# Patient Record
Sex: Male | Born: 1960 | Race: Black or African American | Hispanic: No | Marital: Single | Smoking: Former smoker
Health system: Southern US, Community
[De-identification: ages and names within clinical notes are randomized; demographics above are authoritative.]

## PROBLEM LIST (undated history)

## (undated) DIAGNOSIS — Z9289 Personal history of other medical treatment: Secondary | ICD-10-CM

## (undated) DIAGNOSIS — E1351 Other specified diabetes mellitus with diabetic peripheral angiopathy without gangrene: Secondary | ICD-10-CM

## (undated) DIAGNOSIS — I6529 Occlusion and stenosis of unspecified carotid artery: Secondary | ICD-10-CM

## (undated) DIAGNOSIS — C801 Malignant (primary) neoplasm, unspecified: Secondary | ICD-10-CM

## (undated) DIAGNOSIS — R531 Weakness: Secondary | ICD-10-CM

## (undated) DIAGNOSIS — E785 Hyperlipidemia, unspecified: Secondary | ICD-10-CM

## (undated) DIAGNOSIS — K315 Obstruction of duodenum: Secondary | ICD-10-CM

## (undated) DIAGNOSIS — I429 Cardiomyopathy, unspecified: Secondary | ICD-10-CM

## (undated) DIAGNOSIS — I2510 Atherosclerotic heart disease of native coronary artery without angina pectoris: Secondary | ICD-10-CM

## (undated) DIAGNOSIS — I5042 Chronic combined systolic (congestive) and diastolic (congestive) heart failure: Secondary | ICD-10-CM

## (undated) DIAGNOSIS — I10 Essential (primary) hypertension: Secondary | ICD-10-CM

## (undated) HISTORY — DX: Hyperlipidemia, unspecified: E78.5

## (undated) HISTORY — DX: Chronic combined systolic (congestive) and diastolic (congestive) heart failure: I50.42

## (undated) HISTORY — DX: Personal history of other medical treatment: Z92.89

## (undated) HISTORY — DX: Other specified diabetes mellitus with diabetic peripheral angiopathy without gangrene: E13.51

## (undated) HISTORY — DX: Cardiomyopathy, unspecified: I42.9

## (undated) HISTORY — PX: MULTIPLE TOOTH EXTRACTIONS: SHX2053

## (undated) HISTORY — DX: Occlusion and stenosis of unspecified carotid artery: I65.29

---

## 1997-08-20 ENCOUNTER — Other Ambulatory Visit: Admission: RE | Admit: 1997-08-20 | Discharge: 1997-08-20 | Payer: Self-pay | Admitting: Family Medicine

## 1997-08-23 ENCOUNTER — Emergency Department (HOSPITAL_COMMUNITY): Admission: EM | Admit: 1997-08-23 | Discharge: 1997-08-23 | Payer: Self-pay | Admitting: Emergency Medicine

## 2004-06-21 ENCOUNTER — Emergency Department (HOSPITAL_COMMUNITY): Admission: EM | Admit: 2004-06-21 | Discharge: 2004-06-21 | Payer: Self-pay | Admitting: Emergency Medicine

## 2012-01-16 ENCOUNTER — Encounter (HOSPITAL_COMMUNITY): Payer: Self-pay | Admitting: *Deleted

## 2012-01-16 ENCOUNTER — Emergency Department (HOSPITAL_COMMUNITY)
Admission: EM | Admit: 2012-01-16 | Discharge: 2012-01-16 | Disposition: A | Discharge status: Home / Self Care | Payer: BC Managed Care – PPO | Source: Home / Self Care

## 2012-01-16 DIAGNOSIS — R51 Headache: Secondary | ICD-10-CM

## 2012-01-16 LAB — POCT I-STAT, CHEM 8
HCT: 49 % (ref 39.0–52.0)
Hemoglobin: 16.7 g/dL (ref 13.0–17.0)
Sodium: 140 mEq/L (ref 135–145)
TCO2: 23 mmol/L (ref 0–100)

## 2012-01-16 NOTE — ED Notes (Signed)
Pt is  A  Diabetic  He  Reports  Symptoms  Of  Headache  As  Well  As   intermttiant  dizzyness     He  Reports  Symptoms  Off  And  On  For  sev  Months      He   Reports  He  Vomited 2  Days  Ago  He  denys  Any   Chest pain or  Any  Shortness  Of  Breath  He  Says  He  Took  His  meds  Today  But  Not  yesterday

## 2015-02-03 ENCOUNTER — Encounter (HOSPITAL_BASED_OUTPATIENT_CLINIC_OR_DEPARTMENT_OTHER): Payer: Self-pay | Attending: Internal Medicine

## 2015-05-10 ENCOUNTER — Encounter (HOSPITAL_COMMUNITY): Payer: Self-pay | Admitting: *Deleted

## 2015-05-10 ENCOUNTER — Emergency Department (HOSPITAL_COMMUNITY)
Admission: EM | Admit: 2015-05-10 | Discharge: 2015-05-10 | Disposition: A | Discharge status: Home / Self Care | Payer: BLUE CROSS/BLUE SHIELD | Source: Home / Self Care | Attending: Emergency Medicine | Admitting: Emergency Medicine

## 2015-05-10 DIAGNOSIS — L03115 Cellulitis of right lower limb: Secondary | ICD-10-CM | POA: Diagnosis not present

## 2015-05-10 MED ORDER — DOXYCYCLINE HYCLATE 100 MG PO CAPS: 100.0000 mg | ORAL_CAPSULE | Freq: Two times a day (BID) | ORAL | Status: DC

## 2015-05-10 MED ORDER — AMOXICILLIN 500 MG PO CAPS: 500.0000 mg | ORAL_CAPSULE | Freq: Three times a day (TID) | ORAL | Status: DC

## 2015-05-10 MED ORDER — TRAMADOL HCL 50 MG PO TABS: 50.0000 mg | ORAL_TABLET | Freq: Four times a day (QID) | ORAL | Status: DC | PRN

## 2015-05-10 NOTE — ED Notes (Signed)
Legs   Are  Swollen    Blisters      And  Weeping       History   Of     Peripheral      Neuropathy           Symptoms  Of  Swelling   Have  Been  Present  For  A  While  But the  Blisters    Have  Been there  For  About  3  Days        pt is  A  Diabetic

## 2015-05-10 NOTE — ED Provider Notes (Signed)
CSN: [id number]     Arrival date & time 05/10/15  1321 History   First MD Initiated Contact with Patient 05/10/15 1408     Chief Complaint  Patient presents with  . Leg Swelling   (Consider location/radiation/quality/duration/timing/severity/associated sxs/prior Treatment) HPI  He is a 55 year old man here for evaluation of right foot swelling. He states he has had some problems with swelling of his feet since starting Lyrica about 6 weeks ago. In the last 2 days he has developed increased pain and blistering to the right dorsal ankle and foot. He also has some linear scabbing on the foot, but states this has been present for about 6 weeks. No fevers or chills. He is taking Cipro for a chronic left calf wound, that he states is healing. He has been on the Cipro he says for at least 3 months.  Past Medical History  Diagnosis Date  . Diabetes mellitus    History reviewed. No pertinent past surgical history. History reviewed. No pertinent family history. Social History  Substance Use Topics  . Smoking status: Current Every Day Smoker  . Smokeless tobacco: None  . Alcohol Use: No    Review of Systems As in history of present illness Allergies  Review of patient's allergies indicates no known allergies.  Home Medications   Prior to Admission medications   Medication Sig Start Date End Date Taking? Authorizing Provider  ciprofloxacin (CIPRO) 500 MG tablet Take 500 mg by mouth 2 (two) times daily.   Yes Historical Provider, MD  furosemide (LASIX) 20 MG tablet Take 20 mg by mouth.   Yes Historical Provider, MD  glipiZIDE (GLUCOTROL) 5 MG tablet Take by mouth daily before breakfast.   Yes Historical Provider, MD  metFORMIN (GLUCOPHAGE) 1000 MG tablet Take 1,000 mg by mouth 2 (two) times daily with a meal.   Yes Historical Provider, MD  amoxicillin (AMOXIL) 500 MG capsule Take 1 capsule (500 mg total) by mouth 3 (three) times daily. 05/10/15   Melony Overly, MD  doxycycline (VIBRAMYCIN) 100  MG capsule Take 1 capsule (100 mg total) by mouth 2 (two) times daily. 05/10/15   Melony Overly, MD  traMADol (ULTRAM) 50 MG tablet Take 1 tablet (50 mg total) by mouth every 6 (six) hours as needed. 05/10/15   Melony Overly, MD   Meds Ordered and Administered this Visit  Medications - No data to display  BP 147/79 mmHg  Pulse 94  Temp(Src) 97.4 F (36.3 C) (Oral)  Resp 18  SpO2 98% No data found.   Physical Exam  Constitutional: He is oriented to person, place, and time. He appears well-developed and well-nourished. No distress.  Cardiovascular: Normal rate.   Pulmonary/Chest: Effort normal.  Musculoskeletal: He exhibits edema (in bilateral feet, worse on the right.).  Neurological: He is alert and oriented to person, place, and time.  Skin:  He has a 4 x 4 centimeter patch of blistering and weeping on the anterior ankle and dorsal foot. He does have linear scabbing over the dorsal foot, that appear to be stretch marks. There is erythema and warmth that extends to mid shin. No significant swelling in the leg. I am unable to palpate pulses due to the swelling, but he does have brisk cap refill.    ED Course  Procedures (including critical care time)  Labs Review Labs Reviewed - No data to display  Imaging Review No results found.   MDM   1. Cellulitis of right foot  This is consistent with cellulitis. Will have him continue the Cipro as I am unsure why he is almost medicine. It may be due to osteomyelitis in the left leg. Add amoxicillin and doxycycline for 1 week. Tramadol as needed for pain. If this is not improving in 2 days, he will return for follow-up.    Melony Overly, MD 05/10/15 [phone]

## 2015-05-10 NOTE — Discharge Instructions (Signed)
You have an infection of the skin of your right leg. Take amoxicillin 3 times a day for 1 week. Take Doxycycline twice a day for 1 week. Please continue the Cipro as prescribed. Use tramadol every 6-8 hours as needed for pain. If this is not improving by Thursday, please come back.

## 2015-05-26 ENCOUNTER — Emergency Department (INDEPENDENT_AMBULATORY_CARE_PROVIDER_SITE_OTHER)
Admission: EM | Admit: 2015-05-26 | Discharge: 2015-05-26 | Disposition: A | Discharge status: Home / Self Care | Payer: BLUE CROSS/BLUE SHIELD | Source: Home / Self Care | Attending: Family Medicine | Admitting: Family Medicine

## 2015-05-26 ENCOUNTER — Emergency Department (INDEPENDENT_AMBULATORY_CARE_PROVIDER_SITE_OTHER): Payer: BLUE CROSS/BLUE SHIELD

## 2015-05-26 ENCOUNTER — Encounter (HOSPITAL_COMMUNITY): Payer: Self-pay | Admitting: Emergency Medicine

## 2015-05-26 DIAGNOSIS — M79671 Pain in right foot: Secondary | ICD-10-CM

## 2015-05-26 LAB — GLUCOSE, CAPILLARY: GLUCOSE-CAPILLARY: 93 mg/dL (ref 65–99)

## 2015-05-26 MED ORDER — DOXYCYCLINE HYCLATE 100 MG PO CAPS: 100.0000 mg | ORAL_CAPSULE | Freq: Two times a day (BID) | ORAL | Status: DC

## 2015-05-26 MED ORDER — TRAMADOL HCL 50 MG PO TABS: 50.0000 mg | ORAL_TABLET | Freq: Four times a day (QID) | ORAL | Status: DC | PRN

## 2015-05-26 NOTE — ED Provider Notes (Signed)
CSN: [id number]     Arrival date & time 05/26/15  1637 History   First MD Initiated Contact with Patient 05/26/15 1855     Chief Complaint  Patient presents with  . Foot Pain   (Consider location/radiation/quality/duration/timing/severity/associated sxs/prior Treatment) Patient is a 55 y.o. male presenting with lower extremity pain. The history is provided by the patient. No language interpreter was used.  Foot Pain This is a new problem. The problem occurs constantly. The problem has been gradually worsening. Pertinent negatives include no shortness of breath. Nothing aggravates the symptoms. Nothing relieves the symptoms. The treatment provided no relief.    Past Medical History  Diagnosis Date  . Diabetes mellitus    History reviewed. No pertinent past surgical history. No family history on file. Social History  Substance Use Topics  . Smoking status: Current Every Day Smoker  . Smokeless tobacco: None  . Alcohol Use: No    Review of Systems  Respiratory: Negative for shortness of breath.   All other systems reviewed and are negative.   Allergies  Review of patient's allergies indicates no known allergies.  Home Medications   Prior to Admission medications   Medication Sig Start Date End Date Taking? Authorizing Provider  amoxicillin (AMOXIL) 500 MG capsule Take 1 capsule (500 mg total) by mouth 3 (three) times daily. 05/10/15   Melony Overly, MD  ciprofloxacin (CIPRO) 500 MG tablet Take 500 mg by mouth 2 (two) times daily.    Historical Provider, MD  doxycycline (VIBRAMYCIN) 100 MG capsule Take 1 capsule (100 mg total) by mouth 2 (two) times daily. 05/26/15   Fransico Meadow, PA-C  furosemide (LASIX) 20 MG tablet Take 20 mg by mouth.    Historical Provider, MD  glipiZIDE (GLUCOTROL) 5 MG tablet Take by mouth daily before breakfast.    Historical Provider, MD  metFORMIN (GLUCOPHAGE) 1000 MG tablet Take 1,000 mg by mouth 2 (two) times daily with a meal.    Historical Provider,  MD  traMADol (ULTRAM) 50 MG tablet Take 1 tablet (50 mg total) by mouth every 6 (six) hours as needed. 05/26/15   Fransico Meadow, PA-C   Meds Ordered and Administered this Visit  Medications - No data to display  BP 110/62 mmHg  Pulse 74  Temp(Src) 97.9 F (36.6 C) (Oral)  Resp 16  SpO2 100% No data found.   Physical Exam  Constitutional: He is oriented to person, place, and time. He appears well-developed and well-nourished.  HENT:  Head: Normocephalic.  Eyes: EOM are normal.  Neck: Normal range of motion.  Pulmonary/Chest: Effort normal.  Abdominal: He exhibits no distension.  Musculoskeletal: He exhibits tenderness.  Redness right dorsal  foot, healing wound No pulse with doppler,  (Dr. Bridgett Larsson documented lack of pulse last visit.  Positive cap refill    Neurological: He is alert and oriented to person, place, and time.  Psychiatric: He has a normal mood and affect.  Nursing note and vitals reviewed.   ED Course  Procedures (including critical care time)  Labs Review Labs Reviewed  GLUCOSE, CAPILLARY    Imaging Review Dg Foot Complete Right  05/26/2015  CLINICAL DATA:  Per pt: for a month there is a skin infection on the top and side and on the ankle of the right foot. Skin irritation on the right foot, superior aspect, lateral with irritation extending to the ankle. No injury. Patient is a diabetic EXAM: RIGHT FOOT COMPLETE - 3+ VIEW COMPARISON:  None. FINDINGS: No  fracture. There are no areas of bone resorption to suggest osteomyelitis. Joints are normally spaced and aligned. There is mild diffuse soft tissue edema most evident of the forefoot. No soft tissue air. Small dorsal plantar calcaneal spurs are noted. IMPRESSION: 1. No fracture. No significant joint abnormality. No evidence of osteomyelitis. Electronically Signed   By: Lajean Manes M.D.   On: 05/26/2015 20:00     Visual Acuity Review  Right Eye Distance:   Left Eye Distance:   Bilateral Distance:    Right  Eye Near:   Left Eye Near:    Bilateral Near:         MDM   1. Foot pain, right    Meds ordered this encounter  Medications  . doxycycline (VIBRAMYCIN) 100 MG capsule    Sig: Take 1 capsule (100 mg total) by mouth 2 (two) times daily.    Dispense:  14 capsule    Refill:  0    Order Specific Question:  Supervising Provider    Answer:  Billy Fischer [phone]  . traMADol (ULTRAM) 50 MG tablet    Sig: Take 1 tablet (50 mg total) by mouth every 6 (six) hours as needed.    Dispense:  15 tablet    Refill:  0    Order Specific Question:  Supervising Provider    Answer:  Billy Fischer [phone]   Schedule to see Dr. Jonelle Sidle for evaluation.   You need to have evaluation of your diabetes, your foot wound and the vascular status of your feet.  [place], PA-C 05/26/15 2117

## 2015-05-26 NOTE — Discharge Instructions (Signed)
Diabetes and Foot Care Diabetes may cause you to have problems because of poor blood supply (circulation) to your feet and legs. This may cause the skin on your feet to become thinner, break easier, and heal more slowly. Your skin may become dry, and the skin may peel and crack. You may also have nerve damage in your legs and feet causing decreased feeling in them. You may not notice minor injuries to your feet that could lead to infections or more serious problems. Taking care of your feet is one of the most important things you can do for yourself.  HOME CARE INSTRUCTIONS  Wear shoes at all times, even in the house. Do not go barefoot. Bare feet are easily injured.  Check your feet daily for blisters, cuts, and redness. If you cannot see the bottom of your feet, use a mirror or ask someone for help.  Wash your feet with warm water (do not use hot water) and mild soap. Then pat your feet and the areas between your toes until they are completely dry. Do not soak your feet as this can dry your skin.  Apply a moisturizing lotion or petroleum jelly (that does not contain alcohol and is unscented) to the skin on your feet and to dry, brittle toenails. Do not apply lotion between your toes.  Trim your toenails straight across. Do not dig under them or around the cuticle. File the edges of your nails with an emery board or nail file.  Do not cut corns or calluses or try to remove them with medicine.  Wear clean socks or stockings every day. Make sure they are not too tight. Do not wear knee-high stockings since they may decrease blood flow to your legs.  Wear shoes that fit properly and have enough cushioning. To break in new shoes, wear them for just a few hours a day. This prevents you from injuring your feet. Always look in your shoes before you put them on to be sure there are no objects inside.  Do not cross your legs. This may decrease the blood flow to your feet.  If you find a minor scrape,  cut, or break in the skin on your feet, keep it and the skin around it clean and dry. These areas may be cleansed with mild soap and water. Do not cleanse the area with peroxide, alcohol, or iodine.  When you remove an adhesive bandage, be sure not to damage the skin around it.  If you have a wound, look at it several times a day to make sure it is healing.  Do not use heating pads or hot water bottles. They may burn your skin. If you have lost feeling in your feet or legs, you may not know it is happening until it is too late.  Make sure your health care provider performs a complete foot exam at least annually or more often if you have foot problems. Report any cuts, sores, or bruises to your health care provider immediately. SEEK MEDICAL CARE IF:   You have an injury that is not healing.  You have cuts or breaks in the skin.  You have an ingrown nail.  You notice redness on your legs or feet.  You feel burning or tingling in your legs or feet.  You have pain or cramps in your legs and feet.  Your legs or feet are numb.  Your feet always feel cold. SEEK IMMEDIATE MEDICAL CARE IF:   There is increasing redness,   swelling, or pain in or around a wound.  There is a red line that goes up your leg.  Pus is coming from a wound.  You develop a fever or as directed by your health care provider.  You notice a bad smell coming from an ulcer or wound.   This information is not intended to replace advice given to you by your health care provider. Make sure you discuss any questions you have with your health care provider.   Document Released: 03/30/2000 Document Revised: 12/03/2012 Document Reviewed: 09/09/2012 Elsevier Interactive Patient Education 2016 [employer]  

## 2015-05-26 NOTE — ED Notes (Signed)
Complains of right foot pain.  Seen at [facility] 1/24 for a cellulitis.  Patient reports right foot wound is looking better per patient.  Patient also says swelling is better.  But patient reports pain is worsening.  Unable to palpate or doppler pulse in right foot.  Patient pulse in left foot is unable to doppler.  Toes numb in right foot, but not new per patient.  Both feet cool to touch.  Right foot is still slightly red and swollen.

## 2015-05-31 ENCOUNTER — Other Ambulatory Visit: Payer: Self-pay

## 2015-05-31 ENCOUNTER — Ambulatory Visit (INDEPENDENT_AMBULATORY_CARE_PROVIDER_SITE_OTHER): Payer: BLUE CROSS/BLUE SHIELD | Admitting: Medical

## 2015-05-31 ENCOUNTER — Encounter: Payer: Self-pay | Admitting: Vascular Surgery

## 2015-05-31 ENCOUNTER — Ambulatory Visit (INDEPENDENT_AMBULATORY_CARE_PROVIDER_SITE_OTHER)
Admission: RE | Admit: 2015-05-31 | Discharge: 2015-05-31 | Disposition: A | Discharge status: Home / Self Care | Payer: BLUE CROSS/BLUE SHIELD | Source: Ambulatory Visit | Attending: Vascular Surgery | Admitting: Vascular Surgery

## 2015-05-31 ENCOUNTER — Encounter: Payer: Self-pay | Admitting: Medical

## 2015-05-31 ENCOUNTER — Ambulatory Visit (INDEPENDENT_AMBULATORY_CARE_PROVIDER_SITE_OTHER): Payer: BLUE CROSS/BLUE SHIELD | Admitting: Vascular Surgery

## 2015-05-31 ENCOUNTER — Telehealth: Payer: Self-pay

## 2015-05-31 VITALS — BP 128/80 | HR 88 | Wt 178.0 lb

## 2015-05-31 VITALS — BP 123/66 | HR 76 | Temp 97.5°F | Resp 16 | Ht 71.0 in | Wt 175.0 lb

## 2015-05-31 DIAGNOSIS — I739 Peripheral vascular disease, unspecified: Secondary | ICD-10-CM | POA: Diagnosis not present

## 2015-05-31 DIAGNOSIS — E1140 Type 2 diabetes mellitus with diabetic neuropathy, unspecified: Secondary | ICD-10-CM | POA: Diagnosis not present

## 2015-05-31 DIAGNOSIS — L97929 Non-pressure chronic ulcer of unspecified part of left lower leg with unspecified severity: Secondary | ICD-10-CM | POA: Diagnosis not present

## 2015-05-31 DIAGNOSIS — R0989 Other specified symptoms and signs involving the circulatory and respiratory systems: Secondary | ICD-10-CM

## 2015-05-31 DIAGNOSIS — L97519 Non-pressure chronic ulcer of other part of right foot with unspecified severity: Secondary | ICD-10-CM | POA: Diagnosis not present

## 2015-05-31 DIAGNOSIS — Z720 Tobacco use: Secondary | ICD-10-CM | POA: Diagnosis not present

## 2015-05-31 DIAGNOSIS — E08621 Diabetes mellitus due to underlying condition with foot ulcer: Secondary | ICD-10-CM | POA: Diagnosis not present

## 2015-05-31 DIAGNOSIS — R231 Pallor: Secondary | ICD-10-CM

## 2015-05-31 DIAGNOSIS — E1152 Type 2 diabetes mellitus with diabetic peripheral angiopathy with gangrene: Secondary | ICD-10-CM | POA: Insufficient documentation

## 2015-05-31 DIAGNOSIS — E1151 Type 2 diabetes mellitus with diabetic peripheral angiopathy without gangrene: Secondary | ICD-10-CM

## 2015-05-31 DIAGNOSIS — R938 Abnormal findings on diagnostic imaging of other specified body structures: Secondary | ICD-10-CM | POA: Insufficient documentation

## 2015-05-31 DIAGNOSIS — E785 Hyperlipidemia, unspecified: Secondary | ICD-10-CM

## 2015-05-31 DIAGNOSIS — F17200 Nicotine dependence, unspecified, uncomplicated: Secondary | ICD-10-CM

## 2015-05-31 DIAGNOSIS — L909 Atrophic disorder of skin, unspecified: Secondary | ICD-10-CM

## 2015-05-31 DIAGNOSIS — I771 Stricture of artery: Secondary | ICD-10-CM

## 2015-05-31 DIAGNOSIS — R238 Other skin changes: Secondary | ICD-10-CM

## 2015-05-31 DIAGNOSIS — M79671 Pain in right foot: Secondary | ICD-10-CM

## 2015-05-31 DIAGNOSIS — Z794 Long term (current) use of insulin: Secondary | ICD-10-CM

## 2015-05-31 NOTE — Progress Notes (Addendum)
Subjective: Chief Complaint  Patient presents with  . Foot Pain    rt foot pain and skin disease. has poor circulation in his feet he was told.   . New Patient (Initial Visit)    has been going to urgent care.    Here as a new patient today.   Was seeing Dr. Dorita Sciara in the past.  has 2 month hx/o right foot circulation and skin problems.  Has been seeing urgent care.   Denies injury to foot.   Was put on Lyrica a month ago and since then he has been having swelling of right foot, skin changes, and pain.  No fever.   No recent drainage.     Dr. Dorita Sciara was seeing him for diabetes.   Last HgbA1C was 43mo ago, a little high.  Takes Metformin 1000mg  BID, Glipizide 5mg  BID.   Checks sugar in the past but not recently.    He is a smoker x 35 + years, typically 1 ppd.  Past Medical History  Diagnosis Date  . Diabetes mellitus   . Hyperlipidemia   . Femoral bruit   . Peripheral vascular disease due to secondary diabetes mellitus (HCC)    ROS as in subjective  Objective: BP 128/80 mmHg  Pulse 88  Wt 178 lb (80.74 kg)  Gen: wdwn, nad, pleasant AA male Skin: right leg cool in general compared to left, right foot with dorsal and lateral skin peeling, some brown dead superficial skin, pink and pale skin underneath area of skin flaking, approximately 10cm x 8cm area of concern, right foot is generally swollen, there is also pinkish/pale coloration of several toes.  Left posterolateral leg mid shaft with 14mm depth, 1.5 cm diameter ulcer, there are several brown round old scars throughout both legs suggestive of prior ulcerations. Heart RRR normal S1, S2, no murmurs lungs clear Bruits present mid abdominal and bilat femoral Absent pedal pulses See separate foot exam   Adult ECG Report  Indication: bruits, absent pedal pulses  Rate: 85 bpm  Rhythm: normal sinus rhythm  QRS Axis: 0 degrees  PR Interval: 148ms  QRS Duration: 68ms  QTc: 48ms  Conduction Disturbances: T wave inversions I, II,  V4-V6  Other Abnormalities: none  Patient's cardiac risk factors are: advanced age (older than 64 for men, 74 for women), diabetes mellitus, dyslipidemia, male gender and smoking/ tobacco exposure.  EKG comparison: none  Narrative Interpretation: abnormal EKG worrisome for ischemic changes     Diabetic Foot Exam - Simple   Simple Foot Form  Visual Inspection  See comments:  Yes  Sensation Testing  See comments:  Yes  Pulse Check  See comments:  Yes  Comments  Decreased monofilament testing throughout both feet, absent pedal pulses, skin breakdown of right dorsal and lateral foot, see skin exam above        Assessment: Encounter Diagnoses  Name Primary?  . DM (diabetes mellitus), type 2 with peripheral vascular complications (HCC) Yes  . Femoral bruit   . Smoker   . Leg ulcer, left, with unspecified severity ([place])   . Skin breakdown   . Right foot pain   . Type 2 diabetes mellitus with diabetic neuropathy, without long-term current use of insulin (HCC)     Plan: Discussed his exam findings, EKG findings, reviewed case with supervising physician Dr. Redmond School as well.  Major concern for atherosclerosis, T wave inversions and ischemic findings on EKG, absent pedal pulses, cool right leg with skin breakdown and prior scars from  ulcerations as well.        Will request recent labs from his prior PCP  I called and got him appt with vascular surgery today with imaging and then appt with Dr. Kellie Simmering.  Discussed the need to get vascularization to leg urgently and then we will see him back soon to work on diabetes and medication management.   Advised he needs to stop tobacco TODAY!

## 2015-05-31 NOTE — Progress Notes (Signed)
Subjective:     Patient ID: Donald Steele, male   DOB: 1961/04/04, 55 y.o.   MRN: [id number]  HPI this 55 year old male was referred for urgent evaluation today for ischemic ulcer and rest pain right lower extremity. He was referred by Chana Bode PA. Patient has history of bilateral calf claudication limiting him to less than 1 block. He developed an ulceration of his right foot a month or so ago and that has gradually worsened. He does have a long history of tobacco abuse greater than 1 pack per day for 40 years. He denies any history of coronary artery disease or cardiac symptoms. He does have type 2 diabetes mellitus. He has no history of left leg rest pain.   Past Medical History  Diagnosis Date  . Diabetes mellitus   . Hyperlipidemia   . Femoral bruit   . Peripheral vascular disease due to secondary diabetes mellitus [facility])     Social History  Substance Use Topics  . Smoking status: Current Every Day Smoker -- 1.00 packs/day for 35 years  . Smokeless tobacco: Not on file  . Alcohol Use: No    No family history on file.  No Known Allergies   Current outpatient prescriptions:  .  amoxicillin (AMOXIL) 500 MG capsule, Take 1 capsule (500 mg total) by mouth 3 (three) times daily., Disp: 21 capsule, Rfl: 0 .  doxycycline (VIBRAMYCIN) 100 MG capsule, Take 1 capsule (100 mg total) by mouth 2 (two) times daily., Disp: 14 capsule, Rfl: 0 .  furosemide (LASIX) 20 MG tablet, Take 20 mg by mouth., Disp: , Rfl:  .  glipiZIDE (GLUCOTROL) 5 MG tablet, Take by mouth daily before breakfast., Disp: , Rfl:  .  metFORMIN (GLUCOPHAGE) 1000 MG tablet, Take 1,000 mg by mouth 2 (two) times daily with a meal., Disp: , Rfl:  .  traMADol (ULTRAM) 50 MG tablet, Take 1 tablet (50 mg total) by mouth every 6 (six) hours as needed., Disp: 15 tablet, Rfl: 0  Filed Vitals:   05/31/15 1509  BP: 123/66  Pulse: 76  Temp: 97.5 F (36.4 C)  TempSrc: Oral  Resp: 16  Height: 5\' 11"  (1.803 m)  Weight: 175 lb  (79.379 kg)  SpO2: 100%    Body mass index is 24.42 kg/(m^2).           Review of Systems Denies chest pain, dyspnea on exertion, PND, orthopnea, hemoptysis, lateralizing weakness,   aphasia, amaurosis fugax, diplopia, blurred vision, syncope. All other systems negative and complete review of systems     Objective:   Physical Exam BP 123/66 mmHg  Pulse 76  Temp(Src) 97.5 F (36.4 C) (Oral)  Resp 16  Ht 5\' 11"  (1.803 m)  Wt 175 lb (79.379 kg)  BMI 24.42 kg/m2  SpO2 100%  Gen.-alert and oriented x3 in no apparent distress HEENT normal for age Lungs no rhonchi or wheezing Cardiovascular regular rhythm no murmurs carotid pulses 3+ palpable  -bilateral carotid bruits right louder than left Abdomen soft nontender no palpable masses Musculoskeletal free of  major deformities Skin clear -no rashes Neurologic normal Lower extremities 3+ femoral  Pulses palpable bilaterally. No popliteal or distal pulses palpable. Right foot has ulceration on dorsal aspect over extensor tendons measuring about 3 cm in diameter with no tendons exposed. Also eschar on the lateral anterior aspect of the right foot. Does have decreased sensation bilaterally in the feet right worse than left. No calf tenderness. Today I ordered ABIs and a duplex scan of the  right leg which I reviewed and interpreted. Patient has total occlusion of the right SFA with multiple areas of stenosis down to the tibioperoneal trunk with patent tibial vessels. ABIs reveal 0.38 on the right and 0.54 on the left with monophasic flow       Assessment:      #1 rest ischemia right leg with limb threatening ulceration right foot due to superficial femoral-popliteal and tibial occlusive disease  #2 long history of tobacco abuseongoing40+ pack years #3 diabetes mellitus type 2     Plan:      patient needs angiography as soon as possible with attempted revascularization if feasible 2 achieve limb salvage We'll plan angiography  tomorrow to see if he is candidate for bypass If so we'll proceed with femoral-popliteal or tibial bypass grafting either Thursday or Friday of this week  Also discussed with him the importance of complete tobacco cessation which she states he will do following the surgery

## 2015-05-31 NOTE — Telephone Encounter (Signed)
rec'd phone call from Dorothea Ogle, [place] with request to work pt. In to office today.  Reported a 1 mo. Hx of cool right leg, swelling, absence of pulse, and skin breakdown of right foot.  Also reported a bruit in femoral artery and abdominal aorta.  Stated pt. needs evaluation today.  Discussed pt's symptoms with Dr. Kellie Simmering.  Gave verb. Order for ABI's, and Right LE Arterial Duplex, and office appt. Today.  Notified S. Tysinger, PA,  of appt. today at 1:00 PM.  Agreed; will inform pt.

## 2015-06-01 ENCOUNTER — Inpatient Hospital Stay (HOSPITAL_COMMUNITY)
Admission: AD | Admit: 2015-06-01 | Discharge: 2015-06-07 | DRG: 253 | Disposition: A | Discharge status: Home Health | Payer: BLUE CROSS/BLUE SHIELD | Source: Ambulatory Visit | Attending: Vascular Surgery | Admitting: Vascular Surgery

## 2015-06-01 ENCOUNTER — Telehealth: Payer: Self-pay | Admitting: Medical

## 2015-06-01 ENCOUNTER — Encounter (HOSPITAL_COMMUNITY)
Admission: AD | Disposition: A | Discharge status: Home Health | Payer: Self-pay | Source: Ambulatory Visit | Attending: Vascular Surgery

## 2015-06-01 ENCOUNTER — Ambulatory Visit (HOSPITAL_COMMUNITY): Payer: BLUE CROSS/BLUE SHIELD

## 2015-06-01 ENCOUNTER — Ambulatory Visit (HOSPITAL_COMMUNITY)
Admission: RE | Admit: 2015-06-01 | Discharge: 2015-06-01 | Disposition: A | Discharge status: Home / Self Care | Payer: BLUE CROSS/BLUE SHIELD | Source: Ambulatory Visit | Attending: Vascular Surgery | Admitting: Vascular Surgery

## 2015-06-01 ENCOUNTER — Inpatient Hospital Stay (HOSPITAL_COMMUNITY): Payer: BLUE CROSS/BLUE SHIELD

## 2015-06-01 DIAGNOSIS — Z419 Encounter for procedure for purposes other than remedying health state, unspecified: Secondary | ICD-10-CM

## 2015-06-01 DIAGNOSIS — F17200 Nicotine dependence, unspecified, uncomplicated: Secondary | ICD-10-CM | POA: Diagnosis present

## 2015-06-01 DIAGNOSIS — E11621 Type 2 diabetes mellitus with foot ulcer: Secondary | ICD-10-CM | POA: Diagnosis present

## 2015-06-01 DIAGNOSIS — I70235 Atherosclerosis of native arteries of right leg with ulceration of other part of foot: Secondary | ICD-10-CM

## 2015-06-01 DIAGNOSIS — I420 Dilated cardiomyopathy: Secondary | ICD-10-CM | POA: Diagnosis present

## 2015-06-01 DIAGNOSIS — I10 Essential (primary) hypertension: Secondary | ICD-10-CM | POA: Diagnosis present

## 2015-06-01 DIAGNOSIS — L97409 Non-pressure chronic ulcer of unspecified heel and midfoot with unspecified severity: Secondary | ICD-10-CM | POA: Diagnosis present

## 2015-06-01 DIAGNOSIS — F17210 Nicotine dependence, cigarettes, uncomplicated: Secondary | ICD-10-CM | POA: Diagnosis present

## 2015-06-01 DIAGNOSIS — Z01810 Encounter for preprocedural cardiovascular examination: Secondary | ICD-10-CM

## 2015-06-01 DIAGNOSIS — Z794 Long term (current) use of insulin: Secondary | ICD-10-CM

## 2015-06-01 DIAGNOSIS — R9431 Abnormal electrocardiogram [ECG] [EKG]: Secondary | ICD-10-CM | POA: Diagnosis not present

## 2015-06-01 DIAGNOSIS — Z9889 Other specified postprocedural states: Secondary | ICD-10-CM

## 2015-06-01 DIAGNOSIS — I70211 Atherosclerosis of native arteries of extremities with intermittent claudication, right leg: Secondary | ICD-10-CM | POA: Diagnosis present

## 2015-06-01 DIAGNOSIS — R0989 Other specified symptoms and signs involving the circulatory and respiratory systems: Secondary | ICD-10-CM

## 2015-06-01 DIAGNOSIS — I5042 Chronic combined systolic (congestive) and diastolic (congestive) heart failure: Secondary | ICD-10-CM | POA: Insufficient documentation

## 2015-06-01 DIAGNOSIS — Z720 Tobacco use: Secondary | ICD-10-CM | POA: Diagnosis not present

## 2015-06-01 DIAGNOSIS — E785 Hyperlipidemia, unspecified: Secondary | ICD-10-CM | POA: Diagnosis present

## 2015-06-01 DIAGNOSIS — I7092 Chronic total occlusion of artery of the extremities: Secondary | ICD-10-CM | POA: Diagnosis present

## 2015-06-01 DIAGNOSIS — L97919 Non-pressure chronic ulcer of unspecified part of right lower leg with unspecified severity: Secondary | ICD-10-CM

## 2015-06-01 DIAGNOSIS — Z418 Encounter for other procedures for purposes other than remedying health state: Secondary | ICD-10-CM

## 2015-06-01 DIAGNOSIS — E1151 Type 2 diabetes mellitus with diabetic peripheral angiopathy without gangrene: Secondary | ICD-10-CM | POA: Diagnosis present

## 2015-06-01 DIAGNOSIS — I70201 Unspecified atherosclerosis of native arteries of extremities, right leg: Secondary | ICD-10-CM | POA: Diagnosis present

## 2015-06-01 DIAGNOSIS — L97519 Non-pressure chronic ulcer of other part of right foot with unspecified severity: Secondary | ICD-10-CM | POA: Diagnosis present

## 2015-06-01 DIAGNOSIS — I70229 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, unspecified extremity: Secondary | ICD-10-CM | POA: Diagnosis present

## 2015-06-01 DIAGNOSIS — E1152 Type 2 diabetes mellitus with diabetic peripheral angiopathy with gangrene: Secondary | ICD-10-CM | POA: Diagnosis present

## 2015-06-01 HISTORY — PX: PERIPHERAL VASCULAR CATHETERIZATION: SHX172C

## 2015-06-01 LAB — PROTIME-INR
INR: 1.02 (ref 0.00–1.49)
Prothrombin Time: 13.6 seconds (ref 11.6–15.2)

## 2015-06-01 LAB — COMPREHENSIVE METABOLIC PANEL
ALBUMIN: 3.9 g/dL (ref 3.5–5.0)
ALK PHOS: 72 U/L (ref 38–126)
ALT: 17 U/L (ref 17–63)
ANION GAP: 12 (ref 5–15)
AST: 18 U/L (ref 15–41)
BUN: 7 mg/dL (ref 6–20)
CALCIUM: 9.7 mg/dL (ref 8.9–10.3)
CHLORIDE: 106 mmol/L (ref 101–111)
CO2: 25 mmol/L (ref 22–32)
Creatinine, Ser: 0.82 mg/dL (ref 0.61–1.24)
GFR calc non Af Amer: >60 mL/min (ref >60)
GLUCOSE: 271 mg/dL — AB (ref 65–99)
POTASSIUM: 4.2 mmol/L (ref 3.5–5.1)
SODIUM: 143 mmol/L (ref 135–145)
Total Bilirubin: 0.6 mg/dL (ref 0.3–1.2)
Total Protein: 7.4 g/dL (ref 6.5–8.1)

## 2015-06-01 LAB — POCT I-STAT, CHEM 8
BUN: 9 mg/dL (ref 6–20)
CREATININE: 0.7 mg/dL (ref 0.61–1.24)
Calcium, Ion: 1.17 mmol/L (ref 1.12–1.23)
Chloride: 104 mmol/L (ref 101–111)
GLUCOSE: 176 mg/dL — AB (ref 65–99)
HEMATOCRIT: 41 % (ref 39.0–52.0)
HEMOGLOBIN: 13.9 g/dL (ref 13.0–17.0)
POTASSIUM: 3.8 mmol/L (ref 3.5–5.1)
Sodium: 142 mmol/L (ref 135–145)
TCO2: 25 mmol/L (ref 0–100)

## 2015-06-01 LAB — CBC
HCT: 41.9 % (ref 39.0–52.0)
HEMATOCRIT: 41.2 % (ref 39.0–52.0)
HEMOGLOBIN: 13.5 g/dL (ref 13.0–17.0)
HEMOGLOBIN: 14 g/dL (ref 13.0–17.0)
MCH: 28.7 pg (ref 26.0–34.0)
MCH: 29.4 pg (ref 26.0–34.0)
MCHC: 32.8 g/dL (ref 30.0–36.0)
MCHC: 33.4 g/dL (ref 30.0–36.0)
MCV: 87.7 fL (ref 78.0–100.0)
MCV: 88 fL (ref 78.0–100.0)
PLATELETS: 244 10*3/uL (ref 150–400)
PLATELETS: 232 10*3/uL (ref 150–400)
RBC: 4.7 MIL/uL (ref 4.22–5.81)
RBC: 4.76 MIL/uL (ref 4.22–5.81)
RDW: 12.3 % (ref 11.5–15.5)
RDW: 12.4 % (ref 11.5–15.5)
WBC: 8.2 10*3/uL (ref 4.0–10.5)
WBC: 7.7 10*3/uL (ref 4.0–10.5)

## 2015-06-01 LAB — URINE MICROSCOPIC-ADD ON
BACTERIA UA: NONE SEEN
RBC / HPF: NONE SEEN RBC/hpf (ref 0–5)

## 2015-06-01 LAB — URINALYSIS, ROUTINE W REFLEX MICROSCOPIC
BILIRUBIN URINE: NEGATIVE
HGB URINE DIPSTICK: NEGATIVE
Ketones, ur: NEGATIVE mg/dL
Leukocytes, UA: NEGATIVE
Nitrite: NEGATIVE
PROTEIN: NEGATIVE mg/dL
Specific Gravity, Urine: 1.026 (ref 1.005–1.030)
pH: 6 (ref 5.0–8.0)

## 2015-06-01 LAB — GLUCOSE, CAPILLARY
GLUCOSE-CAPILLARY: 282 mg/dL — AB (ref 65–99)
Glucose-Capillary: 184 mg/dL — ABNORMAL HIGH (ref 65–99)
Glucose-Capillary: 146 mg/dL — ABNORMAL HIGH (ref 65–99)

## 2015-06-01 LAB — APTT: APTT: 32 s (ref 24–37)

## 2015-06-01 LAB — CREATININE, SERUM
Creatinine, Ser: 0.76 mg/dL (ref 0.61–1.24)
GFR calc Af Amer: >60 mL/min (ref >60)
GFR calc non Af Amer: >60 mL/min (ref >60)

## 2015-06-01 LAB — ABO/RH: ABO/RH(D): O POS

## 2015-06-01 LAB — PREPARE RBC (CROSSMATCH)

## 2015-06-01 SURGERY — ABDOMINAL AORTOGRAM
Anesthesia: LOCAL

## 2015-06-01 MED ORDER — OXYCODONE HCL 5 MG PO TABS
5.0000 mg | ORAL_TABLET | ORAL | Status: DC | PRN
  Administered 2015-06-03 – 2015-06-07 (×11): 10 mg via ORAL
  Filled 2015-06-01 (×14): qty 2

## 2015-06-01 MED ORDER — SODIUM CHLORIDE 0.9 % IV SOLN: INTRAVENOUS | Status: DC

## 2015-06-01 MED ORDER — HYDRALAZINE HCL 20 MG/ML IJ SOLN
5.0000 mg | INTRAMUSCULAR | Status: DC | PRN
  Administered 2015-06-02: 5 mg via INTRAVENOUS
  Filled 2015-06-01: qty 1

## 2015-06-01 MED ORDER — MORPHINE SULFATE (PF) 2 MG/ML IV SOLN
1.0000 mg | INTRAVENOUS | Status: DC | PRN
  Administered 2015-06-02: 4 mg via INTRAVENOUS
  Administered 2015-06-02 (×3): 2 mg via INTRAVENOUS
  Administered 2015-06-03: 4 mg via INTRAVENOUS
  Administered 2015-06-03: 2 mg via INTRAVENOUS
  Filled 2015-06-01: qty 1
  Filled 2015-06-01: qty 2
  Filled 2015-06-01 (×3): qty 1
  Filled 2015-06-01: qty 2

## 2015-06-01 MED ORDER — DEXTROSE 5 % IV SOLN
1.5000 g | INTRAVENOUS | Status: DC
  Filled 2015-06-01: qty 1.5

## 2015-06-01 MED ORDER — SODIUM CHLORIDE 0.9 % IV SOLN: 1.0000 mL/kg/h | INTRAVENOUS | Status: DC

## 2015-06-01 MED ORDER — POTASSIUM CHLORIDE CRYS ER 20 MEQ PO TBCR
20.0000 meq | EXTENDED_RELEASE_TABLET | Freq: Once | ORAL | Status: AC
  Administered 2015-06-01: 20 meq via ORAL
  Filled 2015-06-01: qty 1

## 2015-06-01 MED ORDER — LIDOCAINE HCL (PF) 1 % IJ SOLN
INTRAMUSCULAR | Status: AC
  Filled 2015-06-01: qty 30

## 2015-06-01 MED ORDER — SODIUM CHLORIDE 0.9 % IV SOLN
INTRAVENOUS | Status: DC
  Administered 2015-06-01 – 2015-06-02 (×4): via INTRAVENOUS
  Administered 2015-06-03: 125 mL/h via INTRAVENOUS

## 2015-06-01 MED ORDER — HEPARIN (PORCINE) IN NACL 2-0.9 UNIT/ML-% IJ SOLN
INTRAMUSCULAR | Status: AC
  Filled 2015-06-01: qty 1000

## 2015-06-01 MED ORDER — MIDAZOLAM HCL 2 MG/2ML IJ SOLN
INTRAMUSCULAR | Status: AC
  Filled 2015-06-01: qty 2

## 2015-06-01 MED ORDER — CLOBETASOL PROPIONATE 0.05 % EX OINT
1.0000 "application " | TOPICAL_OINTMENT | Freq: Two times a day (BID) | CUTANEOUS | Status: DC
  Administered 2015-06-01 – 2015-06-03 (×2): 1 via TOPICAL
  Filled 2015-06-01 (×2): qty 15

## 2015-06-01 MED ORDER — GUAIFENESIN-DM 100-10 MG/5ML PO SYRP: 15.0000 mL | ORAL_SOLUTION | ORAL | Status: DC | PRN

## 2015-06-01 MED ORDER — ALUM & MAG HYDROXIDE-SIMETH 200-200-20 MG/5ML PO SUSP: 15.0000 mL | ORAL | Status: DC | PRN

## 2015-06-01 MED ORDER — CHLORHEXIDINE GLUCONATE 4 % EX LIQD: 60.0000 mL | Freq: Once | CUTANEOUS | Status: DC

## 2015-06-01 MED ORDER — PANTOPRAZOLE SODIUM 40 MG PO TBEC
40.0000 mg | DELAYED_RELEASE_TABLET | Freq: Every day | ORAL | Status: DC
Start: 2015-06-01 — End: 2015-06-07
  Administered 2015-06-01 – 2015-06-07 (×6): 40 mg via ORAL
  Filled 2015-06-01 (×6): qty 1

## 2015-06-01 MED ORDER — GLIPIZIDE 10 MG PO TABS
10.0000 mg | ORAL_TABLET | Freq: Two times a day (BID) | ORAL | Status: DC
  Administered 2015-06-03 – 2015-06-07 (×9): 10 mg via ORAL
  Filled 2015-06-01 (×5): qty 1
  Filled 2015-06-01: qty 2
  Filled 2015-06-01: qty 1
  Filled 2015-06-01: qty 2
  Filled 2015-06-01: qty 1
  Filled 2015-06-01: qty 2

## 2015-06-01 MED ORDER — PREGABALIN 100 MG PO CAPS
100.0000 mg | ORAL_CAPSULE | Freq: Two times a day (BID) | ORAL | Status: DC
  Administered 2015-06-01 – 2015-06-07 (×11): 100 mg via ORAL
  Filled 2015-06-01 (×10): qty 1

## 2015-06-01 MED ORDER — METOPROLOL TARTRATE 1 MG/ML IV SOLN: 2.0000 mg | INTRAVENOUS | Status: DC | PRN

## 2015-06-01 MED ORDER — LABETALOL HCL 5 MG/ML IV SOLN
10.0000 mg | INTRAVENOUS | Status: DC | PRN
  Administered 2015-06-02: 10 mg via INTRAVENOUS
  Filled 2015-06-01: qty 4

## 2015-06-01 MED ORDER — MORPHINE SULFATE (PF) 10 MG/ML IV SOLN: 1.0000 mg | INTRAVENOUS | Status: DC | PRN

## 2015-06-01 MED ORDER — PHENOL 1.4 % MT LIQD: 1.0000 | OROMUCOSAL | Status: DC | PRN

## 2015-06-01 MED ORDER — ACETAMINOPHEN 325 MG PO TABS
325.0000 mg | ORAL_TABLET | ORAL | Status: DC | PRN
  Administered 2015-06-01: 650 mg via ORAL
  Filled 2015-06-01: qty 2

## 2015-06-01 MED ORDER — LIDOCAINE HCL (PF) 1 % IJ SOLN
INTRAMUSCULAR | Status: DC | PRN
  Administered 2015-06-01: 15 mL

## 2015-06-01 MED ORDER — MORPHINE SULFATE (PF) 10 MG/ML IV SOLN: 2.0000 mg | INTRAVENOUS | Status: DC | PRN

## 2015-06-01 MED ORDER — HEPARIN (PORCINE) IN NACL 2-0.9 UNIT/ML-% IJ SOLN
INTRAMUSCULAR | Status: DC | PRN
  Administered 2015-06-01: 12:00:00

## 2015-06-01 MED ORDER — DEXTROSE 5 % IV SOLN: 1.5000 g | INTRAVENOUS | Status: DC

## 2015-06-01 MED ORDER — FENTANYL CITRATE (PF) 100 MCG/2ML IJ SOLN
INTRAMUSCULAR | Status: AC
  Filled 2015-06-01: qty 2

## 2015-06-01 MED ORDER — FENTANYL CITRATE (PF) 100 MCG/2ML IJ SOLN
INTRAMUSCULAR | Status: DC | PRN
  Administered 2015-06-01: 25 ug via INTRAVENOUS

## 2015-06-01 MED ORDER — DEXTROSE 5 % IV SOLN
1.5000 g | INTRAVENOUS | Status: AC
  Administered 2015-06-02: 1.5 g via INTRAVENOUS

## 2015-06-01 MED ORDER — FUROSEMIDE 20 MG PO TABS
20.0000 mg | ORAL_TABLET | Freq: Every day | ORAL | Status: DC
  Administered 2015-06-01 – 2015-06-07 (×6): 20 mg via ORAL
  Filled 2015-06-01 (×6): qty 1

## 2015-06-01 MED ORDER — MIDAZOLAM HCL 2 MG/2ML IJ SOLN
INTRAMUSCULAR | Status: DC | PRN
  Administered 2015-06-01: 1 mg via INTRAVENOUS

## 2015-06-01 MED ORDER — IODIXANOL 320 MG/ML IV SOLN
INTRAVENOUS | Status: DC | PRN
  Administered 2015-06-01: 155 mL via INTRA_ARTERIAL

## 2015-06-01 MED ORDER — LOSARTAN POTASSIUM 50 MG PO TABS
50.0000 mg | ORAL_TABLET | Freq: Every day | ORAL | Status: DC
  Administered 2015-06-01 – 2015-06-07 (×6): 50 mg via ORAL
  Filled 2015-06-01 (×6): qty 1

## 2015-06-01 MED ORDER — ENOXAPARIN SODIUM 40 MG/0.4ML ~~LOC~~ SOLN
40.0000 mg | SUBCUTANEOUS | Status: DC
  Administered 2015-06-02 – 2015-06-06 (×5): 40 mg via SUBCUTANEOUS
  Filled 2015-06-01 (×5): qty 0.4

## 2015-06-01 MED ORDER — ONDANSETRON HCL 4 MG/2ML IJ SOLN
4.0000 mg | Freq: Four times a day (QID) | INTRAMUSCULAR | Status: DC | PRN
  Administered 2015-06-02: 4 mg via INTRAVENOUS
  Filled 2015-06-01: qty 2

## 2015-06-01 MED ORDER — INSULIN ASPART 100 UNIT/ML ~~LOC~~ SOLN
0.0000 [IU] | Freq: Three times a day (TID) | SUBCUTANEOUS | Status: DC
  Administered 2015-06-02 (×2): 2 [IU] via SUBCUTANEOUS
  Administered 2015-06-03: 3 [IU] via SUBCUTANEOUS
  Administered 2015-06-03: 2 [IU] via SUBCUTANEOUS
  Administered 2015-06-04: 3 [IU] via SUBCUTANEOUS
  Administered 2015-06-04 (×2): 2 [IU] via SUBCUTANEOUS
  Administered 2015-06-05 – 2015-06-06 (×3): 3 [IU] via SUBCUTANEOUS
  Administered 2015-06-06 – 2015-06-07 (×2): 5 [IU] via SUBCUTANEOUS

## 2015-06-01 MED ORDER — ACETAMINOPHEN 325 MG RE SUPP
325.0000 mg | RECTAL | Status: DC | PRN
  Filled 2015-06-01: qty 2

## 2015-06-01 MED ORDER — DOCUSATE SODIUM 100 MG PO CAPS
100.0000 mg | ORAL_CAPSULE | Freq: Every day | ORAL | Status: DC
  Administered 2015-06-03 – 2015-06-07 (×5): 100 mg via ORAL
  Filled 2015-06-01 (×5): qty 1

## 2015-06-01 SURGICAL SUPPLY — 10 items
CATH OMNI FLUSH 5F 65CM (CATHETERS) ×2 IMPLANT
COVER PRB 48X5XTLSCP FOLD TPE (BAG) ×1 IMPLANT
COVER PROBE 5X48 (BAG) ×1
KIT MICROINTRODUCER STIFF 5F (SHEATH) ×2 IMPLANT
KIT PV (KITS) ×2 IMPLANT
SHEATH PINNACLE 5F 10CM (SHEATH) ×2 IMPLANT
SYR MEDRAD MARK V 150ML (SYRINGE) ×2 IMPLANT
TRANSDUCER W/STOPCOCK (MISCELLANEOUS) ×2 IMPLANT
TRAY PV CATH (CUSTOM PROCEDURE TRAY) ×2 IMPLANT
WIRE BENTSON .035X145CM (WIRE) ×2 IMPLANT

## 2015-06-01 NOTE — Progress Notes (Signed)
Anesthesia Chart Review: Patient is a 55 year old male scheduled for right FPBG tomorrow (first case) by Dr. Kellie Simmering. 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   Patient first established with PCP Chana Bode, PA-C on 05/31/15. History includes DM2, HLD, PVD, smoking. He was urgently referred to vascular surgeon Dr. Kellie Simmering for ischemic right foot ulcer and rest pain. Ulceration was felt limb threatening, so angiography arranged for today with attempted revascularization later this week.   05/31/15 EKG: NSR, inferior infarct (age undetermined), ST/T wave abnormality, consider lateral ischemia. Currently no comparison EKGs available. Per PAT RN, patient denied prior cardiac testing. No CV symptoms reported.   06/01/15 1117 labs noted. Other labs are still pending.   Reviewed EKG and history with anesthesiologists Dr. Delma Post and Dr. Ermalene Postin. Recommend preoperative cardiology evaluation. Dr. Kellie Simmering had already planned to admit patient overnight. Cardiology will be consulted to see patient today.   UPDATE: Patient seen by Dr. Johnsie Cancel. His note states: Preop: Smoker with diabetes, HTN and PVD. Needs surgery sooner rather than latter for limb salvage. No history of CAD, chest pain. Clear to have surgery from cardiac standpoint Surgery will not enter abdomen being fem-tib bypass lower risk See below  Carotid Bruit: Discussed with Dr Kellie Simmering and vascular tech need to make sure he does not have high grade carotid disease before surgery Duplex ordered and will be done in next hour or so.  Anesthesiologist can review results as available prior to surgery tomorrow.  George Hugh [facility] Short Stay Center/Anesthesiology Phone [phone] 06/01/2015 4:44 PM

## 2015-06-01 NOTE — Telephone Encounter (Signed)
LMTCB

## 2015-06-01 NOTE — H&P (View-Only) (Signed)
Subjective:     Patient ID: Donald Steele, male   DOB: [birth year], 55 y.o.   MRN: [id number]  HPI this 55 year old male was referred for urgent evaluation today for ischemic ulcer and rest pain right lower extremity. He was referred by Chana Bode PA. Patient has history of bilateral calf claudication limiting him to less than 1 block. He developed an ulceration of his right foot a month or so ago and that has gradually worsened. He does have a long history of tobacco abuse greater than 1 pack per day for 40 years. He denies any history of coronary artery disease or cardiac symptoms. He does have type 2 diabetes mellitus. He has no history of left leg rest pain.   Past Medical History  Diagnosis Date  . Diabetes mellitus   . Hyperlipidemia   . Femoral bruit   . Peripheral vascular disease due to secondary diabetes mellitus [facility])     Social History  Substance Use Topics  . Smoking status: Current Every Day Smoker -- 1.00 packs/day for 35 years  . Smokeless tobacco: Not on file  . Alcohol Use: No    No family history on file.  No Known Allergies   Current outpatient prescriptions:  .  amoxicillin (AMOXIL) 500 MG capsule, Take 1 capsule (500 mg total) by mouth 3 (three) times daily., Disp: 21 capsule, Rfl: 0 .  doxycycline (VIBRAMYCIN) 100 MG capsule, Take 1 capsule (100 mg total) by mouth 2 (two) times daily., Disp: 14 capsule, Rfl: 0 .  furosemide (LASIX) 20 MG tablet, Take 20 mg by mouth., Disp: , Rfl:  .  glipiZIDE (GLUCOTROL) 5 MG tablet, Take by mouth daily before breakfast., Disp: , Rfl:  .  metFORMIN (GLUCOPHAGE) 1000 MG tablet, Take 1,000 mg by mouth 2 (two) times daily with a meal., Disp: , Rfl:  .  traMADol (ULTRAM) 50 MG tablet, Take 1 tablet (50 mg total) by mouth every 6 (six) hours as needed., Disp: 15 tablet, Rfl: 0  Filed Vitals:   05/31/15 1509  BP: 123/66  Pulse: 76  Temp: 97.5 F (36.4 C)  TempSrc: Oral  Resp: 16  Height: 5\' 11"  (1.803 m)  Weight: 175 lb  (79.379 kg)  SpO2: 100%    Body mass index is 24.42 kg/(m^2).           Review of Systems Denies chest pain, dyspnea on exertion, PND, orthopnea, hemoptysis, lateralizing weakness,   aphasia, amaurosis fugax, diplopia, blurred vision, syncope. All other systems negative and complete review of systems     Objective:   Physical Exam BP 123/66 mmHg  Pulse 76  Temp(Src) 97.5 F (36.4 C) (Oral)  Resp 16  Ht 5\' 11"  (1.803 m)  Wt 175 lb (79.379 kg)  BMI 24.42 kg/m2  SpO2 100%  Gen.-alert and oriented x3 in no apparent distress HEENT normal for age Lungs no rhonchi or wheezing Cardiovascular regular rhythm no murmurs carotid pulses 3+ palpable  -bilateral carotid bruits right louder than left Abdomen soft nontender no palpable masses Musculoskeletal free of  major deformities Skin clear -no rashes Neurologic normal Lower extremities 3+ femoral  Pulses palpable bilaterally. No popliteal or distal pulses palpable. Right foot has ulceration on dorsal aspect over extensor tendons measuring about 3 cm in diameter with no tendons exposed. Also eschar on the lateral anterior aspect of the right foot. Does have decreased sensation bilaterally in the feet right worse than left. No calf tenderness. Today I ordered ABIs and a duplex scan of the  right leg which I reviewed and interpreted. Patient has total occlusion of the right SFA with multiple areas of stenosis down to the tibioperoneal trunk with patent tibial vessels. ABIs reveal 0.38 on the right and 0.54 on the left with monophasic flow       Assessment:      #1 rest ischemia right leg with limb threatening ulceration right foot due to superficial femoral-popliteal and tibial occlusive disease  #2 long history of tobacco abuseongoing40+ pack years #3 diabetes mellitus type 2     Plan:      patient needs angiography as soon as possible with attempted revascularization if feasible 2 achieve limb salvage We'll plan angiography  tomorrow to see if he is candidate for bypass If so we'll proceed with femoral-popliteal or tibial bypass grafting either Thursday or Friday of this week  Also discussed with him the importance of complete tobacco cessation which she states he will do following the surgery

## 2015-06-01 NOTE — Progress Notes (Signed)
Vascular Surgery Progress Note  Subjective: Patient with severe ischemic ulcerations right foot. Angiogram today revealed total occlusion right SFA with severe stenosis right profunda femoris artery. Below-knee popliteal artery and tibial vessels all diffusely diseased but posterior tibial artery is open to the ankle level. Only chance for limb salvage will be femoral to posterior tibial bypass with endarterectomy of profunda.  Objective:  Filed Vitals:   06/01/15 1315 06/01/15 1320  BP: 151/70   Pulse: 86 84  Temp:    Resp:      Alert and oriented 3 3+ right femoral pulse palpable Ulcerations dorsum right foot and lateral dorsum of foot stable with dr   Labs:  Recent Labs Lab 06/01/15 1117  CREATININE 0.70    Recent Labs Lab 06/01/15 1117  NA 142  K 3.8  CL 104  BUN 9  CREATININE 0.70  GLUCOSE 176*    Recent Labs Lab 06/01/15 1117  HGB 13.9  HCT 41.0   No results for input(s): INR in the last 168 hours.     Imaging: No results found.   s/p Procedure(s):   Plan admit patient for IV hydration and antibiotics We'll attempt right femoral to posterior tibial bypass with saphenous vein which is marginal in size and also into diseased outflow tract excellent patient understands that this is his only chance for limb salvage but would like to proceed   [name], MD 06/01/2015 1:57 PM

## 2015-06-01 NOTE — Progress Notes (Signed)
error 

## 2015-06-01 NOTE — Interval H&P Note (Signed)
History and Physical Interval Note:  06/01/2015 11:25 AM  Donald Steele  has presented today for surgery, with the diagnosis of pvd  The various methods of treatment have been discussed with the patient and family. After consideration of risks, benefits and other options for treatment, the patient has consented to  Procedure(s): Abdominal Aortogram (N/A) as a surgical intervention .  The patient's history has been reviewed, patient examined, no change in status, stable for surgery.  I have reviewed the patient's chart and labs.  Questions were answered to the patient's satisfaction.     Donald Steele  Discussed details of procedure with patient Right foot ulcer CV:RRR PULM:CTA  Plan angio   WB

## 2015-06-01 NOTE — Telephone Encounter (Signed)
Let him know that I reviewed the vascular surgeon's note and glad we got him in so quickly as he has severe disease in the leg.     I understand they will be doing a procedure today and possibly bypass graft on the leg later this week.   I will see him back soon to discuss diabetes and other once he has the procedure and I get records from his prior doctor.  Did Dr. Kellie Simmering say anything about his heart, cardiology consult or other?   We will need to address this as well but the leg is the current focus.    Tell him thank you for coming in when he did, and we are glad we could help him along the right path.

## 2015-06-01 NOTE — Discharge Instructions (Signed)
Angiogram, Care After °Refer to this sheet in the next few weeks. These instructions provide you with information about caring for yourself after your procedure. Your health care provider may also give you more specific instructions. Your treatment has been planned according to current medical practices, but problems sometimes occur. Call your health care provider if you have any problems or questions after your procedure. °WHAT TO EXPECT AFTER THE PROCEDURE °After your procedure, it is typical to have the following: °· Bruising at the catheter insertion site that usually fades within 1-2 weeks. °· Blood collecting in the tissue (hematoma) that may be painful to the touch. It should usually decrease in size and tenderness within 1-2 weeks. °HOME CARE INSTRUCTIONS °· Take medicines only as directed by your health care provider. °· You may shower 24-48 hours after the procedure or as directed by your health care provider. Remove the bandage (dressing) and gently wash the site with plain soap and water. Pat the area dry with a clean towel. Do not rub the site, because this may cause bleeding. °· Do not take baths, swim, or use a hot tub until your health care provider approves. °· Check your insertion site every day for redness, swelling, or drainage. °· Do not apply powder or lotion to the site. °· Do not lift over 10 lb (4.5 kg) for 5 days after your procedure or as directed by your health care provider. °· Ask your health care provider when it is okay to: °¨ Return to work or school. °¨ Resume usual physical activities or sports. °¨ Resume sexual activity. °· Do not drive home if you are discharged the same day as the procedure. Have someone else drive you. °· You may drive 24 hours after the procedure unless otherwise instructed by your health care provider. °· Do not operate machinery or power tools for 24 hours after the procedure or as directed by your health care provider. °· If your procedure was done as an  outpatient procedure, which means that you went home the same day as your procedure, a responsible adult should be with you for the first 24 hours after you arrive home. °· Keep all follow-up visits as directed by your health care provider. This is important. °SEEK MEDICAL CARE IF: °· You have a fever. °· You have chills. °· You have increased bleeding from the catheter insertion site. Hold pressure on the site. °SEEK IMMEDIATE MEDICAL CARE IF: °· You have unusual pain at the catheter insertion site. °· You have redness, warmth, or swelling at the catheter insertion site. °· You have drainage (other than a small amount of blood on the dressing) from the catheter insertion site. °· The catheter insertion site is bleeding, and the bleeding does not stop after 30 minutes of holding steady pressure on the site. °· The area near or just beyond the catheter insertion site becomes pale, cool, tingly, or numb. °  °This information is not intended to replace advice given to you by your health care provider. Make sure you discuss any questions you have with your health care provider. °  °Document Released: 10/19/2004 Document Revised: 04/23/2014 Document Reviewed: 09/03/2012 °Elsevier Interactive Patient Education ©2016 [employer] ° °

## 2015-06-01 NOTE — Consult Note (Signed)
CARDIOLOGY CONSULT NOTE       Patient ID: Donald Steele MRN: [id number] DOB/AGE: 06-12-1960 55 y.o.  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 smoker with PVD.  History of poorly controlled DM.  No history of CAD. No chest pain.  Has had bilateral foot pain for over 2 months right greater than left has ulcer on heel  Angio done today by Dr Trula Slade shows right SFA occlusion. Plan for fem tibial bypass with Dr Kellie Simmering in am.  His walking has recently been limited by foot pain but no dyspnea, palpitations or chest pain. Has no had anesthesia or surgery before.  Not totally compliant with meds for BP/ DM.  Has over 35 pack year history of smoking No bleeding issues.  Denies excess ETOH     ROS All other systems reviewed and negative except as noted above  Past Medical History  Diagnosis Date  . Diabetes mellitus   . Hyperlipidemia   . Femoral bruit   . Peripheral vascular disease due to secondary diabetes mellitus ([place])     No family history on file.  Social History   Social History  . Marital Status: Single    Spouse Name: N/A  . Number of Children: N/A  . Years of Education: N/A   Occupational History  . Not on file.   Social History Main Topics  . Smoking status: Current Every Day Smoker -- 1.00 packs/day for 35 years  . Smokeless tobacco: Not on file  . Alcohol Use: No  . Drug Use: No  . Sexual Activity: Not on file   Other Topics Concern  . Not on file   Social History Narrative    Past Surgical History  Procedure Laterality Date  . No history of surgery        . [START ON 06/02/2015] docusate sodium  100 mg Oral Daily  . [START ON 06/02/2015] enoxaparin (LOVENOX) injection  40 mg Subcutaneous Q24H  .  pantoprazole  40 mg Oral Daily  . potassium chloride  20-40 mEq Oral Once   . sodium chloride      Physical Exam: Blood pressure 172/82, pulse 92, temperature 97.4 F (36.3 C), temperature source Oral, resp. rate 16, height 5\' 11"  (1.803 m), weight 81.647 kg (180 lb), SpO2 98 %.   Affect appropriate Middle aged black male HEENT: normal Neck supple with no adenopathy JVP normal bilaeral bruits no thyromegaly Lungs clear with no wheezing and good diaphragmatic motion Heart:  S1/S2 no murmur, no rub, gallop or click PMI normal Abdomen: benighn, BS positve, no tenderness, no AAA no bruit.  No HSM or HJR Decreased peripheral puses.  Bilateral femoral bruis post angio from left femoral artery no hematoma Ischemic changes both feet  No edema Neuro non-focal Skin warm and dry No muscular weakness   Labs:   Lab Results  Component Value Date   HGB 13.9 06/01/2015   HCT 41.0 06/01/2015    Recent Labs Lab 06/01/15 1117  NA 142  K 3.8  CL 104  BUN 9  CREATININE 0.70  GLUCOSE 176*      Radiology: Dg Foot Complete Right  05/26/2015  CLINICAL DATA:  Per pt: for a month there is a skin infection on the top and side and on the  ankle of the right foot. Skin irritation on the right foot, superior aspect, lateral with irritation extending to the ankle. No injury. Patient is a diabetic EXAM: RIGHT FOOT COMPLETE - 3+ VIEW COMPARISON:  None. FINDINGS: No fracture. There are no areas of bone resorption to suggest osteomyelitis. Joints are normally spaced and aligned. There is mild diffuse soft tissue edema most evident of the forefoot. No soft tissue air. Small dorsal plantar calcaneal spurs are noted. IMPRESSION: 1. No fracture. No significant joint abnormality. No evidence of osteomyelitis. Electronically Signed   By: Lajean Manes M.D.   On: 05/26/2015 20:00    EKG:  SR LVH with strain    ASSESSMENT AND PLAN:   Preop:   Smoker with diabetes, HTN and PVD.  Needs surgery sooner rather  than latter for limb salvage. No history of CAD, chest pain.  Clear to have surgery from cardiac standpoint  Surgery will not enter abdomen being fem-tib bypass lower risk See below  Carotid Bruit:  Discussed with Dr Kellie Simmering and vascular tech need to make sure he does not have high grade carotid disease before surgery Duplex ordered and will be done in next hour or so  HTN:   With DM should be on ACE/ARB will write for first dose tonight  DM:  Will need to be covered with SS insulin while in hospital consider having IM follow  Smoking:  Discussed need to stop to prevent graft occlusion and future vascular events.    SignedJenkins Rouge 06/01/2015, 3:41 PM

## 2015-06-01 NOTE — Progress Notes (Signed)
VASCULAR LAB PRELIMINARY  PRELIMINARY  PRELIMINARY  PRELIMINARY  Carotid duplex  completed.    Preliminary report:  Bilateral:  1-39% ICA stenosis.  ECA stenosis, left much greater than right.  Vertebral artery flow is antegrade.  Left:  Vertebral artery waveform is diminished.    Lovelle Deitrick, RVT 06/01/2015, 6:22 PM

## 2015-06-01 NOTE — Progress Notes (Signed)
Right Lower Extremity Vein Map Preliminary    Right Great Saphenous Vein   Segment Diameter Comment  1. Origin 4.86mm   2. High Thigh 4.88mm   3. Mid Thigh 4.22mm branch  4. Low Thigh 3.28mm branch  5. At Knee 2.42mm branch  6. High Calf 2.13mm Multiple branches  7. Low Calf 1.10mm   8. Ankle 1.59mm    mm    mm    mm     Landry Mellow, RDMS, RVT 06/01/2015

## 2015-06-01 NOTE — Progress Notes (Signed)
OOB/ ambulating without complain. Groin site level 0.

## 2015-06-01 NOTE — Op Note (Signed)
    Patient name: Donald Steele MRN: [id number] DOB: Jun 27, 1960 Sex: male  06/01/2015 Pre-operative Diagnosis: right foot ulcer Post-operative diagnosis:  Same Surgeon:  [name] Procedure Performed:  1.   Ultrasound-guided access, left femoral artery  2.   Abdominal aortogram  3.   Right lower extremity runoff  4.   Second order catheterization     Indications:   The patient presented with a right heel ulcer and ultrasound that shows multiparity level disease.  He is here for further evaluation.  Procedure:  The patient was identified in the holding area and taken to room 8.  The patient was then placed supine on the table and prepped and draped in the usual sterile fashion.  A time out was called.  Ultrasound was used to evaluate the left common femoral artery.  It was patent .  A digital ultrasound image was acquired.  A micropuncture needle was used to access the left common femoral artery under ultrasound guidance.  An 018 wire was advanced without resistance and a micropuncture sheath was placed.  The 018 wire was removed and a benson wire was placed.  The micropuncture sheath was exchanged for a 5 french sheath.  An omniflush catheter was advanced over the wire to the level of L-1.  An abdominal angiogram was obtained.  Next, using the omniflush catheter and a benson wire, the aortic bifurcation was crossed and the catheter was placed into theright external iliac artery and right runoff was obtained.    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  The infrarenal abdominal aorta is patent throughout it's course.  Bilateral common and external iliac arteries are widely patent.  The right hypogastric artery is occluded.  Right Lower Extremity:   The right common femoral artery is patent.  There is a high-grade stenosis at the origin of the profundofemoral artery for approximately 2 cm.  The superficial femoral artery is occluded.  There is reconstitution  of a diseased below knee popliteal artery.  There is severe tibial disease with a dominant vessel being the posterior tibial.  Left Lower Extremity:   Not evaluated  Intervention:   none  Impression:  #1   High-grade right profunda femoral stenosis.  #2   Occluded right superficial femoral artery with reconstitution of the below-knee popliteal artery which is diseased  #3   Severe tibial disease  Proximally, with the dominant vessel being the posterior tibial     V. [name], M.D. [facility] Office: [phone] Pager:  [phone]

## 2015-06-01 NOTE — Pre-Procedure Instructions (Addendum)
Andreaus Tezak  06/01/2015      [id number] [address], [place] [address] Phone: [phone] Fax: [phone]    Your procedure is scheduled on Thursday, June 02, 2015  Report to [facility] Admitting at 5:30 A.M.  Call this number if you have problems the morning of surgery:  [phone]   Remember:  Do not eat food or drink liquids after midnight.  Take these medicines the morning of surgery with A SIP OF WATER : LYRICA,  if needed: traMADol (ULTRAM)   Do not take glipiZIDE (GLUCOTROL) after lunch today; Do not take  oral diabetes medicines the morning of surgery such as metFORMIN (GLUCOPHAGE) and glipiZIDE (GLUCOTROL).  Stop taking  Vitamins, fish oil and herbal medications. Do not take any NSAIDs ie: Ibuprofen, Advil, Naproxen, BC and Goody powder; stop now.  How to Manage Your Diabetes Before Surgery Why is it important to control my blood sugar before and after surgery?   Improving blood sugar levels before and after surgery helps healing and can limit problems.  A way of improving blood sugar control is eating a healthy diet by:  - Eating less sugar and carbohydrates  - Increasing activity/exercise  - Talk with your doctor about reaching your blood sugar goals  High blood sugars (greater than 180 mg/dL) can raise your risk of infections and slow down your recovery so you will need to focus on controlling your diabetes during the weeks before surgery.  Make sure that the doctor who takes care of your diabetes knows about your planned surgery including the date and location.  How do I manage my blood sugars before surgery?   Check your blood sugar at least 4 times a day, 2 days before surgery to make sure that they are not too high or low.   Check your blood sugar the morning of your surgery when you wake up and every 2 hours until you get to the Short-Stay unit.  If your blood sugar  is less than 70 mg/dL, you will need to treat for low blood sugar by:  Treat a low blood sugar (less than 70 mg/dL) with 1/2 cup of clear juice (cranberry or apple), 4 glucose tablets, OR glucose gel.  Recheck blood sugar in 15 minutes after treatment (to make sure it is greater than 70 mg/dL).  If blood sugar is not greater than 70 mg/dL on re-check, call [phone] for further instructions.   Report your blood sugar to the Short-Stay nurse when you get to Short-Stay.  References:  [facility], 2007 "How to Manage your Diabetes Before and After Surgery".  Do not wear jewelry, make-up or nail polish.  Do not wear lotions, powders, or perfumes.  You may not wear deodorant.  Do not shave 48 hours prior to surgery.  Men may shave face and neck.  Do not bring valuables to the hospital.  [facility] is not responsible for any belongings or valuables.  Contacts, dentures or bridgework may not be worn into surgery.  Leave your suitcase in the car.  After surgery it may be brought to your room.  For patients admitted to the hospital, discharge time will be determined by your treatment team.  Patients discharged the day of surgery will not be allowed to drive home.   Name and phone number of your driver:   Special instructions:  [place] - Preparing for Surgery  Before  surgery, you can play an important role.  Because skin is not sterile, your skin needs to be as free of germs as possible.  You can reduce the number of germs on you skin by washing with CHG (chlorahexidine gluconate) soap before surgery.  CHG is an antiseptic cleaner which kills germs and bonds with the skin to continue killing germs even after washing.  Please DO NOT use if you have an allergy to CHG or antibacterial soaps.  If your skin becomes reddened/irritated stop using the CHG and inform your nurse when you arrive at Short Stay.  Do not shave (including legs and underarms) for at least 48  hours prior to the first CHG shower.  You may shave your face.  Please follow these instructions carefully:   1.  Shower with CHG Soap the night before surgery and the morning of Surgery.  2.  If you choose to wash your hair, wash your hair first as usual with your normal shampoo.  3.  After you shampoo, rinse your hair and body thoroughly to remove the Shampoo.  4.  Use CHG as you would any other liquid soap.  You can apply chg directly  to the skin and wash gently with scrungie or a clean washcloth.  5.  Apply the CHG Soap to your body ONLY FROM THE NECK DOWN.  Do not use on open wounds or open sores.  Avoid contact with your eyes, ears, mouth and genitals (private parts).  Wash genitals (private parts) with your normal soap.  6.  Wash thoroughly, paying special attention to the area where your surgery will be performed.  7.  Thoroughly rinse your body with warm water from the neck down.  8.  DO NOT shower/wash with your normal soap after using and rinsing off the CHG Soap.  9.  Pat yourself dry with a clean towel.            10.  Wear clean pajamas.            11.  Place clean sheets on your bed the night of your first shower and do not sleep with pets.  Day of Surgery  Do not apply any lotions/deodorants the morning of surgery.  Please wear clean clothes to the hospital/surgery center.  Please read over the following fact sheets that you were given. Pain Booklet, Coughing and Deep Breathing, Blood Transfusion Information, MRSA Information and Surgical Site Infection Prevention

## 2015-06-01 NOTE — Progress Notes (Signed)
Anesthesia has requested cardiac clearance for this patient Patient does have severely ischemic apparent full-thickness ulceration right foot due to severe lower extremity occlusive disease. He has no symptoms of coronary artery disease but does have an abnormal EKG with previous inferior myocardial infarction Patient has heavy tobacco abuse history and diffuse lower extremity occlusive disease so certainly is likely to have coronary artery disease as well  Revascularization surgery for right leg is quite urgent if we name to achieve limb salvage Have asked cardiology to see patient and give [place] opinion regarding cardiac clearance for this urgent procedure Even with successful bypass surgery patient may not achieve limb salvage because of the severe chronic nature of his lower extremity occlusive disease  Plan femoral tibial bypass grafting in the a.m. if clearance is achieved

## 2015-06-02 ENCOUNTER — Encounter (HOSPITAL_COMMUNITY)
Admission: AD | Disposition: A | Discharge status: Home Health | Payer: Self-pay | Source: Ambulatory Visit | Attending: Vascular Surgery

## 2015-06-02 ENCOUNTER — Inpatient Hospital Stay (HOSPITAL_COMMUNITY): Payer: BLUE CROSS/BLUE SHIELD | Admitting: Vascular Surgery

## 2015-06-02 ENCOUNTER — Inpatient Hospital Stay (HOSPITAL_COMMUNITY)
Admission: RE | Admit: 2015-06-02 | Payer: BLUE CROSS/BLUE SHIELD | Source: Ambulatory Visit | Admitting: Vascular Surgery

## 2015-06-02 ENCOUNTER — Inpatient Hospital Stay (HOSPITAL_COMMUNITY): Payer: BLUE CROSS/BLUE SHIELD

## 2015-06-02 ENCOUNTER — Encounter (HOSPITAL_COMMUNITY): Payer: Self-pay | Admitting: Surgery

## 2015-06-02 ENCOUNTER — Other Ambulatory Visit: Payer: Self-pay

## 2015-06-02 HISTORY — PX: ENDARTERECTOMY FEMORAL: SHX5804

## 2015-06-02 HISTORY — PX: INTRAOPERATIVE ARTERIOGRAM: SHX5157

## 2015-06-02 HISTORY — PX: FEMORAL-TIBIAL BYPASS GRAFT: SHX938

## 2015-06-02 HISTORY — PX: VEIN HARVEST: SHX6363

## 2015-06-02 LAB — GLUCOSE, CAPILLARY
GLUCOSE-CAPILLARY: 125 mg/dL — AB (ref 65–99)
GLUCOSE-CAPILLARY: 145 mg/dL — AB (ref 65–99)
Glucose-Capillary: 123 mg/dL — ABNORMAL HIGH (ref 65–99)
Glucose-Capillary: 171 mg/dL — ABNORMAL HIGH (ref 65–99)

## 2015-06-02 LAB — CBC
HEMATOCRIT: 35.8 % — AB (ref 39.0–52.0)
Hemoglobin: 12 g/dL — ABNORMAL LOW (ref 13.0–17.0)
MCH: 29.1 pg (ref 26.0–34.0)
MCHC: 33.5 g/dL (ref 30.0–36.0)
MCV: 86.9 fL (ref 78.0–100.0)
Platelets: 218 10*3/uL (ref 150–400)
RBC: 4.12 MIL/uL — ABNORMAL LOW (ref 4.22–5.81)
RDW: 12.3 % (ref 11.5–15.5)
WBC: 7.5 10*3/uL (ref 4.0–10.5)

## 2015-06-02 LAB — BASIC METABOLIC PANEL
ANION GAP: 8 (ref 5–15)
BUN: 6 mg/dL (ref 6–20)
CALCIUM: 8.9 mg/dL (ref 8.9–10.3)
CO2: 23 mmol/L (ref 22–32)
Chloride: 110 mmol/L (ref 101–111)
Creatinine, Ser: 0.73 mg/dL (ref 0.61–1.24)
Glucose, Bld: 174 mg/dL — ABNORMAL HIGH (ref 65–99)
Potassium: 4 mmol/L (ref 3.5–5.1)
SODIUM: 141 mmol/L (ref 135–145)

## 2015-06-02 LAB — PROTIME-INR
INR: 1.11 (ref 0.00–1.49)
PROTHROMBIN TIME: 14.5 s (ref 11.6–15.2)

## 2015-06-02 LAB — MRSA PCR SCREENING: MRSA by PCR: NEGATIVE

## 2015-06-02 SURGERY — CREATION, BYPASS, ARTERIAL, FEMORAL TO TIBIAL, USING GRAFT
Anesthesia: General | Site: Leg Lower | Laterality: Right

## 2015-06-02 MED ORDER — ONDANSETRON HCL 4 MG/2ML IJ SOLN
INTRAMUSCULAR | Status: DC | PRN
  Administered 2015-06-02: 4 mg via INTRAVENOUS

## 2015-06-02 MED ORDER — NEOSTIGMINE METHYLSULFATE 10 MG/10ML IV SOLN
INTRAVENOUS | Status: DC | PRN
  Administered 2015-06-02: 4 mg via INTRAVENOUS

## 2015-06-02 MED ORDER — BISACODYL 10 MG RE SUPP: 10.0000 mg | Freq: Every day | RECTAL | Status: DC | PRN

## 2015-06-02 MED ORDER — PROPOFOL 10 MG/ML IV BOLUS
INTRAVENOUS | Status: DC | PRN
  Administered 2015-06-02: 150 mg via INTRAVENOUS

## 2015-06-02 MED ORDER — IOHEXOL 300 MG/ML  SOLN
INTRAMUSCULAR | Status: DC | PRN
  Administered 2015-06-02: 50 mL via INTRAVENOUS

## 2015-06-02 MED ORDER — GLYCOPYRROLATE 0.2 MG/ML IJ SOLN
INTRAMUSCULAR | Status: DC | PRN
  Administered 2015-06-02: 0.6 mg via INTRAVENOUS

## 2015-06-02 MED ORDER — 0.9 % SODIUM CHLORIDE (POUR BTL) OPTIME
TOPICAL | Status: DC | PRN
  Administered 2015-06-02: 2000 mL

## 2015-06-02 MED ORDER — HYDROMORPHONE HCL 1 MG/ML IJ SOLN
0.2500 mg | INTRAMUSCULAR | Status: DC | PRN
  Administered 2015-06-02 (×2): 0.5 mg via INTRAVENOUS

## 2015-06-02 MED ORDER — PROTAMINE SULFATE 10 MG/ML IV SOLN
INTRAVENOUS | Status: DC | PRN
  Administered 2015-06-02: 20 mg via INTRAVENOUS
  Administered 2015-06-02: 10 mg via INTRAVENOUS
  Administered 2015-06-02: 20 mg via INTRAVENOUS

## 2015-06-02 MED ORDER — CEFUROXIME SODIUM 1.5 G IJ SOLR
1.5000 g | Freq: Two times a day (BID) | INTRAMUSCULAR | Status: AC
  Administered 2015-06-02 – 2015-06-03 (×2): 1.5 g via INTRAVENOUS
  Filled 2015-06-02 (×2): qty 1.5

## 2015-06-02 MED ORDER — PROMETHAZINE HCL 25 MG/ML IJ SOLN: 6.2500 mg | INTRAMUSCULAR | Status: DC | PRN

## 2015-06-02 MED ORDER — MIDAZOLAM HCL 2 MG/2ML IJ SOLN: 0.5000 mg | Freq: Once | INTRAMUSCULAR | Status: DC | PRN

## 2015-06-02 MED ORDER — LIDOCAINE HCL (CARDIAC) 20 MG/ML IV SOLN
INTRAVENOUS | Status: DC | PRN
  Administered 2015-06-02: 50 mg via INTRAVENOUS

## 2015-06-02 MED ORDER — MAGNESIUM SULFATE 2 GM/50ML IV SOLN
2.0000 g | Freq: Every day | INTRAVENOUS | Status: DC | PRN
  Filled 2015-06-02: qty 50

## 2015-06-02 MED ORDER — ROCURONIUM BROMIDE 100 MG/10ML IV SOLN
INTRAVENOUS | Status: DC | PRN
  Administered 2015-06-02: 50 mg via INTRAVENOUS
  Administered 2015-06-02: 20 mg via INTRAVENOUS
  Administered 2015-06-02 (×2): 10 mg via INTRAVENOUS

## 2015-06-02 MED ORDER — HEPARIN SODIUM (PORCINE) 1000 UNIT/ML IJ SOLN
INTRAMUSCULAR | Status: DC | PRN
  Administered 2015-06-02: 6000 [IU] via INTRAVENOUS

## 2015-06-02 MED ORDER — LACTATED RINGERS IV SOLN
INTRAVENOUS | Status: DC | PRN
  Administered 2015-06-02: 08:00:00 via INTRAVENOUS

## 2015-06-02 MED ORDER — SODIUM CHLORIDE 0.9 % IV SOLN: 500.0000 mL | Freq: Once | INTRAVENOUS | Status: DC | PRN

## 2015-06-02 MED ORDER — SODIUM CHLORIDE 0.9 % IV SOLN
INTRAVENOUS | Status: DC | PRN
  Administered 2015-06-02: 07:00:00

## 2015-06-02 MED ORDER — MEPERIDINE HCL 25 MG/ML IJ SOLN: 6.2500 mg | INTRAMUSCULAR | Status: DC | PRN

## 2015-06-02 MED ORDER — PHENYLEPHRINE HCL 10 MG/ML IJ SOLN
INTRAMUSCULAR | Status: DC | PRN
  Administered 2015-06-02: 120 ug via INTRAVENOUS
  Administered 2015-06-02: 80 ug via INTRAVENOUS

## 2015-06-02 MED ORDER — FENTANYL CITRATE (PF) 100 MCG/2ML IJ SOLN
INTRAMUSCULAR | Status: DC | PRN
  Administered 2015-06-02: 250 ug via INTRAVENOUS

## 2015-06-02 MED ORDER — POTASSIUM CHLORIDE CRYS ER 20 MEQ PO TBCR: 20.0000 meq | EXTENDED_RELEASE_TABLET | Freq: Every day | ORAL | Status: DC | PRN

## 2015-06-02 MED ORDER — PHENYLEPHRINE HCL 10 MG/ML IJ SOLN
10.0000 mg | INTRAVENOUS | Status: DC | PRN
  Administered 2015-06-02: 25 ug/min via INTRAVENOUS

## 2015-06-02 MED ORDER — MIDAZOLAM HCL 5 MG/5ML IJ SOLN
INTRAMUSCULAR | Status: DC | PRN
  Administered 2015-06-02: 2 mg via INTRAVENOUS

## 2015-06-02 SURGICAL SUPPLY — 48 items
CANISTER SUCTION 2500CC (MISCELLANEOUS) ×4 IMPLANT
CLIP TI MEDIUM 24 (CLIP) ×4 IMPLANT
CLIP TI WIDE RED SMALL 24 (CLIP) ×8 IMPLANT
DRAPE X-RAY CASS 24X20 (DRAPES) ×4 IMPLANT
ELECT REM PT RETURN 9FT ADLT (ELECTROSURGICAL) ×4
ELECTRODE REM PT RTRN 9FT ADLT (ELECTROSURGICAL) ×2 IMPLANT
GLOVE BIO SURGEON STRL SZ 6 (GLOVE) ×4 IMPLANT
GLOVE BIO SURGEON STRL SZ 6.5 (GLOVE) ×6 IMPLANT
GLOVE BIO SURGEONS STRL SZ 6.5 (GLOVE) ×2
GLOVE BIOGEL M 6.5 STRL (GLOVE) ×4 IMPLANT
GLOVE BIOGEL PI IND STRL 6 (GLOVE) ×2 IMPLANT
GLOVE BIOGEL PI IND STRL 6.5 (GLOVE) ×6 IMPLANT
GLOVE BIOGEL PI IND STRL 7.0 (GLOVE) ×10 IMPLANT
GLOVE BIOGEL PI INDICATOR 6 (GLOVE) ×2
GLOVE BIOGEL PI INDICATOR 6.5 (GLOVE) ×6
GLOVE BIOGEL PI INDICATOR 7.0 (GLOVE) ×10
GLOVE SS BIOGEL STRL SZ 7 (GLOVE) ×2 IMPLANT
GLOVE SUPERSENSE BIOGEL SZ 7 (GLOVE) ×2
GLOVE SURG SS PI 6.5 STRL IVOR (GLOVE) ×12 IMPLANT
GOWN STRL REUS W/ TWL LRG LVL3 (GOWN DISPOSABLE) ×12 IMPLANT
GOWN STRL REUS W/TWL LRG LVL3 (GOWN DISPOSABLE) ×12
INSERT FOGARTY SM (MISCELLANEOUS) ×4 IMPLANT
KIT BASIN OR (CUSTOM PROCEDURE TRAY) ×4 IMPLANT
KIT ROOM TURNOVER OR (KITS) ×4 IMPLANT
LIQUID BAND (GAUZE/BANDAGES/DRESSINGS) ×12 IMPLANT
NS IRRIG 1000ML POUR BTL (IV SOLUTION) ×8 IMPLANT
PACK PERIPHERAL VASCULAR (CUSTOM PROCEDURE TRAY) ×4 IMPLANT
PAD ARMBOARD 7.5X6 YLW CONV (MISCELLANEOUS) ×8 IMPLANT
SET COLLECT BLD 21X3/4 12 (NEEDLE) ×4 IMPLANT
STOPCOCK 4 WAY LG BORE MALE ST (IV SETS) ×4 IMPLANT
SUT PROLENE 5 0 C 1 24 (SUTURE) ×4 IMPLANT
SUT PROLENE 6 0 BV (SUTURE) ×8 IMPLANT
SUT PROLENE 6 0 C 1 30 (SUTURE) ×4 IMPLANT
SUT PROLENE 6 0 CC (SUTURE) ×4 IMPLANT
SUT PROLENE 7 0 BV 1 (SUTURE) ×4 IMPLANT
SUT SILK 2 0 (SUTURE) ×2
SUT SILK 2 0 SH (SUTURE) ×4 IMPLANT
SUT SILK 2-0 18XBRD TIE 12 (SUTURE) ×2 IMPLANT
SUT SILK 4 0 (SUTURE) ×6
SUT SILK 4-0 18XBRD TIE 12 (SUTURE) ×6 IMPLANT
SUT VIC AB 2-0 CTX 36 (SUTURE) ×8 IMPLANT
SUT VIC AB 3-0 SH 27 (SUTURE) ×12
SUT VIC AB 3-0 SH 27X BRD (SUTURE) ×12 IMPLANT
SUT VICRYL 4-0 PS2 18IN ABS (SUTURE) ×4 IMPLANT
TRAY FOLEY W/METER SILVER 16FR (SET/KITS/TRAYS/PACK) ×4 IMPLANT
TUBING EXTENTION W/L.L. (IV SETS) ×4 IMPLANT
UNDERPAD 30X30 INCONTINENT (UNDERPADS AND DIAPERS) ×4 IMPLANT
WATER STERILE IRR 1000ML POUR (IV SOLUTION) ×4 IMPLANT

## 2015-06-02 NOTE — Progress Notes (Signed)
Patient ID: Donald Steele, male   DOB: [birth year], 55 y.o.   MRN: [id number]    Subjective:  Denies SSCP, palpitations or Dyspnea For surgery this am   Objective:  Filed Vitals:   06/01/15 1545 06/01/15 1733 06/01/15 2010 06/02/15 0411  BP: 159/74 148/70 179/81 122/62  Pulse: 48 81 79 74  Temp:  98.4 F (36.9 C) 97.7 F (36.5 C) 98.3 F (36.8 C)  TempSrc:  Oral Oral Oral  Resp:  18 18 18   Height:  5\' 11"  (1.803 m)    Weight:  80.7 kg (177 lb 14.6 oz)  80.7 kg (177 lb 14.6 oz)  SpO2: 91% 98% 96% 100%    Intake/Output from previous day:  Intake/Output Summary (Last 24 hours) at 06/02/15 [id number] Last data filed at 06/01/15 1630  Gross per 24 hour  Intake    360 ml  Output      0 ml  Net    360 ml    Physical Exam: Affect appropriate Middle aged black male  HEENT: normal Neck supple with no adenopathy JVP normal bilateral  bruits no thyromegaly Lungs clear with no wheezing and good diaphragmatic motion Heart:  S1/S2 no murmur, no rub, gallop or click PMI normal Abdomen: benighn, BS positve, no tenderness, no AAA LFA angio sight  no bruit.  No HSM or HJR Decreased peripheral pulses with ischemic changes both feet and bilateral femoral bruits  No edema Neuro non-focal Skin warm and dry No muscular weakness   Lab Results: Basic Metabolic Panel:  Recent Labs  06/01/15 2015 06/02/15 0300  NA 143 141  K 4.2 4.0  CL 106 110  CO2 25 23  GLUCOSE 271* 174*  BUN 7 6  CREATININE 0.82 0.73  CALCIUM 9.7 8.9   Liver Function Tests:  Recent Labs  06/01/15 2015  AST 18  ALT 17  ALKPHOS 72  BILITOT 0.6  PROT 7.4  ALBUMIN 3.9   No results for input(s): LIPASE, AMYLASE in the last 72 hours. CBC:  Recent Labs  06/01/15 2015 06/02/15 0300  WBC 7.7 7.5  HGB 14.0 12.0*  HCT 41.9 35.8*  MCV 88.0 86.9  PLT 232 218    Imaging: No results found.  Cardiac Studies:  ECG:   NSR LVH with strain    Telemetry:  NSR  06/02/2015   Echo:   Medications:   .  [MAR Hold] clobetasol ointment  1 application Topical BID  . [MAR Hold] docusate sodium  100 mg Oral Daily  . [MAR Hold] enoxaparin (LOVENOX) injection  40 mg Subcutaneous Q24H  . [MAR Hold] furosemide  20 mg Oral Daily  . [MAR Hold] glipiZIDE  10 mg Oral BID AC  . [MAR Hold] insulin aspart  0-15 Units Subcutaneous TID WC  . [MAR Hold] losartan  50 mg Oral Daily  . [MAR Hold] pantoprazole  40 mg Oral Daily  . [MAR Hold] pregabalin  100 mg Oral BID     . sodium chloride 125 mL/hr at 06/02/15 0414    Assessment/Plan:  Preop:  Clear to have vascular surgery fem-tib for limb salvage HTN:  Improved with ARB DM:  SS insulin  Smoking consider nicotine patch post op Carotid:  Reviewed [place] images no report No high grade stenosis   Jenkins Rouge 06/02/2015, 8:07 AM

## 2015-06-02 NOTE — Anesthesia Procedure Notes (Signed)
Procedure Name: Intubation Date/Time: 06/02/2015 7:42 AM Performed by: Mariea Clonts Pre-anesthesia Checklist: Patient identified, Timeout performed, Emergency Drugs available, Suction available and Patient being monitored Patient Re-evaluated:Patient Re-evaluated prior to inductionOxygen Delivery Method: Circle system utilized Preoxygenation: Pre-oxygenation with 100% oxygen Intubation Type: IV induction Ventilation: Mask ventilation without difficulty Laryngoscope Size: Miller and 3 Grade View: Grade I Tube type: Oral Tube size: 7.5 mm Placement Confirmation: ETT inserted through vocal cords under direct vision,  breath sounds checked- equal and bilateral and positive ETCO2 Tube secured with: Tape Dental Injury: Teeth and Oropharynx as per pre-operative assessment

## 2015-06-02 NOTE — Progress Notes (Signed)
      Patient is alert and comfortable Right PT doppler Incisions with out hematoma  S/P  RIGHT PROFUNDA FEMORIS TO POSTERIOR TIBIAL ARTERY BYPASS USING RIGHT NON-REVERSED GREATER SAPHENOUS VEIN, SUBCUTANEOUS TUNNEL VEIN HARVEST RIGHT GREATER SAPHENOUS VEIN ENDARTERECTOMY RIGHT PROFUNDA FEMORIS ARTERY INTRA OPERATIVE ARTERIOGRAM RIGHT LOWER LEG  Pending 3S room   Linley Moskal MAUREEN PA-C

## 2015-06-02 NOTE — H&P (View-Only) (Signed)
Anesthesia has requested cardiac clearance for this patient Patient does have severely ischemic apparent full-thickness ulceration right foot due to severe lower extremity occlusive disease. He has no symptoms of coronary artery disease but does have an abnormal EKG with previous inferior myocardial infarction Patient has heavy tobacco abuse history and diffuse lower extremity occlusive disease so certainly is likely to have coronary artery disease as well  Revascularization surgery for right leg is quite urgent if we name to achieve limb salvage Have asked cardiology to see patient and give [place] opinion regarding cardiac clearance for this urgent procedure Even with successful bypass surgery patient may not achieve limb salvage because of the severe chronic nature of his lower extremity occlusive disease  Plan femoral tibial bypass grafting in the a.m. if clearance is achieved

## 2015-06-02 NOTE — Op Note (Signed)
OPERATIVE REPORT  Date of Surgery: 06/01/2015 - 06/02/2015  Surgeon: [name], MD  Assistant: Dr. Gae Gallop, Lenord Carbo Checkovich  PA student  Pre-op Diagnosis: ISCHEMIC ULCER RIGHT FOOT SECONDARY TO SEVERE SUPERFICIAL FEMORAL/POPLITEAL/TIBIAL OCCLUSIVE DISEASE  Post-op Diagnosis: ISCHEMIC ULCER RIGHT FOOT SECONDARY TO SEVERE SUPERFICIAL FEMORAL/POPLITEAL/TIBIAL OCCLUSIVE DISEASE  Procedure: Procedure(s): RIGHT PROFUNDA FEMORIS TO POSTERIOR TIBIAL ARTERY BYPASS USING RIGHT NON-REVERSED GREATER SAPHENOUS VEIN,  SUBCUTANEOUS TUNNEL VEIN HARVEST RIGHT GREATER SAPHENOUS VEIN ENDARTERECTOMY RIGHT PROFUNDA FEMORIS ARTERY INTRA OPERATIVE ARTERIOGRAM RIGHT LOWER LEG  Anesthesia: General  EBL-200 cc   The patient was taken to the operating room placed in supine position at which time satisfactory general endotracheal anesthesia was administered. The right leg was prepped with Betadine scrub and solution draped in routine sterile manner. Saphenous vein was then exposed at saphenofemoral junction dissected free down to the distal calf through multiple incisions on the medial aspect of the right leg. Branches were ligated with 3 and 4-0 silk ties and divided it was removed gently dilated with heparinized saline and marked for orientation purposes. It was an adequate veinbeing at least 2 mm in size  In the distal aspect and about 3 mm in size proximally. The common superficial and profundus femoris arteries were dissected free and the inguinal wound. The common femoral had an excellent pulse profunda was dissected free for about 7 or 8 cm there was a severely stenotic lesion about 4-5 cm distal to the origin and we exposed distal to this point. Pulse proximal to this point. Posterior tibial artery was then exposed through one of the vein harvesting incisions in the  Proximal to mid calf soleus muscle was taken down from the tibia with the cautery and the posterior tibial neurovascular  bundle identified. Posterior tibial artery was a 2-1/2-3 mm vessel which had no pulse but was relatively normal in earance. Patient's and heparinized. Femoral vessels occluded with vascular clamps longitudinal opening made in the profunda femoris artery just proximal to the area of severe stenosis arthrotomy was extended through the area of severe stenosis which was a calcified concentric plaque. Plaque was very focal and terminated distally with excellent active bleeding. Localized endarterectomy was performed without difficulty using the elevator and the Potts scissors.  Following this the proximal end of the saphenous vein was spatulated and anastomosed end to side to the profunda femoris with 6-0 Prolene. Clamps released there was good pulse down to the first set of competent valves. Using the retrograde valvulotome the valves were rendered incompetent with resultant excellent flow out the distal and the vein graft. The vein was then positioned in the subcutaneous tunnel and delivered into the distal wound where the posterior tibial artery is then exposed. Posterior tibial artery was occluded proximally and distally with vessel loops of the 15 blade extended with Potts scissors. It was accepted 2-1/2 mm dilator proximally and distally and as noted was a relatively normal vessel at this point. Vein was carefully measured spatulated anastomosis and to side with 6-0 Prolene. Vessel loops were then released and there was an excellent pulse and good Doppler flow in the operative site with the graft open which disappeared with occlusion of the graft.  Intraoperative arteriogram was then performed which revealed widely patent vein graft anastomosed to the posterior tibial artery runoff to the ankle. It retrograde filled up to the popliteal artery which was severely diseased. Protamine was then given to reverse the heparin following adequate hemostasis wounds closed in layers with Vicryl Subcuticular fashion with  Dermabond patient taken recovery room in satisfactory condition  Complications: None  Procedure Details:   [name], MD 06/02/2015 11:05 AM

## 2015-06-02 NOTE — Anesthesia Postprocedure Evaluation (Signed)
Anesthesia Post Note  Patient: Donald Steele  Procedure(s) Performed: Procedure(s) (LRB): RIGHT PROFUNDA FEMORIS TO POSTERIOR TIBIAL ARTERY BYPASS USING RIGHT NON-REVERSED GREATER SAPHENOUS VEIN,  SUBCUTANEOUS TUNNEL (Right) VEIN HARVEST RIGHT GREATER SAPHENOUS VEIN (Right) ENDARTERECTOMY RIGHT PROFUNDA FEMORIS ARTERY (Right) INTRA OPERATIVE ARTERIOGRAM RIGHT LOWER LEG (Right)  Patient location during evaluation: PACU Anesthesia Type: General Level of consciousness: awake and alert, oriented and patient cooperative Pain management: pain level controlled Vital Signs Assessment: post-procedure vital signs reviewed and stable Respiratory status: spontaneous breathing, nonlabored ventilation, respiratory function stable and patient connected to nasal cannula oxygen Cardiovascular status: blood pressure returned to baseline and stable Postop Assessment: no signs of nausea or vomiting Anesthetic complications: no    Last Vitals:  Filed Vitals:   06/02/15 1435 06/02/15 1445  BP: 131/84   Pulse:  87  Temp:  36.6 C  Resp:  16    Last Pain:  Filed Vitals:   06/02/15 1500  PainSc: 2                  Vishaal Strollo,E. Sabir Charters

## 2015-06-02 NOTE — Transfer of Care (Signed)
Immediate Anesthesia Transfer of Care Note  Patient: Donald Steele  Procedure(s) Performed: Procedure(s): RIGHT PROFUNDA FEMORIS TO POSTERIOR TIBIAL ARTERY BYPASS USING RIGHT NON-REVERSED GREATER SAPHENOUS VEIN,  SUBCUTANEOUS TUNNEL (Right) VEIN HARVEST RIGHT GREATER SAPHENOUS VEIN (Right) ENDARTERECTOMY RIGHT PROFUNDA FEMORIS ARTERY (Right) INTRA OPERATIVE ARTERIOGRAM RIGHT LOWER LEG (Right)  Patient Location: PACU  Anesthesia Type:General  Level of Consciousness: awake, alert  and oriented  Airway & Oxygen Therapy: Patient Spontanous Breathing and Patient connected to nasal cannula oxygen  Post-op Assessment: Report given to RN and Post -op Vital signs reviewed and stable  Post vital signs: Reviewed and stable  Last Vitals:  Filed Vitals:   06/01/15 2010 06/02/15 0411  BP: 179/81 122/62  Pulse: 79 74  Temp: 36.5 C 36.8 C  Resp: 18 18    Complications: No apparent anesthesia complications

## 2015-06-02 NOTE — Telephone Encounter (Signed)
LMTCB

## 2015-06-02 NOTE — Anesthesia Preprocedure Evaluation (Addendum)
Anesthesia Evaluation  Patient identified by MRN, date of birth, ID band Patient awake    Reviewed: Allergy & Precautions, NPO status , Patient's Chart, lab work & pertinent test results  History of Anesthesia Complications Negative for: history of anesthetic complications  Airway Mallampati: II  TM Distance: >3 FB Neck ROM: Full    Dental  (+) Missing, Dental Advisory Given   Pulmonary Current Smoker,    breath sounds clear to auscultation       Cardiovascular (-) angina+ Peripheral Vascular Disease   Rhythm:Regular Rate:Normal     Neuro/Psych negative neurological ROS     GI/Hepatic Neg liver ROS, GERD  Poorly Controlled,  Endo/Other  diabetes (glu 125), Oral Hypoglycemic Agents  Renal/GU negative Renal ROS     Musculoskeletal   Abdominal   Peds  Hematology   Anesthesia Other Findings   Reproductive/Obstetrics                           Anesthesia Physical Anesthesia Plan  ASA: III  Anesthesia Plan: General   Post-op Pain Management:    Induction: Intravenous  Airway Management Planned: Oral ETT  Additional Equipment:   Intra-op Plan:   Post-operative Plan: Extubation in OR  Informed Consent: I have reviewed the patients History and Physical, chart, labs and discussed the procedure including the risks, benefits and alternatives for the proposed anesthesia with the patient or authorized representative who has indicated his/her understanding and acceptance.   Dental advisory given  Plan Discussed with: CRNA and Surgeon  Anesthesia Plan Comments: (Plan routine monitors, GETA)        Anesthesia Quick Evaluation

## 2015-06-02 NOTE — Progress Notes (Signed)
Pt admitted to [facility] from PACU. Oriented to unit and room. Safety discussed and call bell with in reach. VSS. Pulses dopplerable. will continue to monitor.  Erick Blinks, RN

## 2015-06-02 NOTE — Progress Notes (Signed)
Pt has pre existing, what appears to be healing wound on dorsum of right foot. Dr Kellie Simmering at bedside & wanted wound dressed with dry guaze & Kerlix, and this was done.

## 2015-06-02 NOTE — Interval H&P Note (Signed)
History and Physical Interval Note:  06/02/2015 7:31 AM  Donald Steele  has presented today for surgery, with the diagnosis of Peripheral Vascular Disease with Right Lower Extremity Ulcer  I70.239  The various methods of treatment have been discussed with the patient and family. After consideration of risks, benefits and other options for treatment, the patient has consented to  Procedure(s): BYPASS GRAFT FEMORAL-TIBIAL ARTERY (Right) as a surgical intervention .  The patient's history has been reviewed, patient examined, no change in status, stable for surgery.  I have reviewed the patient's chart and labs.  Questions were answered to the patient's satisfaction.     [name]

## 2015-06-03 ENCOUNTER — Inpatient Hospital Stay (HOSPITAL_COMMUNITY): Payer: BLUE CROSS/BLUE SHIELD

## 2015-06-03 ENCOUNTER — Encounter (HOSPITAL_COMMUNITY): Payer: Self-pay

## 2015-06-03 DIAGNOSIS — R9431 Abnormal electrocardiogram [ECG] [EKG]: Secondary | ICD-10-CM

## 2015-06-03 DIAGNOSIS — Z9889 Other specified postprocedural states: Secondary | ICD-10-CM

## 2015-06-03 LAB — BASIC METABOLIC PANEL
Anion gap: 10 (ref 5–15)
CHLORIDE: 107 mmol/L (ref 101–111)
CO2: 25 mmol/L (ref 22–32)
CREATININE: 0.74 mg/dL (ref 0.61–1.24)
Calcium: 8.8 mg/dL — ABNORMAL LOW (ref 8.9–10.3)
GFR calc Af Amer: >60 mL/min (ref >60)
GFR calc non Af Amer: >60 mL/min (ref >60)
Glucose, Bld: 140 mg/dL — ABNORMAL HIGH (ref 65–99)
POTASSIUM: 4.1 mmol/L (ref 3.5–5.1)
Sodium: 142 mmol/L (ref 135–145)

## 2015-06-03 LAB — CBC
HEMATOCRIT: 35 % — AB (ref 39.0–52.0)
HEMOGLOBIN: 11.8 g/dL — AB (ref 13.0–17.0)
MCH: 29.6 pg (ref 26.0–34.0)
MCHC: 33.7 g/dL (ref 30.0–36.0)
MCV: 87.7 fL (ref 78.0–100.0)
Platelets: 198 10*3/uL (ref 150–400)
RBC: 3.99 MIL/uL — ABNORMAL LOW (ref 4.22–5.81)
RDW: 12.4 % (ref 11.5–15.5)
WBC: 10 10*3/uL (ref 4.0–10.5)

## 2015-06-03 LAB — GLUCOSE, CAPILLARY
GLUCOSE-CAPILLARY: 161 mg/dL — AB (ref 65–99)
GLUCOSE-CAPILLARY: 86 mg/dL (ref 65–99)
Glucose-Capillary: 138 mg/dL — ABNORMAL HIGH (ref 65–99)
Glucose-Capillary: 103 mg/dL — ABNORMAL HIGH (ref 65–99)

## 2015-06-03 MED ORDER — COLLAGENASE 250 UNIT/GM EX OINT
TOPICAL_OINTMENT | Freq: Every day | CUTANEOUS | Status: DC
  Administered 2015-06-03 – 2015-06-07 (×5): via TOPICAL
  Filled 2015-06-03 (×2): qty 30

## 2015-06-03 NOTE — Consult Note (Addendum)
WOC wound consult note Reason for Consult: Consult requested for right chronic foot wound. Pt is followed by [facility] team and had revascularization procedure performed yesterday; requested to recommend topical treatment for wound. Wound type: Full thickness  Measurement: 3X2cm Wound bed: 100% tightly adhered eschar, no odor or drainage. Drainage (amount, consistency, odor) No odor or drainage Periwound: Patchy area of loose dry scabbed skin surrounding wound approx 3X3cm, most of it lifts off easily revealing intact skin underneath. Dressing procedure/placement/frequency: Santyl for enzymatic debridement of nonviable tissue.  Discussed plan of care with patient and he verbalized understanding. Please re-consult if further assistance is needed.  Thank-you,  Julien Girt MSN, [place], [place], [place], [place]

## 2015-06-03 NOTE — Evaluation (Signed)
Physical Therapy Evaluation Patient Details Name: Donald Steele MRN: [id number] DOB: 1960/12/06 Today's Date: 06/03/2015   History of Present Illness  Pt is a 55 y.o. male with PMH consisting of DM and PVD. He was admitted 06-01-15 with R ischemic foot ulcer and underwent R fib-tib bypass graft 06-02-15.  Clinical Impression  Pt admitted with above diagnosis. Pt currently with functional limitations due to the deficits listed below (see PT Problem List). On eval, mobility limited by pain. He required min assist for bed mobility, min assist transfers, and min guard assist ambulation 15 feet with RW. Pt needed encouragement to increase gait distance. Pt will benefit from skilled PT to increase their independence and safety with mobility to allow discharge to the venue listed below.  With pain management, pt should progress quickly with mobility allowing for d/c home.     Follow Up Recommendations Home health PT;Supervision for mobility/OOB    Equipment Recommendations  Rolling walker with 5" wheels (Pt may have RW at home. He will check.)    Recommendations for Other Services       Precautions / Restrictions Precautions Precautions: Fall      Mobility  Bed Mobility Overal bed mobility: Needs Assistance Bed Mobility: Supine to Sit;Sit to Supine     Supine to sit: Min assist;HOB elevated Sit to supine: HOB elevated;Min guard   General bed mobility comments: assist with RLE during supine to sit  Transfers Overall transfer level: Needs assistance Equipment used: Rolling walker (2 wheeled) Transfers: Sit to/from Stand Sit to Stand: Min assist         General transfer comment: verbal cues for hand placement and sequencing. Assist to power up and stabilize initial stance.  Ambulation/Gait Ambulation/Gait assistance: Min guard Ambulation Distance (Feet): 15 Feet Assistive device: Rolling walker (2 wheeled) Gait Pattern/deviations: Step-to pattern;Antalgic;Decreased stride  length;Decreased weight shift to right Gait velocity: decreased Gait velocity interpretation: Below normal speed for age/gender General Gait Details: RLE externally rotated during gait with toes pointing laterally. Verbal cues to stay inside RW.  Stairs            Wheelchair Mobility    Modified Rankin (Stroke Patients Only)       Balance                                             Pertinent Vitals/Pain Pain Assessment: 0-10 Pain Score: 7  Pain Location: R foot Pain Descriptors / Indicators: Operative site guarding;Tender Pain Intervention(s): Limited activity within patient's tolerance;Monitored during session;Premedicated before session    Home Living Family/patient expects to be discharged to:: Private residence Living Arrangements: Parent;Other relatives Available Help at Discharge: Family;Available 24 hours/day Type of Home: House Home Access: Stairs to enter Entrance Stairs-Rails: None Entrance Stairs-Number of Steps: 5 Home Layout: Two level;Bed/bath upstairs Home Equipment: Cane - single point Additional Comments: Pt was able to ambulate household distances.    Prior Function Level of Independence: Independent               Hand Dominance        Extremity/Trunk Assessment                         Communication   Communication: No difficulties  Cognition Arousal/Alertness: Awake/alert Behavior During Therapy: WFL for tasks assessed/performed Overall Cognitive Status: Within Functional Limits for tasks assessed  General Comments      Exercises        Assessment/Plan    PT Assessment Patient needs continued PT services  PT Diagnosis Difficulty walking;Acute pain   PT Problem List Decreased activity tolerance;Decreased balance;Decreased mobility;Pain;Decreased knowledge of use of DME;Decreased skin integrity;Impaired sensation  PT Treatment Interventions DME instruction;Gait  training;Stair training;Functional mobility training;Therapeutic activities;Therapeutic exercise;Patient/family education;Balance training   PT Goals (Current goals can be found in the Care Plan section) Acute Rehab PT Goals Patient Stated Goal: home PT Goal Formulation: With patient Time For Goal Achievement: 06/03/15 Potential to Achieve Goals: Good    Frequency Min 3X/week   Barriers to discharge        Co-evaluation               End of Session Equipment Utilized During Treatment: Gait belt Activity Tolerance: Patient limited by pain Patient left: in bed;with call bell/phone within reach;with bed alarm set Nurse Communication: Mobility status         Time: [id number] PT Time Calculation (min) (ACUTE ONLY): 22 min   Charges:   PT Evaluation $PT Eval Moderate Complexity: 1 Procedure     PT G Codes:        Lorriane Shire 06/03/2015, 11:01 AM

## 2015-06-03 NOTE — Progress Notes (Signed)
Occupational Therapy Evaluation Patient Details Name: Donald Steele MRN: [id number] DOB: 11/22/1960 Today's Date: 06/03/2015    History of Present Illness Pt is a 55 y.o. male with PMH consisting of DM and PVD. He was admitted 06-01-15 with R ischemic foot ulcer and underwent R fib-tib bypass graft 06-02-15.   Clinical Impression   PTA, pt independent with ADL and mobility and worked paving roads driving a "roller". Pt prestns with below deficits and will benefit from OT to facilitate safe D/C home with intermittent S. Pt ambulated @ 150 ft with min guard A @ RW level. Pt reports feeling better this pm. Encouraged pt to ambulate this pm and over the weekend with nsg.     Follow Up Recommendations  No OT follow up;Supervision - Intermittent    Equipment Recommendations  3 in 1 bedside comode;Other (comment) (RW)    Recommendations for Other Services       Precautions / Restrictions Precautions Precautions: Fall      Mobility Bed Mobility Overal bed mobility: Modified Independent                Transfers Overall transfer level: Needs assistance Equipment used: Rolling walker (2 wheeled) Transfers: Sit to/from Stand Sit to Stand: Min guard         General transfer comment: vc for hand placement    Balance Overall balance assessment: Needs assistance   Sitting balance-Leahy Scale: Good       Standing balance-Leahy Scale: Fair                              ADL Overall ADL's : Needs assistance/impaired     Grooming: Set up   Upper Body Bathing: Set up;Sitting   Lower Body Bathing: Minimal assistance;Sit to/from stand   Upper Body Dressing : Set up   Lower Body Dressing: Minimal assistance;Sit to/from stand   Toilet Transfer: Min guard;RW;Ambulation   Toileting- Water quality scientist and Hygiene: Modified independent       Functional mobility during ADLs: Minimal assistance;Rolling walker General ADL Comments: Limited by pain.       Vision     Perception     Praxis      Pertinent Vitals/Pain Pain Assessment: 0-10 Pain Score: 7  Pain Location: RLE Pain Descriptors / Indicators: Burning;Discomfort;Tightness Pain Intervention(s): Limited activity within patient's tolerance     Hand Dominance     Extremity/Trunk Assessment Upper Extremity Assessment Upper Extremity Assessment: Overall WFL for tasks assessed   Lower Extremity Assessment Lower Extremity Assessment: Defer to PT evaluation   Cervical / Trunk Assessment Cervical / Trunk Assessment: Normal   Communication Communication Communication: No difficulties   Cognition Arousal/Alertness: Awake/alert Behavior During Therapy: WFL for tasks assessed/performed Overall Cognitive Status: Within Functional Limits for tasks assessed                     General Comments       Exercises Exercises: Other exercises Other Exercises Other Exercises: demonstrate how to use sheet to stretch calf due to c/o tighness. Encouraged pt to actively move RLE in bed to reduce stiffness when ambulating   Shoulder Instructions      Home Living Family/patient expects to be discharged to:: Private residence Living Arrangements: Parent;Other relatives Available Help at Discharge: Family;Available 24 hours/day Type of Home: House Home Access: Stairs to enter [employer] of Steps: 5 Entrance Stairs-Rails: None Home Layout: Two level;Bed/bath upstairs Alternate Level Stairs-Number of  Steps: 1 flight   Bathroom Shower/Tub: Tub/shower unit Shower/tub characteristics: Architectural technologist: Standard Bathroom Accessibility: Yes How Accessible: Accessible via walker Home Equipment: Arnold - single point   Additional Comments: Pt was able to ambulate household distances. 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      Prior Functioning/Environment Level of Independence: Independent             OT Diagnosis: Generalized weakness;Acute  pain   OT Problem List: Decreased strength;Decreased range of motion;Decreased activity tolerance;Impaired balance (sitting and/or standing);Decreased knowledge of use of DME or AE;Pain   OT Treatment/Interventions: Self-care/ADL training;DME and/or AE instruction;Therapeutic activities;Patient/family education;Therapeutic exercise    OT Goals(Current goals can be found in the care plan section) Acute Rehab OT Goals Patient Stated Goal: home OT Goal Formulation: With patient ADL Goals Pt Will Perform Lower Body Bathing: with caregiver independent in assisting;with supervision;sit to/from stand Pt Will Perform Lower Body Dressing: with supervision;with caregiver independent in assisting;sit to/from stand Pt Will Transfer to Toilet: with modified independence;bedside commode;ambulating Pt Will Perform Tub/Shower Transfer: Tub transfer;3 in 1;ambulating;rolling walker;with supervision;with caregiver independent in assisting  OT Frequency: Min 2X/week   Barriers to D/C:            Co-evaluation              End of Session Equipment Utilized During Treatment: Gait belt;Rolling walker Nurse Communication: Mobility status  Activity Tolerance: Patient tolerated treatment well Patient left: in bed;with call bell/phone within reach;with family/visitor present   Time: 1730-1758 OT Time Calculation (min): 28 min Charges:  OT General Charges $OT Visit: 1 Procedure OT Evaluation $OT Eval Moderate Complexity: 1 Procedure OT Treatments $Self Care/Home Management : 8-22 mins G-Codes:    Mariesha Venturella,HILLARY 07/02/2015, 6:10 PM   [facility], OTR/L  [phone] 07-02-2015

## 2015-06-03 NOTE — Progress Notes (Addendum)
VASCULAR LAB PRELIMINARY  ARTERIAL  ABI completed:    RIGHT    LEFT    PRESSURE WAVEFORM  PRESSURE WAVEFORM  BRACHIAL 133 Triphasic BRACHIAL 61 Dampened monophasic  DP 67 Severely dampened monophasic DP 58 Severely dampened monophasic  AT   AT    PT 98 Monophasic PT 65 Severely dampened monophasic  PER   PER    GREAT TOE  NA GREAT TOE  NA    RIGHT LEFT  ABI 0.74 0.49   The right ABi is suggestive of moderate arterial insufficiency at rest, the left ABI is suggestive of severe arterial insufficiency at rest.  There is a significant difference between brachial artery pressures, suggestive of a possible proximal left obstruction.  06/03/2015 2:57 PM Maudry Mayhew, RVT, RDCS, RDMS

## 2015-06-03 NOTE — Progress Notes (Signed)
  Echocardiogram 2D Echocardiogram has been performed.  Donald Steele 06/03/2015, 5:14 PM

## 2015-06-03 NOTE — Progress Notes (Signed)
Patient ID: Donald Steele, male   DOB: 1961/03/16, 55 y.o.   MRN: [id number]    Subjective:  Denies SSCP, palpitations or Dyspnea POD #1  Fem-tib bypass LLE  Objective:  Filed Vitals:   06/03/15 0031 06/03/15 0400 06/03/15 0800 06/03/15 0806  BP: 124/67 136/85 146/96   Pulse: 93 90 87   Temp: 98.2 F (36.8 C) 98.2 F (36.8 C)  98.3 F (36.8 C)  TempSrc: Axillary Oral  Oral  Resp: 20 14 15    Height:      Weight:      SpO2: 97% 100% 100%     Intake/Output from previous day:  Intake/Output Summary (Last 24 hours) at 06/03/15 1122 Last data filed at 06/03/15 0900  Gross per 24 hour  Intake 1882.08 ml  Output   2401 ml  Net -518.92 ml    Physical Exam: Affect appropriate Middle aged black male  HEENT: normal Neck supple with no adenopathy JVP normal bilateral bruits no thyromegaly Lungs clear with no wheezing and good diaphragmatic motion Heart:  S1/S2 no murmur, no rub, gallop or click PMI normal Abdomen: benighn, BS positve, no tenderness, no AAA no bruit.  No HSM or HJR Femoral bruits incisions from left fem-tib new Ulcer right heel No edema Neuro non-focal Skin warm and dry No muscular weakness   Lab Results: Basic Metabolic Panel:  Recent Labs  06/02/15 0300 06/03/15 0430  NA 141 142  K 4.0 4.1  CL 110 107  CO2 23 25  GLUCOSE 174* 140*  BUN 6 <5*  CREATININE 0.73 0.74  CALCIUM 8.9 8.8*   Liver Function Tests:  Recent Labs  06/01/15 2015  AST 18  ALT 17  ALKPHOS 72  BILITOT 0.6  PROT 7.4  ALBUMIN 3.9   No results for input(s): LIPASE, AMYLASE in the last 72 hours. CBC:  Recent Labs  06/02/15 0300 06/03/15 0430  WBC 7.5 10.0  HGB 12.0* 11.8*  HCT 35.8* 35.0*  MCV 86.9 87.7  PLT 218 198    Imaging: Dg Ang/ext/uni/or Right  06/02/2015  CLINICAL DATA:  Right foot ischemic ulcer. EXAM: RIGHT ANG/EXT/UNI/ OR COMPARISON:  None. FINDINGS: Single angiographic image of the right leg was obtained. There is a bypass graft along the  medial aspect of the upper calf. Bypass graft ties into the posterior tibial artery. Posterior tibial artery is patent to the ankle but is focal narrowing in the proximal posterior tibial artery. There is minor filling of the tibioperoneal trunk and the peroneal artery. IMPRESSION: Bypass graft and posterior tibial artery are patent. Focal narrowing in the posterior tibial artery. Electronically Signed   By: Markus Daft M.D.   On: 06/02/2015 18:11    Cardiac Studies:  ECG:  SR LVH with strain    Telemetry:  NSR no arrhtymia   Echo:   Medications:   . clobetasol ointment  1 application Topical BID  . collagenase   Topical Daily  . docusate sodium  100 mg Oral Daily  . enoxaparin (LOVENOX) injection  40 mg Subcutaneous Q24H  . furosemide  20 mg Oral Daily  . glipiZIDE  10 mg Oral BID AC  . insulin aspart  0-15 Units Subcutaneous TID WC  . losartan  50 mg Oral Daily  . pantoprazole  40 mg Oral Daily  . pregabalin  100 mg Oral BID     . sodium chloride 125 mL/hr (06/03/15 1029)    Assessment/Plan:  Vasc:  No cardiac complications post op Bruit: duplex yesterday with  plaque no stenosis HTN:  Started on losartan preop can increase dose to 100 mg if needed in 3 days DM:  Discussed low carb diet.  Target hemoglobin A1c is 6.5 or less.  Continue current medications. Abnormal ECG:  From LVH/strain will order echo to assess LVH and EF   Jenkins Rouge 06/03/2015, 11:22 AM

## 2015-06-03 NOTE — Progress Notes (Signed)
[facility] [place]  Subjective  - He is doing well over all, pain well controlled with medication, PO intake without N/V.     Objective 141/59 82 98.2 F (36.8 C) (Oral) 13 100%  Intake/Output Summary (Last 24 hours) at 06/03/15 1610 Last data filed at 06/03/15 1503  Gross per 24 hour  Intake 1782.08 ml  Output   1950 ml  Net -167.92 ml   Doppler right PT/DP Incisions right LE soft with out hematoma. Foot wound without change wound care nurse recommendations in note. Heart RRR Lungs non labored breathing   Assessment/Planning: POD # 1  RIGHT PROFUNDA FEMORIS TO POSTERIOR TIBIAL ARTERY BYPASS USING RIGHT NON-REVERSED GREATER SAPHENOUS VEIN, SUBCUTANEOUS TUNNEL   Disposition stable Will transfer to 2W Saline lock fluids taking PO's well Encourage mobiltiy, he did walk today VEIN HARVEST RIGHT GREATER SAPHENOUS VEIN ENDARTERECTOMY RIGHT PROFUNDA FEMORIS ARTERY INTRA OPERATIVE ARTERIOGRAM RIGHT LOWER LEG   Theda Sers, Zeno Hickel [place] 06/03/2015 4:10 PM --  Laboratory Lab Results:  Recent Labs  06/02/15 0300 06/03/15 0430  WBC 7.5 10.0  HGB 12.0* 11.8*  HCT 35.8* 35.0*  PLT 218 198   BMET  Recent Labs  06/02/15 0300 06/03/15 0430  NA 141 142  K 4.0 4.1  CL 110 107  CO2 23 25  GLUCOSE 174* 140*  BUN 6 <5*  CREATININE 0.73 0.74  CALCIUM 8.9 8.8*    COAG Lab Results  Component Value Date   INR 1.11 06/02/2015   INR 1.02 06/01/2015   No results found for: PTT

## 2015-06-03 NOTE — Care Management Note (Addendum)
Case Management Note  Patient Details  Name: Donald Steele MRN: [id number] Date of Birth: May 16, 1960  Subjective/Objective:    Date:06/03/15 Spoke with patient at the bedside.  Introduced self as Tourist information centre manager and explained role in discharge planning and how to be reached.  Verified patient lives in town, with spouse . Expressed potential need for rolling walker.  Verified patient anticipates to go home with family,  at time of discharge and will have full-time supervision by family at this time to best of their knowledge. Patient denied needing help with their medication.  Patient  is driven by family to MD appointments.  Verified patient has PCP Dr. Glade Lloyd. Patient chose [facility] for HHPT and rolling walker, referral given to [facility] with [facility] and [name].  Soc will begin 24-48 hrs post dc. Patient wants NCM to find out what the co pay is for HHPT and rolling walker, benefit check sent, awaiting results.   Co pay will be $22.00 per visit and also around $20 for the rolling walker, patient states this is good price for him.    Plan: CM will continue to follow for discharge planning and [facility] resources.                 Action/Plan:   Expected Discharge Date:                  Expected Discharge Plan:  [place]  In-House Referral:     Discharge planning Services  CM Consult  Post Acute Care Choice:  Home Health Choice offered to:  Patient  DME Arranged:  Walker rolling DME Agency:  Painter:  PT [facility] Agency:  [place]  Status of Service:  In process, will continue to follow  Medicare Important Message Given:    Date Medicare IM Given:    Medicare IM give by:    Date Additional Medicare IM Given:    Additional Medicare Important Message give by:     If discussed at [place] of Stay Meetings, dates discussed:    Additional Comments:  Zenon Mayo, RN 06/03/2015, 4:21 PM

## 2015-06-04 DIAGNOSIS — I420 Dilated cardiomyopathy: Secondary | ICD-10-CM

## 2015-06-04 DIAGNOSIS — I5042 Chronic combined systolic (congestive) and diastolic (congestive) heart failure: Secondary | ICD-10-CM | POA: Insufficient documentation

## 2015-06-04 LAB — GLUCOSE, CAPILLARY
GLUCOSE-CAPILLARY: 137 mg/dL — AB (ref 65–99)
GLUCOSE-CAPILLARY: 180 mg/dL — AB (ref 65–99)
GLUCOSE-CAPILLARY: 150 mg/dL — AB (ref 65–99)
GLUCOSE-CAPILLARY: 146 mg/dL — AB (ref 65–99)

## 2015-06-04 MED ORDER — CARVEDILOL 3.125 MG PO TABS
3.1250 mg | ORAL_TABLET | Freq: Two times a day (BID) | ORAL | Status: DC
  Administered 2015-06-04 – 2015-06-07 (×6): 3.125 mg via ORAL
  Filled 2015-06-04 (×6): qty 1

## 2015-06-04 NOTE — Progress Notes (Signed)
    SUBJECTIVE:  No SOB.  No chest pain   PHYSICAL EXAM Filed Vitals:   06/03/15 1954 06/03/15 2031 06/04/15 0500 06/04/15 0939  BP: 143/70 151/63 139/69 124/37  Pulse: 93 90 92 85  Temp: 98.2 F (36.8 C) 98.2 F (36.8 C) 99.1 F (37.3 C)   TempSrc: Oral Oral Oral   Resp: 25 20 18    Height:      Weight:      SpO2: 100% 99% 100%    General:  No distress Lungs:  Clear Heart:  RRR Abdomen:  Positive bowel sounds, no rebound no guarding Extremities:  Moderate right leg edema  LABS: No results found for: TROPONINI Results for orders placed or performed during the hospital encounter of 06/01/15 (from the past 24 hour(s))  Glucose, capillary     Status: Abnormal   Collection Time: 06/03/15 12:06 PM  Result Value Ref Range   Glucose-Capillary 161 (H) 65 - 99 mg/dL   Comment 1 Document in Chart   Glucose, capillary     Status: Abnormal   Collection Time: 06/03/15  4:29 PM  Result Value Ref Range   Glucose-Capillary 103 (H) 65 - 99 mg/dL  Glucose, capillary     Status: None   Collection Time: 06/03/15  9:27 PM  Result Value Ref Range   Glucose-Capillary 86 65 - 99 mg/dL  Glucose, capillary     Status: Abnormal   Collection Time: 06/04/15  6:18 AM  Result Value Ref Range   Glucose-Capillary 137 (H) 65 - 99 mg/dL    Intake/Output Summary (Last 24 hours) at 06/04/15 0959 Last data filed at 06/04/15 0900  Gross per 24 hour  Intake    480 ml  Output   2125 ml  Net  -1645 ml    ECHO:  - Left ventricle: The cavity size was normal. Systolic function was moderately reduced. The estimated ejection fraction was in the range of 35% to 40%. Moderate diffuse hypokinesis. There is akinesis of the basalinferoseptal myocardium. There is akinesis of the basal-mid myocardium. There is akinesis of the entireinferolateral myocardium. There was an increased relative contribution of atrial contraction to ventricular filling. Doppler parameters are consistent with abnormal  left ventricular relaxation (grade 1 diastolic dysfunction). - Aortic valve: Trileaflet; mildly thickened, mildly calcified leaflets. - Mitral valve: There was mild regurgitation. - Tricuspid valve: There was trivial regurgitation.  ASSESSMENT AND PLAN:  CARDIOMYOPATHY:  This is a new finding.  He has no current ischemia symptoms and no evidence of volume overload.  He will need an ischemia work up but this can happen as an out patient.   Now on Cozaar.   I will add a low dose of beta blocker.    We will follow as an outpatient with probable Lexiscan Myoview.  HTN:  This is being managed in the context of treating his CHF   Minus Breeding 06/04/2015 9:59 AM

## 2015-06-04 NOTE — Progress Notes (Addendum)
Progress Note    06/04/2015 9:36 AM 2 Days Post-Op  Subjective:  Foot is feeling better  Tm 99.1 HR 80's-90's NSR 99991111 systolic [id number] RA  Filed Vitals:   06/03/15 2031 06/04/15 0500  BP: 151/63 139/69  Pulse: 90 92  Temp: 98.2 F (36.8 C) 99.1 F (37.3 C)  Resp: 20 18    Physical Exam: Cardiac:  regular Lungs:  Non labored Incisions:  Right leg incisions are c/d/i Extremities:  Left foot with brisk DP/PT doppler signals;  Abdomen:  Right foot wound stable  CBC    Component Value Date/Time   WBC 10.0 06/03/2015 0430   RBC 3.99* 06/03/2015 0430   HGB 11.8* 06/03/2015 0430   HCT 35.0* 06/03/2015 0430   PLT 198 06/03/2015 0430   MCV 87.7 06/03/2015 0430   MCH 29.6 06/03/2015 0430   MCHC 33.7 06/03/2015 0430   RDW 12.4 06/03/2015 0430    BMET    Component Value Date/Time   NA 142 06/03/2015 0430   K 4.1 06/03/2015 0430   CL 107 06/03/2015 0430   CO2 25 06/03/2015 0430   GLUCOSE 140* 06/03/2015 0430   BUN <5* 06/03/2015 0430   CREATININE 0.74 06/03/2015 0430   CALCIUM 8.8* 06/03/2015 0430   GFRNONAA >60 06/03/2015 0430   GFRAA >60 06/03/2015 0430    INR    Component Value Date/Time   INR 1.11 06/02/2015 0300     Intake/Output Summary (Last 24 hours) at 06/04/15 0936 Last data filed at 06/04/15 [id number]  Gross per 24 hour  Intake    480 ml  Output   1775 ml  Net  -1295 ml   ABI's 06/03/15:  RIGHT    LEFT    PRESSURE WAVEFORM  PRESSURE WAVEFORM  BRACHIAL 133 Triphasic BRACHIAL 61 Dampened monophasic  DP 67 Severely dampened monophasic DP 58 Severely dampened monophasic  AT   AT    PT 98 Monophasic PT 65 Severely dampened monophasic  PER   PER    GREAT TOE  NA GREAT TOE  NA    RIGHT LEFT  ABI 0.74 0.49       TTE findings 06/03/15: Study Conclusions  - Left ventricle: The cavity size was normal. Systolic function was moderately reduced. The estimated ejection fraction was in the  range of 35% to 40%. Moderate diffuse hypokinesis. There is akinesis of the basalinferoseptal myocardium. There is akinesis of the basal-mid myocardium. There is akinesis of the entireinferolateral myocardium. There was an increased relative contribution of atrial contraction to ventricular filling. Doppler parameters are consistent with abnormal left ventricular relaxation (grade 1 diastolic dysfunction). - Aortic valve: Trileaflet; mildly thickened, mildly calcified leaflets. - Mitral valve: There was mild regurgitation. - Tricuspid valve: There was trivial regurgitation.  Assessment:  55 y.o. male is s/p:  Right profunda femoris to PT artery bypass using right non-reversed saphenous vein with subcutaneous tunnel  2 Days Post-Op  Plan: -pt with patent right leg bypass with brisk doppler flow right DP/PT -right ABI is improved from 0.38 to 0.74 -continue Santyl to right foot wound -needs to mobilize more.  Did get up and move a little yesterday. -DVT prophylaxis:  Lovenox -TTE yesterday - EF 35-40%.  Cardiology following   Leontine Locket, PA-C [facility] [phone] 06/04/2015 9:36 AM  Addendum  I have independently interviewed and examined the patient, and I agree with the physician assistant's findings.  Strong biphasic signal.  Pt still not moving much.  Suspect likely D/C home likely  early part of next week.  Adele Barthel, MD [facility] [place] Office: [phone] Pager: [phone]  06/04/2015, 10:23 AM

## 2015-06-04 NOTE — Progress Notes (Signed)
Physical Therapy Treatment Patient Details Name: Donald Steele MRN: [id number] DOB: 1960-05-09 Today's Date: June 09, 2015    History of Present Illness Pt is a 55 y.o. male with PMH consisting of DM and PVD. He was admitted 06-01-15 with R ischemic foot ulcer and underwent R fib-tib bypass graft 06-02-15.    PT Comments    Patient making improvements on mobility and gait.  Pain slightly improved today, impacting gait tolerance.  Follow Up Recommendations  Home health PT;Supervision for mobility/OOB     Equipment Recommendations  Rolling walker with 5" wheels    Recommendations for Other Services       Precautions / Restrictions Precautions Precautions: Fall Restrictions Weight Bearing Restrictions: No    Mobility  Bed Mobility Overal bed mobility: Modified Independent                Transfers Overall transfer level: Needs assistance Equipment used: Rolling walker (2 wheeled) Transfers: Sit to/from Stand Sit to Stand: Supervision         General transfer comment: Verbal cues for hand placement and safety.  Ambulation/Gait Ambulation/Gait assistance: Min guard Ambulation Distance (Feet): 62 Feet Assistive device: Rolling walker (2 wheeled) Gait Pattern/deviations: Step-to pattern;Decreased stance time - right;Decreased step length - left;Decreased step length - right;Decreased stride length;Decreased weight shift to right;Antalgic;Trunk flexed Gait velocity: decreased Gait velocity interpretation: Below normal speed for age/gender General Gait Details: Verbal cues for safe use of RW, especially when returning to sitting.  Cues to look forward during gait, and stay close to RW.   Stairs            Wheelchair Mobility    Modified Rankin (Stroke Patients Only)       Balance                                    Cognition Arousal/Alertness: Awake/alert Behavior During Therapy: WFL for tasks assessed/performed Overall Cognitive Status:  Within Functional Limits for tasks assessed (Decreased safety awareness - ? pta)                      Exercises      General Comments        Pertinent Vitals/Pain Pain Assessment: 0-10 Pain Score: 5  Pain Location: RLE at incision site Pain Descriptors / Indicators: Sore;Sharp Pain Intervention(s): Monitored during session;Repositioned    Home Living                      Prior Function            PT Goals (current goals can now be found in the care plan section) Acute Rehab PT Goals Patient Stated Goal: home Progress towards PT goals: Progressing toward goals    Frequency  Min 3X/week    PT Plan Current plan remains appropriate    Co-evaluation             End of Session Equipment Utilized During Treatment: Gait belt Activity Tolerance: Patient tolerated treatment well;Patient limited by pain Patient left: in bed;with call bell/phone within reach;with family/visitor present (Sitting EOB)     Time: [id number] PT Time Calculation (min) (ACUTE ONLY): 11 min  Charges:  $Gait Training: 8-22 mins                    G Codes:      Despina Pole 06/09/2015, 6:09 PM Carita Pian.  Sanjuana Kava, [place] Pager [phone]

## 2015-06-05 LAB — TYPE AND SCREEN
ABO/RH(D): O POS
Antibody Screen: NEGATIVE
UNIT DIVISION: 0
UNIT DIVISION: 0

## 2015-06-05 LAB — GLUCOSE, CAPILLARY
GLUCOSE-CAPILLARY: 109 mg/dL — AB (ref 65–99)
GLUCOSE-CAPILLARY: 227 mg/dL — AB (ref 65–99)
Glucose-Capillary: 200 mg/dL — ABNORMAL HIGH (ref 65–99)
Glucose-Capillary: 185 mg/dL — ABNORMAL HIGH (ref 65–99)

## 2015-06-05 NOTE — Progress Notes (Signed)
    SUBJECTIVE:  No SOB.  No chest pain.  Ambulated slightly today and legs as still sore.     PHYSICAL EXAM Filed Vitals:   06/04/15 1648 06/04/15 2022 06/05/15 0524 06/05/15 0934  BP:  156/69 139/58 107/51  Pulse: 90 97 83 73  Temp:  98.4 F (36.9 C) 98.2 F (36.8 C)   TempSrc:  Oral Oral   Resp:  20 20   Height:      Weight:      SpO2:  100% 100%    General:  No distress Lungs:  Clear Heart:  RRR Abdomen:  Positive bowel sounds, no rebound no guarding Extremities:  Moderate right leg edema improved  LABS: No results found for: TROPONINI Results for orders placed or performed during the hospital encounter of 06/01/15 (from the past 24 hour(s))  Glucose, capillary     Status: Abnormal   Collection Time: 06/04/15 11:31 AM  Result Value Ref Range   Glucose-Capillary 180 (H) 65 - 99 mg/dL  Glucose, capillary     Status: Abnormal   Collection Time: 06/04/15  4:42 PM  Result Value Ref Range   Glucose-Capillary 150 (H) 65 - 99 mg/dL  Glucose, capillary     Status: Abnormal   Collection Time: 06/04/15 10:03 PM  Result Value Ref Range   Glucose-Capillary 146 (H) 65 - 99 mg/dL  Glucose, capillary     Status: Abnormal   Collection Time: 06/05/15  6:24 AM  Result Value Ref Range   Glucose-Capillary 200 (H) 65 - 99 mg/dL    Intake/Output Summary (Last 24 hours) at 06/05/15 1014 Last data filed at 06/05/15 0800  Gross per 24 hour  Intake    480 ml  Output   1450 ml  Net   -970 ml    ASSESSMENT AND PLAN:  CARDIOMYOPATHY:  This is a new finding.  He has no current ischemia symptoms and no evidence of volume overload.  He will need an ischemia work up but this can happen as an out patient.   Now on Cozaar.   Low does of Coreg added yesterday.    We will follow as an outpatient with probable Lexiscan Myoview.  No change in meds today but possible titration again before discharge.  BP is somewhat labile.   HTN:  This is being managed in the context of treating his  CHF   Minus Breeding 06/05/2015 10:14 AM

## 2015-06-05 NOTE — Progress Notes (Signed)
Patient up to chair this AM. Will continue to monitor patient. Swannie Milius, Bettina Gavia RN

## 2015-06-05 NOTE — Progress Notes (Signed)
Patient ambulated to door and back to bed in room with walker. Declined to ambulate in hallway at this time, will monitor patient. Stephene Alegria, Bettina Gavia RN

## 2015-06-05 NOTE — Progress Notes (Addendum)
  Progress Note    06/05/2015 7:35 AM 3 Days Post-Op  Subjective:  Feeling better today-did not walk in the halls yesterday, but walked in the room  Afebrile HR 80's-90's NSR [id number] systolic [id number] RA  Filed Vitals:   06/04/15 2022 06/05/15 0524  BP: 156/69 139/58  Pulse: 97 83  Temp: 98.4 F (36.9 C) 98.2 F (36.8 C)  Resp: 20 20    Physical Exam: Cardiac:  regular Lungs:  Non labored Incisions:  Right leg incisions are healing nicely Extremities:  Brisk right PT doppler signal.  Bandage in place right foot that was not removed as foot was just dressed this morning.   CBC    Component Value Date/Time   WBC 10.0 06/03/2015 0430   RBC 3.99* 06/03/2015 0430   HGB 11.8* 06/03/2015 0430   HCT 35.0* 06/03/2015 0430   PLT 198 06/03/2015 0430   MCV 87.7 06/03/2015 0430   MCH 29.6 06/03/2015 0430   MCHC 33.7 06/03/2015 0430   RDW 12.4 06/03/2015 0430    BMET    Component Value Date/Time   NA 142 06/03/2015 0430   K 4.1 06/03/2015 0430   CL 107 06/03/2015 0430   CO2 25 06/03/2015 0430   GLUCOSE 140* 06/03/2015 0430   BUN <5* 06/03/2015 0430   CREATININE 0.74 06/03/2015 0430   CALCIUM 8.8* 06/03/2015 0430   GFRNONAA >60 06/03/2015 0430   GFRAA >60 06/03/2015 0430    INR    Component Value Date/Time   INR 1.11 06/02/2015 0300     Intake/Output Summary (Last 24 hours) at 06/05/15 0735 Last data filed at 06/05/15 0525  Gross per 24 hour  Intake    720 ml  Output   1600 ml  Net   -880 ml     Assessment:  55 y.o. male is s/p:  Right profunda femoris to PT artery bypass using right non-reversed saphenous vein with subcutaneous tunnel   3 Days Post-Op  Plan: -pt doing well this morning with patent bypass with brisk right PT doppler signal. -ambulate more today and plan discharge home in the morning -per cardiology-new cardiomyopathy without current ischemia sx & no evidence of volume overload.  He will need ischemia work up but this can be as an  outpatient.  He is on Cozaar and beta blocker (Coreg) was added to his medication regimen.  Cards will follow with probable Lexiscan Myoview. -DVT prophylaxis:  Lovenox   Leontine Locket, PA-C [facility] [phone] 06/05/2015 7:35 AM    Addendum  I have independently interviewed and examined the patient, and I agree with the physician assistant's findings.  Awaiting ABI.  Home once ambulating more, hopefully in the next two days.  Adele Barthel, MD [facility] [place] Office: [phone] Pager: [phone]  06/05/2015, 10:03 AM

## 2015-06-06 ENCOUNTER — Other Ambulatory Visit: Payer: Self-pay | Admitting: Physician Assistant

## 2015-06-06 ENCOUNTER — Encounter (HOSPITAL_COMMUNITY): Payer: Self-pay | Admitting: Vascular Surgery

## 2015-06-06 DIAGNOSIS — I422 Other hypertrophic cardiomyopathy: Secondary | ICD-10-CM

## 2015-06-06 LAB — GLUCOSE, CAPILLARY
GLUCOSE-CAPILLARY: 171 mg/dL — AB (ref 65–99)
GLUCOSE-CAPILLARY: 219 mg/dL — AB (ref 65–99)
GLUCOSE-CAPILLARY: 112 mg/dL — AB (ref 65–99)
GLUCOSE-CAPILLARY: 216 mg/dL — AB (ref 65–99)

## 2015-06-06 NOTE — Telephone Encounter (Signed)
Pt states he is doing ok, still in the hospital and is supposed to get released tomorrow. Pt states he mentioned nothing about cardiology

## 2015-06-06 NOTE — Progress Notes (Signed)
Occupational Therapy Treatment Patient Details Name: Ustin Tennenbaum MRN: [id number] DOB: 1960-05-05 Today's Date: 06/06/2015    History of present illness Pt is a 55 y.o. male with PMH consisting of DM and PVD. He was admitted 06-01-15 with R ischemic foot ulcer and underwent R fib-tib bypass graft 06-02-15.   OT comments  Pt. Making gains with acute OT but requires max encouragement for use of RLE during functional mobility.  Girlfriend present and will be assisting at d/c.  Difficulty providing education to both as they were often distracted with personal disagreements throughout the entire session. Note d/c likely tomorrow.  Will follow acutely.    Follow Up Recommendations  No OT follow up;Supervision - Intermittent    Equipment Recommendations  3 in 1 bedside comode;Other (comment)    Recommendations for Other Services      Precautions / Restrictions Precautions Precautions: Fall Restrictions Weight Bearing Restrictions: No       Mobility Bed Mobility               General bed mobility comments: seated eob upon arrival into room  Transfers Overall transfer level: Needs assistance Equipment used: Rolling walker (2 wheeled) Transfers: Sit to/from [facility] Sit to Stand: Min guard Stand pivot transfers: Min guard       General transfer comment: cues for utilizing RLE, keeping head up and looking ahead during ambulation.  cues for hand placement on arm rests and backing up to chair before sitting down.      Balance                                   ADL Overall ADL's : Needs assistance/impaired     Grooming: Min guard;Standing Grooming Details (indicate cue type and reason): simulated while amb. in room       Lower Body Bathing Details (indicate cue type and reason): pts. girlfriend present and states she is available to assist as needed        Lower Body Dressing Details (indicate cue type and reason): pts. girlfriend  present and states she is available to assist as needed  Toilet Transfer: Min guard;RW;Ambulation   Toileting- Clothing Manipulation and Hygiene: Modified independent       Functional mobility during ADLs: Min guard;Rolling walker General ADL Comments: required encouragement for participation.  remains guarded with use of RLE.  reviewed WBAT and reviewed need and benefits of placing weight through LE.  girlfriend present and educated on safety with functional mobility.  they both remained distracted with continuous disagreements with eachother.        Vision                     Perception     Praxis      Cognition     Overall Cognitive Status: Within Functional Limits for tasks assessed                       Extremity/Trunk Assessment               Exercises     Shoulder Instructions       General Comments      Pertinent Vitals/ Pain       Pain Assessment:  (did not rate but states he is in pain and requested meds) Pain Location: RLE Pain Descriptors / Indicators: Aching Pain Intervention(s): Monitored during session;Patient requesting pain  meds-RN notified;Repositioned  Home Living                                          Prior Functioning/Environment              Frequency Min 2X/week     Progress Toward Goals  OT Goals(current goals can now be found in the care plan section)  Progress towards OT goals: Progressing toward goals     Plan Discharge plan remains appropriate    Co-evaluation                 End of Session Equipment Utilized During Treatment: Gait belt;Rolling walker   Activity Tolerance Patient tolerated treatment well   Patient Left in chair;with call bell/phone within reach;with family/visitor present   Nurse Communication Other (comment) (reviewed need for meds, and pts. amb. status in room)        Time: [id number] OT Time Calculation (min): 19 min  Charges: OT General  Charges $OT Visit: 1 Procedure OT Treatments $Self Care/Home Management : 8-22 mins  Janice Coffin, COTA/L 06/06/2015, 11:10 AM

## 2015-06-06 NOTE — Progress Notes (Signed)
Patient in chair for lunch, stand and pivot x1 assist. Will monitor patient. Kyan Giannone, Bettina Gavia RN

## 2015-06-06 NOTE — Progress Notes (Addendum)
  [facility] Progress Note  Subjective  - POD #4  Some soreness with incisions. Denies chest pain or shortness of breath.   Objective Filed Vitals:   06/05/15 1920 06/06/15 0438  BP: 119/68 126/57  Pulse: 80 84  Temp: 97.8 F (36.6 C) 98.3 F (36.8 C)  Resp: 20 20    Intake/Output Summary (Last 24 hours) at 06/06/15 0723 Last data filed at 06/06/15 0608  Gross per 24 hour  Intake    700 ml  Output    350 ml  Net    350 ml   Right groin and leg incisions clean and intact. Right foot dressing clean. Right foot is warm with brisk right PT doppler flow.   Assessment/Planning: 55 y.o. male is s/p: right profunda femoris to PT artery bypass using right non-reversed saphenous vein with subcutaneous tunnel. 4 Days Post-Op   Patent bypass. Has only ambulated in room. Encourage ambulation in halls today.  New cardiomyopathy: asymptomatic this am. Appreciate cardiology following. Plan for outpatient ischemia workup.  Dispo: Plan for d/c in next 1-2 days once ambulation has improved.   Alvia Grove 06/06/2015 7:23 AM --  Laboratory CBC    Component Value Date/Time   WBC 10.0 06/03/2015 0430   HGB 11.8* 06/03/2015 0430   HCT 35.0* 06/03/2015 0430   PLT 198 06/03/2015 0430    BMET    Component Value Date/Time   NA 142 06/03/2015 0430   K 4.1 06/03/2015 0430   CL 107 06/03/2015 0430   CO2 25 06/03/2015 0430   GLUCOSE 140* 06/03/2015 0430   BUN <5* 06/03/2015 0430   CREATININE 0.74 06/03/2015 0430   CALCIUM 8.8* 06/03/2015 0430   GFRNONAA >60 06/03/2015 0430   GFRAA >60 06/03/2015 0430    COAG Lab Results  Component Value Date   INR 1.11 06/02/2015   INR 1.02 06/01/2015   No results found for: PTT  Antibiotics Anti-infectives    Start     Dose/Rate Route Frequency Ordered Stop   06/02/15 1930  cefUROXime (ZINACEF) 1.5 g in dextrose 5 % 50 mL IVPB     1.5 g 100 mL/hr over 30 Minutes Intravenous Every 12 hours 06/02/15 1541 06/03/15  0709   06/02/15 0600  cefUROXime (ZINACEF) 1.5 g in dextrose 5 % 50 mL IVPB  Status:  Discontinued     1.5 g 100 mL/hr over 30 Minutes Intravenous On call to O.R. 06/01/15 1740 06/01/15 1758   06/02/15 0600  cefUROXime (ZINACEF) 1.5 g in dextrose 5 % 50 mL IVPB     1.5 g 100 mL/hr over 30 Minutes Intravenous To Short Stay 06/01/15 1758 06/02/15 [place], PA-C [facility] Office: [phone] Pager: [phone] 06/06/2015 7:23 AM  Agree with above assessment Bypasses functioning nicely with 3+ pulse in graft Incisions healing well Ulcerations right foot are stable Needs to ambulate more today  Hopefully can increase ambulation today and DC home on Tuesday Appreciate cardiology's help and they will follow up in office for ischemia workup I will see in office in 2 weeks

## 2015-06-06 NOTE — Progress Notes (Signed)
Physical Therapy Treatment Patient Details Name: Tymar Pulgarin MRN: [id number] DOB: June 01, 1960 Today's Date: 06/06/2015    History of Present Illness Pt is a 55 y.o. male with PMH consisting of DM and PVD. He was admitted 06-01-15 with R ischemic foot ulcer and underwent R fib-tib bypass graft 06-02-15.    PT Comments    Pt performed 120 ft RW with min to min guard assist with emphasis on weight bearing to RLE.  Pt performed transfers with poor safety of hand placement requiring cues to correct.  Pt educated on stair training, however declined stair training today reports he will perform tomorrow.    Follow Up Recommendations  Home health PT;Supervision for mobility/OOB     Equipment Recommendations  Rolling walker with 5" wheels    Recommendations for Other Services       Precautions / Restrictions Precautions Precautions: Fall Restrictions Weight Bearing Restrictions: No    Mobility  Bed Mobility Overal bed mobility: Modified Independent Bed Mobility: Sit to Supine       Sit to supine: Modified independent (Device/Increase time)   General bed mobility comments: seated eob upon arrival into room  Transfers Overall transfer level: Needs assistance Equipment used: Rolling walker (2 wheeled) Transfers: Sit to/from Stand Sit to Stand: Supervision Stand pivot transfers: Min guard       General transfer comment: Pt required cues to push from recliner vs. pulling on RW to improve safety and prevent LOB.  Pt required re-education on hand placement.    Ambulation/Gait Ambulation/Gait assistance: Min guard;Min assist Ambulation Distance (Feet): 124 Feet Assistive device: Rolling walker (2 wheeled) Gait Pattern/deviations: Step-to pattern;Decreased step length - left;Decreased stance time - right;Decreased stride length;Antalgic;Narrow base of support;Trunk flexed Gait velocity: decreased Gait velocity interpretation: Below normal speed for age/gender General Gait  Details: Pt required VCs for head control and forward gaze.  Cues for gt symmetry with step to pattern, cues to shift weight to RLE in stance phase.     Stairs Stairs:  (Educated pt on correct technique pt declined to participate in stair training today reports he will participate tomorrow.  )          Wheelchair Mobility    Modified Rankin (Stroke Patients Only)       Balance Overall balance assessment: Needs assistance   Sitting balance-Leahy Scale: Good       Standing balance-Leahy Scale: Fair                      Cognition Arousal/Alertness: Awake/alert Behavior During Therapy: WFL for tasks assessed/performed Overall Cognitive Status: Within Functional Limits for tasks assessed                      Exercises      General Comments        Pertinent Vitals/Pain Pain Assessment: Faces Faces Pain Scale: Hurts little more Pain Location: RLE Pain Descriptors / Indicators: Guarding;Grimacing Pain Intervention(s): Monitored during session;Repositioned    Home Living                      Prior Function            PT Goals (current goals can now be found in the care plan section) Acute Rehab PT Goals Patient Stated Goal: home Potential to Achieve Goals: Good Progress towards PT goals: Progressing toward goals    Frequency  Min 3X/week    PT Plan Current plan remains appropriate  Co-evaluation             End of Session Equipment Utilized During Treatment: Gait belt   Patient left: in bed;with call bell/phone within reach;with family/visitor present     Time: [id number] PT Time Calculation (min) (ACUTE ONLY): 15 min  Charges:  $Gait Training: 8-22 mins                    G Codes:      Cristela Blue 06-18-2015, 1:42 PM Governor Rooks, PTA pager [phone]

## 2015-06-06 NOTE — Progress Notes (Signed)
Patient Name: Donald Steele Date of Encounter: 06/06/2015   SUBJECTIVE  Feeling well. No chest pain, sob or palpitations.   CURRENT MEDS . carvedilol  3.125 mg Oral BID WC  . clobetasol ointment  1 application Topical BID  . collagenase   Topical Daily  . docusate sodium  100 mg Oral Daily  . enoxaparin (LOVENOX) injection  40 mg Subcutaneous Q24H  . furosemide  20 mg Oral Daily  . glipiZIDE  10 mg Oral BID AC  . insulin aspart  0-15 Units Subcutaneous TID WC  . losartan  50 mg Oral Daily  . pantoprazole  40 mg Oral Daily  . pregabalin  100 mg Oral BID    OBJECTIVE  Filed Vitals:   06/05/15 1702 06/05/15 1920 06/06/15 0438 06/06/15 1000  BP: 120/57 119/68 126/57 114/48  Pulse: 74 80 84   Temp:  97.8 F (36.6 C) 98.3 F (36.8 C)   TempSrc:  Oral Oral   Resp:  20 20   Height:      Weight:      SpO2: 100% 100% 95%     Intake/Output Summary (Last 24 hours) at 06/06/15 1049 Last data filed at 06/06/15 0800  Gross per 24 hour  Intake    480 ml  Output    400 ml  Net     80 ml   Filed Weights   06/01/15 1733 06/02/15 0411 06/02/15 1535  Weight: 177 lb 14.6 oz (80.7 kg) 177 lb 14.6 oz (80.7 kg) 177 lb 14.6 oz (80.7 kg)    PHYSICAL EXAM  General: Pleasant, NAD. Neuro: Alert and oriented X 3. Moves all extremities spontaneously. Psych: Normal affect. HEENT:  Normal  Neck: Supple without JVD. Bilateral Bruits.  Lungs:  Resp regular and unlabored, CTA. Heart: RRR no s3, s4, or murmurs. Abdomen: Soft, non-tender, non-distended, BS + x 4.  Extremities: No clubbing, cyanosis or edema. DP + and equal bilaterally. R ankle wrapped.   Accessory Clinical Findings  TELE  Sinus rhythm with PVCs  Radiology/Studies  Dg Ang/ext/uni/or Right  06/02/2015  CLINICAL DATA:  Right foot ischemic ulcer. EXAM: RIGHT ANG/EXT/UNI/ OR COMPARISON:  None. FINDINGS: Single angiographic image of the right leg was obtained. There is a bypass graft along the medial aspect of the upper  calf. Bypass graft ties into the posterior tibial artery. Posterior tibial artery is patent to the ankle but is focal narrowing in the proximal posterior tibial artery. There is minor filling of the tibioperoneal trunk and the peroneal artery. IMPRESSION: Bypass graft and posterior tibial artery are patent. Focal narrowing in the posterior tibial artery. Electronically Signed   By: Markus Daft M.D.   On: 06/02/2015 18:11   Dg Foot Complete Right  05/26/2015  CLINICAL DATA:  Per pt: for a month there is a skin infection on the top and side and on the ankle of the right foot. Skin irritation on the right foot, superior aspect, lateral with irritation extending to the ankle. No injury. Patient is a diabetic EXAM: RIGHT FOOT COMPLETE - 3+ VIEW COMPARISON:  None. FINDINGS: No fracture. There are no areas of bone resorption to suggest osteomyelitis. Joints are normally spaced and aligned. There is mild diffuse soft tissue edema most evident of the forefoot. No soft tissue air. Small dorsal plantar calcaneal spurs are noted. IMPRESSION: 1. No fracture. No significant joint abnormality. No evidence of osteomyelitis. Electronically Signed   By: Lajean Manes M.D.   On: 05/26/2015 20:00  ASSESSMENT AND PLAN  1.  Congestive dilated cardiomyopathy - New diagnosis. No anginal symptoms or volume overload. Ef of 35-40%, grade 1 DD, mild MR.  - Continue coreg and losartan at current dose, unable to titrate today due to soft low BP. Plan for outpatient stress test.   2. HTN - relatively stable  3. Vascular: s/p right profunda femoris to PT artery bypass using right non-reversed saphenous vein with subcutaneous tunnel.   Jarrett Soho PA-C Pager [phone]

## 2015-06-07 LAB — GLUCOSE, CAPILLARY: Glucose-Capillary: 220 mg/dL — ABNORMAL HIGH (ref 65–99)

## 2015-06-07 MED ORDER — COLLAGENASE 250 UNIT/GM EX OINT: TOPICAL_OINTMENT | Freq: Every day | CUTANEOUS | Status: DC

## 2015-06-07 MED ORDER — OXYCODONE HCL 5 MG PO TABS: 5.0000 mg | ORAL_TABLET | ORAL | Status: DC | PRN

## 2015-06-07 MED ORDER — ASPIRIN EC 81 MG PO TBEC: 81.0000 mg | DELAYED_RELEASE_TABLET | Freq: Every day | ORAL | Status: DC

## 2015-06-07 MED ORDER — ATORVASTATIN CALCIUM 10 MG PO TABS
10.0000 mg | ORAL_TABLET | Freq: Every day | ORAL | Status: DC
Start: 2015-06-07 — End: 2015-07-14

## 2015-06-07 NOTE — Care Management Note (Signed)
Case Management Note Previous CM note initiated by Tomi Bamberger RN, CM  Patient Details  Name: Donald Steele MRN: [id number] Date of Birth: Aug 21, 1960  Subjective/Objective:    Date:06/03/15 Spoke with patient at the bedside.  Introduced self as Tourist information centre manager and explained role in discharge planning and how to be reached.  Verified patient lives in town, with spouse . Expressed potential need for rolling walker.  Verified patient anticipates to go home with family,  at time of discharge and will have full-time supervision by family at this time to best of their knowledge. Patient denied needing help with their medication.  Patient  is driven by family to MD appointments.  Verified patient has PCP Dr. Glade Lloyd. Patient chose [facility] for HHPT and rolling walker, referral given to [facility] with [facility] and [name].  Soc will begin 24-48 hrs post dc. Patient wants NCM to find out what the co pay is for HHPT and rolling walker, benefit check sent, awaiting results.   Co pay will be $22.00 per visit and also around $20 for the rolling walker, patient states this is good price for him.    Plan: CM will continue to follow for discharge planning and [facility] resources.                 Action/Plan: s/p: right profunda femoris to PT artery bypass plan to return home with [facility] services  Expected Discharge Date:     06/07/15             Expected Discharge Plan:  [place]  In-House Referral:     Discharge planning Services  CM Consult  Post Acute Care Choice:  Home Health Choice offered to:  Patient  DME Arranged:  Walker rolling DME Agency:  [place]:  PT [facility] Agency:  [place]  Status of Service:  In process, will continue to follow  Medicare Important Message Given:    Date Medicare IM Given:    Medicare IM give by:    Date Additional Medicare IM Given:    Additional Medicare Important Message give by:     If discussed at [place] of  Stay Meetings, dates discussed:    Discharge Disposition: Home/home health   Additional Comments:  06/07/15- 1030- [name] RN, BSN - pt for d/c home today- spoke with pt at bedside- per conversation pt remains agreeable to HHPT states he is ok with own drsg changes and does not need a home RN- spoke with Lelan Pons with [facility] regarding [place] needs and d/c for today- pt also needs RW for home- spoke with [name] with [facility] - who will deliver RW to room prior to discharge.   [name], RN 06/07/2015, 10:37 AM

## 2015-06-07 NOTE — Progress Notes (Signed)
  [facility] Progress Note  Subjective  - POD #5  Doing well. Ready to go home.   Objective Filed Vitals:   06/06/15 2038 06/07/15 0430  BP: 135/58 117/58  Pulse: 76 80  Temp: 98.3 F (36.8 C) 98.3 F (36.8 C)  Resp: 20 18    Intake/Output Summary (Last 24 hours) at 06/07/15 [id number] Last data filed at 06/06/15 1732  Gross per 24 hour  Intake    720 ml  Output    250 ml  Net    470 ml   Right groin and lower leg incisions clean and intact. Small hematoma right groin incision.  Brisk right PT and DP doppler flow.  Right foot wound clean and dry.   Assessment/Planning: 55 y.o. male is s/p: right profunda femoris to PT artery bypass using right non-reversed saphenous vein with subcutaneous tunnel 5 Days Post-Op   Bypass is patent.  Ambulated in halls yesterday.  New cardiomyopathy to be followed as outpatient by cardiology. Counseled on smoking cessation.  Continue santyl to right foot wound daily.  Will discharge home on ASA and statin.  Discharge home today.   Alvia Grove 06/07/2015 7:28 AM --  Laboratory CBC    Component Value Date/Time   WBC 10.0 06/03/2015 0430   HGB 11.8* 06/03/2015 0430   HCT 35.0* 06/03/2015 0430   PLT 198 06/03/2015 0430    BMET    Component Value Date/Time   NA 142 06/03/2015 0430   K 4.1 06/03/2015 0430   CL 107 06/03/2015 0430   CO2 25 06/03/2015 0430   GLUCOSE 140* 06/03/2015 0430   BUN <5* 06/03/2015 0430   CREATININE 0.74 06/03/2015 0430   CALCIUM 8.8* 06/03/2015 0430   GFRNONAA >60 06/03/2015 0430   GFRAA >60 06/03/2015 0430    COAG Lab Results  Component Value Date   INR 1.11 06/02/2015   INR 1.02 06/01/2015   No results found for: PTT  Antibiotics Anti-infectives    Start     Dose/Rate Route Frequency Ordered Stop   06/02/15 1930  cefUROXime (ZINACEF) 1.5 g in dextrose 5 % 50 mL IVPB     1.5 g 100 mL/hr over 30 Minutes Intravenous Every 12 hours 06/02/15 1541 06/03/15 0709   06/02/15  0600  cefUROXime (ZINACEF) 1.5 g in dextrose 5 % 50 mL IVPB  Status:  Discontinued     1.5 g 100 mL/hr over 30 Minutes Intravenous On call to O.R. 06/01/15 1740 06/01/15 1758   06/02/15 0600  cefUROXime (ZINACEF) 1.5 g in dextrose 5 % 50 mL IVPB     1.5 g 100 mL/hr over 30 Minutes Intravenous To Short Stay 06/01/15 1758 06/02/15 [place], PA-C [facility] Office: [phone] Pager: [phone] 06/07/2015 7:28 AM

## 2015-06-07 NOTE — Progress Notes (Signed)
Inpatient Diabetes Program Recommendations  AACE/ADA: New Consensus Statement on Inpatient Glycemic Control (2015)  Target Ranges:  Prepandial:   less than 140 mg/dL      Peak postprandial:   less than 180 mg/dL (1-2 hours)      Critically ill patients:  140 - 180 mg/dL   Results for TOVI, WAGAR (MRN [id number]) as of 06/07/2015 08:20  Ref. Range 06/06/2015 06:02 06/06/2015 11:44 06/06/2015 16:40 06/06/2015 21:10 06/07/2015 06:10  Glucose-Capillary Latest Ref Range: 65-99 mg/dL 171 (H) 219 (H) 112 (H) 216 (H) 220 (H)   Review of Glycemic Control  Diabetes history: DM 2 Outpatient Diabetes medications: Glipizide 10 mg BID, Metformin 1,000 mg BID Current orders for Inpatient glycemic control: Glipizide 10 mg BID, Novolog Moderate TID  Inpatient Diabetes Program Recommendations:  Correction (SSI): Glucose increasing into the 200's during the day. Glucose over 200 at bedtime, please consider adding HS scale.  Thanks,  [name] RN, MSN, [facility] Inpatient Diabetes Coordinator Team Pager [phone] (8a-5p)

## 2015-06-07 NOTE — Progress Notes (Signed)
Physical Therapy Treatment Patient Details Name: Donald Steele MRN: [id number] DOB: 05/30/1960 Today's Date: 06/07/2015    History of Present Illness Pt is a 55 y.o. male with PMH consisting of DM and PVD. He was admitted 06-01-15 with R ischemic foot ulcer and underwent R fib-tib bypass graft 06-02-15.    PT Comments    Pt performed gait training 218 ft with RW and min guard assist secondary to safety issues with RW.  Pt performed x 10 stairs with min guard, B rails and 3 stairs with RW and no rail in which pt required min assist.  Pt able to follow commands for sequencing during stair negotiation and demonstrated improved carryover with hand placement during transfer technique.  Pt set for d/c home today.      Home health PT;Supervision for mobility/OOB     Equipment Recommendations  Rolling walker with 5" wheels    Recommendations for Other Services       Precautions / Restrictions Precautions Precautions: Fall Restrictions Weight Bearing Restrictions: No    Mobility  Bed Mobility Overal bed mobility: Modified Independent Bed Mobility: Supine to Sit;Sit to Supine     Supine to sit: Modified independent (Device/Increase time) Sit to supine: Modified independent (Device/Increase time)   General bed mobility comments: Demonstrates correct technqiue with good safety and use of rails.  No cueing needed.    Transfers Overall transfer level: Needs assistance Equipment used: Rolling walker (2 wheeled) Transfers: Sit to/from Stand Sit to Stand: Supervision Stand pivot transfers: Supervision       General transfer comment: Pt remains to required VCs for hand placement to avoid pulling.  pt demonstrated improve carryover to follow commands to push from seated surface to improve safety.    Ambulation/Gait Ambulation/Gait assistance: Min guard Ambulation Distance (Feet): 218 Feet Assistive device: Rolling walker (2 wheeled) Gait Pattern/deviations: Step-to  pattern;Antalgic;Decreased stride length;Wide base of support;Decreased stance time - right Gait velocity: decreased   General Gait Details: Pt demonstrates poor safety intermittently with RW picking RW up to advance forward and setting back to legs of walker down first and stepping before front wheels are grounded.  Pt required cues to push RW and maintain close proximity to improve safety and maintain balance.  pt intermittently removing LUE report pain in left tricep which causes weaving with RW.  Pt remains slow to initiate weight bearing to RLE.     Stairs Stairs: Yes Stairs assistance: Min assist Stair Management: Two rails;No rails Number of Stairs: 13 (Pt performed 10 stairs with min guard and B rails.  Pt performed 3 stairs with RW for support and no rails requiring min assist for support.  ) General stair comments: Pt performed 10 stairs with B rails, min guard assist, required cues for hand placement, sequencing, and safety with foot placement.  Pt performed x 3 stairs with RW backwards to ascend and forwards to descend.  Pt required min assist for maintaining placement of RW for support during negotiation.    Wheelchair Mobility    Modified Rankin (Stroke Patients Only)       Balance Overall balance assessment: Needs assistance   Sitting balance-Leahy Scale: Good       Standing balance-Leahy Scale: Fair                      Cognition Arousal/Alertness: Awake/alert Behavior During Therapy: WFL for tasks assessed/performed Overall Cognitive Status: Within Functional Limits for tasks assessed  Exercises      General Comments        Pertinent Vitals/Pain Pain Score: 5  Pain Location: LUE ( tricep) denies pain in RLE.      Home Living                      Prior Function            PT Goals (current goals can now be found in the care plan section) Acute Rehab PT Goals Patient Stated Goal: home Potential to  Achieve Goals: Good Progress towards PT goals: Progressing toward goals    Frequency  Min 3X/week    PT Plan Current plan remains appropriate    Co-evaluation             End of Session Equipment Utilized During Treatment: Gait belt Activity Tolerance: Patient tolerated treatment well;Patient limited by pain Patient left: in bed;with call bell/phone within reach;with family/visitor present     Time: [id number] PT Time Calculation (min) (ACUTE ONLY): 19 min  Charges:  $Gait Training: 8-22 mins                    G Codes:      Cristela Blue Jun 29, 2015, 8:46 AM  Governor Rooks, PTA pager [phone]

## 2015-06-07 NOTE — Progress Notes (Signed)
06/07/2015 11:22 AM Discharge AVS meds taken today and those due this evening reviewed.  Follow-up appointments and when to call md reviewed.  D/C IV and TELE.  Questions and concerns addressed.   D/C home per orders. [place]

## 2015-06-08 ENCOUNTER — Encounter: Payer: Self-pay | Admitting: Medical

## 2015-06-08 ENCOUNTER — Telehealth: Payer: Self-pay | Admitting: Medical

## 2015-06-08 ENCOUNTER — Ambulatory Visit (INDEPENDENT_AMBULATORY_CARE_PROVIDER_SITE_OTHER): Payer: BLUE CROSS/BLUE SHIELD | Admitting: Medical

## 2015-06-08 ENCOUNTER — Telehealth: Payer: Self-pay | Admitting: Vascular Surgery

## 2015-06-08 VITALS — BP 120/70 | HR 83 | Wt 182.0 lb

## 2015-06-08 DIAGNOSIS — R0989 Other specified symptoms and signs involving the circulatory and respiratory systems: Secondary | ICD-10-CM | POA: Diagnosis not present

## 2015-06-08 DIAGNOSIS — R238 Other skin changes: Secondary | ICD-10-CM

## 2015-06-08 DIAGNOSIS — L909 Atrophic disorder of skin, unspecified: Secondary | ICD-10-CM | POA: Diagnosis not present

## 2015-06-08 DIAGNOSIS — T148 Other injury of unspecified body region: Secondary | ICD-10-CM

## 2015-06-08 DIAGNOSIS — M79671 Pain in right foot: Secondary | ICD-10-CM | POA: Diagnosis not present

## 2015-06-08 DIAGNOSIS — I70209 Unspecified atherosclerosis of native arteries of extremities, unspecified extremity: Secondary | ICD-10-CM | POA: Diagnosis not present

## 2015-06-08 DIAGNOSIS — T148XXA Other injury of unspecified body region, initial encounter: Secondary | ICD-10-CM

## 2015-06-08 DIAGNOSIS — E1151 Type 2 diabetes mellitus with diabetic peripheral angiopathy without gangrene: Secondary | ICD-10-CM | POA: Diagnosis not present

## 2015-06-08 DIAGNOSIS — I429 Cardiomyopathy, unspecified: Secondary | ICD-10-CM | POA: Diagnosis not present

## 2015-06-08 DIAGNOSIS — Z720 Tobacco use: Secondary | ICD-10-CM | POA: Diagnosis not present

## 2015-06-08 DIAGNOSIS — I420 Dilated cardiomyopathy: Secondary | ICD-10-CM | POA: Diagnosis not present

## 2015-06-08 DIAGNOSIS — F17200 Nicotine dependence, unspecified, uncomplicated: Secondary | ICD-10-CM

## 2015-06-08 MED ORDER — CARVEDILOL 3.125 MG PO TABS: 3.1250 mg | ORAL_TABLET | Freq: Two times a day (BID) | ORAL | Status: DC

## 2015-06-08 MED ORDER — LOSARTAN POTASSIUM 50 MG PO TABS: 50.0000 mg | ORAL_TABLET | Freq: Every day | ORAL | Status: DC

## 2015-06-08 NOTE — Telephone Encounter (Signed)
Spoke with pts wife to schedule, dpm °

## 2015-06-08 NOTE — Telephone Encounter (Signed)
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 Kellie Simmering in 2 weeks  Thanks Maudie Mercury

## 2015-06-08 NOTE — Patient Instructions (Signed)
Continue your routine medications which include:  Glipizide 10mg  twice daily for diabetes (increased at hospital visit)  Metformin 1000mg  twice daily (increased at hospital visit)  Atorvastatin 10mg  daily at bedtime for cholesterol  Aspirin 81mg  daily at bedtime for heart health  Lasix 20mg  daily for heart and swelling, taken in the morning  As needed medications:  Ultram for mild to moderate pain as labeled  Percocet for moderate to sever pain as labeled  don't take percocet and ultram simultaneously  Wound care:  continue to use the Santyl cream on the right foot wounds  Use mild soap and water for cleaning  For now, don't use the following: cipro antibiotic Doxycycline antibiotic Clobetasol strong steroid cream Mupirocin antibiotic ointment  Hematoma: Use ice with cloth in between skin and ice 20 minutes three times daily Use compression such as bath cloth folded inside boxer briefs over the hematoma  Other recommendations: Follow up with cardiology for stress test soon  Followup up with Dr. Kellie Simmering, vascular surgery in 2 weeks as scheduled  Check your sugars daily in the morning and write these down Continue your current medications Lets see you back in 1 month regarding diabetes/sugar Lets see you back tomorrow regarding the hematoma of the right groin    Hypoglycemia Hypoglycemia occurs when the glucose in your blood is too low. Glucose is a type of sugar that is your body's main energy source. Hormones, such as insulin and glucagon, control the level of glucose in the blood. Insulin lowers blood glucose and glucagon increases blood glucose. Having too much insulin in your blood stream, or not eating enough food containing sugar, can result in hypoglycemia. Hypoglycemia can happen to people with or without diabetes. It can develop quickly and can be a medical emergency.  CAUSES   Missing or delaying meals.  Not eating enough carbohydrates at meals.  Taking  too much diabetes medicine.  Not timing your oral diabetes medicine or insulin doses with meals, snacks, and exercise.  Nausea and vomiting.  Certain medicines.  Severe illnesses, such as hepatitis, kidney disorders, and certain eating disorders.  Increased activity or exercise without eating something extra or adjusting medicines.  Drinking too much alcohol.  A nerve disorder that affects body functions like your heart rate, blood pressure, and digestion (autonomic neuropathy).  A condition where the stomach muscles do not function properly (gastroparesis). Therefore, medicines and food may not absorb properly.  Rarely, a tumor of the pancreas can produce too much insulin. SYMPTOMS   Hunger.  Sweating (diaphoresis).  Change in body temperature.  Shakiness.  Headache.  Anxiety.  Lightheadedness.  Irritability.  Difficulty concentrating.  Dry mouth.  Tingling or numbness in the hands or feet.  Restless sleep or sleep disturbances.  Altered speech and coordination.  Change in mental status.  Seizures or prolonged convulsions.  Combativeness.  Drowsiness (lethargic).  Weakness.  Increased heart rate or palpitations.  Confusion.  Pale, gray skin color.  Blurred or double vision.  Fainting. DIAGNOSIS  A physical exam and medical history will be performed. Your caregiver may make a diagnosis based on your symptoms. Blood tests and other lab tests may be performed to confirm a diagnosis. Once the diagnosis is made, your caregiver will see if your signs and symptoms go away once your blood glucose is raised.  TREATMENT  Usually, you can easily treat your hypoglycemia when you notice symptoms.  Check your blood glucose. If it is less than 70 mg/dl, take one of the following:  3-4 glucose tablets.    cup juice.    cup regular soda.   1 cup skim milk.   -1 tube of glucose gel.   5-6 hard candies.   Avoid high-fat drinks or food that  may delay a rise in blood glucose levels.  Do not take more than the recommended amount of sugary foods, drinks, gel, or tablets. Doing so will cause your blood glucose to go too high.   Wait 10-15 minutes and recheck your blood glucose. If it is still less than 70 mg/dl or below your target range, repeat treatment.   Eat a snack if it is more than 1 hour until your next meal.  There may be a time when your blood glucose may go so low that you are unable to treat yourself at home when you start to notice symptoms. You may need someone to help you. You may even faint or be unable to swallow. If you cannot treat yourself, someone will need to bring you to the hospital.  [place]  If you have diabetes, follow your diabetes management plan by:  Taking your medicines as directed.  Following your exercise plan.  Following your meal plan. Do not skip meals. Eat on time.  Testing your blood glucose regularly. Check your blood glucose before and after exercise. If you exercise longer or different than usual, be sure to check blood glucose more frequently.  Wearing your medical alert jewelry that says you have diabetes.  Identify the cause of your hypoglycemia. Then, develop ways to prevent the recurrence of hypoglycemia.  Do not take a hot bath or shower right after an insulin shot.  Always carry treatment with you. Glucose tablets are the easiest to carry.  If you are going to drink alcohol, drink it only with meals.  Tell friends or family members ways to keep you safe during a seizure. This may include removing hard or sharp objects from the area or turning you on your side.  Maintain a healthy weight. SEEK MEDICAL CARE IF:   You are having problems keeping your blood glucose in your target range.  You are having frequent episodes of hypoglycemia.  You feel you might be having side effects from your medicines.  You are not sure why your blood glucose is dropping so  low.  You notice a change in vision or a new problem with your vision. SEEK IMMEDIATE MEDICAL CARE IF:   Confusion develops.  A change in mental status occurs.  The inability to swallow develops.  Fainting occurs.   This information is not intended to replace advice given to you by your health care provider. Make sure you discuss any questions you have with your health care provider.   Document Released: 04/02/2005 Document Revised: 04/07/2013 Document Reviewed: 12/07/2014 Elsevier Interactive Patient Education 2016 [employer]    Peripheral Vascular Disease Peripheral vascular disease (PVD) is a disease of the blood vessels that are not part of your heart and brain. A simple term for PVD is poor circulation. In most cases, PVD narrows the blood vessels that carry blood from your heart to the rest of your body. This can result in a decreased supply of blood to your arms, legs, and internal organs, like your stomach or kidneys. However, it most often affects a person's lower legs and feet. There are two types of PVD.  Organic PVD. This is the more common type. It is caused by damage to the structure of blood vessels.  Functional PVD. This is caused by conditions that make blood vessels contract and tighten (spasm). Without treatment, PVD tends to get worse over time. PVD can also lead to acute ischemic limb. This is when an arm or limb suddenly has trouble getting enough blood. This is a medical emergency. CAUSES Each type of PVD has many different causes. The most common cause of PVD is buildup of a fatty material (plaque) inside of your arteries (atherosclerosis). Small amounts of plaque can break off from the walls of the blood vessels and become lodged in a smaller artery. This blocks blood flow and can cause acute ischemic limb. Other common causes of PVD include:  Blood clots that form inside of blood vessels.  Injuries to blood vessels.  Diseases that cause inflammation  of blood vessels or cause blood vessel spasms.  Health behaviors and health history that increase your risk of developing PVD. RISK FACTORS  You may have a greater risk of PVD if you:  Have a family history of PVD.  Have certain medical conditions, including:  High cholesterol.  Diabetes.  High blood pressure (hypertension).  Coronary heart disease.  Past problems with blood clots.  Past injury, such as burns or a broken bone. These may have damaged blood vessels in your limbs.  Buerger disease. This is caused by inflamed blood vessels in your hands and feet.  Some forms of arthritis.  Rare birth defects that affect the arteries in your legs.  Use tobacco.  Do not get enough exercise.  Are obese.  Are age 12 or older. SIGNS AND SYMPTOMS  PVD may cause many different symptoms. Your symptoms depend on what part of your body is not getting enough blood. Some common signs and symptoms include:  Cramps in your lower legs. This may be a symptom of poor leg circulation (claudication).  Pain and weakness in your legs while you are physically active that goes away when you rest (intermittent claudication).  Leg pain when at rest.  Leg numbness, tingling, or weakness.  Coldness in a leg or foot, especially when compared with the other leg.  Skin or hair changes. These can include:  Hair loss.  Shiny skin.  Pale or bluish skin.  Thick toenails.  Inability to get or maintain an erection (erectile dysfunction). People with PVD are more prone to developing ulcers and sores on their toes, feet, or legs. These may take longer than normal to heal. DIAGNOSIS Your health care provider may diagnose PVD from your signs and symptoms. The health care provider will also do a physical exam. You may have tests to find out what is causing your PVD and determine its severity. Tests may include:  Blood pressure recordings from your arms and legs and measurements of the strength of  your pulses (pulse volume recordings).  Imaging studies using sound waves to take pictures of the blood flow through your blood vessels (Doppler ultrasound).  Injecting a dye into your blood vessels before having imaging studies using:  X-rays (angiogram or arteriogram).  Computer-generated X-rays (CT angiogram).  A powerful electromagnetic field and a computer (magnetic resonance angiogram or MRA). TREATMENT Treatment for PVD depends on the cause of your condition and the severity of your symptoms. It also depends on your age. Underlying causes need to be treated and controlled. These include long-lasting (chronic) conditions, such as diabetes, high cholesterol, and high blood pressure. You may need to first try making lifestyle changes and taking medicines. Surgery may be needed if these do  not work. Lifestyle changes may include:  Quitting smoking.  Exercising regularly.  Following a low-fat, low-cholesterol diet. Medicines may include:  Blood thinners to prevent blood clots.  Medicines to improve blood flow.  Medicines to improve your blood cholesterol levels. Surgical procedures may include:  A procedure that uses an inflated balloon to open a blocked artery and improve blood flow (angioplasty).  A procedure to put in a tube (stent) to keep a blocked artery open (stent implant).  Surgery to reroute blood flow around a blocked artery (peripheral bypass surgery).  Surgery to remove dead tissue from an infected wound on the affected limb.  Amputation. This is surgical removal of the affected limb. This may be necessary in cases of acute ischemic limb that are not improved through medical or surgical treatments. HOME CARE INSTRUCTIONS  Take medicines only as directed by your health care provider.  Do not use any tobacco products, including cigarettes, chewing tobacco, or electronic cigarettes. If you need help quitting, ask your health care provider.  Lose weight if you  are overweight, and maintain a healthy weight as directed by your health care provider.  Eat a diet that is low in fat and cholesterol. If you need help, ask your health care provider.  Exercise regularly. Ask your health care provider to suggest some good activities for you.  Use compression stockings or other mechanical devices as directed by your health care provider.  Take good care of your feet.  Wear comfortable shoes that fit well.  Check your feet often for any cuts or sores. SEEK MEDICAL CARE IF:  You have cramps in your legs while walking.  You have leg pain when you are at rest.  You have coldness in a leg or foot.  Your skin changes.  You have erectile dysfunction.  You have cuts or sores on your feet that are not healing. SEEK IMMEDIATE MEDICAL CARE IF:  Your arm or leg turns cold and blue.  Your arms or legs become red, warm, swollen, painful, or numb.  You have chest pain or trouble breathing.  You suddenly have weakness in your face, arm, or leg.  You become very confused or lose the ability to speak.  You suddenly have a very bad headache or lose your vision.   This information is not intended to replace advice given to you by your health care provider. Make sure you discuss any questions you have with your health care provider.   Document Released: 05/10/2004 Document Revised: 04/23/2014 Document Reviewed: 09/10/2013 Elsevier Interactive Patient Education [employer].

## 2015-06-08 NOTE — Discharge Summary (Signed)
[facility] Discharge Summary  Donald Steele 1960/10/13 55 y.o. 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 male who was referred for urgent evaluation today for ischemic ulcer and rest pain right lower extremity. He was referred by Chana Bode PA. Patient has history of bilateral calf claudication limiting him to less than 1 block. He developed an ulceration of his right foot a month or so ago and that has gradually worsened. He does have a long history of tobacco abuse greater than 1 pack per day for 40 years. He denies any history of coronary artery disease or cardiac symptoms. He does have type 2 diabetes mellitus. He has no history of left leg rest pain.    Hospital Course:  The patient was admitted to the hospital and taken to the [facility] lab on 06/01/2015 for an aortogram with right lower extremity runoff. This showed high-grade right profunda femoral stenosis, occluded right superficial femoral artery with reconstitution of the below-knee popliteal artery and severe tibial disease with the posterior tibial being the dominant runoff.     Anesthesia requested cardiac clearance prior to surgery. He was cleared for surgery by cardiology. He had a carotid bruit on physical exam and a carotid duplex revealed bilateral 1-39% ICA stenosis.   The patient was taken to the operating room on 06/02/2015 and underwent right profunda femoris to posterior tibial artery bypass using right non-reversed greater saphenous vein and endarterectomy of right profunda femoris artery. The patient tolerated the procedure well and was taken to the recovery room in stable condition.   POD 1: The patient was doing well. He had good doppler flow to the right PT and DP arteries. His right foot wound was stable. He was  transferred to the floor. He had an ECHO performed.   POD 2: His ECHO reviewed new cardiomyopathy. He had no current ischemia symptoms and no evidence of volume overload. He was on cozaar and started on a low dose beta blocker. Cardiology stated that they will follow-up with probable outpatient Lexiscan Myoview.   POD 3: The patient was doing well. He was encouraged to continue ambulation. His incisions were healing nickely and he had brisk right PT doppler flow.   POD 4: He remained stable and continued to increase his ambulation.  POD 5: The patient was doing well. He ambulated in the halls well. He had a small hematoma of his right groin incision. He was discharged home on POD 5 in good condition. He was set up for outpatient Lexiscan Myoview.    CBC    Component Value Date/Time   WBC 10.0 06/03/2015 0430   RBC 3.99* 06/03/2015 0430   HGB 11.8* 06/03/2015 0430   HCT 35.0* 06/03/2015 0430   PLT 198 06/03/2015 0430   MCV 87.7 06/03/2015 0430   MCH 29.6 06/03/2015 0430   MCHC 33.7 06/03/2015 0430   RDW 12.4 06/03/2015 0430    BMET    Component Value Date/Time   NA 142 06/03/2015 0430   K 4.1 06/03/2015 0430   CL 107 06/03/2015 0430   CO2 25 06/03/2015 0430   GLUCOSE 140* 06/03/2015 0430   BUN <5* 06/03/2015 0430   CREATININE 0.74 06/03/2015 0430   CALCIUM 8.8* 06/03/2015 0430   GFRNONAA >60 06/03/2015 0430   GFRAA >60 06/03/2015 0430  Discharge Instructions:   The patient is discharged to home with extensive instructions on wound care and progressive ambulation.  They are instructed not to drive or perform any heavy lifting until returning to see the physician in his office.  Discharge Instructions    Call MD for:  redness, tenderness, or signs of infection (pain, swelling, bleeding, redness, odor or green/yellow discharge around incision site)    Complete by:  As directed      Call MD for:  severe or increased pain, loss or decreased feeling  in affected limb(s)     Complete by:  As directed      Call MD for:  temperature >100.5    Complete by:  As directed      Discharge wound care:    Complete by:  As directed   Wash the groin wound with soap and water daily and pat dry. (No tub bath-only shower)  Then put a dry gauze or washcloth there to keep this area dry daily and as needed.  Do not use Vaseline or neosporin on your incisions.  Only use soap and water on your incisions and then protect and keep dry.  Wash remaining incisions daily with soap and water and pat dry.   You may soak your right foot in lukewarm water with dial soap daily. Apply Santyl ointment to your wound and place moist saline gauze on top and wrap foot daily.     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 Congestive dilated cardiomyopathy ([place])   . 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 Preop cardiovascular exam 06/01/2015  . Carotid bruit 06/01/2015  . Nonhealing ulcer of right lower extremity ([place])   . DM (diabetes mellitus), type 2 with peripheral vascular complications ([place]) [id number]  . Femoral bruit 05/31/2015  . Smoker 05/31/2015  . Skin breakdown 05/31/2015  . Leg ulcer, left ([place]) 05/31/2015  . Type 2 diabetes mellitus with diabetic neuropathy, without long-term current use of insulin ([place]) 05/31/2015  . Right foot pain 05/31/2015  . PAD (peripheral artery disease) ([place]) 05/31/2015   Past Medical History  Diagnosis Date  . Diabetes  mellitus   . Hyperlipidemia   . Femoral bruit   . Peripheral vascular disease due to secondary diabetes mellitus ([place])        Medication List    STOP taking these medications        doxycycline 100 MG capsule  Commonly known as:  VIBRAMYCIN      TAKE these medications        aspirin EC 81 MG tablet  Take 1 tablet (81 mg total) by mouth daily.     atorvastatin 10 MG tablet  Commonly known as:  LIPITOR  Take 1 tablet (10 mg total) by mouth daily.     clobetasol ointment  0.05 %  Commonly known as:  TEMOVATE  Apply 1 application topically 2 (two) times daily.     collagenase ointment  Commonly known as:  SANTYL  Apply topically daily. Apply to right foot wound daily. Place moist saline gauze on top and wrap foot daily.     furosemide 20 MG tablet  Commonly known as:  LASIX  Take 20 mg by mouth daily.     glipiZIDE 10 MG tablet  Commonly known as:  GLUCOTROL  Take 10 mg by mouth 2 (two) times daily.     LYRICA 100 MG capsule  Generic drug:  pregabalin  Take 100 mg by mouth 2 (two) times daily.     metFORMIN 1000 MG tablet  Commonly known as:  GLUCOPHAGE  Take 1,000 mg by mouth 2 (two) times daily with a meal.     oxyCODONE 5 MG immediate release tablet  Commonly known as:  Oxy IR/ROXICODONE  Take 1-2 tablets (5-10 mg total) by mouth every 4 (four) hours as needed for moderate pain.     traMADol 50 MG tablet  Commonly known as:  ULTRAM  Take 1 tablet (50 mg total) by mouth every 6 (six) hours as needed.        Oxycodone #30 No Refill  Disposition: Home  Patient's condition: is Good  Follow up: 1. Dr. Kellie Simmering in 2 weeks   Virgina Jock, PA-C [facility] [phone] 06/08/2015  10:34 AM  - For VQI Registry use --- Instructions: Press F2 to tab through selections.  Delete question if not applicable.   Post-op:  Wound infection: No  Graft infection: No  Transfusion: No  New Arrhythmia: No Ipsilateral amputation: No, [ ]   Minor, [ ]  BKA, [ ]  AKA Discharge patency: [x ] Primary, [ ]  Primary assisted, [ ]  Secondary, [ ]  Occluded Patency judged by: [x ] Dopper only, [ ]  Palpable graft pulse, [ ]  Palpable distal pulse, [ ]  ABI inc. > 0.15, [ ]  Duplex Discharge ABI: R 0.74, L 0.49 D/C Ambulatory Status: Ambulatory with Assistance  Complications: MI: No, [ ]  Troponin only, [ ]  EKG or Clinical CHF: No Resp failure:No, [ ]  Pneumonia, [ ]  Ventilator Chg in renal function: No, [ ]  Inc. Cr > 0.5, [ ]  Temp. Dialysis, [ ]  Permanent dialysis Stroke: No, [ ]  Minor, [ ]  Major Return to OR: No  Reason for return to OR: [ ]  Bleeding, [ ]  Infection, [ ]  Thrombosis, [ ]  Revision  Discharge medications: Statin use:  yes ASA use:  yes Plavix use:  no Beta blocker use: yes Coumadin use: no

## 2015-06-08 NOTE — Telephone Encounter (Signed)
Called pt concerning his recent hospital stay. Pt states he is doing ok. Attempted to go over medication and he seemed confused about what he was taking. He states this is all new to him and he has never been on this many meds before. Pt stated the ointment he was given was too expensive so he was just going to use something he already had. Pt spelled the medication and it is not what hospital prescribed. Pt was informed not to use that until he knows by a provider it is ok. Due to his issues with his medication pt was made an appt for today.

## 2015-06-09 ENCOUNTER — Ambulatory Visit: Payer: BLUE CROSS/BLUE SHIELD | Admitting: Medical

## 2015-06-09 NOTE — Telephone Encounter (Signed)
Called pt since he missed his appt today. Thought it was at 2 pm. But did not feel like walking today he stated. Said the swelling has went down and that he is feeling "pretty good" today

## 2015-06-09 NOTE — Telephone Encounter (Signed)
Ok, f/u with cardiology as planned

## 2015-06-10 NOTE — Progress Notes (Signed)
Subjective: Chief Complaint  Patient presents with  . Follow-up    confused about hospital medications   Here for hospital f/u.  I saw him as a new patient on 05/31/15 at which time he appeared to have worrisome PVD findings in right leg with vascular compromise, nonhealing foot wound.   We were able to get him in with vascular surgery and he was admitted 06/01/15-06/07/15.   He was found to have high grade right profunda femoral stenosis, occluded right superficial femoral artery, reconstitution of bellow knee popliteal artery, and severe tibial disease.  He had bypass of right profunda femoris to right posterior tibial artery using right non reversed greater saphenous vein and endarterectomy of right profunda femoris artery on 06/02/15.   He also had carotid dopplers due to bruit, echo showing new cardiomyopathy.   He was started on Cozaar and beta blocker.  He had a hematoma of right inguinal area at time of discharge.     He is here in hospital f/u.  He is a little confused about his medications and the last few days have been overwhelming.  He is appreciative of [place] acting so quickly to get him seen by vascular.   Past Medical History  Diagnosis Date  . Diabetes mellitus   . Hyperlipidemia   . Femoral bruit   . Peripheral vascular disease due to secondary diabetes mellitus [facility])    Current Outpatient Prescriptions on File Prior to Visit  Medication Sig Dispense Refill  . aspirin EC 81 MG tablet Take 1 tablet (81 mg total) by mouth daily. 30 tablet 0  . atorvastatin (LIPITOR) 10 MG tablet Take 1 tablet (10 mg total) by mouth daily. 30 tablet 11  . clobetasol ointment (TEMOVATE) [id number] % Apply 1 application topically 2 (two) times daily.    . collagenase (SANTYL) ointment Apply topically daily. Apply to right foot wound daily. Place moist saline gauze on top and wrap foot daily. 15 g 0  . furosemide (LASIX) 20 MG tablet Take 20 mg by mouth daily.     Marland Kitchen glipiZIDE (GLUCOTROL) 10 MG tablet Take 10  mg by mouth 2 (two) times daily.  0  . LYRICA 100 MG capsule Take 100 mg by mouth 2 (two) times daily.   2  . metFORMIN (GLUCOPHAGE) 1000 MG tablet Take 1,000 mg by mouth 2 (two) times daily with a meal.    . oxyCODONE (OXY IR/ROXICODONE) 5 MG immediate release tablet Take 1-2 tablets (5-10 mg total) by mouth every 4 (four) hours as needed for moderate pain. 30 tablet 0  . traMADol (ULTRAM) 50 MG tablet Take 1 tablet (50 mg total) by mouth every 6 (six) hours as needed. 15 tablet 0  . carvedilol (COREG) 3.125 MG tablet Take 1 tablet (3.125 mg total) by mouth 2 (two) times daily with a meal. (Patient not taking: Reported on 06/08/2015) 60 tablet 1  . losartan (COZAAR) 50 MG tablet Take 1 tablet (50 mg total) by mouth daily. (Patient not taking: Reported on 06/08/2015) 30 tablet 1   No current facility-administered medications on file prior to visit.    ROS as in subjective  Objective: BP 120/70 mmHg  Pulse 83  Wt 182 lb (82.555 kg)  Gen: wdwn, nad, pleasant AA male Right leg warm today.   There is less swelling and better color in general of right foot compared to 05/31/15, wounds seem to be healing, there is still brown ulcerated healing tissue of right anterior ankle and dorsal foot.  There is a long medial right leg surgical scar from recent bypass.  The surgical wound is healing appropriately, no pus, no erythema or warmth.   There is a 5cm x 9cm hematoma of right inguinal region today over recent surgical incision, slight tenderness, no warmth, no induration. Heart RRR normal S1, S2, no murmurs lungs clear Palpable right 1+ pulses today    Assessment: Encounter Diagnoses  Name Primary?  . Hematoma Yes  . Cardiomyopathy ([place])   . Atherosclerotic peripheral vascular disease ([place])   . Smoker   . DM (diabetes mellitus), type 2 with peripheral vascular complications ([place])   . Skin breakdown   . Congestive dilated cardiomyopathy ([place])   . Carotid bruit, unspecified laterality   .  Right foot pain     Plan reviewed his hospital records, imaging, labs, consults.   I gave handout with info below to help clarify his medication changes.   Given the increased size of hematoma, Dr.Lalonde and I both examined and advised he use cool compresses and pressure 20 minutes at least TID to the right groin hematoma and to f/u here tomorrow to make sure it is improving.   otherwise he will f/lu with cardiology as scheduled for stress test and f/u with vascular surgery in 2 wk as scheduled.   He will c/t checking sugars, will c/t to abstain from tobacco, and I will see him back in a month otherwise.     Continue your routine medications which include:  Glipizide 10mg  twice daily for diabetes (increased at hospital visit)  Metformin 1000mg  twice daily (increased at hospital visit)  Atorvastatin 10mg  daily at bedtime for cholesterol  Aspirin 81mg  daily at bedtime for heart health  Lasix 20mg  daily for heart and swelling, taken in the morning  As needed medications:  Ultram for mild to moderate pain as labeled  Percocet for moderate to sever pain as labeled  don't take percocet and ultram simultaneously  Wound care:  continue to use the Santyl cream on the right foot wounds  Use mild soap and water for cleaning  For now, don't use the following: cipro antibiotic Doxycycline antibiotic Clobetasol strong steroid cream Mupirocin antibiotic ointment  Hematoma: Use ice with cloth in between skin and ice 20 minutes three times daily Use compression such as bath cloth folded inside boxer briefs over the hematoma  Other recommendations: Follow up with cardiology for stress test soon  Followup up with Dr. Kellie Simmering, vascular surgery in 2 weeks as scheduled  Check your sugars daily in the morning and write these down Continue your current medications Lets see you back in 1 month regarding diabetes/sugar Lets see you back tomorrow regarding the hematoma of the right groin

## 2015-06-13 ENCOUNTER — Telehealth (HOSPITAL_COMMUNITY): Payer: Self-pay | Admitting: *Deleted

## 2015-06-13 ENCOUNTER — Telehealth (HOSPITAL_COMMUNITY): Payer: Self-pay | Admitting: Radiology

## 2015-06-13 NOTE — Telephone Encounter (Signed)
Left message on voicemail in reference to upcoming appointment scheduled for 06/15/15. Phone number given for a call back so details instructions can be given. Shandee Jergens J Cailyn Houdek, RN 

## 2015-06-13 NOTE — Telephone Encounter (Signed)
Patient given detailed instructions per Myocardial Perfusion Study Information Sheet for the test on 06/15/2015 at 7:30. Patient notified to arrive 15 minutes early and that it is imperative to arrive on time for appointment to keep from having the test rescheduled.  If you need to cancel or reschedule your appointment, please call the office within 24 hours of your appointment. Failure to do so may result in a cancellation of your appointment, and a $50 no show fee. Patient verbalized understanding.EHK

## 2015-06-15 ENCOUNTER — Ambulatory Visit (HOSPITAL_COMMUNITY): Payer: BLUE CROSS/BLUE SHIELD | Attending: Cardiology

## 2015-06-15 DIAGNOSIS — I779 Disorder of arteries and arterioles, unspecified: Secondary | ICD-10-CM | POA: Insufficient documentation

## 2015-06-15 DIAGNOSIS — R9439 Abnormal result of other cardiovascular function study: Secondary | ICD-10-CM | POA: Diagnosis not present

## 2015-06-15 DIAGNOSIS — I517 Cardiomegaly: Secondary | ICD-10-CM | POA: Diagnosis not present

## 2015-06-15 DIAGNOSIS — E119 Type 2 diabetes mellitus without complications: Secondary | ICD-10-CM | POA: Insufficient documentation

## 2015-06-15 DIAGNOSIS — I422 Other hypertrophic cardiomyopathy: Secondary | ICD-10-CM | POA: Insufficient documentation

## 2015-06-15 LAB — MYOCARDIAL PERFUSION IMAGING
CHL CUP NUCLEAR SDS: 6
CHL CUP RESTING HR STRESS: 77 {beats}/min
CSEPPHR: 87 {beats}/min
LV sys vol: 181 mL
LVDIAVOL: 256 mL
RATE: 0.23
SRS: 13
SSS: 16
TID: 1.1

## 2015-06-15 MED ORDER — REGADENOSON 0.4 MG/5ML IV SOLN
0.4000 mg | Freq: Once | INTRAVENOUS | Status: AC
  Administered 2015-06-15: 0.4 mg via INTRAVENOUS

## 2015-06-15 MED ORDER — TECHNETIUM TC 99M SESTAMIBI GENERIC - CARDIOLITE
32.9000 | Freq: Once | INTRAVENOUS | Status: AC | PRN
  Administered 2015-06-15: 32.9 via INTRAVENOUS

## 2015-06-15 MED ORDER — TECHNETIUM TC 99M SESTAMIBI GENERIC - CARDIOLITE
10.6000 | Freq: Once | INTRAVENOUS | Status: AC | PRN
  Administered 2015-06-15: 11 via INTRAVENOUS

## 2015-06-16 NOTE — Progress Notes (Signed)
Cardiology Office Note:    Date:  06/17/2015   ID:  Donald Steele, DOB 1960/12/24, MRN [id number]  PCP:  Crisoforo Oxford, PA-C  Cardiologist:  Dr. Jenkins Rouge   Electrophysiologist:  n/a  Chief Complaint  Patient presents with  . Hospitalization Follow-up    New Dx of DCM >> OP Nuc Study High Risk    History of Present Illness:     Donald Steele is a 55 y.o. male with a hx of DM2, HL, PAD, tobacco abuse.  He was admitted 2/15-2/21 with symptoms of RLE limb threatening ischemia with ulcer formation and rest pain).  PV study demonstrated high grade porfunda femoral stenosis, occluded R SFA and severe tibial disease.  He underwent R profunda femoral to posterior tibial artery bypass by Dr. Kellie Simmering.  He was followed by Cardiology for surgical clearance. ECG was abnormal and Echo was obtained.  This demonstrated DCM with EF 35-40%.   He had no ischemic symptoms. He was started on angiotensin receptor blocker, beta-blocker.  OP Nuc Stress was recommended.  OP nuc stress test was high risk with infarct + peri-infarct ischemia.  He returns for FU.   Unfortunately, he was not DC on beta-blocker or angiotensin receptor blocker. Since DC, he is stable.  He does tell me that he had exertional chest tightness with mild exertion ~ 2 years ago (when he would go for walks).  However, he stopped walking when his leg started to bother him.  He has had some discomfort in his chest with certain activities as well as R leg claudication.  This seems to be much better since his RLE vascular surgery.  Denies orthopnea, PND.  R leg is swollen since his surgery. Denies syncope.  He still has an ulcer on his R foot.    Past Medical History  Diagnosis Date  . Diabetes mellitus   . Hyperlipidemia   . Carotid stenosis     a. Carotid US 2/17 - bilat ICA 1-39%  . Peripheral vascular disease due to secondary diabetes mellitus ([place])     a. s/p R Fem-Tibial bypass 2/17  . Cardiomyopathy ([place])     a. Echo 2/17 - EF  35-40%, mod diff HK, inf-septal, inf-lat AK, Gr 1 DD, mild MR  . Chronic combined systolic and diastolic CHF (congestive heart failure) ([place])   . History of nuclear stress test     a. Nuc study 2/17 - Myocardial perfusion is abnormal. Findings consistent with prior myocardial infarction (inf, inf-lat, apical inf, apical lateral) with peri-infarct ischemia in apical inferolateral and apical lateral regions. This is a high risk study. Overall left ventricular systolic function was abnormal. LV cavity size is severely enlarged. Nuclear stress EF: 29%.     Past Surgical History  Procedure Laterality Date  . No history of surgery     . Peripheral vascular catheterization N/A 06/01/2015    Procedure: Abdominal Aortogram;  Surgeon: Serafina Mitchell, MD;  Location: [facility];  Service: Cardiovascular;  Laterality: N/A;  . Femoral-tibial bypass graft Right 06/02/2015    Procedure: RIGHT PROFUNDA FEMORIS TO POSTERIOR TIBIAL ARTERY BYPASS USING RIGHT NON-REVERSED GREATER SAPHENOUS VEIN,  SUBCUTANEOUS TUNNEL;  Surgeon: Mal Misty, MD;  Location: [facility];  Service: Vascular;  Laterality: Right;  . Vein harvest Right 06/02/2015    Procedure: VEIN HARVEST RIGHT GREATER SAPHENOUS VEIN;  Surgeon: Mal Misty, MD;  Location: [facility];  Service: Vascular;  Laterality: Right;  . Endarterectomy femoral Right 06/02/2015  Procedure: ENDARTERECTOMY RIGHT PROFUNDA FEMORIS ARTERY;  Surgeon: Mal Misty, MD;  Location: [facility];  Service: Vascular;  Laterality: Right;  . Intraoperative arteriogram Right 06/02/2015    Procedure: INTRA OPERATIVE ARTERIOGRAM RIGHT LOWER LEG;  Surgeon: Mal Misty, MD;  Location: [facility];  Service: Vascular;  Laterality: Right;    Current Medications: Outpatient Prescriptions Prior to Visit  Medication Sig Dispense Refill  . aspirin EC 81 MG tablet Take 1 tablet (81 mg total) by mouth daily. 30 tablet 0  . atorvastatin (LIPITOR) 10 MG tablet Take 1 tablet (10 mg total) by mouth  daily. 30 tablet 11  . clobetasol ointment (TEMOVATE) [id number] % Apply 1 application topically 2 (two) times daily.    . collagenase (SANTYL) ointment Apply topically daily. Apply to right foot wound daily. Place moist saline gauze on top and wrap foot daily. 15 g 0  . furosemide (LASIX) 20 MG tablet Take 20 mg by mouth daily.     Marland Kitchen glipiZIDE (GLUCOTROL) 10 MG tablet Take 10 mg by mouth 2 (two) times daily.  0  . LYRICA 100 MG capsule Take 100 mg by mouth 2 (two) times daily.   2  . metFORMIN (GLUCOPHAGE) 1000 MG tablet Take 1,000 mg by mouth 2 (two) times daily with a meal.    . mupirocin ointment (BACTROBAN) 2 % Place 1 application into the nose 2 (two) times daily.    Marland Kitchen oxyCODONE (OXY IR/ROXICODONE) 5 MG immediate release tablet Take 1-2 tablets (5-10 mg total) by mouth every 4 (four) hours as needed for moderate pain. 30 tablet 0  . traMADol (ULTRAM) 50 MG tablet Take 1 tablet (50 mg total) by mouth every 6 (six) hours as needed. 15 tablet 0  . carvedilol (COREG) 3.125 MG tablet Take 1 tablet (3.125 mg total) by mouth 2 (two) times daily with a meal. (Patient not taking: Reported on 06/08/2015) 60 tablet 1  . doxycycline (VIBRAMYCIN) 100 MG capsule Take 100 mg by mouth 2 (two) times daily. Reported on 06/17/2015    . losartan (COZAAR) 50 MG tablet Take 1 tablet (50 mg total) by mouth daily. (Patient not taking: Reported on 06/08/2015) 30 tablet 1   No facility-administered medications prior to visit.     Allergies:   Review of patient's allergies indicates no known allergies.   Social History   Social History  . Marital Status: Single    Spouse Name: N/A  . Number of Children: N/A  . Years of Education: N/A   Social History Main Topics  . Smoking status: Current Every Day Smoker -- 1.00 packs/day for 35 years  . Smokeless tobacco: None  . Alcohol Use: No  . Drug Use: No  . Sexual Activity: Not Asked   Other Topics Concern  . None   Social History Narrative     Family History:  The  patient's family history is negative for Heart attack.   ROS:   Please see the history of present illness.    ROS All other systems reviewed and are negative.   Physical Exam:    VS:  BP 126/62 mmHg  Pulse 87  Ht 5\' 11"  (1.803 m)  Wt 182 lb (82.555 kg)  BMI 25.40 kg/m2   GEN: Well nourished, well developed, in no acute distress HEENT: normal Neck: no JVD, no masses Cardiac: Normal S1/S2, RRR; no murmurs, trace RLE edema;      Respiratory:  Decreased breath sounds bilaterally; no wheezing, rhonchi or rales GI: soft, nontender  MS: no deformity or atrophy Skin: RLE incisions healing well Neuro:  no focal deficits  Psych: Alert and oriented x 3, normal affect  Wt Readings from Last 3 Encounters:  06/17/15 182 lb (82.555 kg)  06/15/15 175 lb (79.379 kg)  06/08/15 182 lb (82.555 kg)      Studies/Labs Reviewed:     EKG:  EKG is  ordered today.  The ekg ordered today demonstrates NSR, HR 87, normal axis, inf Q waves, inf-lat TWI, QTc 469 ms  Recent Labs: 06/01/2015: ALT 17 06/03/2015: BUN <5*; Creatinine, Ser 0.74; Hemoglobin 11.8*; Platelets 198; Potassium 4.1; Sodium 142   Recent Lipid Panel No results found for: CHOL, TRIG, HDL, CHOLHDL, VLDL, LDLCALC, LDLDIRECT  Additional studies/ records that were reviewed today include:   Myoview 3/17 Overall Study Impression Myocardial perfusion is abnormal. Findings consistent with prior myocardial infarction (inf, inf-lat, apical inf, apical lateral) with peri-infarct ischemia in apical inferolateral and apical lateral regions. This is a high risk study. Overall left ventricular systolic function was abnormal. LV cavity size is severely enlarged. Nuclear stress EF: 29%. The left ventricular ejection fraction is severely decreased (<30%). There is no prior study for comparison.  Echo 2/17 EF 35-40%, mod diff HK, inf-septal, inf-lat AK, Gr 1 DD, mild MR  Carotid US 2/17 B/L 1-39% ICA   ASSESSMENT:     1. Dilated  cardiomyopathy ([place])   2. Chronic combined systolic and diastolic CHF (congestive heart failure) ([place])   3. Essential hypertension   4. Hyperlipidemia   5. Tobacco abuse   6. PAD (peripheral artery disease) ([facility])     PLAN:     In order of problems listed above:  1. Dilated CM - Likely ischemic CM with high risk nuc study with scar + peri-infarct ischemia and inf WMA on echo.  Patient should undergo cardiac cath for further evaluation. He has had what sounds like anginal symptoms in the past.  They overall seem to be stable and he is actually feeling better since his LE bypass.  I reviewed his case with Dr. Dorris Carnes (DOD).  We will arrange cardiac cath after he has his FU with Dr. Kellie Simmering.  Risks and benefits of cardiac catheterization have been discussed with the patient.  These include bleeding, infection, kidney damage, stroke, heart attack, death.  The patient understands these risks and is willing to proceed.     2. Combined Systolic and Diastolic CHF - Volume appears stable.  He is NYHA 2-2b.  He was not sent home on ARB or beta-blocker.  BP is usually normal.  I am not sure he can tolerate regular dose of Coreg and the Losartan he was given in the hospital.  I will place him on Losartan 25 mg QD, Toprol-XL 25 mg QD.  BMET in 1 week.   3. HTN - BP normal.    4. HL - Continue statin.   5. Tobacco abuse - He is trying to quit.    6. PAD - s/p R Fem-Tib bypass. FU with [facility] as planned.  As noted, will have him set up for heart cath after his FU with Dr. Kellie Simmering on 3/14.    Medication Adjustments/Labs and Tests Ordered: Current medicines are reviewed at length with the patient today.  Concerns regarding medicines are outlined above.  Medication changes, Labs and Tests ordered today are outlined in the Patient Instructions noted below. Patient Instructions  Medication Instructions:   START TAKING LOSARTAN 25 MG ONCE A DAY   START  TAKING TOPROL XL 25 MG ONCE A DAY   START TAKING  NITROGLYCERIN AS NEEDED FOR CHEST PAIN   If you need a refill on your cardiac medications before your next appointment, please call your pharmacy.  Labwork:  RETURN 06/24/15 FOR LABS BMET CBC PT/INR   Testing/Procedures:  SEE LETTER FOR L HEART CATH 06/30/15   Follow-Up:  2 WEEKS POST CATH WITH WEAVER    Any Other Special Instructions Will Be Listed Below (If Applicable).                                                                                                                                                     Signed, Richardson Dopp, PA-C  06/17/2015 12:08 PM    [facility] [address], [place], [place]  [address] Phone: [phone]; Fax: [phone]

## 2015-06-17 ENCOUNTER — Encounter: Payer: Self-pay | Admitting: Physician Assistant

## 2015-06-17 ENCOUNTER — Ambulatory Visit (INDEPENDENT_AMBULATORY_CARE_PROVIDER_SITE_OTHER): Payer: BLUE CROSS/BLUE SHIELD | Admitting: Physician Assistant

## 2015-06-17 ENCOUNTER — Encounter: Payer: Self-pay | Admitting: *Deleted

## 2015-06-17 VITALS — BP 126/62 | HR 87 | Ht 71.0 in | Wt 182.0 lb

## 2015-06-17 DIAGNOSIS — Z720 Tobacco use: Secondary | ICD-10-CM

## 2015-06-17 DIAGNOSIS — I739 Peripheral vascular disease, unspecified: Secondary | ICD-10-CM

## 2015-06-17 DIAGNOSIS — I10 Essential (primary) hypertension: Secondary | ICD-10-CM

## 2015-06-17 DIAGNOSIS — E785 Hyperlipidemia, unspecified: Secondary | ICD-10-CM

## 2015-06-17 DIAGNOSIS — I420 Dilated cardiomyopathy: Secondary | ICD-10-CM

## 2015-06-17 DIAGNOSIS — I5042 Chronic combined systolic (congestive) and diastolic (congestive) heart failure: Secondary | ICD-10-CM | POA: Diagnosis not present

## 2015-06-17 MED ORDER — NITROGLYCERIN 0.4 MG SL SUBL: 0.4000 mg | SUBLINGUAL_TABLET | SUBLINGUAL | Status: DC | PRN

## 2015-06-17 MED ORDER — LOSARTAN POTASSIUM 25 MG PO TABS: 25.0000 mg | ORAL_TABLET | Freq: Every day | ORAL | Status: DC

## 2015-06-17 MED ORDER — METOPROLOL SUCCINATE ER 25 MG PO TB24
25.0000 mg | ORAL_TABLET | Freq: Every day | ORAL | Status: DC
Start: 2015-06-17 — End: 2015-07-15

## 2015-06-17 NOTE — Patient Instructions (Addendum)
Medication Instructions:   START TAKING LOSARTAN 25 MG ONCE A DAY   START TAKING TOPROL XL 25 MG ONCE A DAY   START TAKING NITROGLYCERIN AS NEEDED FOR CHEST PAIN   If you need a refill on your cardiac medications before your next appointment, please call your pharmacy.  Labwork:  RETURN 06/24/15 FOR LABS BMET CBC PT/INR   Testing/Procedures:  SEE LETTER FOR L HEART CATH 06/30/15   Follow-Up:  2 WEEKS POST CATH WITH WEAVER    Any Other Special Instructions Will Be Listed Below (If Applicable).

## 2015-06-24 ENCOUNTER — Other Ambulatory Visit (INDEPENDENT_AMBULATORY_CARE_PROVIDER_SITE_OTHER): Payer: BLUE CROSS/BLUE SHIELD | Admitting: *Deleted

## 2015-06-24 ENCOUNTER — Emergency Department (EMERGENCY_DEPARTMENT_HOSPITAL)
Admit: 2015-06-24 | Discharge: 2015-06-24 | Disposition: A | Discharge status: Home / Self Care | Payer: BLUE CROSS/BLUE SHIELD

## 2015-06-24 ENCOUNTER — Emergency Department (HOSPITAL_COMMUNITY): Payer: BLUE CROSS/BLUE SHIELD

## 2015-06-24 ENCOUNTER — Encounter: Payer: Self-pay | Admitting: Medical

## 2015-06-24 ENCOUNTER — Encounter: Payer: Self-pay | Admitting: Vascular Surgery

## 2015-06-24 ENCOUNTER — Ambulatory Visit (INDEPENDENT_AMBULATORY_CARE_PROVIDER_SITE_OTHER): Payer: BLUE CROSS/BLUE SHIELD | Admitting: Medical

## 2015-06-24 ENCOUNTER — Emergency Department (HOSPITAL_COMMUNITY)
Admission: EM | Admit: 2015-06-24 | Discharge: 2015-06-24 | Disposition: A | Discharge status: Home / Self Care | Payer: BLUE CROSS/BLUE SHIELD | Attending: Emergency Medicine | Admitting: Emergency Medicine

## 2015-06-24 ENCOUNTER — Encounter (HOSPITAL_COMMUNITY): Payer: Self-pay

## 2015-06-24 VITALS — BP 150/80 | HR 77 | Wt 183.0 lb

## 2015-06-24 DIAGNOSIS — E1351 Other specified diabetes mellitus with diabetic peripheral angiopathy without gangrene: Secondary | ICD-10-CM | POA: Insufficient documentation

## 2015-06-24 DIAGNOSIS — I420 Dilated cardiomyopathy: Secondary | ICD-10-CM

## 2015-06-24 DIAGNOSIS — I739 Peripheral vascular disease, unspecified: Secondary | ICD-10-CM

## 2015-06-24 DIAGNOSIS — Z720 Tobacco use: Secondary | ICD-10-CM

## 2015-06-24 DIAGNOSIS — I5042 Chronic combined systolic (congestive) and diastolic (congestive) heart failure: Secondary | ICD-10-CM | POA: Insufficient documentation

## 2015-06-24 DIAGNOSIS — Z7952 Long term (current) use of systemic steroids: Secondary | ICD-10-CM | POA: Diagnosis not present

## 2015-06-24 DIAGNOSIS — F17200 Nicotine dependence, unspecified, uncomplicated: Secondary | ICD-10-CM | POA: Diagnosis not present

## 2015-06-24 DIAGNOSIS — E785 Hyperlipidemia, unspecified: Secondary | ICD-10-CM | POA: Insufficient documentation

## 2015-06-24 DIAGNOSIS — M79642 Pain in left hand: Secondary | ICD-10-CM | POA: Diagnosis present

## 2015-06-24 DIAGNOSIS — R231 Pallor: Secondary | ICD-10-CM

## 2015-06-24 DIAGNOSIS — Z7982 Long term (current) use of aspirin: Secondary | ICD-10-CM | POA: Diagnosis not present

## 2015-06-24 DIAGNOSIS — Z7984 Long term (current) use of oral hypoglycemic drugs: Secondary | ICD-10-CM | POA: Diagnosis not present

## 2015-06-24 DIAGNOSIS — I998 Other disorder of circulatory system: Secondary | ICD-10-CM | POA: Insufficient documentation

## 2015-06-24 DIAGNOSIS — M79645 Pain in left finger(s): Secondary | ICD-10-CM | POA: Diagnosis not present

## 2015-06-24 DIAGNOSIS — Z79899 Other long term (current) drug therapy: Secondary | ICD-10-CM | POA: Diagnosis not present

## 2015-06-24 DIAGNOSIS — I2510 Atherosclerotic heart disease of native coronary artery without angina pectoris: Secondary | ICD-10-CM | POA: Insufficient documentation

## 2015-06-24 LAB — BASIC METABOLIC PANEL
Anion gap: 10 (ref 5–15)
BUN: 16 mg/dL (ref 7–25)
BUN: 17 mg/dL (ref 6–20)
CALCIUM: 9.4 mg/dL (ref 8.6–10.3)
CHLORIDE: 104 mmol/L (ref 101–111)
CO2: 25 mmol/L (ref 20–31)
CO2: 26 mmol/L (ref 22–32)
CREATININE: 0.83 mg/dL (ref 0.61–1.24)
Calcium: 9.7 mg/dL (ref 8.9–10.3)
Chloride: 104 mmol/L (ref 98–110)
Creat: 0.97 mg/dL (ref 0.70–1.33)
GFR calc Af Amer: >60 mL/min (ref >60)
GFR calc non Af Amer: >60 mL/min (ref >60)
Glucose, Bld: 179 mg/dL — ABNORMAL HIGH (ref 65–99)
Glucose, Bld: 195 mg/dL — ABNORMAL HIGH (ref 65–99)
POTASSIUM: 4.9 mmol/L (ref 3.5–5.3)
Potassium: 4.9 mmol/L (ref 3.5–5.1)
SODIUM: 138 mmol/L (ref 135–146)
Sodium: 140 mmol/L (ref 135–145)

## 2015-06-24 LAB — CBC
HCT: 37.7 % — ABNORMAL LOW (ref 39.0–52.0)
Hemoglobin: 12.3 g/dL — ABNORMAL LOW (ref 13.0–17.0)
MCH: 28.8 pg (ref 26.0–34.0)
MCHC: 32.6 g/dL (ref 30.0–36.0)
MCV: 88.3 fL (ref 78.0–100.0)
MPV: 10.7 fL (ref 8.6–12.4)
PLATELETS: 390 10*3/uL (ref 150–400)
RBC: 4.27 MIL/uL (ref 4.22–5.81)
RDW: 13.2 % (ref 11.5–15.5)
WBC: 9.9 10*3/uL (ref 4.0–10.5)

## 2015-06-24 LAB — CBC WITH DIFFERENTIAL/PLATELET
Basophils Absolute: 0 10*3/uL (ref 0.0–0.1)
Basophils Relative: 0 %
Eosinophils Absolute: 0.4 10*3/uL (ref 0.0–0.7)
Eosinophils Relative: 4 %
HEMATOCRIT: 39.7 % (ref 39.0–52.0)
HEMOGLOBIN: 13 g/dL (ref 13.0–17.0)
LYMPHS ABS: 4.3 10*3/uL — AB (ref 0.7–4.0)
LYMPHS PCT: 45 %
MCH: 29.3 pg (ref 26.0–34.0)
MCHC: 32.7 g/dL (ref 30.0–36.0)
MCV: 89.4 fL (ref 78.0–100.0)
MONOS PCT: 10 %
Monocytes Absolute: 0.9 10*3/uL (ref 0.1–1.0)
NEUTROS ABS: 4 10*3/uL (ref 1.7–7.7)
NEUTROS PCT: 41 %
Platelets: 380 10*3/uL (ref 150–400)
RBC: 4.44 MIL/uL (ref 4.22–5.81)
RDW: 12.8 % (ref 11.5–15.5)
WBC: 9.7 10*3/uL (ref 4.0–10.5)

## 2015-06-24 LAB — PROTIME-INR
INR: 1.04 (ref <1.50)
INR: 1.07 (ref 0.00–1.49)
Prothrombin Time: 13.7 seconds (ref 11.6–15.2)
Prothrombin Time: 13.7 seconds (ref 11.6–15.2)

## 2015-06-24 LAB — APTT: aPTT: 29 seconds (ref 24–37)

## 2015-06-24 MED ORDER — OXYCODONE-ACETAMINOPHEN 5-325 MG PO TABS
1.0000 | ORAL_TABLET | Freq: Once | ORAL | Status: AC
  Administered 2015-06-24: 1 via ORAL
  Filled 2015-06-24: qty 1

## 2015-06-24 NOTE — Addendum Note (Signed)
Addended by: Eulis Foster on: 06/24/2015 10:19 AM   Modules accepted: Orders

## 2015-06-24 NOTE — Progress Notes (Signed)
Subjective: Chief Complaint  Patient presents with  . pinky finger pain    also wants a handicap placard. wants to know if he can combine the appt today with what he needed to come in on the 30th for   Here for concern about finger. He notes since his recent leg bypass surgery he has had some pain in left small finger.  The pain has gradually worsened.  In the last few days he has noted red/purple coloration of the finger, and pain is keeping him up at night.    He requests handicap placard as he is having trouble walking due to leg pain.    Past Medical History  Diagnosis Date  . Diabetes mellitus   . Hyperlipidemia   . Carotid stenosis     a. Carotid US 2/17 - bilat ICA 1-39%  . Peripheral vascular disease due to secondary diabetes mellitus ([place])     a. s/p R Fem-Tibial bypass 2/17  . Cardiomyopathy ([place])     a. Echo 2/17 - EF 35-40%, mod diff HK, inf-septal, inf-lat AK, Gr 1 DD, mild MR  . Chronic combined systolic and diastolic CHF (congestive heart failure) ([place])   . History of nuclear stress test     a. Nuc study 2/17 - Myocardial perfusion is abnormal. Findings consistent with prior myocardial infarction (inf, inf-lat, apical inf, apical lateral) with peri-infarct ischemia in apical inferolateral and apical lateral regions. This is a high risk study. Overall left ventricular systolic function was abnormal. LV cavity size is severely enlarged. Nuclear stress EF: 29%.    ROS as in subjective   Objective: BP 150/80 mmHg  Pulse 77  Wt 183 lb (83.008 kg)  Gen: wd, wn, nad Left small finger /5th finger tender along mid and distal phalanx.   Finger is slightly cool compared to surrounding fingers.  There is slight discoloration/purplish coloration of the distal phalanx of same finger.  Unable to get pulse oximetry reading on that finger.  All other fingers have pulse ox readings WNL, but left 2nd and 3rd fingers were a little slow to register but did show 98% oximetry Finger with  normal ROM, no swelling Rest of hand without deformity, normal ROM, nontender 1+ pulses of hands questionable, cap refill slow of left 5th finger, otherwise cap refill normal    Assessment: Encounter Diagnoses  Name Primary?  . Finger pain, left Yes  . Cool skin   . PAD (peripheral artery disease) ([place])   . Smoker      Plan: discussed his concerns, findings.   Called vascular surgery office to discuss.   It is recommended for him to have doppler arterial studies of the left upper extremity now for further eval.   He has known PAD, recent bypass surgery due to ischemic right leg.  He just had recent abnormal stress test and is suppose to be having heart catheterization soon.    He will report to [facility] ED now for further eval.

## 2015-06-24 NOTE — Discharge Instructions (Signed)
Please follow-up with Dr. Kellie Simmering regarding further management regarding the decreased flow in your left arm. Return for worsening symptoms including worsening pain or hand discoloration, fevers, or any other symptoms concerning to you.  Peripheral Vascular Disease Peripheral vascular disease (PVD) is a disease of the blood vessels that are not part of your heart and brain. A simple term for PVD is poor circulation. In most cases, PVD narrows the blood vessels that carry blood from your heart to the rest of your body. This can result in a decreased supply of blood to your arms, legs, and internal organs, like your stomach or kidneys. However, it most often affects a person's lower legs and feet. There are two types of PVD.  Organic PVD. This is the more common type. It is caused by damage to the structure of blood vessels.  Functional PVD. This is caused by conditions that make blood vessels contract and tighten (spasm). Without treatment, PVD tends to get worse over time. PVD can also lead to acute ischemic limb. This is when an arm or limb suddenly has trouble getting enough blood. This is a medical emergency. CAUSES Each type of PVD has many different causes. The most common cause of PVD is buildup of a fatty material (plaque) inside of your arteries (atherosclerosis). Small amounts of plaque can break off from the walls of the blood vessels and become lodged in a smaller artery. This blocks blood flow and can cause acute ischemic limb. Other common causes of PVD include:  Blood clots that form inside of blood vessels.  Injuries to blood vessels.  Diseases that cause inflammation of blood vessels or cause blood vessel spasms.  Health behaviors and health history that increase your risk of developing PVD. RISK FACTORS  You may have a greater risk of PVD if you:  Have a family history of PVD.  Have certain medical conditions, including:  High cholesterol.  Diabetes.  High blood  pressure (hypertension).  Coronary heart disease.  Past problems with blood clots.  Past injury, such as burns or a broken bone. These may have damaged blood vessels in your limbs.  Buerger disease. This is caused by inflamed blood vessels in your hands and feet.  Some forms of arthritis.  Rare birth defects that affect the arteries in your legs.  Use tobacco.  Do not get enough exercise.  Are obese.  Are age 20 or older. SIGNS AND SYMPTOMS  PVD may cause many different symptoms. Your symptoms depend on what part of your body is not getting enough blood. Some common signs and symptoms include:  Cramps in your lower legs. This may be a symptom of poor leg circulation (claudication).  Pain and weakness in your legs while you are physically active that goes away when you rest (intermittent claudication).  Leg pain when at rest.  Leg numbness, tingling, or weakness.  Coldness in a leg or foot, especially when compared with the other leg.  Skin or hair changes. These can include:  Hair loss.  Shiny skin.  Pale or bluish skin.  Thick toenails.  Inability to get or maintain an erection (erectile dysfunction). People with PVD are more prone to developing ulcers and sores on their toes, feet, or legs. These may take longer than normal to heal. DIAGNOSIS Your health care provider may diagnose PVD from your signs and symptoms. The health care provider will also do a physical exam. You may have tests to find out what is causing your PVD and determine  its severity. Tests may include:  Blood pressure recordings from your arms and legs and measurements of the strength of your pulses (pulse volume recordings).  Imaging studies using sound waves to take pictures of the blood flow through your blood vessels (Doppler ultrasound).  Injecting a dye into your blood vessels before having imaging studies using:  X-rays (angiogram or arteriogram).  Computer-generated X-rays (CT  angiogram).  A powerful electromagnetic field and a computer (magnetic resonance angiogram or MRA). TREATMENT Treatment for PVD depends on the cause of your condition and the severity of your symptoms. It also depends on your age. Underlying causes need to be treated and controlled. These include long-lasting (chronic) conditions, such as diabetes, high cholesterol, and high blood pressure. You may need to first try making lifestyle changes and taking medicines. Surgery may be needed if these do not work. Lifestyle changes may include:  Quitting smoking.  Exercising regularly.  Following a low-fat, low-cholesterol diet. Medicines may include:  Blood thinners to prevent blood clots.  Medicines to improve blood flow.  Medicines to improve your blood cholesterol levels. Surgical procedures may include:  A procedure that uses an inflated balloon to open a blocked artery and improve blood flow (angioplasty).  A procedure to put in a tube (stent) to keep a blocked artery open (stent implant).  Surgery to reroute blood flow around a blocked artery (peripheral bypass surgery).  Surgery to remove dead tissue from an infected wound on the affected limb.  Amputation. This is surgical removal of the affected limb. This may be necessary in cases of acute ischemic limb that are not improved through medical or surgical treatments. HOME CARE INSTRUCTIONS  Take medicines only as directed by your health care provider.  Do not use any tobacco products, including cigarettes, chewing tobacco, or electronic cigarettes. If you need help quitting, ask your health care provider.  Lose weight if you are overweight, and maintain a healthy weight as directed by your health care provider.  Eat a diet that is low in fat and cholesterol. If you need help, ask your health care provider.  Exercise regularly. Ask your health care provider to suggest some good activities for you.  Use compression stockings  or other mechanical devices as directed by your health care provider.  Take good care of your feet.  Wear comfortable shoes that fit well.  Check your feet often for any cuts or sores. SEEK MEDICAL CARE IF:  You have cramps in your legs while walking.  You have leg pain when you are at rest.  You have coldness in a leg or foot.  Your skin changes.  You have erectile dysfunction.  You have cuts or sores on your feet that are not healing. SEEK IMMEDIATE MEDICAL CARE IF:  Your arm or leg turns cold and blue.  Your arms or legs become red, warm, swollen, painful, or numb.  You have chest pain or trouble breathing.  You suddenly have weakness in your face, arm, or leg.  You become very confused or lose the ability to speak.  You suddenly have a very bad headache or lose your vision.   This information is not intended to replace advice given to you by your health care provider. Make sure you discuss any questions you have with your health care provider.   Document Released: 05/10/2004 Document Revised: 04/23/2014 Document Reviewed: 09/10/2013 Elsevier Interactive Patient Education [employer].

## 2015-06-24 NOTE — ED Notes (Signed)
Lab delay - vas [place] in process.

## 2015-06-24 NOTE — Progress Notes (Signed)
*  PRELIMINARY RESULTS* Vascular Ultrasound Upper Extremity Arterial Duplex has been completed.  The proximal left subclavian artery exhibits dampened monophasic waveforms, suggestive of proximal obstruction.   There is evidence of elevated velocities in the mid subclavian artery suggestive of >50% stenosis. This is supported by severe smooth plaque.  The left palmar arch is patent, however with tardus parvus flow.   The right subclavian artery is patent with biphasic flow.    Preliminary results discussed with Dr. Oleta Mouse.  06/24/2015 7:18 PM Maudry Mayhew, RVT, RDCS, RDMS

## 2015-06-24 NOTE — ED Notes (Addendum)
Patient states that he was sent over by PA at [facility]. Patient was sent over for possible blood in the left fifth finger.  Patient recently had a bypass in the right leg. Patient c/o left fifth finger  pain and coolness x 2 weeks. Patient denies any SOB.

## 2015-06-24 NOTE — ED Provider Notes (Signed)
CSN: [id number]     Arrival date & time 06/24/15  1649 History   First MD Initiated Contact with Patient 06/24/15 1719     Chief Complaint  Patient presents with  . Hand Pain    left fifth finger  . possible DVT finger      (Consider location/radiation/quality/duration/timing/severity/associated sxs/prior Treatment) HPI 55 year old male who presents with left hand pain. History of diabetes, CAD with dilated cardiomyopathy, hyperlipidemia, and peripheral teary old disease. In February had a right lower extremity bypass for acute limb ischemia by vascular surgery. States that he has been recovering well. Over the past week he has noted increased pain and cramping to the fingers of his left hand and has recently noted discoloration and mottling of the left fifth digit. He was seen by his family medicine physician today and sent to the ED for concern of arterial occlusion.  Denies fever, chills, nausea, vomiting, fever chills, chest pain, back pain, abdominal pain. Past Medical History  Diagnosis Date  . Diabetes mellitus   . Hyperlipidemia   . Carotid stenosis     a. Carotid US 2/17 - bilat ICA 1-39%  . Peripheral vascular disease due to secondary diabetes mellitus ([place])     a. s/p R Fem-Tibial bypass 2/17  . Cardiomyopathy ([place])     a. Echo 2/17 - EF 35-40%, mod diff HK, inf-septal, inf-lat AK, Gr 1 DD, mild MR  . Chronic combined systolic and diastolic CHF (congestive heart failure) ([place])   . History of nuclear stress test     a. Nuc study 2/17 - Myocardial perfusion is abnormal. Findings consistent with prior myocardial infarction (inf, inf-lat, apical inf, apical lateral) with peri-infarct ischemia in apical inferolateral and apical lateral regions. This is a high risk study. Overall left ventricular systolic function was abnormal. LV cavity size is severely enlarged. Nuclear stress EF: 29%.    Past Surgical History  Procedure Laterality Date  . No history of surgery     .  Peripheral vascular catheterization N/A 06/01/2015    Procedure: Abdominal Aortogram;  Surgeon: Serafina Mitchell, MD;  Location: [facility];  Service: Cardiovascular;  Laterality: N/A;  . Femoral-tibial bypass graft Right 06/02/2015    Procedure: RIGHT PROFUNDA FEMORIS TO POSTERIOR TIBIAL ARTERY BYPASS USING RIGHT NON-REVERSED GREATER SAPHENOUS VEIN,  SUBCUTANEOUS TUNNEL;  Surgeon: Mal Misty, MD;  Location: [facility];  Service: Vascular;  Laterality: Right;  . Vein harvest Right 06/02/2015    Procedure: VEIN HARVEST RIGHT GREATER SAPHENOUS VEIN;  Surgeon: Mal Misty, MD;  Location: [place];  Service: Vascular;  Laterality: Right;  . Endarterectomy femoral Right 06/02/2015    Procedure: ENDARTERECTOMY RIGHT PROFUNDA FEMORIS ARTERY;  Surgeon: Mal Misty, MD;  Location: [place];  Service: Vascular;  Laterality: Right;  . Intraoperative arteriogram Right 06/02/2015    Procedure: INTRA OPERATIVE ARTERIOGRAM RIGHT LOWER LEG;  Surgeon: Mal Misty, MD;  [facility];  Service: Vascular;  Laterality: Right;   Family History  Problem Relation Age of Onset  . Heart attack Neg Hx    Social History  Substance Use Topics  . Smoking status: Current Every Day Smoker -- 1.00 packs/day for 35 years  . Smokeless tobacco: Never Used  . Alcohol Use: No    Review of Systems 10/14 systems reviewed and are negative other than those stated in the HPI    Allergies  Review of patient's allergies indicates no known allergies.  Home Medications   Prior to Admission  medications   Medication Sig Start Date End Date Taking? Authorizing Provider  aspirin EC 81 MG tablet Take 1 tablet (81 mg total) by mouth daily. 06/07/15  Yes Alvia Grove, PA-C  atorvastatin (LIPITOR) 10 MG tablet Take 1 tablet (10 mg total) by mouth daily. 06/07/15  Yes Alvia Grove, PA-C  clobetasol ointment (TEMOVATE) AB-123456789 % Apply 1 application topically 2 (two) times daily.   Yes Historical Provider, MD  collagenase  (SANTYL) ointment Apply topically daily. Apply to right foot wound daily. Place moist saline gauze on top and wrap foot daily. 06/07/15  Yes Alvia Grove, PA-C  furosemide (LASIX) 20 MG tablet Take 20 mg by mouth daily.    Yes Historical Provider, MD  glipiZIDE (GLUCOTROL) 10 MG tablet Take 10 mg by mouth 2 (two) times daily. 04/27/15  Yes Historical Provider, MD  losartan (COZAAR) 25 MG tablet Take 1 tablet (25 mg total) by mouth daily. 06/17/15  Yes Scott T Weaver, PA-C  LYRICA 100 MG capsule Take 100 mg by mouth 2 (two) times daily. Reported on 06/24/2015 05/11/15  Yes Historical Provider, MD  metFORMIN (GLUCOPHAGE) 1000 MG tablet Take 1,000 mg by mouth 2 (two) times daily with a meal. Reported on 06/24/2015   Yes Historical Provider, MD  metoprolol succinate (TOPROL XL) 25 MG 24 hr tablet Take 1 tablet (25 mg total) by mouth daily. 06/17/15  Yes Liliane Shi, PA-C  mupirocin ointment (BACTROBAN) 2 % Place 1 application into the nose 2 (two) times daily. Reported on 06/24/2015   Yes Historical Provider, MD  oxyCODONE (OXY IR/ROXICODONE) 5 MG immediate release tablet Take 1-2 tablets (5-10 mg total) by mouth every 4 (four) hours as needed for moderate pain. 06/07/15  Yes Alvia Grove, PA-C  traMADol (ULTRAM) 50 MG tablet Take 1 tablet (50 mg total) by mouth every 6 (six) hours as needed. 05/26/15  Yes Hollace Kinnier Sofia, PA-C  nitroGLYCERIN (NITROSTAT) 0.4 MG SL tablet Place 1 tablet (0.4 mg total) under the tongue every 5 (five) minutes as needed for chest pain. 06/17/15   Scott T Weaver, PA-C   BP 143/73 mmHg  Pulse 73  Temp(Src) 97.7 F (36.5 C) (Oral)  Resp 16  Ht 5\' 11"  (1.803 m)  Wt 185 lb (83.915 kg)  BMI 25.81 kg/m2  SpO2 100% Physical Exam Physical Exam  Nursing note and vitals reviewed. Constitutional: Non-toxic, and in no acute distress Head: Normocephalic and atraumatic.  Mouth/Throat: Oropharynx is clear and moist.  Neck: Normal range of motion. Neck supple.  Cardiovascular: Normal  rate and regular rhythm. Non-palpable but dopplerable pulse of the left radial.  Pulmonary/Chest: Effort normal and breath sounds normal.  Abdominal: Soft. There is no tenderness. There is no rebound and no guarding.  Musculoskeletal: No deformities Neurological: Alert, no facial droop, fluent speech, moves all extremities symmetrically Skin: Diminished capillary refill of the left hand with some mottling/discoloration of the left pinky finger distally.   Psychiatric: Cooperative  ED Course  Procedures (including critical care time) Labs Review Labs Reviewed  CBC WITH DIFFERENTIAL/PLATELET - Abnormal; Notable for the following:    Lymphs Abs 4.3 (*)    All other components within normal limits  BASIC METABOLIC PANEL - Abnormal; Notable for the following:    Glucose, Bld 195 (*)    All other components within normal limits  APTT  PROTIME-INR    Imaging Review No results found. I have personally reviewed and evaluated these images and lab results as part of  my medical decision-making.   EKG Interpretation None      MDM   Final diagnoses:  PAD (peripheral artery disease) (HCC)  Limb ischemia   55 year old male with history of PAD, CAD, and DM who presents with left hand pain with c/f limb ischemia. Doppler US of LUE revealing stenosis at the level of subclavian artery, with dampened flow down the left upper extremity to the hand. Flow still identified in the palmar arch of hand. Discussed with Dr. Bridgett Larsson from vascular surgery who reviewed US arterial doppler. He states that this is chronically worsening issue, not acute. Did not recommend acute intervention. Has diminished but some flow in the hand. Recommending follow-up with Dr. Kellie Simmering as outpatient next week. He has scheduled appointment with vascular surgery in 4 days. Strict return and follow-up instructions reviewed. He expressed understanding of all discharge instructions and felt comfortable with the plan of  care.     Forde Dandy, MD 06/25/15 [phone]

## 2015-06-27 ENCOUNTER — Telehealth: Payer: Self-pay | Admitting: *Deleted

## 2015-06-27 NOTE — Telephone Encounter (Signed)
Lvm w/pt's mother to have ptcb for his lab results.

## 2015-06-27 NOTE — Telephone Encounter (Signed)
Pt has been notified of lab results and ok to proceed with cath 3/16. Pt advised to f/u w/PCP about elevated glucose. Pt verabalized understanding .

## 2015-06-28 ENCOUNTER — Ambulatory Visit (INDEPENDENT_AMBULATORY_CARE_PROVIDER_SITE_OTHER): Payer: Self-pay | Admitting: Vascular Surgery

## 2015-06-28 ENCOUNTER — Encounter: Payer: Self-pay | Admitting: Vascular Surgery

## 2015-06-28 ENCOUNTER — Telehealth: Payer: Self-pay | Admitting: Interventional Cardiology

## 2015-06-28 ENCOUNTER — Other Ambulatory Visit: Payer: Self-pay

## 2015-06-28 ENCOUNTER — Ambulatory Visit (HOSPITAL_COMMUNITY)
Admission: RE | Admit: 2015-06-28 | Discharge: 2015-06-28 | Disposition: A | Discharge status: Home / Self Care | Payer: BLUE CROSS/BLUE SHIELD | Source: Ambulatory Visit | Attending: Vascular Surgery | Admitting: Vascular Surgery

## 2015-06-28 VITALS — BP 106/83 | HR 87 | Temp 98.3°F | Ht 71.0 in | Wt 186.0 lb

## 2015-06-28 DIAGNOSIS — E1169 Type 2 diabetes mellitus with other specified complication: Secondary | ICD-10-CM | POA: Insufficient documentation

## 2015-06-28 DIAGNOSIS — I998 Other disorder of circulatory system: Secondary | ICD-10-CM

## 2015-06-28 DIAGNOSIS — E785 Hyperlipidemia, unspecified: Secondary | ICD-10-CM | POA: Diagnosis not present

## 2015-06-28 DIAGNOSIS — I999 Unspecified disorder of circulatory system: Secondary | ICD-10-CM | POA: Diagnosis not present

## 2015-06-28 DIAGNOSIS — M79602 Pain in left arm: Secondary | ICD-10-CM | POA: Diagnosis present

## 2015-06-28 DIAGNOSIS — I739 Peripheral vascular disease, unspecified: Secondary | ICD-10-CM

## 2015-06-28 DIAGNOSIS — I708 Atherosclerosis of other arteries: Secondary | ICD-10-CM | POA: Insufficient documentation

## 2015-06-28 NOTE — Telephone Encounter (Signed)
New message      Request for surgical clearance:  What type of surgery is being performed? Left upper ext angiogram When is this surgery scheduled? 07-07-15 Are there any medications that need to be held prior to surgery and how long? Need cardiac clearance after cath on 06-30-15 Name of physician performing surgery? Dr Kellie Simmering 1. What is your office phone and fax number? Fax [phone]

## 2015-06-28 NOTE — Telephone Encounter (Signed)
**Note De-identified Donald Steele Obfuscation** Please advise 

## 2015-06-28 NOTE — Progress Notes (Signed)
Subjective:     Patient ID: Donald Steele, male   DOB: [birth year], 55 y.o.   MRN: [id number]  HPI this 55 year old male returns 4 weeks post right profunda femoris to posterior tibial bypass using sahenous vein graft from right leg for ischemic ulcer right foot. Bridgett Larsson states his foot feels much better but that the ulcer has not healed. sono's that he was seen in the emergency department because of pain in his left fifth finger this was felt to be due to circulation. Patient is working on discontinuing smoking as we have spoken about this at length. He also noticed a small "lump" in the right inguinal wound. States that his left arm does fatigue easily at work.  Review of Systems     Objective:   Physical Exam BP 106/83 mmHg  Pulse 87  Temp(Src) 98.3 F (36.8 C) (Oral)  Ht 5\' 11"  (1.803 m)  Wt 186 lb (84.369 kg)  BMI 25.95 kg/m2  SpO2 97%   Gen. Alert and oriented 3 in no apparent distress Left upper extremity with no palpable brachial radial or ulnar pulse. Discoloration and discomfort to palpation the very distal end of the left fifth digit. Good strength otherwise. Monophasic Doppler flow on the left compared to biphasic flow on the right radial and ulnar arteries. Right lower extremity with well-healed surgical incisions. 3+ pulse in the subcutaneous tunnel medially and 2-3+ posterior tibial pulse palpable right foot. Dry eschar on dorsum of foot measuring 2.5 x 2.5 cm which is not infected. Not ready for any debridement. Small "lymphocele" and distal aspect of right inguinal wound measuring about 1-1/2 cm in diameter with no tenderness or erythema    today I ordered a duplex scan of the left upper extremity which I reviewed and interpreted. It appears that there is a severe stenosis of the left subclavian artery with very elevated velocities. Left vertebral was not visualized. Does not appear to be obstruction in the remaining vessels of the left upper extremity.  There is a 60 mm gradient  between right and left upper extremities with brachial pressure on the left 86 and on the right 156.     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  Long history of tobacco abuse   known coronary artery disease     Plan:      patient is scheduled for cardiac catheterization this Thursday, March 16 by Dr.Varanasi -I will discuss situation with him We have scheduled patient for angiography of aortic arch and left upper extremity on March 23rd by Dr. 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 He does not do a lot of walking.  We'll be in touch with [place] if ulceration on the dorsum of foot worsens

## 2015-06-29 NOTE — Telephone Encounter (Signed)
Follow Up   Pt called states that he was unaware that he was scheduled for a CATH. Request a call back to discuss

## 2015-06-29 NOTE — Telephone Encounter (Signed)
Patient aware of Cath tomorrow. Went over instructions with patient. Patient verbalized understanding.

## 2015-06-30 ENCOUNTER — Encounter (HOSPITAL_COMMUNITY): Payer: Self-pay | Admitting: Interventional Cardiology

## 2015-06-30 ENCOUNTER — Encounter (HOSPITAL_COMMUNITY)
Admission: RE | Disposition: A | Discharge status: Home / Self Care | Payer: Self-pay | Source: Ambulatory Visit | Attending: Interventional Cardiology

## 2015-06-30 ENCOUNTER — Ambulatory Visit (HOSPITAL_COMMUNITY)
Admission: RE | Admit: 2015-06-30 | Discharge: 2015-06-30 | Disposition: A | Discharge status: Home / Self Care | Payer: BLUE CROSS/BLUE SHIELD | Source: Ambulatory Visit | Attending: Interventional Cardiology | Admitting: Interventional Cardiology

## 2015-06-30 DIAGNOSIS — R9439 Abnormal result of other cardiovascular function study: Secondary | ICD-10-CM | POA: Insufficient documentation

## 2015-06-30 DIAGNOSIS — I110 Hypertensive heart disease with heart failure: Secondary | ICD-10-CM | POA: Insufficient documentation

## 2015-06-30 DIAGNOSIS — E1151 Type 2 diabetes mellitus with diabetic peripheral angiopathy without gangrene: Secondary | ICD-10-CM | POA: Diagnosis not present

## 2015-06-30 DIAGNOSIS — L97519 Non-pressure chronic ulcer of other part of right foot with unspecified severity: Secondary | ICD-10-CM | POA: Insufficient documentation

## 2015-06-30 DIAGNOSIS — I5042 Chronic combined systolic (congestive) and diastolic (congestive) heart failure: Secondary | ICD-10-CM | POA: Insufficient documentation

## 2015-06-30 DIAGNOSIS — F17210 Nicotine dependence, cigarettes, uncomplicated: Secondary | ICD-10-CM | POA: Diagnosis not present

## 2015-06-30 DIAGNOSIS — I252 Old myocardial infarction: Secondary | ICD-10-CM | POA: Insufficient documentation

## 2015-06-30 DIAGNOSIS — E785 Hyperlipidemia, unspecified: Secondary | ICD-10-CM | POA: Diagnosis not present

## 2015-06-30 DIAGNOSIS — I708 Atherosclerosis of other arteries: Secondary | ICD-10-CM | POA: Diagnosis not present

## 2015-06-30 DIAGNOSIS — Z7982 Long term (current) use of aspirin: Secondary | ICD-10-CM | POA: Diagnosis not present

## 2015-06-30 DIAGNOSIS — I6523 Occlusion and stenosis of bilateral carotid arteries: Secondary | ICD-10-CM | POA: Diagnosis not present

## 2015-06-30 DIAGNOSIS — Z7984 Long term (current) use of oral hypoglycemic drugs: Secondary | ICD-10-CM | POA: Insufficient documentation

## 2015-06-30 DIAGNOSIS — I2510 Atherosclerotic heart disease of native coronary artery without angina pectoris: Secondary | ICD-10-CM | POA: Diagnosis not present

## 2015-06-30 DIAGNOSIS — I420 Dilated cardiomyopathy: Secondary | ICD-10-CM | POA: Insufficient documentation

## 2015-06-30 DIAGNOSIS — E11621 Type 2 diabetes mellitus with foot ulcer: Secondary | ICD-10-CM | POA: Insufficient documentation

## 2015-06-30 HISTORY — PX: CARDIAC CATHETERIZATION: SHX172

## 2015-06-30 LAB — GLUCOSE, CAPILLARY
GLUCOSE-CAPILLARY: 107 mg/dL — AB (ref 65–99)
GLUCOSE-CAPILLARY: 90 mg/dL (ref 65–99)

## 2015-06-30 SURGERY — LEFT HEART CATH AND CORONARY ANGIOGRAPHY
Anesthesia: LOCAL

## 2015-06-30 MED ORDER — ASPIRIN 81 MG PO CHEW
CHEWABLE_TABLET | ORAL | Status: AC
  Filled 2015-06-30: qty 1

## 2015-06-30 MED ORDER — MIDAZOLAM HCL 2 MG/2ML IJ SOLN
INTRAMUSCULAR | Status: AC
  Filled 2015-06-30: qty 2

## 2015-06-30 MED ORDER — SODIUM CHLORIDE 0.9 % IV SOLN
INTRAVENOUS | Status: DC
  Administered 2015-06-30: 08:00:00 via INTRAVENOUS

## 2015-06-30 MED ORDER — SODIUM CHLORIDE 0.9 % WEIGHT BASED INFUSION: 1.0000 mL/kg/h | INTRAVENOUS | Status: DC

## 2015-06-30 MED ORDER — SODIUM CHLORIDE 0.9% FLUSH: 3.0000 mL | Freq: Two times a day (BID) | INTRAVENOUS | Status: DC

## 2015-06-30 MED ORDER — SODIUM CHLORIDE 0.9 % IV SOLN: 250.0000 mL | INTRAVENOUS | Status: DC | PRN

## 2015-06-30 MED ORDER — HEPARIN (PORCINE) IN NACL 2-0.9 UNIT/ML-% IJ SOLN
INTRAMUSCULAR | Status: AC
  Filled 2015-06-30: qty 1000

## 2015-06-30 MED ORDER — HEPARIN SODIUM (PORCINE) 1000 UNIT/ML IJ SOLN
INTRAMUSCULAR | Status: DC | PRN
  Administered 2015-06-30: 4000 [IU] via INTRAVENOUS

## 2015-06-30 MED ORDER — METFORMIN HCL 1000 MG PO TABS: 1000.0000 mg | ORAL_TABLET | Freq: Two times a day (BID) | ORAL | Status: DC

## 2015-06-30 MED ORDER — IOHEXOL 350 MG/ML SOLN
INTRAVENOUS | Status: DC | PRN
  Administered 2015-06-30: 130 mL via INTRAVENOUS

## 2015-06-30 MED ORDER — LIDOCAINE HCL (PF) 1 % IJ SOLN
INTRAMUSCULAR | Status: AC
  Filled 2015-06-30: qty 30

## 2015-06-30 MED ORDER — VERAPAMIL HCL 2.5 MG/ML IV SOLN
INTRAVENOUS | Status: DC | PRN
  Administered 2015-06-30: 10:00:00 via INTRA_ARTERIAL

## 2015-06-30 MED ORDER — ASPIRIN 81 MG PO CHEW
81.0000 mg | CHEWABLE_TABLET | ORAL | Status: AC
  Administered 2015-06-30: 81 mg via ORAL

## 2015-06-30 MED ORDER — VERAPAMIL HCL 2.5 MG/ML IV SOLN
INTRAVENOUS | Status: AC
  Filled 2015-06-30: qty 2

## 2015-06-30 MED ORDER — LIDOCAINE HCL (PF) 1 % IJ SOLN
INTRAMUSCULAR | Status: DC | PRN
  Administered 2015-06-30: 5 mL

## 2015-06-30 MED ORDER — HEPARIN (PORCINE) IN NACL 2-0.9 UNIT/ML-% IJ SOLN
INTRAMUSCULAR | Status: DC | PRN
  Administered 2015-06-30 (×2): 1000 mL

## 2015-06-30 MED ORDER — SODIUM CHLORIDE 0.9% FLUSH: 3.0000 mL | INTRAVENOUS | Status: DC | PRN

## 2015-06-30 MED ORDER — MIDAZOLAM HCL 2 MG/2ML IJ SOLN
INTRAMUSCULAR | Status: DC | PRN
  Administered 2015-06-30: 1 mg via INTRAVENOUS
  Administered 2015-06-30: 2 mg via INTRAVENOUS

## 2015-06-30 MED ORDER — FENTANYL CITRATE (PF) 100 MCG/2ML IJ SOLN
INTRAMUSCULAR | Status: DC | PRN
  Administered 2015-06-30 (×2): 25 ug via INTRAVENOUS

## 2015-06-30 MED ORDER — FENTANYL CITRATE (PF) 100 MCG/2ML IJ SOLN
INTRAMUSCULAR | Status: AC
  Filled 2015-06-30: qty 2

## 2015-06-30 MED ORDER — HEPARIN SODIUM (PORCINE) 1000 UNIT/ML IJ SOLN
INTRAMUSCULAR | Status: AC
  Filled 2015-06-30: qty 1

## 2015-06-30 SURGICAL SUPPLY — 15 items
CATH INFINITI 5 FR 3DRC (CATHETERS) ×2 IMPLANT
CATH INFINITI 5 FR JL3.5 (CATHETERS) ×2 IMPLANT
CATH INFINITI 5FR ANG PIGTAIL (CATHETERS) ×2 IMPLANT
CATH INFINITI JR4 5F (CATHETERS) ×2 IMPLANT
COVER PRB 48X5XTLSCP FOLD TPE (BAG) ×1 IMPLANT
COVER PROBE 5X48 (BAG) ×1
DEVICE RAD COMP TR BAND LRG (VASCULAR PRODUCTS) ×2 IMPLANT
GLIDESHEATH SLEND SS 6F .021 (SHEATH) ×2 IMPLANT
KIT HEART LEFT (KITS) ×2 IMPLANT
PACK CARDIAC CATHETERIZATION (CUSTOM PROCEDURE TRAY) ×2 IMPLANT
SYR MEDRAD MARK V 150ML (SYRINGE) ×2 IMPLANT
TRANSDUCER W/STOPCOCK (MISCELLANEOUS) ×2 IMPLANT
TUBING CIL FLEX 10 FLL-RA (TUBING) ×2 IMPLANT
WIRE HI TORQ VERSACORE-J 145CM (WIRE) ×2 IMPLANT
WIRE SAFE-T 1.5MM-J .035X260CM (WIRE) ×2 IMPLANT

## 2015-06-30 NOTE — H&P (View-Only) (Signed)
Cardiology Office Note:    Date:  06/17/2015   ID:  Donald Steele, DOB October 19, 1960, MRN [id number]  PCP:  Crisoforo Oxford, PA-C  Cardiologist:  Dr. Jenkins Rouge   Electrophysiologist:  n/a  Chief Complaint  Patient presents with  . Hospitalization Follow-up    New Dx of DCM >> OP Nuc Study High Risk    History of Present Illness:     Donald Steele is a 55 y.o. male with a hx of DM2, HL, PAD, tobacco abuse.  He was admitted 2/15-2/21 with symptoms of RLE limb threatening ischemia with ulcer formation and rest pain).  PV study demonstrated high grade porfunda femoral stenosis, occluded R SFA and severe tibial disease.  He underwent R profunda femoral to posterior tibial artery bypass by Dr. Kellie Simmering.  He was followed by Cardiology for surgical clearance. ECG was abnormal and Echo was obtained.  This demonstrated DCM with EF 35-40%.   He had no ischemic symptoms. He was started on angiotensin receptor blocker, beta-blocker.  OP Nuc Stress was recommended.  OP nuc stress test was high risk with infarct + peri-infarct ischemia.  He returns for FU.   Unfortunately, he was not DC on beta-blocker or angiotensin receptor blocker. Since DC, he is stable.  He does tell me that he had exertional chest tightness with mild exertion ~ 2 years ago (when he would go for walks).  However, he stopped walking when his leg started to bother him.  He has had some discomfort in his chest with certain activities as well as R leg claudication.  This seems to be much better since his RLE vascular surgery.  Denies orthopnea, PND.  R leg is swollen since his surgery. Denies syncope.  He still has an ulcer on his R foot.    Past Medical History  Diagnosis Date  . Diabetes mellitus   . Hyperlipidemia   . Carotid stenosis     a. Carotid US 2/17 - bilat ICA 1-39%  . Peripheral vascular disease due to secondary diabetes mellitus ([place])     a. s/p R Fem-Tibial bypass 2/17  . Cardiomyopathy ([place])     a. Echo 2/17 - EF  35-40%, mod diff HK, inf-septal, inf-lat AK, Gr 1 DD, mild MR  . Chronic combined systolic and diastolic CHF (congestive heart failure) ([place])   . History of nuclear stress test     a. Nuc study 2/17 - Myocardial perfusion is abnormal. Findings consistent with prior myocardial infarction (inf, inf-lat, apical inf, apical lateral) with peri-infarct ischemia in apical inferolateral and apical lateral regions. This is a high risk study. Overall left ventricular systolic function was abnormal. LV cavity size is severely enlarged. Nuclear stress EF: 29%.     Past Surgical History  Procedure Laterality Date  . No history of surgery     . Peripheral vascular catheterization N/A 06/01/2015    Procedure: Abdominal Aortogram;  Surgeon: Serafina Mitchell, MD;  Location: [place];  Service: Cardiovascular;  Laterality: N/A;  . Femoral-tibial bypass graft Right 06/02/2015    Procedure: RIGHT PROFUNDA FEMORIS TO POSTERIOR TIBIAL ARTERY BYPASS USING RIGHT NON-REVERSED GREATER SAPHENOUS VEIN,  SUBCUTANEOUS TUNNEL;  Surgeon: Mal Misty, MD;  Location: [facility];  Service: Vascular;  Laterality: Right;  . Vein harvest Right 06/02/2015    Procedure: VEIN HARVEST RIGHT GREATER SAPHENOUS VEIN;  Surgeon: Mal Misty, MD;  Location: [place];  Service: Vascular;  Laterality: Right;  . Endarterectomy femoral Right 06/02/2015  Procedure: ENDARTERECTOMY RIGHT PROFUNDA FEMORIS ARTERY;  Surgeon: Mal Misty, MD;  Location: [place];  Service: Vascular;  Laterality: Right;  . Intraoperative arteriogram Right 06/02/2015    Procedure: INTRA OPERATIVE ARTERIOGRAM RIGHT LOWER LEG;  Surgeon: Mal Misty, MD;  Location: [facility];  Service: Vascular;  Laterality: Right;    Current Medications: Outpatient Prescriptions Prior to Visit  Medication Sig Dispense Refill  . aspirin EC 81 MG tablet Take 1 tablet (81 mg total) by mouth daily. 30 tablet 0  . atorvastatin (LIPITOR) 10 MG tablet Take 1 tablet (10 mg total) by mouth  daily. 30 tablet 11  . clobetasol ointment (TEMOVATE) AB-123456789 % Apply 1 application topically 2 (two) times daily.    . collagenase (SANTYL) ointment Apply topically daily. Apply to right foot wound daily. Place moist saline gauze on top and wrap foot daily. 15 g 0  . furosemide (LASIX) 20 MG tablet Take 20 mg by mouth daily.     Marland Kitchen glipiZIDE (GLUCOTROL) 10 MG tablet Take 10 mg by mouth 2 (two) times daily.  0  . LYRICA 100 MG capsule Take 100 mg by mouth 2 (two) times daily.   2  . metFORMIN (GLUCOPHAGE) 1000 MG tablet Take 1,000 mg by mouth 2 (two) times daily with a meal.    . mupirocin ointment (BACTROBAN) 2 % Place 1 application into the nose 2 (two) times daily.    Marland Kitchen oxyCODONE (OXY IR/ROXICODONE) 5 MG immediate release tablet Take 1-2 tablets (5-10 mg total) by mouth every 4 (four) hours as needed for moderate pain. 30 tablet 0  . traMADol (ULTRAM) 50 MG tablet Take 1 tablet (50 mg total) by mouth every 6 (six) hours as needed. 15 tablet 0  . carvedilol (COREG) 3.125 MG tablet Take 1 tablet (3.125 mg total) by mouth 2 (two) times daily with a meal. (Patient not taking: Reported on 06/08/2015) 60 tablet 1  . doxycycline (VIBRAMYCIN) 100 MG capsule Take 100 mg by mouth 2 (two) times daily. Reported on 06/17/2015    . losartan (COZAAR) 50 MG tablet Take 1 tablet (50 mg total) by mouth daily. (Patient not taking: Reported on 06/08/2015) 30 tablet 1   No facility-administered medications prior to visit.     Allergies:   Review of patient's allergies indicates no known allergies.   Social History   Social History  . Marital Status: Single    Spouse Name: N/A  . Number of Children: N/A  . Years of Education: N/A   Social History Main Topics  . Smoking status: Current Every Day Smoker -- 1.00 packs/day for 35 years  . Smokeless tobacco: None  . Alcohol Use: No  . Drug Use: No  . Sexual Activity: Not Asked   Other Topics Concern  . None   Social History Narrative     Family History:  The  patient's family history is negative for Heart attack.   ROS:   Please see the history of present illness.    ROS All other systems reviewed and are negative.   Physical Exam:    VS:  BP 126/62 mmHg  Pulse 87  Ht 5\' 11"  (1.803 m)  Wt 182 lb (82.555 kg)  BMI 25.40 kg/m2   GEN: Well nourished, well developed, in no acute distress HEENT: normal Neck: no JVD, no masses Cardiac: Normal S1/S2, RRR; no murmurs, trace RLE edema;      Respiratory:  Decreased breath sounds bilaterally; no wheezing, rhonchi or rales GI: soft, nontender  MS: no deformity or atrophy Skin: RLE incisions healing well Neuro:  no focal deficits  Psych: Alert and oriented x 3, normal affect  Wt Readings from Last 3 Encounters:  06/17/15 182 lb (82.555 kg)  06/15/15 175 lb (79.379 kg)  06/08/15 182 lb (82.555 kg)      Studies/Labs Reviewed:     EKG:  EKG is  ordered today.  The ekg ordered today demonstrates NSR, HR 87, normal axis, inf Q waves, inf-lat TWI, QTc 469 ms  Recent Labs: 06/01/2015: ALT 17 06/03/2015: BUN <5*; Creatinine, Ser 0.74; Hemoglobin 11.8*; Platelets 198; Potassium 4.1; Sodium 142   Recent Lipid Panel No results found for: CHOL, TRIG, HDL, CHOLHDL, VLDL, LDLCALC, LDLDIRECT  Additional studies/ records that were reviewed today include:   Myoview 3/17 Overall Study Impression Myocardial perfusion is abnormal. Findings consistent with prior myocardial infarction (inf, inf-lat, apical inf, apical lateral) with peri-infarct ischemia in apical inferolateral and apical lateral regions. This is a high risk study. Overall left ventricular systolic function was abnormal. LV cavity size is severely enlarged. Nuclear stress EF: 29%. The left ventricular ejection fraction is severely decreased (<30%). There is no prior study for comparison.  Echo 2/17 EF 35-40%, mod diff HK, inf-septal, inf-lat AK, Gr 1 DD, mild MR  Carotid US 2/17 B/L 1-39% ICA   ASSESSMENT:     1. Dilated  cardiomyopathy ([place])   2. Chronic combined systolic and diastolic CHF (congestive heart failure) ([place])   3. Essential hypertension   4. Hyperlipidemia   5. Tobacco abuse   6. PAD (peripheral artery disease) ([facility])     PLAN:     In order of problems listed above:  1. Dilated CM - Likely ischemic CM with high risk nuc study with scar + peri-infarct ischemia and inf WMA on echo.  Patient should undergo cardiac cath for further evaluation. He has had what sounds like anginal symptoms in the past.  They overall seem to be stable and he is actually feeling better since his LE bypass.  I reviewed his case with Dr. Dorris Carnes (DOD).  We will arrange cardiac cath after he has his FU with Dr. Kellie Simmering.  Risks and benefits of cardiac catheterization have been discussed with the patient.  These include bleeding, infection, kidney damage, stroke, heart attack, death.  The patient understands these risks and is willing to proceed.     2. Combined Systolic and Diastolic CHF - Volume appears stable.  He is NYHA 2-2b.  He was not sent home on ARB or beta-blocker.  BP is usually normal.  I am not sure he can tolerate regular dose of Coreg and the Losartan he was given in the hospital.  I will place him on Losartan 25 mg QD, Toprol-XL 25 mg QD.  BMET in 1 week.   3. HTN - BP normal.    4. HL - Continue statin.   5. Tobacco abuse - He is trying to quit.    6. PAD - s/p R Fem-Tib bypass. FU with [facility] as planned.  As noted, will have him set up for heart cath after his FU with Dr. Kellie Simmering on 3/14.    Medication Adjustments/Labs and Tests Ordered: Current medicines are reviewed at length with the patient today.  Concerns regarding medicines are outlined above.  Medication changes, Labs and Tests ordered today are outlined in the Patient Instructions noted below. Patient Instructions  Medication Instructions:   START TAKING LOSARTAN 25 MG ONCE A DAY   START  TAKING TOPROL XL 25 MG ONCE A DAY   START TAKING  NITROGLYCERIN AS NEEDED FOR CHEST PAIN   If you need a refill on your cardiac medications before your next appointment, please call your pharmacy.  Labwork:  RETURN 06/24/15 FOR LABS BMET CBC PT/INR   Testing/Procedures:  SEE LETTER FOR L HEART CATH 06/30/15   Follow-Up:  2 WEEKS POST CATH WITH Dee Maday    Any Other Special Instructions Will Be Listed Below (If Applicable).                                                                                                                                                     Signed, Richardson Dopp, PA-C  06/17/2015 12:08 PM    [facility] [address], [place], [place]  [address] Phone: [phone]; Fax: [phone]

## 2015-06-30 NOTE — Interval H&P Note (Signed)
Cath Lab Visit (complete for each Cath Lab visit)  Clinical Evaluation Leading to the Procedure:   ACS: No.  Non-ACS:    Anginal Classification: CCS III  Anti-ischemic medical therapy: Minimal Therapy (1 class of medications)  Non-Invasive Test Results: High-risk stress test findings: cardiac mortality >3%/year  Prior CABG: No previous CABG      History and Physical Interval Note:  06/30/2015 9:13 AM  Donald Steele  has presented today for surgery, with the diagnosis of Cardiomyopathh   The various methods of treatment have been discussed with the patient and family. After consideration of risks, benefits and other options for treatment, the patient has consented to  Procedure(s): Left Heart Cath and Coronary Angiography (N/A) as a surgical intervention .  The patient's history has been reviewed, patient examined, no change in status, stable for surgery.  I have reviewed the patient's chart and labs.  Questions were answered to the patient's satisfaction.     VARANASI,JAYADEEP S.

## 2015-06-30 NOTE — Progress Notes (Signed)
Pt instructed to hold Metformin for 48 hours.

## 2015-06-30 NOTE — Progress Notes (Signed)
Called Cath Lab regarding B/P, Judge Stall, instructed the MD aware of B/P readings and it is due to subclavian blockage, unable to check B/P on legs due to vascular issues and unable to use Right Arm due to TRB.

## 2015-06-30 NOTE — Discharge Instructions (Signed)
Radial Site Care °Refer to this sheet in the next few weeks. These instructions provide you with information about caring for yourself after your procedure. Your health care provider may also give you more specific instructions. Your treatment has been planned according to current medical practices, but problems sometimes occur. Call your health care provider if you have any problems or questions after your procedure. °WHAT TO EXPECT AFTER THE PROCEDURE °After your procedure, it is typical to have the following: °· Bruising at the radial site that usually fades within 1-2 weeks. °· Blood collecting in the tissue (hematoma) that may be painful to the touch. It should usually decrease in size and tenderness within 1-2 weeks. °HOME CARE INSTRUCTIONS °· Take medicines only as directed by your health care provider. °· You may shower 24-48 hours after the procedure or as directed by your health care provider. Remove the bandage (dressing) and gently wash the site with plain soap and water. Pat the area dry with a clean towel. Do not rub the site, because this may cause bleeding. °· Do not take baths, swim, or use a hot tub until your health care provider approves. °· Check your insertion site every day for redness, swelling, or drainage. °· Do not apply powder or lotion to the site. °· Do not flex or bend the affected arm for 24 hours or as directed by your health care provider. °· Do not push or pull heavy objects with the affected arm for 24 hours or as directed by your health care provider. °· Do not lift over 10 lb (4.5 kg) for 5 days after your procedure or as directed by your health care provider. °· Ask your health care provider when it is okay to: °¨ Return to work or school. °¨ Resume usual physical activities or sports. °¨ Resume sexual activity. °· Do not drive home if you are discharged the same day as the procedure. Have someone else drive you. °· You may drive 24 hours after the procedure unless otherwise  instructed by your health care provider. °· Do not operate machinery or power tools for 24 hours after the procedure. °· If your procedure was done as an outpatient procedure, which means that you went home the same day as your procedure, a responsible adult should be with you for the first 24 hours after you arrive home. °· Keep all follow-up visits as directed by your health care provider. This is important. °SEEK MEDICAL CARE IF: °· You have a fever. °· You have chills. °· You have increased bleeding from the radial site. Hold pressure on the site. °SEEK IMMEDIATE MEDICAL CARE IF: °· You have unusual pain at the radial site. °· You have redness, warmth, or swelling at the radial site. °· You have drainage (other than a small amount of blood on the dressing) from the radial site. °· The radial site is bleeding, and the bleeding does not stop after 30 minutes of holding steady pressure on the site. °· Your arm or hand becomes pale, cool, tingly, or numb. °  °This information is not intended to replace advice given to you by your health care provider. Make sure you discuss any questions you have with your health care provider. °  °Document Released: 05/05/2010 Document Revised: 04/23/2014 Document Reviewed: 10/19/2013 °Elsevier Interactive Patient Education ©2016 [employer] ° °

## 2015-07-01 ENCOUNTER — Encounter (HOSPITAL_COMMUNITY): Payer: Self-pay | Admitting: Interventional Cardiology

## 2015-07-06 ENCOUNTER — Ambulatory Visit: Payer: BLUE CROSS/BLUE SHIELD | Admitting: Medical

## 2015-07-07 ENCOUNTER — Ambulatory Visit (HOSPITAL_COMMUNITY)
Admission: RE | Admit: 2015-07-07 | Discharge: 2015-07-07 | Disposition: A | Discharge status: Home / Self Care | Payer: BLUE CROSS/BLUE SHIELD | Source: Ambulatory Visit | Attending: Vascular Surgery | Admitting: Vascular Surgery

## 2015-07-07 ENCOUNTER — Encounter (HOSPITAL_COMMUNITY)
Admission: RE | Disposition: A | Discharge status: Home / Self Care | Payer: Self-pay | Source: Ambulatory Visit | Attending: Vascular Surgery

## 2015-07-07 DIAGNOSIS — Z87891 Personal history of nicotine dependence: Secondary | ICD-10-CM | POA: Insufficient documentation

## 2015-07-07 DIAGNOSIS — E119 Type 2 diabetes mellitus without complications: Secondary | ICD-10-CM | POA: Diagnosis not present

## 2015-07-07 DIAGNOSIS — I708 Atherosclerosis of other arteries: Secondary | ICD-10-CM | POA: Insufficient documentation

## 2015-07-07 DIAGNOSIS — I998 Other disorder of circulatory system: Secondary | ICD-10-CM | POA: Diagnosis not present

## 2015-07-07 DIAGNOSIS — I2510 Atherosclerotic heart disease of native coronary artery without angina pectoris: Secondary | ICD-10-CM | POA: Diagnosis not present

## 2015-07-07 DIAGNOSIS — I771 Stricture of artery: Secondary | ICD-10-CM

## 2015-07-07 HISTORY — PX: PERIPHERAL VASCULAR CATHETERIZATION: SHX172C

## 2015-07-07 LAB — POCT ACTIVATED CLOTTING TIME
ACTIVATED CLOTTING TIME: 229 s
ACTIVATED CLOTTING TIME: 188 s
Activated Clotting Time: 224 seconds

## 2015-07-07 LAB — POCT I-STAT, CHEM 8
BUN: 10 mg/dL (ref 6–20)
CALCIUM ION: 1.2 mmol/L (ref 1.12–1.23)
CHLORIDE: 104 mmol/L (ref 101–111)
Creatinine, Ser: 0.7 mg/dL (ref 0.61–1.24)
Glucose, Bld: 284 mg/dL — ABNORMAL HIGH (ref 65–99)
HEMATOCRIT: 41 % (ref 39.0–52.0)
Hemoglobin: 13.9 g/dL (ref 13.0–17.0)
Potassium: 4.5 mmol/L (ref 3.5–5.1)
SODIUM: 139 mmol/L (ref 135–145)
TCO2: 24 mmol/L (ref 0–100)

## 2015-07-07 LAB — GLUCOSE, CAPILLARY: Glucose-Capillary: 203 mg/dL — ABNORMAL HIGH (ref 65–99)

## 2015-07-07 SURGERY — AORTIC ARCH ANGIOGRAPHY

## 2015-07-07 MED ORDER — CLOPIDOGREL BISULFATE 75 MG PO TABS: 75.0000 mg | ORAL_TABLET | Freq: Every day | ORAL | Status: DC

## 2015-07-07 MED ORDER — LIDOCAINE HCL (PF) 1 % IJ SOLN
INTRAMUSCULAR | Status: DC | PRN
  Administered 2015-07-07: 20 mL

## 2015-07-07 MED ORDER — ADENOSINE 6 MG/2ML IV SOLN
INTRAVENOUS | Status: AC
  Filled 2015-07-07: qty 2

## 2015-07-07 MED ORDER — ACETAMINOPHEN 325 MG PO TABS: 650.0000 mg | ORAL_TABLET | ORAL | Status: DC | PRN

## 2015-07-07 MED ORDER — HEPARIN (PORCINE) IN NACL 2-0.9 UNIT/ML-% IJ SOLN
INTRAMUSCULAR | Status: DC | PRN
  Administered 2015-07-07: 1000 mL

## 2015-07-07 MED ORDER — CLOPIDOGREL BISULFATE 300 MG PO TABS
ORAL_TABLET | ORAL | Status: DC | PRN
  Administered 2015-07-07: 300 mg via ORAL

## 2015-07-07 MED ORDER — SODIUM CHLORIDE 0.9 % IV SOLN: 1.0000 mL/kg/h | INTRAVENOUS | Status: DC

## 2015-07-07 MED ORDER — HYDRALAZINE HCL 20 MG/ML IJ SOLN: 10.0000 mg | INTRAMUSCULAR | Status: DC | PRN

## 2015-07-07 MED ORDER — MIDAZOLAM HCL 2 MG/2ML IJ SOLN
INTRAMUSCULAR | Status: AC
  Filled 2015-07-07: qty 2

## 2015-07-07 MED ORDER — FENTANYL CITRATE (PF) 100 MCG/2ML IJ SOLN
INTRAMUSCULAR | Status: DC | PRN
  Administered 2015-07-07: 50 ug via INTRAVENOUS

## 2015-07-07 MED ORDER — HEPARIN SODIUM (PORCINE) 1000 UNIT/ML IJ SOLN
INTRAMUSCULAR | Status: AC
  Filled 2015-07-07: qty 1

## 2015-07-07 MED ORDER — FENTANYL CITRATE (PF) 100 MCG/2ML IJ SOLN
INTRAMUSCULAR | Status: AC
  Filled 2015-07-07: qty 2

## 2015-07-07 MED ORDER — OXYCODONE-ACETAMINOPHEN 5-325 MG PO TABS: 1.0000 | ORAL_TABLET | Freq: Four times a day (QID) | ORAL | Status: DC | PRN

## 2015-07-07 MED ORDER — CLOPIDOGREL BISULFATE 300 MG PO TABS
ORAL_TABLET | ORAL | Status: AC
  Filled 2015-07-07: qty 1

## 2015-07-07 MED ORDER — SODIUM CHLORIDE 0.9 % IV SOLN
INTRAVENOUS | Status: DC
  Administered 2015-07-07: 06:00:00 via INTRAVENOUS

## 2015-07-07 MED ORDER — MIDAZOLAM HCL 2 MG/2ML IJ SOLN
INTRAMUSCULAR | Status: DC | PRN
  Administered 2015-07-07: 1 mg via INTRAVENOUS

## 2015-07-07 MED ORDER — IODIXANOL 320 MG/ML IV SOLN
INTRAVENOUS | Status: DC | PRN
  Administered 2015-07-07: 193 mL via INTRAVENOUS

## 2015-07-07 MED ORDER — HEPARIN SODIUM (PORCINE) 1000 UNIT/ML IJ SOLN
INTRAMUSCULAR | Status: DC | PRN
  Administered 2015-07-07: 2000 [IU] via INTRAVENOUS
  Administered 2015-07-07: 8000 [IU] via INTRAVENOUS

## 2015-07-07 SURGICAL SUPPLY — 17 items
CATH ANGIO 5F BER2 100CM (CATHETERS) ×4 IMPLANT
CATH ANGIO 5F PIGTAIL 100CM (CATHETERS) ×4 IMPLANT
KIT ENCORE 26 ADVANTAGE (KITS) ×4 IMPLANT
KIT MICROINTRODUCER STIFF 5F (SHEATH) ×4 IMPLANT
KIT PV (KITS) ×4 IMPLANT
SHEATH GUIDING CAROTID 6FRX90 (SHEATH) ×4 IMPLANT
SHEATH PINNACLE 5F 10CM (SHEATH) ×4 IMPLANT
SHEATH PINNACLE 6F 10CM (SHEATH) ×4 IMPLANT
SHEATH SHUTTLE SELECT 6F (SHEATH) ×4 IMPLANT
STENT OMNI ELITE 7.0X16X135 (Permanent Stent) ×4 IMPLANT
STENT OMNILINK ELITE 6X29X135 (Stent) ×4 IMPLANT
SYR MEDRAD MARK V 150ML (SYRINGE) ×4 IMPLANT
TRANSDUCER W/STOPCOCK (MISCELLANEOUS) ×4 IMPLANT
TRAY PV CATH (CUSTOM PROCEDURE TRAY) ×4 IMPLANT
WIRE BENTSON .035X145CM (WIRE) ×4 IMPLANT
WIRE HI TORQ VERSACORE J 260CM (WIRE) ×8 IMPLANT
WIRE ROSEN-J .035X260CM (WIRE) ×4 IMPLANT

## 2015-07-07 NOTE — Progress Notes (Signed)
Site area: LFA Site Prior to Removal:  Level 0 Pressure Applied For:25 min Manual: yes   Patient Status During Pull: stable  Post Pull Site:  Level 0 Post Pull Instructions Given:  yes Post Pull Pulses Present: palpable Dressing Applied:  tegaderm Bedrest begins @ 1030 till 1430 Comments:

## 2015-07-07 NOTE — Discharge Instructions (Signed)
Angiogram, Care After °Refer to this sheet in the next few weeks. These instructions provide you with information about caring for yourself after your procedure. Your health care provider may also give you more specific instructions. Your treatment has been planned according to current medical practices, but problems sometimes occur. Call your health care provider if you have any problems or questions after your procedure. °WHAT TO EXPECT AFTER THE PROCEDURE °After your procedure, it is typical to have the following: °· Bruising at the catheter insertion site that usually fades within 1-2 weeks. °· Blood collecting in the tissue (hematoma) that may be painful to the touch. It should usually decrease in size and tenderness within 1-2 weeks. °HOME CARE INSTRUCTIONS °· Take medicines only as directed by your health care provider. °· You may shower 24-48 hours after the procedure or as directed by your health care provider. Remove the bandage (dressing) and gently wash the site with plain soap and water. Pat the area dry with a clean towel. Do not rub the site, because this may cause bleeding. °· Do not take baths, swim, or use a hot tub until your health care provider approves. °· Check your insertion site every day for redness, swelling, or drainage. °· Do not apply powder or lotion to the site. °· Do not lift over 10 lb (4.5 kg) for 5 days after your procedure or as directed by your health care provider. °· Ask your health care provider when it is okay to: °¨ Return to work or school. °¨ Resume usual physical activities or sports. °¨ Resume sexual activity. °· Do not drive home if you are discharged the same day as the procedure. Have someone else drive you. °· You may drive 24 hours after the procedure unless otherwise instructed by your health care provider. °· Do not operate machinery or power tools for 24 hours after the procedure or as directed by your health care provider. °· If your procedure was done as an  outpatient procedure, which means that you went home the same day as your procedure, a responsible adult should be with you for the first 24 hours after you arrive home. °· Keep all follow-up visits as directed by your health care provider. This is important. °SEEK MEDICAL CARE IF: °· You have a fever. °· You have chills. °· You have increased bleeding from the catheter insertion site. Hold pressure on the site. °SEEK IMMEDIATE MEDICAL CARE IF: °· You have unusual pain at the catheter insertion site. °· You have redness, warmth, or swelling at the catheter insertion site. °· You have drainage (other than a small amount of blood on the dressing) from the catheter insertion site. °· The catheter insertion site is bleeding, and the bleeding does not stop after 30 minutes of holding steady pressure on the site. °· The area near or just beyond the catheter insertion site becomes pale, cool, tingly, or numb. °  °This information is not intended to replace advice given to you by your health care provider. Make sure you discuss any questions you have with your health care provider. °  °Document Released: 10/19/2004 Document Revised: 04/23/2014 Document Reviewed: 09/03/2012 °Elsevier Interactive Patient Education ©2016 [employer] ° °

## 2015-07-07 NOTE — Op Note (Signed)
OPERATIVE NOTE   PROCEDURE: 1.  Left common femoral artery cannulation under ultrasound guidance 2.  Placement of catheter in aorta 3.  Arch Aortogram 4.  Conscious sedation for 71 minutes 5.  Third order selection 6.  Left arm angiogram 7.  Angioplasty and stenting of left subclavian artery x 2 (7 mm x 16 mm, 6 mm x 29 mm)  PRE-OPERATIVE DIAGNOSIS: left hand ischemia, likely subclavian artery stenosis  POST-OPERATIVE DIAGNOSIS: same as above   SURGEON: Adele Barthel, MD  ANESTHESIA: conscious sedation  ESTIMATED BLOOD LOSS: 50 cc  CONTRAST: 193 cc  FINDING(S):  Patent aortic arch: bovine configuration  Patent innominate artery, proximal right subclavian artery, and proximal right common carotid artery  Patent proximal left common carotid artery  Patent left subclavian artery with"  Proximal 1/3 stenosis: 70-80 mm Hg gradient, resolved with stenting  Distal 1/3 stenosis >90%: resolved with stenting  Patent left axillary and brachial artery  Normal patent bifurcation into radial and ulnar artery  Patent palmar arch with patent double digital arteries to each finger, 5th finger visualized somewhat poorly due to patient movement   SPECIMEN(S):  none  INDICATIONS:   Donald Steele is a 55 y.o. male who presents with left hand ischemia.  The patient presents for: arch aortogram, left arm angiogram, and possible intervention.  I discussed with the patient the nature of angiographic procedures, especially the limited patencies of any endovascular intervention.  The patient is aware of that the risks of an angiographic procedure include but are not limited to: bleeding, infection, access site complications, renal failure, embolization, rupture of vessel, dissection, possible need for emergent surgical intervention, possible need for surgical procedures to treat the patient's pathology, and stroke and death.  The patient is aware of the risks and agrees to  proceed.  DESCRIPTION: After full informed consent was obtained from the patient, the patient was brought back to the angiography suite.  The patient was placed supine upon the angiography table and connected to cardiopulmonary monitoring equipment.  The patient was then given conscious sedation, the amounts of which are documented in the patient's chart.  A circulating radiologic technician maintained continuous monitoring of the patient's cardiopulmonary status.  Additionally, the control room radiologic technician provided backup monitoring throughout the procedure.  The patient was prepped and drape in the standard fashion for an angiographic procedure.  At this point, attention was turned to the left groin.  Under ultrasound guidance, the subcutaneous tissue surrounding the left common femoral artery was anesthesized with 1% lidocaine with epinephrine.  The artery was then cannulated with a micropuncture needle.  The microwire was advanced into the iliac arterial system.  The needle was exchanged for a microsheath, which was loaded into the common femoral artery over the wire.  The microwire was exchanged for a Bentson wire which was advanced into the aorta.  The microsheath was then exchanged for a 5-Fr sheath which was loaded into the common femoral artery.  A long pigtail catheter was loaded over the wire.  I advanced the catheter into the ascending aorta.  I removed the wire and then connected the catheter to the power injector circuit.  A 40 degree LAO arch aortogram was completed.  The catheter was exchanged for a BER-2 catheter.  I selected the left subclavian artery and advanced the wire and catheter into the subclavian artery.  The left subclavian artery was imaged with hand injections.  I then then process the distal 1/3 lesion, advancing the wire and  catheter into the left axillary artery.  I then complete the left arm angiogram via stations.  Based on the images, I felt the distal 1/3 lesion  needed stenting.  I was then going to reinterrogate the proximal 1/3 of the left subclavian artery as there was a suggestion of a stenosis in this segment.  I pulled back the catheter into mid-subclavian artery.  I did another injection to confirm position.  I readvanced the catheter over a Bentson wire through the lesion and then exchanged the wire for a long Rosen wire.    The patient was given 8000 units of Heparin intravenously, which was a therapeutic bolus. In total, 10000 units of Heparin was administrated to achieve and maintain a therapeutic level of anticoagulation.  I exchanged the left femoral sheath for a long 6-Fr Destination sheath which I lodged into the proximal 1/3 of the left subclavian artery.  Based on injection images, I selected a 6 mm x 29 mm balloon expandable stent.  This stent was centered on the distal 1/3 stenosis in the left subclavian artery.  The stent was inflated at 11 atm for 1 minutes.  A definite waist was imaged during this process.  I removed the balloon and the completion images demonstrated complete resolution of the stenosis.  I then pulled back the sheath into the proximal 1/3 of the left subclavian artery and did a hand injection.  Based on the images, I did not see stenosis suggested by the arch aortogram.  I reloaded the dilator in the sheath and and readvanced the sheath into the mid-left subclavian artery.  I did a pull back gradient using the sheath and measured a 70-80 mm Hg gradient across a ring appearing segment in the proximal 1/3 of the left subclavian artery.  Based on the images, I selected a 7 mm x 16 mm stent for this segment.  The stent was centered on the ring appearing segment and inflated to 11 atm for 1 minute.  This demonstrated a significant waist, which resolved.  I removed the balloon and reconnected the sheath to the power injector.  A repeat power injection of the left subclavian artery was completed.  This demonstrated resolution of the two  stenotic segment.  At this point, I pulled back the [facility] wire through the stent under fluoroscopy.  I then exchanged the left sheath for a short 6-Fr sheath.  The sheath was aspirated.  No clots were present and the sheath was reloaded with heparinized saline.     COMPLICATIONS: none  CONDITION: stable   Adele Barthel, MD [facility] Office: [phone] Pager: [phone]  07/07/2015, 9:25 AM

## 2015-07-07 NOTE — Interval H&P Note (Signed)
History and Physical Interval Note:  07/07/2015 7:18 AM  Donald Steele  has presented today for surgery, with the diagnosis of left subclavion artery stenosis  The various methods of treatment have been discussed with the patient and family. After consideration of risks, benefits and other options for treatment, the patient has consented to  Procedure(s): Aortic Arch Angiography (N/A) Upper Extremity Angiography (N/A) as a surgical intervention .  The patient's history has been reviewed, patient examined, no change in status, stable for surgery.  I have reviewed the patient's chart and labs.  Questions were answered to the patient's satisfaction.     Adele Barthel

## 2015-07-07 NOTE — Progress Notes (Signed)
Inpatient Diabetes Program Recommendations  AACE/ADA: New Consensus Statement on Inpatient Glycemic Control (2015)  Target Ranges:  Prepandial:   less than 140 mg/dL      Peak postprandial:   less than 180 mg/dL (1-2 hours)      Critically ill patients:  140 - 180 mg/dL   Results for Donald Steele, Donald Steele (MRN [id number]) as of 07/07/2015 11:12  Ref. Range 07/07/2015 06:18  Glucose Latest Ref Range: 65-99 mg/dL 284 (H)    Results for Donald Steele, Donald Steele (MRN [id number]) as of 07/07/2015 11:12  Ref. Range 07/07/2015 09:47  Glucose-Capillary Latest Ref Range: 65-99 mg/dL 203 (H)   Review of Glycemic Control  Diabetes history: DM 2 Outpatient Diabetes medications: Glipizide 10 mg BID, Metformin 1,000 mg BID Current orders for Inpatient glycemic control: None  Inpatient Diabetes Program Recommendations: Correction (SSI): Glucose in the 200's this am. Patient on 2 oral DM meds at home. Please consider starting Novolog Moderate Correction + HS scale.  Thanks,  [name] RN, MSN, [facility] Inpatient Diabetes Coordinator Team Pager [phone] (8a-5p)

## 2015-07-07 NOTE — H&P (View-Only) (Signed)
Subjective:     Patient ID: Donald Steele, male   DOB: 1960/07/21, 55 y.o.   MRN: [id number]  HPI this 55 year old male returns 4 weeks post right profunda femoris to posterior tibial bypass using sahenous vein graft from right leg for ischemic ulcer right foot. Bridgett Larsson states his foot feels much better but that the ulcer has not healed. sono's that he was seen in the emergency department because of pain in his left fifth finger this was felt to be due to circulation. Patient is working on discontinuing smoking as we have spoken about this at length. He also noticed a small "lump" in the right inguinal wound. States that his left arm does fatigue easily at work.  Review of Systems     Objective:   Physical Exam BP 106/83 mmHg  Pulse 87  Temp(Src) 98.3 F (36.8 C) (Oral)  Ht 5\' 11"  (1.803 m)  Wt 186 lb (84.369 kg)  BMI 25.95 kg/m2  SpO2 97%   Gen. Alert and oriented 3 in no apparent distress Left upper extremity with no palpable brachial radial or ulnar pulse. Discoloration and discomfort to palpation the very distal end of the left fifth digit. Good strength otherwise. Monophasic Doppler flow on the left compared to biphasic flow on the right radial and ulnar arteries. Right lower extremity with well-healed surgical incisions. 3+ pulse in the subcutaneous tunnel medially and 2-3+ posterior tibial pulse palpable right foot. Dry eschar on dorsum of foot measuring 2.5 x 2.5 cm which is not infected. Not ready for any debridement. Small "lymphocele" and distal aspect of right inguinal wound measuring about 1-1/2 cm in diameter with no tenderness or erythema    today I ordered a duplex scan of the left upper extremity which I reviewed and interpreted. It appears that there is a severe stenosis of the left subclavian artery with very elevated velocities. Left vertebral was not visualized. Does not appear to be obstruction in the remaining vessels of the left upper extremity.  There is a 60 mm gradient  between right and left upper extremities with brachial pressure on the left 86 and on the right 156.     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  Long history of tobacco abuse   known coronary artery disease     Plan:      patient is scheduled for cardiac catheterization this Thursday, March 16 by Dr.Varanasi -I will discuss situation with him We have scheduled patient for angiography of aortic arch and left upper extremity on March 23rd by Dr. 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 He does not do a lot of walking.  We'll be in touch with [place] if ulceration on the dorsum of foot worsens

## 2015-07-08 ENCOUNTER — Other Ambulatory Visit: Payer: Self-pay | Admitting: Vascular Surgery

## 2015-07-08 ENCOUNTER — Telehealth: Payer: Self-pay | Admitting: Vascular Surgery

## 2015-07-08 NOTE — Telephone Encounter (Signed)
-----   Message from Denman George, RN sent at 07/07/2015  9:47 AM EDT ----- Regarding: needs f/u with Dr. Kellie Simmering in 2-4 wks.    ----- Message -----    From: Conrad Clyde, MD    Sent: 07/07/2015   9:41 AM      To: [facility] [address]  Donald Steele [id number] 07/03/1960  PROCEDURE: 1.  Left common femoral artery cannulation under ultrasound guidance 2.  Placement of catheter in aorta 3.  Arch Aortogram 4.  Conscious sedation for 71 minutes 5.  Third order selection 6.  Left arm angiogram 7.  Angioplasty and stenting of left subclavian artery x 2 (7 mm x 16 mm, 6 mm x 29 mm)  Follow-up: Dr. Kellie Simmering 2-4 weeks

## 2015-07-11 ENCOUNTER — Encounter (HOSPITAL_COMMUNITY): Payer: Self-pay | Admitting: Vascular Surgery

## 2015-07-13 NOTE — Progress Notes (Signed)
Cardiology Office Note:    Date:  07/14/2015   ID:  [name], DOB [birth year], MRN [id number]  PCP:  Crisoforo Oxford, PA-C  Cardiologist:  Dr. Jenkins Rouge   Electrophysiologist:  n/a  Chief Complaint  Patient presents with  . Coronary Artery Disease    s/p Cardiac cath    History of Present Illness:     Donald Steele is a 55 y.o. male with a hx of DM2, HL, PAD, tobacco abuse.  He was admitted 2/17 with symptoms of RLE limb threatening ischemia with ulcer formation and rest pain.  PV study demonstrated high grade porfunda femoral stenosis, occluded R SFA and severe tibial disease.  He underwent R profunda femoral to posterior tibial artery bypass by Dr. Kellie Simmering.  He was followed by Cardiology for surgical clearance. ECG was abnormal and Echo was obtained.  This demonstrated DCM with EF 35-40%.   He had no ischemic symptoms. He was started on angiotensin receptor blocker, beta-blocker.  OP Nuc Stress was recommended.  OP nuc stress test was high risk with infarct + peri-infarct ischemia.  When I saw him in FU 06/17/15, he was not on ARB or beta-blocker.  I put him on low dose Losartan and low dose Toprol-XL.  I arrange cardiac cath to further evaluate.  LHC 06/30/15 demonstrated small, diffusely diseased coronary vessels and no high risk lesions.  If the patient has continued angina, PCI of the RI could be considered.  This is a long diffusely diseased vessel.  The mid LAD could also be studied with FFR.  He was then brought into the hospital by Dr. Bridgett Larsson with an ischemic left fifth finger probably secondary to left subclavian stenosis. Angiogram on 07/07/15 demonstrated greater than 90% stenosis in the left subclavian artery which was treated with angioplasty and stenting.  Returns for FU.  Overall, he is doing well. Denies chest discomfort. Denies significant dyspnea. He's not been that active since his most recent procedure. Denies without new, PND or edema. Denies syncope.   Past  Medical History  Diagnosis Date  . Diabetes mellitus   . Hyperlipidemia   . Carotid stenosis     a. Carotid US 2/17 - bilat ICA 1-39%  . Peripheral vascular disease due to secondary diabetes mellitus ([place])     a. s/p R Fem-Tibial bypass 2/17  . Cardiomyopathy ([place])     a. Echo 2/17 - EF 35-40%, mod diff HK, inf-septal, inf-lat AK, Gr 1 DD, mild MR  . Chronic combined systolic and diastolic CHF (congestive heart failure) ([place])   . History of nuclear stress test     a. Nuc study 2/17 - Myocardial perfusion is abnormal. Findings consistent with prior myocardial infarction (inf, inf-lat, apical inf, apical lateral) with peri-infarct ischemia in apical inferolateral and apical lateral regions. This is a high risk study. Overall left ventricular systolic function was abnormal. LV cavity size is severely enlarged. Nuclear stress EF: 29%.     Past Surgical History  Procedure Laterality Date  . No history of surgery     . Peripheral vascular catheterization N/A 06/01/2015    Procedure: Abdominal Aortogram;  Surgeon: Serafina Mitchell, MD;  Location: [facility];  Service: Cardiovascular;  Laterality: N/A;  . Femoral-tibial bypass graft Right 06/02/2015    Procedure: RIGHT PROFUNDA FEMORIS TO POSTERIOR TIBIAL ARTERY BYPASS USING RIGHT NON-REVERSED GREATER SAPHENOUS VEIN,  SUBCUTANEOUS TUNNEL;  Surgeon: Mal Misty, MD;  Location: [facility];  Service: Vascular;  Laterality: Right;  .  Vein harvest Right 06/02/2015    Procedure: VEIN HARVEST RIGHT GREATER SAPHENOUS VEIN;  Surgeon: Mal Misty, MD;  Location: [facility];  Service: Vascular;  Laterality: Right;  . Endarterectomy femoral Right 06/02/2015    Procedure: ENDARTERECTOMY RIGHT PROFUNDA FEMORIS ARTERY;  Surgeon: Mal Misty, MD;  Location: [facility];  Service: Vascular;  Laterality: Right;  . Intraoperative arteriogram Right 06/02/2015    Procedure: INTRA OPERATIVE ARTERIOGRAM RIGHT LOWER LEG;  Surgeon: Mal Misty, MD;  Location: [facility];   Service: Vascular;  Laterality: Right;  . Cardiac catheterization N/A 06/30/2015    Procedure: Left Heart Cath and Coronary Angiography;  Surgeon: Jettie Booze, MD;  Location: [facility];  Service: Cardiovascular;  Laterality: N/A;  . Peripheral vascular catheterization N/A 07/07/2015    Procedure: Aortic Arch Angiography;  Surgeon: Conrad [place], MD;  Location: [facility];  Service: Cardiovascular;  Laterality: N/A;  . Peripheral vascular catheterization N/A 07/07/2015    Procedure: Upper Extremity Angiography;  Surgeon: Conrad [place], MD;  Location: [facility];  Service: Cardiovascular;  Laterality: N/A;  . Peripheral vascular catheterization Left 07/07/2015    Procedure: Peripheral Vascular Intervention;  Surgeon: Conrad Manchester, MD;  Location: [facility];  Service: Cardiovascular;  Laterality: Left;  left subclavian and axillary    Current Medications: Outpatient Prescriptions Prior to Visit  Medication Sig Dispense Refill  . aspirin 81 MG EC tablet TAKE 1 TABLET EVERY DAY 30 tablet 10  . clopidogrel (PLAVIX) 75 MG tablet Take 1 tablet (75 mg total) by mouth daily. 30 tablet 11  . collagenase (SANTYL) ointment Apply topically daily. Apply to right foot wound daily. Place moist saline gauze on top and wrap foot daily. 15 g 0  . furosemide (LASIX) 20 MG tablet Take 20 mg by mouth daily.     Marland Kitchen glipiZIDE (GLUCOTROL) 10 MG tablet Take 10 mg by mouth 2 (two) times daily.  0  . losartan (COZAAR) 25 MG tablet Take 1 tablet (25 mg total) by mouth daily. (Patient taking differently: Take 50 mg by mouth daily. ) 30 tablet 6  . LYRICA 100 MG capsule Take 100 mg by mouth 2 (two) times daily. Reported on 06/24/2015  2  . metFORMIN (GLUCOPHAGE) 1000 MG tablet Take 1 tablet (1,000 mg total) by mouth 2 (two) times daily with a meal. Reported on 06/24/2015    . metoprolol succinate (TOPROL XL) 25 MG 24 hr tablet Take 1 tablet (25 mg total) by mouth daily. 30 tablet 6  .  nitroGLYCERIN (NITROSTAT) 0.4 MG SL tablet Place 1 tablet (0.4 mg total) under the tongue every 5 (five) minutes as needed for chest pain. 25 tablet 3  . atorvastatin (LIPITOR) 10 MG tablet Take 1 tablet (10 mg total) by mouth daily. 30 tablet 11  . oxyCODONE (OXY IR/ROXICODONE) 5 MG immediate release tablet Take 1-2 tablets (5-10 mg total) by mouth every 4 (four) hours as needed for moderate pain. (Patient not taking: Reported on 07/14/2015) 30 tablet 0  . traMADol (ULTRAM) 50 MG tablet Take 1 tablet (50 mg total) by mouth every 6 (six) hours as needed. (Patient not taking: Reported on 07/14/2015) 15 tablet 0   No facility-administered medications prior to visit.     Allergies:   Review of patient's allergies indicates no known allergies.   Social History   Social History  . Marital Status: Single    Spouse Name: N/A  . Number of Children:  N/A  . Years of Education: N/A   Social History Main Topics  . Smoking status: Current Every Day Smoker -- 1.00 packs/day for 35 years  . Smokeless tobacco: Never Used  . Alcohol Use: No  . Drug Use: No  . Sexual Activity: Not Asked   Other Topics Concern  . None   Social History Narrative     Family History:  The patient's family history is negative for Heart attack.   ROS:   Please see the history of present illness.    ROS All other systems reviewed and are negative.   Physical Exam:    VS:  BP 100/58 mmHg  Pulse 74  Ht 5\' 11"  (1.803 m)  Wt 190 lb (86.183 kg)  BMI 26.51 kg/m2   GEN: Well nourished, well developed, in no acute distress HEENT: normal Neck: no JVD, no masses Cardiac: Normal S1/S2, RRR; no murmurs, no edema;     right wrist without hematoma or mass  Respiratory:  Decreased breath sounds bilaterally; no wheezing, rhonchi or rales GI: soft, nontender  MS: no deformity or atrophy Skin: Warm and dry  Psych: Alert and oriented x 3, normal affect  Wt Readings from Last 3 Encounters:  07/14/15 190 lb (86.183 kg)    07/07/15 180 lb (81.647 kg)  06/30/15 183 lb (83.008 kg)      Studies/Labs Reviewed:     EKG:  EKG is  ordered today.  The ekg ordered today demonstrates NSR, HR 75, normal axis, inferior Q waves, T-wave inversions in 2, 3, aVF, V4-V6, QTc 446 ms, no change from prior tracing   Recent Labs: 06/01/2015: ALT 17 06/24/2015: Platelets 380 07/07/2015: BUN 10; Creatinine, Ser 0.70; Hemoglobin 13.9; Potassium 4.5; Sodium 139   Recent Lipid Panel No results found for: CHOL, TRIG, HDL, CHOLHDL, VLDL, LDLCALC, LDLDIRECT  Additional studies/ records that were reviewed today include:   UE angiogram 07/07/15 S/p stent to L subclavian artery  LHC 06/30/15 LAD mid 50%, distal 50% RI 75% LCx ok RPDA 99% L-R collats EF 35-45% Generally small, diffusely diseased coronary vessels. No high risk lesions at this time. Given his lack of symptoms, would favor medical therapy. Would allow him to have his PV procedure performed. Hopefully, his foot ulcer and his left hand ulcer will heal. If he develops angina, could bring him back for treatment of the ramus which is a long, diffusely diseased vessel. Could also pressure wire the mid LAD.   UE Vasc US 06/24/15 L subclavian stenosis > 50%  Myoview 3/17 Overall Study Impression Myocardial perfusion is abnormal. Findings consistent with prior myocardial infarction (inf, inf-lat, apical inf, apical lateral) with peri-infarct ischemia in apical inferolateral and apical lateral regions. This is a high risk study. Overall left ventricular systolic function was abnormal. LV cavity size is severely enlarged. Nuclear stress EF: 29%. The left ventricular ejection fraction is severely decreased (<30%). There is no prior study for comparison.  Echo 2/17 EF 35-40%, mod diff HK, inf-septal, inf-lat AK, Gr 1 DD, mild MR  Carotid US 2/17 B/L 1-39% ICA   ASSESSMENT:     1. Ischemic cardiomyopathy   2. Coronary artery disease involving native coronary artery of  native heart without angina pectoris   3. Chronic combined systolic and diastolic CHF (congestive heart failure) ([place])   4. Hypertensive heart disease with heart failure ([place])   5. HLD (hyperlipidemia)   6. Smoker   7. 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 Ischemi CM - EF ~ 40%.  His BP is soft.  Will continue current dose of beta-blocker, angiotensin receptor blocker.  If BP increases, consider changing Toprol to Coreg and adding Spironolactone at some point.  Consider repeat Echo at some point in the next 90 days as well.    2. CAD - Diffuse small vessel disease by recent LHC. Decision was made initially for medical Rx.  He was not felt to have significant symptoms to proceed with PCI.  If anginal symptoms progress, PCI of a long diffusely disease RI can be considered.  FFR of the LAD could also be considered.  Currently, not having any anginal symptoms.    -  Continue ASA, Plavix, beta blocker, statin.      3. Combined Systolic and Diastolic CHF - Volume appears stable.  He is NYHA 2.  Continue Losartan 25 mg QD, Toprol-XL 25 mg QD.      4. HTN - Controlled.     5. HL - With widespread vascular disease and significant CAD, would increase statin Rx to mod intensity.  Increase Lipitor to 40 mg QD.  Check Lipids and LFTs in 6 weeks.    6. Tobacco abuse - He has quit.    7. PAD - s/p R Fem-Tib bypass.  Now, s/p L subclavian artery stenting.  FU with VVS as planned.     Medication Adjustments/Labs and Tests Ordered: Current medicines are reviewed at length with the patient today.  Concerns regarding medicines are outlined above.  Medication changes, Labs and Tests ordered today are outlined in the Patient Instructions noted below. Patient Instructions  Medication Instructions:  1. INCREASE LIPITOR TO 40 MG DAILY' NEW RX SENT  2. YOU WILL NEED TO REMAIN ON PLAVIX FOR YOUR  HEART  Labwork: 1. FASTING LIPID AND LIVER PANEL TO BE DONE IN ABOUT 6 WEEKS  Testing/Procedures: NONE  Follow-Up: DR. Johnsie Cancel 6-8 WEEKS  Any Other Special Instructions Will Be Listed Below (If Applicable).  If you need a refill on your cardiac medications before your next appointment, please call your pharmacy.    Signed, Richardson Dopp, PA-C  07/14/2015 10:48 AM    [facility] [address], [place], [place]  [address] Phone: [phone]; Fax: [phone]

## 2015-07-14 ENCOUNTER — Encounter: Payer: Self-pay | Admitting: Physician Assistant

## 2015-07-14 ENCOUNTER — Ambulatory Visit (INDEPENDENT_AMBULATORY_CARE_PROVIDER_SITE_OTHER): Payer: BLUE CROSS/BLUE SHIELD | Admitting: Physician Assistant

## 2015-07-14 VITALS — BP 100/58 | HR 74 | Ht 71.0 in | Wt 190.0 lb

## 2015-07-14 DIAGNOSIS — I5042 Chronic combined systolic (congestive) and diastolic (congestive) heart failure: Secondary | ICD-10-CM

## 2015-07-14 DIAGNOSIS — Z720 Tobacco use: Secondary | ICD-10-CM

## 2015-07-14 DIAGNOSIS — I2510 Atherosclerotic heart disease of native coronary artery without angina pectoris: Secondary | ICD-10-CM

## 2015-07-14 DIAGNOSIS — I119 Hypertensive heart disease without heart failure: Secondary | ICD-10-CM | POA: Insufficient documentation

## 2015-07-14 DIAGNOSIS — F17200 Nicotine dependence, unspecified, uncomplicated: Secondary | ICD-10-CM

## 2015-07-14 DIAGNOSIS — I70229 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, unspecified extremity: Secondary | ICD-10-CM

## 2015-07-14 DIAGNOSIS — E785 Hyperlipidemia, unspecified: Secondary | ICD-10-CM

## 2015-07-14 DIAGNOSIS — I110 Hypertensive heart disease with heart failure: Secondary | ICD-10-CM | POA: Diagnosis not present

## 2015-07-14 DIAGNOSIS — I255 Ischemic cardiomyopathy: Secondary | ICD-10-CM

## 2015-07-14 MED ORDER — ATORVASTATIN CALCIUM 40 MG PO TABS
40.0000 mg | ORAL_TABLET | Freq: Every day | ORAL | Status: DC
Start: 2015-07-14 — End: 2016-03-27

## 2015-07-14 NOTE — Patient Instructions (Addendum)
Medication Instructions:  1. INCREASE LIPITOR TO 40 MG DAILY' NEW RX SENT  2. YOU WILL NEED TO REMAIN ON PLAVIX FOR YOUR HEART  Labwork: 1. FASTING LIPID AND LIVER PANEL TO BE DONE IN ABOUT 6 WEEKS  Testing/Procedures: NONE  Follow-Up: DR. Johnsie Cancel 6-8 WEEKS  Any Other Special Instructions Will Be Listed Below (If Applicable).  If you need a refill on your cardiac medications before your next appointment, please call your pharmacy.

## 2015-07-15 ENCOUNTER — Ambulatory Visit (INDEPENDENT_AMBULATORY_CARE_PROVIDER_SITE_OTHER): Payer: BLUE CROSS/BLUE SHIELD | Admitting: Medical

## 2015-07-15 ENCOUNTER — Telehealth: Payer: Self-pay | Admitting: Vascular Surgery

## 2015-07-15 ENCOUNTER — Encounter: Payer: Self-pay | Admitting: Medical

## 2015-07-15 VITALS — BP 110/68 | HR 81 | Wt 191.0 lb

## 2015-07-15 DIAGNOSIS — F17200 Nicotine dependence, unspecified, uncomplicated: Secondary | ICD-10-CM

## 2015-07-15 DIAGNOSIS — I5042 Chronic combined systolic (congestive) and diastolic (congestive) heart failure: Secondary | ICD-10-CM | POA: Diagnosis not present

## 2015-07-15 DIAGNOSIS — E1151 Type 2 diabetes mellitus with diabetic peripheral angiopathy without gangrene: Secondary | ICD-10-CM

## 2015-07-15 DIAGNOSIS — Z720 Tobacco use: Secondary | ICD-10-CM | POA: Diagnosis not present

## 2015-07-15 DIAGNOSIS — I255 Ischemic cardiomyopathy: Secondary | ICD-10-CM

## 2015-07-15 DIAGNOSIS — I739 Peripheral vascular disease, unspecified: Secondary | ICD-10-CM | POA: Diagnosis not present

## 2015-07-15 DIAGNOSIS — I110 Hypertensive heart disease with heart failure: Secondary | ICD-10-CM | POA: Diagnosis not present

## 2015-07-15 DIAGNOSIS — E785 Hyperlipidemia, unspecified: Secondary | ICD-10-CM | POA: Diagnosis not present

## 2015-07-15 LAB — POCT GLYCOSYLATED HEMOGLOBIN (HGB A1C): HEMOGLOBIN A1C: 9.7

## 2015-07-15 MED ORDER — LOSARTAN POTASSIUM 25 MG PO TABS: 25.0000 mg | ORAL_TABLET | Freq: Every day | ORAL | Status: DC

## 2015-07-15 MED ORDER — METFORMIN HCL 1000 MG PO TABS: 1000.0000 mg | ORAL_TABLET | Freq: Two times a day (BID) | ORAL | Status: DC

## 2015-07-15 MED ORDER — TRAMADOL HCL 50 MG PO TABS: 50.0000 mg | ORAL_TABLET | Freq: Two times a day (BID) | ORAL | Status: DC | PRN

## 2015-07-15 MED ORDER — DAPAGLIFLOZIN PROPANEDIOL 10 MG PO TABS: 10.0000 mg | ORAL_TABLET | Freq: Every day | ORAL | Status: DC

## 2015-07-15 MED ORDER — FUROSEMIDE 20 MG PO TABS: 20.0000 mg | ORAL_TABLET | Freq: Every day | ORAL | Status: DC

## 2015-07-15 MED ORDER — CLOPIDOGREL BISULFATE 75 MG PO TABS: 75.0000 mg | ORAL_TABLET | Freq: Every day | ORAL | Status: DC

## 2015-07-15 MED ORDER — ASPIRIN 81 MG PO TBEC: 81.0000 mg | DELAYED_RELEASE_TABLET | Freq: Every day | ORAL | Status: DC

## 2015-07-15 MED ORDER — METOPROLOL SUCCINATE ER 25 MG PO TB24: 25.0000 mg | ORAL_TABLET | Freq: Every day | ORAL | Status: DC

## 2015-07-15 NOTE — Patient Instructions (Addendum)
Review of Medications:  Diabetes  Your diabetes marker is not at goal  Take Glipizide 10mg  twice daily for diabetes until you run out then STOP this.  Take Metformin/Glucophage 1000 mg twice daily for diabetes  ADD Farxiga 10mg  daily in the morning.  Use the samples of the 5mg  tablets until you run out, then change to 10mg  samples until you run out, then use the prescription  High Cholesterol  Take Atorvastatin/Lipitor 40mg  daily at bedtime for cholesterol.   This was increased yesterday by the heart doctor.  (you can take 4 of the 10mg  tablets daily until you run out, then switch to the new 40mg  tablet)  Heart disease  Take Aspirin 81mg  daily at bedtime  Take Plavix/Clopidogrel 75mg  daily to help prevent platelet clumping  High Blood Pressure  Take Metoprolol/Toprol XL 25mg  daily in the morning  Take Losartan 25mg  daily in the morning.  (you have a bottle of Losartan 50mg  tablets.  You can cut these in half to use 1/2 tablet daily in place of the 25mg  tablets until you run out of these)  Take Lasix/Furosemide 20mg  daily for heart and swelling, taken in the morning  Neuropathy and Pain  Increase Lyrica to 150mg  twice daily for pain.    Thus, take your 100mg  tablets twice daily until you run out.   Add the samples of 50mg  twice daily  Let me know within 2 weeks if the higher doses is helping the pain  As needed medications:  Use Ultram for mild to moderate pain as labeled, 1 tablet every 6 hours as needed  Percocet for moderate to sever pain as labeled  don't take percocet and ultram simultaneously  Wound care:  continue to use the Santyl cream on the right foot wounds  Use mild soap and water for cleaning   For now,DONT use the following: Coreg 3.125mg  twice daily.  This is a duplicate. Once you finish the Oxycodone, then this will be discontinued

## 2015-07-15 NOTE — Telephone Encounter (Signed)
I called patient because I received a short term disability form via fax.  Patient needs to sign authorization and pay $29 in order for me to complete his form.  Left patient a message to call me.  Jackelyn Poling

## 2015-07-15 NOTE — Progress Notes (Signed)
Subjective Chief Complaint  Patient presents with  . discuss medications    states he just keeps getting more medications and some he cant have refills for and he is just confused. also has letter from [employer] about certain medicatioins be 90 day supply    Here to discussed medications . He has all of his pill bottles today and in addition to medication he is suppose to be taking, he has a bottle of Coreg and bottle of Losartan 50mg .     He is not checking glucose.    He is feeling overall better since last visit with ischemic finger.  He has been back to vascular surgery for other treatment since last visit here.     His main issue today is medication reconciliation.   Past Medical History  Diagnosis Date  . Diabetes mellitus   . Hyperlipidemia   . Carotid stenosis     a. Carotid US 2/17 - bilat ICA 1-39%  . Peripheral vascular disease due to secondary diabetes mellitus ([place])     a. s/p R Fem-Tibial bypass 2/17  . Cardiomyopathy ([place])     a. Echo 2/17 - EF 35-40%, mod diff HK, inf-septal, inf-lat AK, Gr 1 DD, mild MR  . Chronic combined systolic and diastolic CHF (congestive heart failure) ([place])   . History of nuclear stress test     a. Nuc study 2/17 - Myocardial perfusion is abnormal. Findings consistent with prior myocardial infarction (inf, inf-lat, apical inf, apical lateral) with peri-infarct ischemia in apical inferolateral and apical lateral regions. This is a high risk study. Overall left ventricular systolic function was abnormal. LV cavity size is severely enlarged. Nuclear stress EF: 29%.    Past Surgical History  Procedure Laterality Date  . No history of surgery     . Peripheral vascular catheterization N/A 06/01/2015    Procedure: Abdominal Aortogram;  Surgeon: Serafina Mitchell, MD;  Location: [facility];  Service: Cardiovascular;  Laterality: N/A;  . Femoral-tibial bypass graft Right 06/02/2015    Procedure: RIGHT PROFUNDA FEMORIS TO POSTERIOR TIBIAL  ARTERY BYPASS USING RIGHT NON-REVERSED GREATER SAPHENOUS VEIN,  SUBCUTANEOUS TUNNEL;  Surgeon: Mal Misty, MD;  Location: [facility];  Service: Vascular;  Laterality: Right;  . Vein harvest Right 06/02/2015    Procedure: VEIN HARVEST RIGHT GREATER SAPHENOUS VEIN;  Surgeon: Mal Misty, MD;  Location: [facility];  Service: Vascular;  Laterality: Right;  . Endarterectomy femoral Right 06/02/2015    Procedure: ENDARTERECTOMY RIGHT PROFUNDA FEMORIS ARTERY;  Surgeon: Mal Misty, MD;  Location: [facility];  Service: Vascular;  Laterality: Right;  . Intraoperative arteriogram Right 06/02/2015    Procedure: INTRA OPERATIVE ARTERIOGRAM RIGHT LOWER LEG;  Surgeon: Mal Misty, MD;  Location: [facility];  Service: Vascular;  Laterality: Right;  . Cardiac catheterization N/A 06/30/2015    Procedure: Left Heart Cath and Coronary Angiography;  Surgeon: Jettie Booze, MD;  Location: [facility];  Service: Cardiovascular;  Laterality: N/A;  . Peripheral vascular catheterization N/A 07/07/2015    Procedure: Aortic Arch Angiography;  Surgeon: Conrad Leeds, MD;  Location: [facility];  Service: Cardiovascular;  Laterality: N/A;  . Peripheral vascular catheterization N/A 07/07/2015    Procedure: Upper Extremity Angiography;  Surgeon: Conrad [place], MD;  Location: [facility];  Service: Cardiovascular;  Laterality: N/A;  . Peripheral vascular catheterization Left 07/07/2015    Procedure: Peripheral Vascular Intervention;  Surgeon: Conrad [place], MD;  Location:  [facility];  Service: Cardiovascular;  Laterality: Left;  left subclavian and axillary   ROS as in subjective   Objective: BP 110/68 mmHg  Pulse 81  Wt 191 lb (86.637 kg)  Gen:wd, wn, nad Otherwise not examined    Assessment: Encounter Diagnoses  Name Primary?  . DM (diabetes mellitus), type 2 with peripheral vascular complications ([facility]) Yes  . Ischemic cardiomyopathy   . Chronic combined systolic and diastolic CHF (congestive  heart failure) ([place])   . Smoker   . Hypertensive heart disease with heart failure ([place])   . HLD (hyperlipidemia)   . PAD (peripheral artery disease) ([facility])     Plan: Spent 45 minutes reviewed records, medications, typing up clarification for him.  There were obvious discrepancies noted today.  HgbA1C not at goal.     Stopped glipizide today, added [place].   Increased Lyrica today Advised he not take Coreg as this is a duplicate He can 1/2 the Losartan 50mg  currently  Gave instructions as below and discussed in depth Samples of Lyrica and Farxiga given.  Patient Instructions  Review of Medications:  Diabetes  Your diabetes marker is not at goal  Take Glipizide 10mg  twice daily for diabetes until you run out then STOP this.  Take Metformin/Glucophage 1000 mg twice daily for diabetes  ADD Farxiga 10mg  daily in the morning.  Use the samples of the 5mg  tablets until you run out, then change to 10mg  samples until you run out, then use the prescription  High Cholesterol  Take Atorvastatin/Lipitor 40mg  daily at bedtime for cholesterol.   This was increased yesterday by the heart doctor.  (you can take 4 of the 10mg  tablets daily until you run out, then switch to the new 40mg  tablet)  Heart disease  Take Aspirin 81mg  daily at bedtime  Take Plavix/Clopidogrel 75mg  daily to help prevent platelet clumping  High Blood Pressure  Take Metoprolol/Toprol XL 25mg  daily in the morning  Take Losartan 25mg  daily in the morning.  (you have a bottle of Losartan 50mg  tablets.  You can cut these in half to use 1/2 tablet daily in place of the 25mg  tablets until you run out of these)  Take Lasix/Furosemide 20mg  daily for heart and swelling, taken in the morning  Neuropathy and Pain  Increase Lyrica to 150mg  twice daily for pain.    Thus, take your 100mg  tablets twice daily until you run out.   Add the samples of 50mg  twice daily  Let me know within 2 weeks if the higher doses is  helping the pain  As needed medications:  Use Ultram for mild to moderate pain as labeled, 1 tablet every 6 hours as needed  Percocet for moderate to sever pain as labeled  don't take percocet and ultram simultaneously  Wound care:  continue to use the Santyl cream on the right foot wounds  Use mild soap and water for cleaning   For now,DONT use the following: Coreg 3.125mg  twice daily.  This is a duplicate. Once you finish the Oxycodone, then this will be discontinued   f/u 34mo  Spent > 30 minutes face to face with patient in discussion of symptoms, evaluation, plan and recommendations.

## 2015-07-19 ENCOUNTER — Encounter: Payer: Self-pay | Admitting: Vascular Surgery

## 2015-07-26 ENCOUNTER — Encounter: Payer: Self-pay | Admitting: Vascular Surgery

## 2015-07-26 ENCOUNTER — Ambulatory Visit (INDEPENDENT_AMBULATORY_CARE_PROVIDER_SITE_OTHER): Payer: Self-pay | Admitting: Vascular Surgery

## 2015-07-26 VITALS — BP 131/67 | HR 70 | Temp 97.9°F | Ht 71.0 in | Wt 193.4 lb

## 2015-07-26 DIAGNOSIS — I739 Peripheral vascular disease, unspecified: Secondary | ICD-10-CM

## 2015-07-26 NOTE — Progress Notes (Signed)
Subjective:     Patient ID: Donald Steele, male   DOB: 22-Oct-1960, 55 y.o.   MRN: [id number]  HPI this 55 year old male returns for continued follow-up regarding his right lower extremity bypass which I performed 06/02/2015 4 ischemic ulcer right foot. He also had an ischemic left fifth digit and required stenting of his left subclavian/axillary artery by Dr. Bridgett Larsson a few weeks ago. Patient states a finger feels much better. He also states that the right foot feels much better but he continues to have the ulceration on the dorsum of the foot has not been infected.     Review of Systems     Objective:   Physical Exam BP 131/67 mmHg  Pulse 70  Temp(Src) 97.9 F (36.6 C) (Oral)  Ht 5\' 11"  (1.803 m)  Wt 193 lb 6.4 oz (87.726 kg)  BMI 26.99 kg/m2  SpO2 100%   Gen. Well-developed well-nourished male no apparent distress alert and oriented 3  both upper extremities with 3+ brachial and radial pulses palpable. Left fifth digit has dry gangrene with no evidence of infection. This appears that it may slough in the future without requiring formal digit amputation Right leg has well-healed surgical sites with 3+ pulse in graft and subcutaneous position medially at the knee level. Runoff is through the posterior tibial artery. Dorsum of foot has dry eschar measuring about 3 cm in diameter which is not infected     Assessment:      doing well post right profunda femoris to posterior tibial bypass performed February 16 and stenting of left subclavian artery for ischemic left fifth digit     Plan:      #1 we'll refer to wound center to expedite healing of ulcer dorsum right foot #2 return in 3 months for duplex scan of right lower extremity bypass and left subclavian artery where stenting was performed and check ABIs and see Dr. Servando Snare

## 2015-07-27 ENCOUNTER — Other Ambulatory Visit: Payer: Self-pay

## 2015-07-27 MED ORDER — PREGABALIN 150 MG PO CAPS: 150.0000 mg | ORAL_CAPSULE | Freq: Two times a day (BID) | ORAL | Status: DC

## 2015-07-27 NOTE — Telephone Encounter (Signed)
Phoned in.

## 2015-07-27 NOTE — Telephone Encounter (Signed)
Donald Steele pt, wanted refill on lyrica 150mg . Donald Steele gave him samples at last visit and now pt wants a 90 day supply sent to pharmacy. Is this ok to fill?

## 2015-07-28 ENCOUNTER — Telehealth: Payer: Self-pay | Admitting: Medical

## 2015-07-28 NOTE — Telephone Encounter (Signed)
Pt came in and dropped off a letter from [employer] concerning his short term disability. He was unsure of what is was or what they needed. I call the rep, Hilda Blades richard with [employer], and left a message for her to return my call. I need to speak to her to find out exactly what is needed.

## 2015-08-01 DIAGNOSIS — Z0279 Encounter for issue of other medical certificate: Secondary | ICD-10-CM

## 2015-08-01 NOTE — Telephone Encounter (Signed)
Talked to Audelia Acton this am and pt needs to contact vascular and vein for info concerning short term disability. Left message for pt to call informing him of that.

## 2015-08-01 NOTE — Telephone Encounter (Signed)
Pt called back and informed him of this message and that he needed to contact vascular and vein

## 2015-08-03 ENCOUNTER — Telehealth: Payer: Self-pay | Admitting: Vascular Surgery

## 2015-08-03 NOTE — Telephone Encounter (Signed)
Spoke with pt's mother. Gave her wound care appointment information: 08/09/15 8am with [facility]. She verbalized understanding.

## 2015-08-09 ENCOUNTER — Other Ambulatory Visit: Payer: Self-pay | Admitting: Surgery

## 2015-08-09 ENCOUNTER — Encounter (HOSPITAL_BASED_OUTPATIENT_CLINIC_OR_DEPARTMENT_OTHER): Payer: BLUE CROSS/BLUE SHIELD | Attending: Surgery

## 2015-08-09 ENCOUNTER — Ambulatory Visit (HOSPITAL_COMMUNITY)
Admission: RE | Admit: 2015-08-09 | Discharge: 2015-08-09 | Disposition: A | Discharge status: Home / Self Care | Payer: BLUE CROSS/BLUE SHIELD | Source: Ambulatory Visit | Attending: Surgery | Admitting: Surgery

## 2015-08-09 DIAGNOSIS — L97821 Non-pressure chronic ulcer of other part of left lower leg limited to breakdown of skin: Secondary | ICD-10-CM | POA: Insufficient documentation

## 2015-08-09 DIAGNOSIS — M869 Osteomyelitis, unspecified: Secondary | ICD-10-CM

## 2015-08-09 DIAGNOSIS — L97511 Non-pressure chronic ulcer of other part of right foot limited to breakdown of skin: Secondary | ICD-10-CM | POA: Diagnosis not present

## 2015-08-09 DIAGNOSIS — I70242 Atherosclerosis of native arteries of left leg with ulceration of calf: Secondary | ICD-10-CM | POA: Diagnosis not present

## 2015-08-09 DIAGNOSIS — E11621 Type 2 diabetes mellitus with foot ulcer: Secondary | ICD-10-CM | POA: Insufficient documentation

## 2015-08-09 DIAGNOSIS — I509 Heart failure, unspecified: Secondary | ICD-10-CM | POA: Insufficient documentation

## 2015-08-09 DIAGNOSIS — Z87891 Personal history of nicotine dependence: Secondary | ICD-10-CM | POA: Diagnosis not present

## 2015-08-09 DIAGNOSIS — E785 Hyperlipidemia, unspecified: Secondary | ICD-10-CM | POA: Diagnosis not present

## 2015-08-09 DIAGNOSIS — E1151 Type 2 diabetes mellitus with diabetic peripheral angiopathy without gangrene: Secondary | ICD-10-CM | POA: Diagnosis not present

## 2015-08-09 DIAGNOSIS — L97519 Non-pressure chronic ulcer of other part of right foot with unspecified severity: Secondary | ICD-10-CM | POA: Diagnosis not present

## 2015-08-09 DIAGNOSIS — Z95820 Peripheral vascular angioplasty status with implants and grafts: Secondary | ICD-10-CM | POA: Insufficient documentation

## 2015-08-16 ENCOUNTER — Other Ambulatory Visit: Payer: Self-pay | Admitting: Surgery

## 2015-08-16 ENCOUNTER — Ambulatory Visit (HOSPITAL_COMMUNITY)
Admission: RE | Admit: 2015-08-16 | Discharge: 2015-08-16 | Disposition: A | Discharge status: Home / Self Care | Payer: BLUE CROSS/BLUE SHIELD | Source: Ambulatory Visit | Attending: Surgery | Admitting: Surgery

## 2015-08-16 ENCOUNTER — Encounter (HOSPITAL_BASED_OUTPATIENT_CLINIC_OR_DEPARTMENT_OTHER): Payer: BLUE CROSS/BLUE SHIELD | Attending: Surgery

## 2015-08-16 DIAGNOSIS — I429 Cardiomyopathy, unspecified: Secondary | ICD-10-CM | POA: Insufficient documentation

## 2015-08-16 DIAGNOSIS — L97821 Non-pressure chronic ulcer of other part of left lower leg limited to breakdown of skin: Secondary | ICD-10-CM | POA: Diagnosis not present

## 2015-08-16 DIAGNOSIS — E1151 Type 2 diabetes mellitus with diabetic peripheral angiopathy without gangrene: Secondary | ICD-10-CM | POA: Insufficient documentation

## 2015-08-16 DIAGNOSIS — L97511 Non-pressure chronic ulcer of other part of right foot limited to breakdown of skin: Secondary | ICD-10-CM | POA: Insufficient documentation

## 2015-08-16 DIAGNOSIS — E11621 Type 2 diabetes mellitus with foot ulcer: Secondary | ICD-10-CM | POA: Diagnosis present

## 2015-08-16 DIAGNOSIS — M869 Osteomyelitis, unspecified: Secondary | ICD-10-CM

## 2015-08-17 ENCOUNTER — Encounter: Payer: Self-pay | Admitting: *Deleted

## 2015-08-18 ENCOUNTER — Telehealth: Payer: Self-pay | Admitting: Medical

## 2015-08-18 NOTE — Telephone Encounter (Signed)
Recvd office notes, labs from [facility]

## 2015-08-19 ENCOUNTER — Telehealth: Payer: Self-pay | Admitting: Vascular Surgery

## 2015-08-19 ENCOUNTER — Encounter: Payer: Self-pay | Admitting: Vascular Surgery

## 2015-08-19 NOTE — Telephone Encounter (Signed)
A user error has taken place: erroneous encounter.

## 2015-08-19 NOTE — Telephone Encounter (Signed)
On 08/18/15, Donald Steele came into the office to follow up on information that [facility] ([place] needed to clear the patient to return to work.  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  He was not clear on what was needed.  I offered to call Dr. Ouida Sills with [employer] [facility] to see what was needed.  The patient signed a medical records release authorizing me to give necessary medical information to Dr. Ouida Sills.  I spoke with Dr. Ouida Sills and, after discussion, it was determined that the heart cath information was needed to continue the clearance for Mr. [name] to drive.  I called Mr. Ralpheal to explain this to him and to ask if we could release his heart cath information.  He agreed.  On 5/5, our HIM department faxed the release of records to [facility] to release the heart cath.

## 2015-08-22 ENCOUNTER — Other Ambulatory Visit: Payer: BLUE CROSS/BLUE SHIELD

## 2015-08-23 DIAGNOSIS — L97821 Non-pressure chronic ulcer of other part of left lower leg limited to breakdown of skin: Secondary | ICD-10-CM | POA: Diagnosis not present

## 2015-08-30 DIAGNOSIS — L97821 Non-pressure chronic ulcer of other part of left lower leg limited to breakdown of skin: Secondary | ICD-10-CM | POA: Diagnosis not present

## 2015-09-06 DIAGNOSIS — L97821 Non-pressure chronic ulcer of other part of left lower leg limited to breakdown of skin: Secondary | ICD-10-CM | POA: Diagnosis not present

## 2015-09-07 NOTE — Progress Notes (Signed)
Patient ID: Donald Steele, male   DOB: 1960-09-29, 55 y.o.   MRN: [id number]   Cardiology Office Note:    Date:  09/08/2015   ID:  Donald Steele, DOB [birth year], MRN [id number]  PCP:  Crisoforo Oxford, PA-C  Cardiologist:  Dr. Jenkins Rouge   Electrophysiologist:  n/a  Chief Complaint  Patient presents with  . Cardiomyopathy    no sx    History of Present Illness:     Donald Steele is a 55 y.o. male with a hx of DM2, HL, PAD, tobacco abuse.  He was admitted 2/17 with symptoms of RLE limb threatening ischemia with ulcer formation and rest pain.  PV study demonstrated high grade porfunda femoral stenosis, occluded R SFA and severe tibial disease.  He underwent R profunda femoral to posterior tibial artery bypass by Dr. Kellie Simmering.  He was followed by Cardiology for surgical clearance. ECG was abnormal and Echo was obtained.  This demonstrated DCM with EF 35-40%.   He had no ischemic symptoms. He was started on angiotensin receptor blocker, beta-blocker.  OP Nuc Stress was recommended.  OP nuc stress test was high risk with infarct + peri-infarct ischemia.  When I saw him in FU 06/17/15, he was not on ARB or beta-blocker.  I put him on low dose Losartan and low dose Toprol-XL.    LHC 06/30/15 demonstrated small, diffusely diseased coronary vessels and no high risk lesions.  If the patient has continued angina, PCI of the RI could be considered.  This is a long diffusely diseased vessel.  The mid LAD could also be studied with FFR.  He was then brought into the hospital by Dr. Bridgett Larsson with an ischemic left fifth finger probably secondary to left subclavian stenosis. Angiogram on 07/07/15 demonstrated greater than 90% stenosis in the left subclavian artery which was treated with angioplasty and stenting.  Returns for FU.  Overall, he is doing well. Denies chest discomfort. Denies significant dyspnea. He's not been that active since his most recent procedure. Denies without new, PND or edema. Denies  syncope.   Past Medical History  Diagnosis Date  . Diabetes mellitus   . Hyperlipidemia   . Carotid stenosis     a. Carotid US 2/17 - bilat ICA 1-39%  . Peripheral vascular disease due to secondary diabetes mellitus ([place])     a. s/p R Fem-Tibial bypass 2/17  . Cardiomyopathy ([place])     a. Echo 2/17 - EF 35-40%, mod diff HK, inf-septal, inf-lat AK, Gr 1 DD, mild MR  . Chronic combined systolic and diastolic CHF (congestive heart failure) ([place])   . History of nuclear stress test     a. Nuc study 2/17 - Myocardial perfusion is abnormal. Findings consistent with prior myocardial infarction (inf, inf-lat, apical inf, apical lateral) with peri-infarct ischemia in apical inferolateral and apical lateral regions. This is a high risk study. Overall left ventricular systolic function was abnormal. LV cavity size is severely enlarged. Nuclear stress EF: 29%.     Past Surgical History  Procedure Laterality Date  . No history of surgery     . Peripheral vascular catheterization N/A 06/01/2015    Procedure: Abdominal Aortogram;  Surgeon: Serafina Mitchell, MD;  Location: [place] [facility];  Service: Cardiovascular;  Laterality: N/A;  . Femoral-tibial bypass graft Right 06/02/2015    Procedure: RIGHT PROFUNDA FEMORIS TO POSTERIOR TIBIAL ARTERY BYPASS USING RIGHT NON-REVERSED GREATER SAPHENOUS VEIN,  SUBCUTANEOUS TUNNEL;  Surgeon: Mal Misty, MD;  Location: [place];  Service: Vascular;  Laterality: Right;  . Vein harvest Right 06/02/2015    Procedure: VEIN HARVEST RIGHT GREATER SAPHENOUS VEIN;  Surgeon: Mal Misty, MD;  Location: [place];  Service: Vascular;  Laterality: Right;  . Endarterectomy femoral Right 06/02/2015    Procedure: ENDARTERECTOMY RIGHT PROFUNDA FEMORIS ARTERY;  Surgeon: Mal Misty, MD;  Location: [place];  Service: Vascular;  Laterality: Right;  . Intraoperative arteriogram Right 06/02/2015    Procedure: INTRA OPERATIVE ARTERIOGRAM RIGHT LOWER LEG;  Surgeon: Mal Misty, MD;   Location: [place];  Service: Vascular;  Laterality: Right;  . Cardiac catheterization N/A 06/30/2015    Procedure: Left Heart Cath and Coronary Angiography;  Surgeon: Jettie Booze, MD;  Location: [place] [facility];  Service: Cardiovascular;  Laterality: N/A;  . Peripheral vascular catheterization N/A 07/07/2015    Procedure: Aortic Arch Angiography;  Surgeon: Conrad Mustang, MD;  Location: [place] [facility];  Service: Cardiovascular;  Laterality: N/A;  . Peripheral vascular catheterization N/A 07/07/2015    Procedure: Upper Extremity Angiography;  Surgeon: Conrad Klukwan, MD;  Location: [place] [facility];  Service: Cardiovascular;  Laterality: N/A;  . Peripheral vascular catheterization Left 07/07/2015    Procedure: Peripheral Vascular Intervention;  Surgeon: Conrad Harrodsburg, MD;  Location: [place] [facility];  Service: Cardiovascular;  Laterality: Left;  left subclavian and axillary    Current Medications: Outpatient Prescriptions Prior to Visit  Medication Sig Dispense Refill  . aspirin 81 MG EC tablet Take 1 tablet (81 mg total) by mouth daily. Swallow whole. 90 tablet 3  . atorvastatin (LIPITOR) 40 MG tablet Take 1 tablet (40 mg total) by mouth daily. 90 tablet 3  . clopidogrel (PLAVIX) 75 MG tablet Take 1 tablet (75 mg total) by mouth daily. 90 tablet 3  . collagenase (SANTYL) ointment Apply topically daily. Apply to right foot wound daily. Place moist saline gauze on top and wrap foot daily. 15 g 0  . dapagliflozin propanediol (FARXIGA) 10 MG TABS tablet Take 10 mg by mouth daily. 90 tablet 0  . furosemide (LASIX) 20 MG tablet Take 1 tablet (20 mg total) by mouth daily. 90 tablet 3  . losartan (COZAAR) 25 MG tablet Take 1 tablet (25 mg total) by mouth daily. 90 tablet 3  . metFORMIN (GLUCOPHAGE) 1000 MG tablet Take 1 tablet (1,000 mg total) by mouth 2 (two) times daily with a meal. 180 tablet 3  . metoprolol succinate (TOPROL XL) 25 MG 24 hr tablet Take 1 tablet (25 mg total) by mouth daily.  90 tablet 3  . nitroGLYCERIN (NITROSTAT) 0.4 MG SL tablet Place 1 tablet (0.4 mg total) under the tongue every 5 (five) minutes as needed for chest pain. 25 tablet 3  . pregabalin (LYRICA) 150 MG capsule Take 1 capsule (150 mg total) by mouth 2 (two) times daily. 90 capsule 0  . traMADol (ULTRAM) 50 MG tablet Take 1 tablet (50 mg total) by mouth every 12 (twelve) hours as needed. 45 tablet 0   No facility-administered medications prior to visit.     Allergies:   Review of patient's allergies indicates no known allergies.   Social History   Social History  . Marital Status: Single    Spouse Name: N/A  . Number of Children: N/A  . Years of Education: N/A   Social History Main Topics  . Smoking status: Current Every Day Smoker -- 1.00 packs/day for 35 years  . Smokeless tobacco: Never Used  . Alcohol  Use: No  . Drug Use: No  . Sexual Activity: Not Asked   Other Topics Concern  . None   Social History Narrative     Family History:  The patient's family history is negative for Heart attack.   ROS:   Please see the history of present illness.    ROS All other systems reviewed and are negative.   Physical Exam:    VS:  BP 100/70 mmHg  Pulse 70  Ht 5\' 11"  (1.803 m)  Wt 90.175 kg (198 lb 12.8 oz)  BMI 27.74 kg/m2  SpO2 98%   GEN: Well nourished, well developed, in no acute distress HEENT: normal Neck: no JVD, no masses bilateral bruits  Cardiac: Normal S1/S2, RRR; no murmurs, no edema;     right wrist without hematoma or mass  Bilateral subclavian bruits  Respiratory:  Decreased breath sounds bilaterally; no wheezing, rhonchi or rales GI: soft, nontender  MS: no deformity or atrophy Skin: Warm and dry  Psych: Alert and oriented x 3, normal affect S/P right fem tib with ulcer on right foot   Wt Readings from Last 3 Encounters:  09/08/15 90.175 kg (198 lb 12.8 oz)  07/26/15 87.726 kg (193 lb 6.4 oz)  07/15/15 86.637 kg (191 lb)      Studies/Labs Reviewed:      ECG:   07/14/15   NSR, HR 75, normal axis, inferior Q waves, T-wave inversions in 2, 3, aVF, V4-V6, QTc 446 ms, no change from prior tracing   Recent Labs: 06/01/2015: ALT 17 06/24/2015: Platelets 380 07/07/2015: BUN 10; Creatinine, Ser 0.70; Hemoglobin 13.9; Potassium 4.5; Sodium 139   Recent Lipid Panel No results found for: CHOL, TRIG, HDL, CHOLHDL, VLDL, LDLCALC, LDLDIRECT  Additional studies/ records that were reviewed today include:   UE angiogram 07/07/15 S/p stent to L subclavian artery  LHC 06/30/15 LAD mid 50%, distal 50% RI 75% LCx ok RPDA 99% L-R collats EF 35-45% Generally small, diffusely diseased coronary vessels. No high risk lesions at this time. Given his lack of symptoms, would favor medical therapy. Would allow him to have his PV procedure performed. Hopefully, his foot ulcer and his left hand ulcer will heal. If he develops angina, could bring him back for treatment of the ramus which is a long, diffusely diseased vessel. Could also pressure wire the mid LAD.   UE Vasc US 06/24/15 L subclavian stenosis > 50%  Myoview 3/17 Overall Study Impression Myocardial perfusion is abnormal. Findings consistent with prior myocardial infarction (inf, inf-lat, apical inf, apical lateral) with peri-infarct ischemia in apical inferolateral and apical lateral regions. This is a high risk study. Overall left ventricular systolic function was abnormal. LV cavity size is severely enlarged. Nuclear stress EF: 29%. The left ventricular ejection fraction is severely decreased (<30%). There is no prior study for comparison.  Echo 2/17 EF 35-40%, mod diff HK, inf-septal, inf-lat AK, Gr 1 DD, mild MR  Carotid US 2/17 B/L 1-39% ICA   ASSESSMENT:     1. Ischemic cardiomyopathy     PLAN:     In order of problems listed above:  1. Ischemi CM - EF ~ 40%.  His BP is soft.  Will continue current dose of beta-blocker, angiotensin receptor blocker.  If BP increases, consider changing  Toprol to Coreg and adding Spironolactone at some point.  Consider repeat Echo at some point in the next 90 days as well.    2. CAD - Diffuse small vessel disease by recent LHC. Decision  was made initially for medical Rx.  He was not felt to have significant symptoms to proceed with PCI.  If anginal symptoms progress, PCI of a long diffusely disease RI can be considered.  FFR of the LAD could also be considered.  Currently, not having any anginal symptoms.    -  Continue ASA, Plavix, beta blocker, statin.      3. Combined Systolic and Diastolic CHF - Volume appears stable.  He is NYHA 2.  Continue Losartan 25 mg QD, Toprol-XL 25 mg QD.      4. HTN - Controlled.     5. HL - Lipitor 40 mg No results found for: [place]   6. Tobacco abuse - He has quit.    7. PAD - s/p R Fem-Tib bypass.  Now, s/p L subclavian artery stenting.  FU with VVS as planned.  Wound care center for ulcer on right foot    Jenkins Rouge

## 2015-09-08 ENCOUNTER — Encounter: Payer: Self-pay | Admitting: Cardiovascular Disease

## 2015-09-08 ENCOUNTER — Ambulatory Visit (INDEPENDENT_AMBULATORY_CARE_PROVIDER_SITE_OTHER): Payer: BLUE CROSS/BLUE SHIELD | Admitting: Cardiovascular Disease

## 2015-09-08 VITALS — BP 100/70 | HR 70 | Ht 71.0 in | Wt 198.8 lb

## 2015-09-08 DIAGNOSIS — I255 Ischemic cardiomyopathy: Secondary | ICD-10-CM | POA: Diagnosis not present

## 2015-09-08 NOTE — Patient Instructions (Signed)

## 2015-09-13 DIAGNOSIS — L97821 Non-pressure chronic ulcer of other part of left lower leg limited to breakdown of skin: Secondary | ICD-10-CM | POA: Diagnosis not present

## 2015-09-20 ENCOUNTER — Encounter (HOSPITAL_BASED_OUTPATIENT_CLINIC_OR_DEPARTMENT_OTHER): Payer: BLUE CROSS/BLUE SHIELD | Attending: Surgery

## 2015-09-20 DIAGNOSIS — I70235 Atherosclerosis of native arteries of right leg with ulceration of other part of foot: Secondary | ICD-10-CM | POA: Insufficient documentation

## 2015-09-20 DIAGNOSIS — L97821 Non-pressure chronic ulcer of other part of left lower leg limited to breakdown of skin: Secondary | ICD-10-CM | POA: Diagnosis not present

## 2015-09-20 DIAGNOSIS — E11621 Type 2 diabetes mellitus with foot ulcer: Secondary | ICD-10-CM | POA: Diagnosis not present

## 2015-09-20 DIAGNOSIS — E1151 Type 2 diabetes mellitus with diabetic peripheral angiopathy without gangrene: Secondary | ICD-10-CM | POA: Diagnosis not present

## 2015-09-20 DIAGNOSIS — I6529 Occlusion and stenosis of unspecified carotid artery: Secondary | ICD-10-CM | POA: Insufficient documentation

## 2015-09-20 DIAGNOSIS — E785 Hyperlipidemia, unspecified: Secondary | ICD-10-CM | POA: Diagnosis not present

## 2015-09-20 DIAGNOSIS — L97511 Non-pressure chronic ulcer of other part of right foot limited to breakdown of skin: Secondary | ICD-10-CM | POA: Diagnosis not present

## 2015-09-20 DIAGNOSIS — Z87891 Personal history of nicotine dependence: Secondary | ICD-10-CM | POA: Insufficient documentation

## 2015-09-20 DIAGNOSIS — Z9861 Coronary angioplasty status: Secondary | ICD-10-CM | POA: Diagnosis not present

## 2015-09-20 DIAGNOSIS — Z95820 Peripheral vascular angioplasty status with implants and grafts: Secondary | ICD-10-CM | POA: Diagnosis not present

## 2015-09-20 DIAGNOSIS — I429 Cardiomyopathy, unspecified: Secondary | ICD-10-CM | POA: Diagnosis not present

## 2015-09-20 DIAGNOSIS — I509 Heart failure, unspecified: Secondary | ICD-10-CM | POA: Insufficient documentation

## 2015-09-20 DIAGNOSIS — I70248 Atherosclerosis of native arteries of left leg with ulceration of other part of lower left leg: Secondary | ICD-10-CM | POA: Diagnosis not present

## 2015-09-26 ENCOUNTER — Encounter: Payer: Self-pay | Admitting: Vascular Surgery

## 2015-09-27 DIAGNOSIS — E11621 Type 2 diabetes mellitus with foot ulcer: Secondary | ICD-10-CM | POA: Diagnosis not present

## 2015-09-28 ENCOUNTER — Ambulatory Visit (HOSPITAL_COMMUNITY): Payer: BLUE CROSS/BLUE SHIELD | Attending: Vascular Surgery

## 2015-09-28 ENCOUNTER — Ambulatory Visit (HOSPITAL_COMMUNITY): Payer: BLUE CROSS/BLUE SHIELD

## 2015-10-04 ENCOUNTER — Ambulatory Visit: Payer: BLUE CROSS/BLUE SHIELD | Admitting: Vascular Surgery

## 2015-10-04 DIAGNOSIS — E11621 Type 2 diabetes mellitus with foot ulcer: Secondary | ICD-10-CM | POA: Diagnosis not present

## 2015-10-11 DIAGNOSIS — E11621 Type 2 diabetes mellitus with foot ulcer: Secondary | ICD-10-CM | POA: Diagnosis not present

## 2015-10-18 ENCOUNTER — Other Ambulatory Visit: Payer: Self-pay | Admitting: Medical

## 2015-10-19 NOTE — Telephone Encounter (Signed)
Is this ok to refill?  

## 2015-10-25 ENCOUNTER — Encounter (HOSPITAL_BASED_OUTPATIENT_CLINIC_OR_DEPARTMENT_OTHER): Payer: BLUE CROSS/BLUE SHIELD | Attending: Surgery

## 2015-10-25 DIAGNOSIS — Z95820 Peripheral vascular angioplasty status with implants and grafts: Secondary | ICD-10-CM | POA: Diagnosis not present

## 2015-10-25 DIAGNOSIS — I70235 Atherosclerosis of native arteries of right leg with ulceration of other part of foot: Secondary | ICD-10-CM | POA: Insufficient documentation

## 2015-10-25 DIAGNOSIS — E785 Hyperlipidemia, unspecified: Secondary | ICD-10-CM | POA: Diagnosis not present

## 2015-10-25 DIAGNOSIS — I429 Cardiomyopathy, unspecified: Secondary | ICD-10-CM | POA: Insufficient documentation

## 2015-10-25 DIAGNOSIS — Z87891 Personal history of nicotine dependence: Secondary | ICD-10-CM | POA: Insufficient documentation

## 2015-10-25 DIAGNOSIS — I70248 Atherosclerosis of native arteries of left leg with ulceration of other part of lower left leg: Secondary | ICD-10-CM | POA: Diagnosis not present

## 2015-10-25 DIAGNOSIS — E1151 Type 2 diabetes mellitus with diabetic peripheral angiopathy without gangrene: Secondary | ICD-10-CM | POA: Insufficient documentation

## 2015-10-25 DIAGNOSIS — L97821 Non-pressure chronic ulcer of other part of left lower leg limited to breakdown of skin: Secondary | ICD-10-CM | POA: Insufficient documentation

## 2015-10-25 DIAGNOSIS — L97511 Non-pressure chronic ulcer of other part of right foot limited to breakdown of skin: Secondary | ICD-10-CM | POA: Insufficient documentation

## 2015-10-25 DIAGNOSIS — L97521 Non-pressure chronic ulcer of other part of left foot limited to breakdown of skin: Secondary | ICD-10-CM | POA: Insufficient documentation

## 2015-10-25 DIAGNOSIS — E11621 Type 2 diabetes mellitus with foot ulcer: Secondary | ICD-10-CM | POA: Diagnosis not present

## 2015-10-26 ENCOUNTER — Telehealth: Payer: Self-pay

## 2015-10-26 NOTE — Telephone Encounter (Signed)
pls call this in with 1 refill

## 2015-10-26 NOTE — Telephone Encounter (Signed)
Pt needs refill of [facility] [address]. Request rcvd via fax.   Thank you, RLB

## 2015-10-27 MED ORDER — PREGABALIN 150 MG PO CAPS: 150.0000 mg | ORAL_CAPSULE | Freq: Two times a day (BID) | ORAL | Status: DC

## 2015-10-27 NOTE — Telephone Encounter (Signed)
Phoned in.

## 2015-11-01 ENCOUNTER — Encounter: Payer: BLUE CROSS/BLUE SHIELD | Admitting: Medical

## 2015-11-01 DIAGNOSIS — E11621 Type 2 diabetes mellitus with foot ulcer: Secondary | ICD-10-CM | POA: Diagnosis not present

## 2015-11-08 DIAGNOSIS — E11621 Type 2 diabetes mellitus with foot ulcer: Secondary | ICD-10-CM | POA: Diagnosis not present

## 2015-11-15 ENCOUNTER — Ambulatory Visit (INDEPENDENT_AMBULATORY_CARE_PROVIDER_SITE_OTHER): Payer: BLUE CROSS/BLUE SHIELD | Admitting: Medical

## 2015-11-15 ENCOUNTER — Encounter: Payer: Self-pay | Admitting: Medical

## 2015-11-15 ENCOUNTER — Encounter (HOSPITAL_BASED_OUTPATIENT_CLINIC_OR_DEPARTMENT_OTHER): Payer: BLUE CROSS/BLUE SHIELD | Attending: Surgery

## 2015-11-15 VITALS — BP 110/72 | HR 69 | Wt 179.0 lb

## 2015-11-15 DIAGNOSIS — E1151 Type 2 diabetes mellitus with diabetic peripheral angiopathy without gangrene: Secondary | ICD-10-CM

## 2015-11-15 DIAGNOSIS — I2510 Atherosclerotic heart disease of native coronary artery without angina pectoris: Secondary | ICD-10-CM

## 2015-11-15 DIAGNOSIS — E11621 Type 2 diabetes mellitus with foot ulcer: Secondary | ICD-10-CM | POA: Diagnosis not present

## 2015-11-15 DIAGNOSIS — L97511 Non-pressure chronic ulcer of other part of right foot limited to breakdown of skin: Secondary | ICD-10-CM | POA: Diagnosis not present

## 2015-11-15 DIAGNOSIS — Z23 Encounter for immunization: Secondary | ICD-10-CM | POA: Diagnosis not present

## 2015-11-15 DIAGNOSIS — I70229 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, unspecified extremity: Secondary | ICD-10-CM | POA: Diagnosis not present

## 2015-11-15 DIAGNOSIS — E1140 Type 2 diabetes mellitus with diabetic neuropathy, unspecified: Secondary | ICD-10-CM | POA: Diagnosis not present

## 2015-11-15 DIAGNOSIS — Z7189 Other specified counseling: Secondary | ICD-10-CM | POA: Insufficient documentation

## 2015-11-15 DIAGNOSIS — I739 Peripheral vascular disease, unspecified: Secondary | ICD-10-CM | POA: Diagnosis not present

## 2015-11-15 DIAGNOSIS — L97821 Non-pressure chronic ulcer of other part of left lower leg limited to breakdown of skin: Secondary | ICD-10-CM | POA: Insufficient documentation

## 2015-11-15 DIAGNOSIS — Z87891 Personal history of nicotine dependence: Secondary | ICD-10-CM | POA: Diagnosis not present

## 2015-11-15 DIAGNOSIS — I110 Hypertensive heart disease with heart failure: Secondary | ICD-10-CM | POA: Diagnosis not present

## 2015-11-15 DIAGNOSIS — L97522 Non-pressure chronic ulcer of other part of left foot with fat layer exposed: Secondary | ICD-10-CM | POA: Diagnosis not present

## 2015-11-15 DIAGNOSIS — I255 Ischemic cardiomyopathy: Secondary | ICD-10-CM | POA: Diagnosis not present

## 2015-11-15 DIAGNOSIS — Z7185 Encounter for immunization safety counseling: Secondary | ICD-10-CM

## 2015-11-15 DIAGNOSIS — E785 Hyperlipidemia, unspecified: Secondary | ICD-10-CM

## 2015-11-15 DIAGNOSIS — I509 Heart failure, unspecified: Secondary | ICD-10-CM | POA: Diagnosis not present

## 2015-11-15 DIAGNOSIS — K029 Dental caries, unspecified: Secondary | ICD-10-CM | POA: Diagnosis not present

## 2015-11-15 LAB — POCT URINALYSIS DIPSTICK
Bilirubin, UA: NEGATIVE
Blood, UA: NEGATIVE
KETONES UA: NEGATIVE
LEUKOCYTES UA: NEGATIVE
Nitrite, UA: NEGATIVE
PH UA: 6
PROTEIN UA: NEGATIVE
SPEC GRAV UA: 1.015
UROBILINOGEN UA: 0.2

## 2015-11-15 LAB — CBC WITH DIFFERENTIAL/PLATELET
BASOS PCT: 1 %
Basophils Absolute: 61 cells/uL (ref 0–200)
EOS PCT: 4 %
Eosinophils Absolute: 244 cells/uL (ref 15–500)
HCT: 40 % (ref 38.5–50.0)
HEMOGLOBIN: 12.7 g/dL — AB (ref 13.2–17.1)
LYMPHS ABS: 2318 {cells}/uL (ref 850–3900)
Lymphocytes Relative: 38 %
MCH: 28.8 pg (ref 27.0–33.0)
MCHC: 31.8 g/dL — ABNORMAL LOW (ref 32.0–36.0)
MCV: 90.7 fL (ref 80.0–100.0)
MONOS PCT: 8 %
MPV: 10.8 fL (ref 7.5–12.5)
Monocytes Absolute: 488 cells/uL (ref 200–950)
NEUTROS ABS: 2989 {cells}/uL (ref 1500–7800)
Neutrophils Relative %: 49 %
PLATELETS: 280 10*3/uL (ref 140–400)
RBC: 4.41 MIL/uL (ref 4.20–5.80)
RDW: 13.4 % (ref 11.0–15.0)
WBC: 6.1 10*3/uL (ref 4.0–10.5)

## 2015-11-15 LAB — HEMOGLOBIN A1C
Hgb A1c MFr Bld: 11.9 % — ABNORMAL HIGH (ref <5.7)
Mean Plasma Glucose: 295 mg/dL

## 2015-11-15 MED ORDER — PREGABALIN 200 MG PO CAPS: 200.0000 mg | ORAL_CAPSULE | Freq: Two times a day (BID) | ORAL | 2 refills | Status: DC

## 2015-11-15 MED ORDER — TRAMADOL HCL 50 MG PO TABS: 50.0000 mg | ORAL_TABLET | Freq: Two times a day (BID) | ORAL | 0 refills | Status: DC | PRN

## 2015-11-15 NOTE — Progress Notes (Signed)
Subjective Chief Complaint  Patient presents with  . med check    states all is going ok and he needs to talk about getting more tramadol filled. Wound care will not fill it for pt.    Here for med check.  Diabetes - not checking glucose daily.  Can't give me good idea of recent glucose numbers.  Not eating all that well due to bad taste in mouth, dental pain.   Is trying to get back in with dentist, knows he has some decay, considering dentures. .  Can't take medication without food without getting sick.   Taking [place] 10mg  daily, Metformin 1000mg  BID without c/o, but not always compliant due to bad taste in mouth and dental concern  Hyperlipidemia, PVD - Taking Lipitor 40mg  and Aspirin 81mg  daily in the morning.  PVD - Taking Plavix, has f/u on 8//22/17 with vascular surgeon  hypertensive heart disease, PVD - Taking Toprol XL 25mg , losartan 25mg , lasix 20mg    Diabetic neuropathy - Taking lyrica 150mg  BID, ultram prn.  Helps but doesn't think the lyrica is doing enough.  Particularly at night has lots of burning pains in feet.  Still seeing wound clinic weekly, wounds are healing better on both lower legs and feet, but had blister the other day on left small toe, its trying to heal.     Vaccines - Last td within 10 years    Past Medical History:  Diagnosis Date  . Cardiomyopathy ([place])    a. Echo 2/17 - EF 35-40%, mod diff HK, inf-septal, inf-lat AK, Gr 1 DD, mild MR  . Carotid stenosis    a. Carotid US 2/17 - bilat ICA 1-39%  . Chronic combined systolic and diastolic CHF (congestive heart failure) ([place])   . Diabetes mellitus   . History of nuclear stress test    a. Nuc study 2/17 - Myocardial perfusion is abnormal. Findings consistent with prior myocardial infarction (inf, inf-lat, apical inf, apical lateral) with peri-infarct ischemia in apical inferolateral and apical lateral regions. This is a high risk study. Overall left ventricular systolic function was abnormal. LV cavity  size is severely enlarged. Nuclear stress EF: 29%.   . Hyperlipidemia   . Peripheral vascular disease due to secondary diabetes mellitus ([place])    a. s/p R Fem-Tibial bypass 2/17   Past Surgical History:  Procedure Laterality Date  . CARDIAC CATHETERIZATION N/A 06/30/2015   Procedure: Left Heart Cath and Coronary Angiography;  Surgeon: Jettie Booze, MD;  Location: [place] [facility];  Service: Cardiovascular;  Laterality: N/A;  . ENDARTERECTOMY FEMORAL Right 06/02/2015   Procedure: ENDARTERECTOMY RIGHT PROFUNDA FEMORIS ARTERY;  Surgeon: Mal Misty, MD;  Location: [place];  Service: Vascular;  Laterality: Right;  . FEMORAL-TIBIAL BYPASS GRAFT Right 06/02/2015   Procedure: RIGHT PROFUNDA FEMORIS TO POSTERIOR TIBIAL ARTERY BYPASS USING RIGHT NON-REVERSED GREATER SAPHENOUS VEIN,  SUBCUTANEOUS TUNNEL;  Surgeon: Mal Misty, MD;  Location: [place];  Service: Vascular;  Laterality: Right;  . INTRAOPERATIVE ARTERIOGRAM Right 06/02/2015   Procedure: INTRA OPERATIVE ARTERIOGRAM RIGHT LOWER LEG;  Surgeon: Mal Misty, MD;  Location: [place];  Service: Vascular;  Laterality: Right;  . No history of Surgery     . PERIPHERAL VASCULAR CATHETERIZATION N/A 06/01/2015   Procedure: Abdominal Aortogram;  Surgeon: Serafina Mitchell, MD;  Location: [place] [facility];  Service: Cardiovascular;  Laterality: N/A;  . PERIPHERAL VASCULAR CATHETERIZATION N/A 07/07/2015   Procedure: Aortic Arch Angiography;  Surgeon: Conrad Maypearl, MD;  Location: [place] [facility];  Service: Cardiovascular;  Laterality: N/A;  . PERIPHERAL VASCULAR CATHETERIZATION N/A 07/07/2015   Procedure: Upper Extremity Angiography;  Surgeon: Conrad Buck Meadows, MD;  Location: [place] [facility];  Service: Cardiovascular;  Laterality: N/A;  . PERIPHERAL VASCULAR CATHETERIZATION Left 07/07/2015   Procedure: Peripheral Vascular Intervention;  Surgeon: Conrad Rebecca, MD;  Location: [place] [facility];  Service: Cardiovascular;  Laterality: Left;  left  subclavian and axillary  . VEIN HARVEST Right 06/02/2015   Procedure: VEIN HARVEST RIGHT GREATER SAPHENOUS VEIN;  Surgeon: Mal Misty, MD;  Location: [place];  Service: Vascular;  Laterality: Right;   Current Outpatient Prescriptions on File Prior to Visit  Medication Sig Dispense Refill  . aspirin 81 MG EC tablet Take 1 tablet (81 mg total) by mouth daily. Swallow whole. 90 tablet 3  . atorvastatin (LIPITOR) 40 MG tablet Take 1 tablet (40 mg total) by mouth daily. 90 tablet 3  . clopidogrel (PLAVIX) 75 MG tablet Take 1 tablet (75 mg total) by mouth daily. 90 tablet 3  . collagenase (SANTYL) ointment Apply topically daily. Apply to right foot wound daily. Place moist saline gauze on top and wrap foot daily. 15 g 0  . dapagliflozin propanediol (FARXIGA) 10 MG TABS tablet Take 10 mg by mouth daily. 90 tablet 0  . furosemide (LASIX) 20 MG tablet Take 1 tablet (20 mg total) by mouth daily. 90 tablet 3  . losartan (COZAAR) 25 MG tablet Take 1 tablet (25 mg total) by mouth daily. 90 tablet 3  . metFORMIN (GLUCOPHAGE) 1000 MG tablet Take 1 tablet (1,000 mg total) by mouth 2 (two) times daily with a meal. 180 tablet 3  . metoprolol succinate (TOPROL XL) 25 MG 24 hr tablet Take 1 tablet (25 mg total) by mouth daily. 90 tablet 3  . pregabalin (LYRICA) 150 MG capsule Take 1 capsule (150 mg total) by mouth 2 (two) times daily. 90 capsule 1  . nitroGLYCERIN (NITROSTAT) 0.4 MG SL tablet Place 1 tablet (0.4 mg total) under the tongue every 5 (five) minutes as needed for chest pain. (Patient not taking: Reported on 11/15/2015) 25 tablet 3   No current facility-administered medications on file prior to visit.      ROS as in subjective   Objective: BP 110/72   Pulse 69   Wt 179 lb (81.2 kg)   BMI 24.97 kg/m   Gen: wd, wn, nad, pleasant AA male Heart: RRR, normal s1, s2, no murmurs Lungs clear Several teeth with obvious decay, moderate plaque throughout, stain throughout, missing some molars upper  bilat Oral MMM, no other lesions HENT unremarkable otherwise [place]: +bs, soft, nontender Ext: no edema Feet with decrease but palpable pulses, bandages present on both feet so exam limited ( he sees wound clinic weekly), small healing lesion of left small toe dorsally c/w recent bullous lesions    Assessment: Encounter Diagnoses  Name Primary?  . Type 2 diabetes mellitus with diabetic neuropathy, without long-term current use of insulin ([place]) Yes  . DM (diabetes mellitus), type 2 with peripheral vascular complications ([place])   . Hypertensive heart disease with heart failure ([place])   . Coronary artery disease involving native coronary artery of native heart without angina pectoris   . 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 Ischemic cardiomyopathy   . PAD (peripheral artery disease) ([place])   . HLD (hyperlipidemia)   . Vaccine counseling   . Need  for prophylactic vaccination against Streptococcus pneumoniae (pneumococcus)   . Former smoker   . Tooth decay      Plan: discussed need to monitor glucose daily, discussed need for better compliance with medications.   advised ensure shakes as meal replacement if not able to eat.   Advised dental f/u ASAP.   Labs today.  Reviewed last vascular surgeon notes.   Advised yearly eye exam and he has seen eye doctor this year.  discussed healthy diet, getting exercise.  Glad to hear he is still tobacco free.  discussed daily foot checks.   discussed goals of diabetes care.     neuropathy - increase to Lyrica 200 mg BID, ultram prn.  Counseled on the pneumococcal vaccine.  Vaccine information sheet given.  Pneumococcal vaccine PPSV23 given after consent obtained.  Advised flu shot in September  He reportedly is UTD on tetanus.  counseled on Hep B.  We can consider starting this next visit.  C/t f/u with wound clinic.   Spent > 30 minutes face to face with patient in discussion of  symptoms, evaluation, plan and recommendations.    [place]ard was seen today for med check.  Diagnoses and all orders for this visit:  Type 2 diabetes mellitus with diabetic neuropathy, without long-term current use of insulin (HCC) -     Comprehensive metabolic panel -     CBC with Differential/Platelet -     Lipid panel -     Hemoglobin A1c -     HM DIABETES FOOT EXAM -     HM DIABETES EYE EXAM -     Microalbumin / creatinine urine ratio -     POCT urinalysis dipstick  DM (diabetes mellitus), type 2 with peripheral vascular complications (HCC) -     POCT urinalysis dipstick  Hypertensive heart disease with heart failure (HCC) -     Comprehensive metabolic panel -     POCT urinalysis dipstick  Coronary artery disease involving native coronary artery of native heart without angina pectoris  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 -     pregabalin (LYRICA) 200 MG capsule; Take 1 capsule (200 mg total) by mouth 2 (two) times daily.

## 2015-11-16 LAB — COMPREHENSIVE METABOLIC PANEL
ALBUMIN: 3.8 g/dL (ref 3.6–5.1)
ALK PHOS: 485 U/L — AB (ref 40–115)
ALT: 171 U/L — AB (ref 9–46)
AST: 143 U/L — ABNORMAL HIGH (ref 10–35)
BILIRUBIN TOTAL: 5.7 mg/dL — AB (ref 0.2–1.2)
BUN: 6 mg/dL — ABNORMAL LOW (ref 7–25)
CALCIUM: 9.5 mg/dL (ref 8.6–10.3)
CO2: 19 mmol/L — ABNORMAL LOW (ref 20–31)
Chloride: 98 mmol/L (ref 98–110)
Creat: 0.97 mg/dL (ref 0.70–1.33)
GLUCOSE: 487 mg/dL — AB (ref 65–99)
POTASSIUM: 4 mmol/L (ref 3.5–5.3)
Sodium: 133 mmol/L — ABNORMAL LOW (ref 135–146)
TOTAL PROTEIN: 6.6 g/dL (ref 6.1–8.1)

## 2015-11-16 LAB — LIPID PANEL
CHOL/HDL RATIO: 3.4 ratio (ref <=5.0)
CHOLESTEROL: 177 mg/dL (ref 125–200)
HDL: 52 mg/dL (ref >=40)
LDL Cholesterol: 93 mg/dL (ref <130)
TRIGLYCERIDES: 158 mg/dL — AB (ref <150)
VLDL: 32 mg/dL — AB (ref <30)

## 2015-11-16 LAB — MICROALBUMIN / CREATININE URINE RATIO
CREATININE, URINE: 81 mg/dL (ref 20–370)
MICROALB/CREAT RATIO: 32 ug/mg{creat} — AB (ref <30)
Microalb, Ur: 2.6 mg/dL

## 2015-11-17 ENCOUNTER — Other Ambulatory Visit: Payer: Self-pay | Admitting: Medical

## 2015-11-17 DIAGNOSIS — R945 Abnormal results of liver function studies: Principal | ICD-10-CM

## 2015-11-17 DIAGNOSIS — R7989 Other specified abnormal findings of blood chemistry: Secondary | ICD-10-CM

## 2015-11-17 MED ORDER — "NEEDLE (DISP) 30G X 1/2"" MISC": 1.0000 | Freq: Every day | 3 refills | Status: DC

## 2015-11-17 MED ORDER — DAPAGLIFLOZIN PROPANEDIOL 10 MG PO TABS: 10.0000 mg | ORAL_TABLET | Freq: Every day | ORAL | 1 refills | Status: DC

## 2015-11-17 MED ORDER — INSULIN DEGLUDEC 100 UNIT/ML ~~LOC~~ SOPN: 15.0000 [IU] | PEN_INJECTOR | Freq: Every day | SUBCUTANEOUS | 3 refills | Status: DC

## 2015-11-22 ENCOUNTER — Ambulatory Visit (HOSPITAL_COMMUNITY)
Admission: RE | Admit: 2015-11-22 | Discharge: 2015-11-22 | Disposition: A | Discharge status: Home / Self Care | Payer: BLUE CROSS/BLUE SHIELD | Source: Ambulatory Visit | Attending: General Surgery | Admitting: General Surgery

## 2015-11-22 ENCOUNTER — Other Ambulatory Visit (HOSPITAL_BASED_OUTPATIENT_CLINIC_OR_DEPARTMENT_OTHER): Payer: Self-pay | Admitting: General Surgery

## 2015-11-22 ENCOUNTER — Telehealth: Payer: Self-pay | Admitting: Medical

## 2015-11-22 DIAGNOSIS — L97529 Non-pressure chronic ulcer of other part of left foot with unspecified severity: Secondary | ICD-10-CM | POA: Insufficient documentation

## 2015-11-22 DIAGNOSIS — M869 Osteomyelitis, unspecified: Secondary | ICD-10-CM

## 2015-11-22 DIAGNOSIS — M19072 Primary osteoarthritis, left ankle and foot: Secondary | ICD-10-CM | POA: Diagnosis not present

## 2015-11-22 DIAGNOSIS — E11621 Type 2 diabetes mellitus with foot ulcer: Secondary | ICD-10-CM | POA: Insufficient documentation

## 2015-11-22 NOTE — Telephone Encounter (Signed)
P.A. BD NEEDLES,  [facility].  No need for P.A.

## 2015-11-29 ENCOUNTER — Telehealth: Payer: Self-pay | Admitting: Internal Medicine

## 2015-11-29 ENCOUNTER — Ambulatory Visit: Payer: BLUE CROSS/BLUE SHIELD | Admitting: Family Medicine

## 2015-11-29 ENCOUNTER — Ambulatory Visit (INDEPENDENT_AMBULATORY_CARE_PROVIDER_SITE_OTHER): Payer: BLUE CROSS/BLUE SHIELD | Admitting: Medical

## 2015-11-29 ENCOUNTER — Other Ambulatory Visit: Payer: Self-pay | Admitting: Medical

## 2015-11-29 VITALS — BP 112/80 | HR 83 | Wt 169.0 lb

## 2015-11-29 DIAGNOSIS — E1140 Type 2 diabetes mellitus with diabetic neuropathy, unspecified: Secondary | ICD-10-CM | POA: Diagnosis not present

## 2015-11-29 DIAGNOSIS — I70229 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, unspecified extremity: Secondary | ICD-10-CM | POA: Diagnosis not present

## 2015-11-29 DIAGNOSIS — I739 Peripheral vascular disease, unspecified: Secondary | ICD-10-CM | POA: Diagnosis not present

## 2015-11-29 DIAGNOSIS — R7989 Other specified abnormal findings of blood chemistry: Secondary | ICD-10-CM

## 2015-11-29 DIAGNOSIS — D649 Anemia, unspecified: Secondary | ICD-10-CM | POA: Diagnosis not present

## 2015-11-29 DIAGNOSIS — E11621 Type 2 diabetes mellitus with foot ulcer: Secondary | ICD-10-CM | POA: Diagnosis not present

## 2015-11-29 DIAGNOSIS — R945 Abnormal results of liver function studies: Principal | ICD-10-CM

## 2015-11-29 DIAGNOSIS — R634 Abnormal weight loss: Secondary | ICD-10-CM

## 2015-11-29 DIAGNOSIS — Z87891 Personal history of nicotine dependence: Secondary | ICD-10-CM

## 2015-11-29 DIAGNOSIS — E1151 Type 2 diabetes mellitus with diabetic peripheral angiopathy without gangrene: Secondary | ICD-10-CM | POA: Diagnosis not present

## 2015-11-29 LAB — FERRITIN: FERRITIN: 645 ng/mL — AB (ref 20–380)

## 2015-11-29 LAB — IRON AND TIBC
%SAT: 17 % (ref 15–60)
Iron: 59 ug/dL (ref 50–180)
TIBC: 339 ug/dL (ref 250–425)
UIBC: 280 ug/dL (ref 125–400)

## 2015-11-29 LAB — TSH: TSH: 0.53 m[IU]/L (ref 0.40–4.50)

## 2015-11-29 LAB — T4, FREE: FREE T4: 1.7 ng/dL (ref 0.8–1.8)

## 2015-11-29 NOTE — Progress Notes (Signed)
Subjective: Chief Complaint  Patient presents with  . Advice Only    discuss lab results and discuss injection   Here for f/u to discuss recent abnormal labs.   Accompanied by wife today/ex wife?     Since last visit still having problems eating due to dental issues.  Sees dentist finally today.   If he takes more than 3 of his medications, gets sick, nauseated.   At last visit labs showed abnormality, so here to f/u on this.   He attributes his weight loss to not eating well given dental issues and nausea.   Still taking Metformin 1000mg  BID, Farxiga 10mg  daily and hasn't started [place] as he isn't; sure how to take it.    No prior international trips, no concern for hepatitis.    No prior blood transfusion.   No prior IV drug use.   No concern for STD.  Not sure if he has had HIV tests.    Single, has male partner x 12 years.    He thinks he had STD 30 years or more.   Drinks no alcohol.   Past Medical History:  Diagnosis Date  . Cardiomyopathy ([place])    a. Echo 2/17 - EF 35-40%, mod diff HK, inf-septal, inf-lat AK, Gr 1 DD, mild MR  . Carotid stenosis    a. Carotid US 2/17 - bilat ICA 1-39%  . Chronic combined systolic and diastolic CHF (congestive heart failure) ([place])   . Diabetes mellitus   . History of nuclear stress test    a. Nuc study 2/17 - Myocardial perfusion is abnormal. Findings consistent with prior myocardial infarction (inf, inf-lat, apical inf, apical lateral) with peri-infarct ischemia in apical inferolateral and apical lateral regions. This is a high risk study. Overall left ventricular systolic function was abnormal. LV cavity size is severely enlarged. Nuclear stress EF: 29%.   . Hyperlipidemia   . Peripheral vascular disease due to secondary diabetes mellitus ([place])    a. s/p R Fem-Tibial bypass 2/17     Current Outpatient Prescriptions on File Prior to Visit  Medication Sig Dispense Refill  . aspirin 81 MG EC tablet Take 1 tablet (81 mg total) by mouth daily.  Swallow whole. 90 tablet 3  . atorvastatin (LIPITOR) 40 MG tablet Take 1 tablet (40 mg total) by mouth daily. 90 tablet 3  . clopidogrel (PLAVIX) 75 MG tablet Take 1 tablet (75 mg total) by mouth daily. 90 tablet 3  . collagenase (SANTYL) ointment Apply topically daily. Apply to right foot wound daily. Place moist saline gauze on top and wrap foot daily. 15 g 0  . dapagliflozin propanediol (FARXIGA) 10 MG TABS tablet Take 10 mg by mouth daily. 90 tablet 1  . furosemide (LASIX) 20 MG tablet Take 1 tablet (20 mg total) by mouth daily. 90 tablet 3  . losartan (COZAAR) 25 MG tablet Take 1 tablet (25 mg total) by mouth daily. 90 tablet 3  . metFORMIN (GLUCOPHAGE) 1000 MG tablet Take 1 tablet (1,000 mg total) by mouth 2 (two) times daily with a meal. 180 tablet 3  . metoprolol succinate (TOPROL XL) 25 MG 24 hr tablet Take 1 tablet (25 mg total) by mouth daily. 90 tablet 3  . NEEDLE, DISP, 30 G (BD DISP NEEDLES) 30G X 1/2" MISC 1 each by Does not apply route at bedtime. 90 each 3  . pregabalin (LYRICA) 200 MG capsule Take 1 capsule (200 mg total) by mouth 2 (two) times daily. 60 capsule 2  .  Insulin Degludec (TRESIBA FLEXTOUCH) 100 UNIT/ML SOPN Inject 15 Units into the skin at bedtime. (Patient not taking: Reported on 11/29/2015) 6 mL 3  . nitroGLYCERIN (NITROSTAT) 0.4 MG SL tablet Place 1 tablet (0.4 mg total) under the tongue every 5 (five) minutes as needed for chest pain. (Patient not taking: Reported on 11/15/2015) 25 tablet 3  . traMADol (ULTRAM) 50 MG tablet Take 1 tablet (50 mg total) by mouth every 12 (twelve) hours as needed for moderate pain or severe pain. (Patient not taking: Reported on 11/29/2015) 45 tablet 0   No current facility-administered medications on file prior to visit.    Past Surgical History:  Procedure Laterality Date  . CARDIAC CATHETERIZATION N/A 06/30/2015   Procedure: Left Heart Cath and Coronary Angiography;  Surgeon: Jettie Booze, MD;  Location: [facility];   Service: Cardiovascular;  Laterality: N/A;  . ENDARTERECTOMY FEMORAL Right 06/02/2015   Procedure: ENDARTERECTOMY RIGHT PROFUNDA FEMORIS ARTERY;  Surgeon: Mal Misty, MD;  Location: [place];  Service: Vascular;  Laterality: Right;  . FEMORAL-TIBIAL BYPASS GRAFT Right 06/02/2015   Procedure: RIGHT PROFUNDA FEMORIS TO POSTERIOR TIBIAL ARTERY BYPASS USING RIGHT NON-REVERSED GREATER SAPHENOUS VEIN,  SUBCUTANEOUS TUNNEL;  Surgeon: Mal Misty, MD;  Location: [place];  Service: Vascular;  Laterality: Right;  . INTRAOPERATIVE ARTERIOGRAM Right 06/02/2015   Procedure: INTRA OPERATIVE ARTERIOGRAM RIGHT LOWER LEG;  Surgeon: Mal Misty, MD;  Location: [place];  Service: Vascular;  Laterality: Right;  . No history of Surgery     . PERIPHERAL VASCULAR CATHETERIZATION N/A 06/01/2015   Procedure: Abdominal Aortogram;  Surgeon: Serafina Mitchell, MD;  Location: [place] [facility];  Service: Cardiovascular;  Laterality: N/A;  . PERIPHERAL VASCULAR CATHETERIZATION N/A 07/07/2015   Procedure: Aortic Arch Angiography;  Surgeon: Conrad Jagual, MD;  Location: [place] [facility];  Service: Cardiovascular;  Laterality: N/A;  . PERIPHERAL VASCULAR CATHETERIZATION N/A 07/07/2015   Procedure: Upper Extremity Angiography;  Surgeon: Conrad Cuba, MD;  Location: [place] [facility];  Service: Cardiovascular;  Laterality: N/A;  . PERIPHERAL VASCULAR CATHETERIZATION Left 07/07/2015   Procedure: Peripheral Vascular Intervention;  Surgeon: Conrad Hydaburg, MD;  Location: [place] [facility];  Service: Cardiovascular;  Laterality: Left;  left subclavian and axillary  . VEIN HARVEST Right 06/02/2015   Procedure: VEIN HARVEST RIGHT GREATER SAPHENOUS VEIN;  Surgeon: Mal Misty, MD;  [facility];  Service: Vascular;  Laterality: Right;     Objective: BP 112/80   Pulse 83   Wt 169 lb (76.7 kg)   BMI 23.57 kg/m   Wt Readings from Last 3 Encounters:  11/29/15 169 lb (76.7 kg)  11/15/15 179 lb (81.2 kg)  09/08/15 198 lb 12.8 oz  (90.2 kg)   Gen: AA male, nad, losing weight Obvious jaundice of eyes and skin Lungs with decreased breath sounds, faint wheezes Heart: +murmur S1 throughout, normal S2, rrr Neck: supple, no mass, no lymphadenopathy No supraclavicular nodes Abdomen: +bs, soft, nontender, but liver fullness on exam Ext: no edema No obvious lymphadenopathy   Assessment: Encounter Diagnoses  Name Primary?  . Elevated LFTs Yes  . Anemia, unspecified   . DM (diabetes mellitus), type 2 with peripheral vascular complications ([place])   . Type 2 diabetes mellitus with diabetic neuropathy, without long-term current use of insulin ([place])   . 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 PAD (peripheral artery disease) ([place])   . Former  smoker   . Loss of weight     Plan: We discussed his recent labs and new findings of jaundice now and further weight loss.   I am concerned about malignancy given the new jaundice, worsening liver tests, weight loss, nausea.  discussed case with Dr. Redmond School as well.   We will get urgent CT, additional labs today.  discussed the possibility of cancer vs infection vs other obstructive process.   We demonstrated proper use of long acting insulin Tresiba today, gave glucometer and demonstrated how to use this.   Stop Wilder Glade given new info out on possible amputations with this class of medications.  He still sees vascular and wound clinic regularly for PVD, wounds of leg, and has f/u with cardiology too  F/u pending additional labs and CT scan.  Donald Steele was seen today for advice only.  Diagnoses and all orders for this visit:  Elevated LFTs -     TSH -     T4, free -     AFP tumor marker -     Hepatitis panel, acute -     Iron and TIBC -     Ferritin -     Cancel: CT CHEST LUNG CA SCREEN LOW DOSE W/O CM; Future -     Cancel: CT Abdomen Pelvis W Contrast; Future -     PSA -     Cancel: CT Chest Wo Contrast -     Cancel:  CT Chest W Contrast -     CT Chest W Contrast -     CT Abdomen Pelvis W Contrast  Anemia, unspecified -     TSH -     T4, free -     Cancel: CT CHEST LUNG CA SCREEN LOW DOSE W/O CM; Future -     Cancel: CT Abdomen Pelvis W Contrast; Future -     PSA  DM (diabetes mellitus), type 2 with peripheral vascular complications ([facility])  Type 2 diabetes mellitus with diabetic neuropathy, without long-term current use of insulin ([facility])  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, unspecified extremity ([place]) -     CT Chest W Contrast -     CT Abdomen Pelvis W Contrast  PAD (peripheral artery disease) ([place]) -     CT Chest W Contrast -     CT Abdomen Pelvis W Contrast  Former smoker -     Cancel: CT CHEST LUNG CA SCREEN LOW DOSE W/O CM; Future -     Cancel: CT Abdomen Pelvis W Contrast; Future -     PSA -     Cancel: CT Chest Wo Contrast -     Cancel: CT Chest W Contrast  Loss of weight -     Cancel: CT CHEST LUNG CA SCREEN LOW DOSE W/O CM; Future -     Cancel: CT Abdomen Pelvis W Contrast; Future -     PSA -     Cancel: CT Chest Wo Contrast -     Cancel: CT Chest W Contrast -     CT Chest W Contrast -     CT Abdomen Pelvis W Contrast   Adendum: On 12/01/15 CT results show worrisome mass in pancrease.   Discussed case with GI, Dr. Benson Norway who will see patient in [facility] ED for admit and procedure, biopsy.  Patient was contacted and will report to [facility] hospital today.

## 2015-11-29 NOTE — Telephone Encounter (Signed)
Pt is approved through pt's insurance to have ct chest, abdomen and pelvis with contrast.   Approval [id number] Valid- 11-29-15/12-28-15

## 2015-11-29 NOTE — Patient Instructions (Signed)
  Recommendations:  Begin Donald Steele long acting night time insulin 10 units per night  We will call with lab results  Go for CT chest, abdomen, pelvis as scheduled next Wednesday  For now, STOP Farxiga and Metformin  Begin checking sugars fasting in the morning and before meals

## 2015-11-30 ENCOUNTER — Ambulatory Visit (HOSPITAL_COMMUNITY)
Admission: RE | Admit: 2015-11-30 | Discharge: 2015-11-30 | Disposition: A | Discharge status: Home / Self Care | Payer: BLUE CROSS/BLUE SHIELD | Source: Ambulatory Visit | Attending: Medical | Admitting: Medical

## 2015-11-30 ENCOUNTER — Encounter (HOSPITAL_COMMUNITY): Payer: Self-pay

## 2015-11-30 ENCOUNTER — Telehealth: Payer: Self-pay | Admitting: Medical

## 2015-11-30 DIAGNOSIS — I700 Atherosclerosis of aorta: Secondary | ICD-10-CM | POA: Insufficient documentation

## 2015-11-30 DIAGNOSIS — R918 Other nonspecific abnormal finding of lung field: Secondary | ICD-10-CM | POA: Insufficient documentation

## 2015-11-30 DIAGNOSIS — I70229 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, unspecified extremity: Secondary | ICD-10-CM

## 2015-11-30 DIAGNOSIS — K869 Disease of pancreas, unspecified: Secondary | ICD-10-CM | POA: Insufficient documentation

## 2015-11-30 DIAGNOSIS — R634 Abnormal weight loss: Secondary | ICD-10-CM

## 2015-11-30 DIAGNOSIS — K769 Liver disease, unspecified: Secondary | ICD-10-CM

## 2015-11-30 DIAGNOSIS — R7989 Other specified abnormal findings of blood chemistry: Secondary | ICD-10-CM

## 2015-11-30 DIAGNOSIS — I739 Peripheral vascular disease, unspecified: Secondary | ICD-10-CM

## 2015-11-30 DIAGNOSIS — C250 Malignant neoplasm of head of pancreas: Secondary | ICD-10-CM | POA: Diagnosis not present

## 2015-11-30 DIAGNOSIS — L97509 Non-pressure chronic ulcer of other part of unspecified foot with unspecified severity: Secondary | ICD-10-CM | POA: Diagnosis not present

## 2015-11-30 DIAGNOSIS — I2510 Atherosclerotic heart disease of native coronary artery without angina pectoris: Secondary | ICD-10-CM | POA: Insufficient documentation

## 2015-11-30 LAB — HEPATIC FUNCTION PANEL
ALT: 194 U/L — ABNORMAL HIGH (ref 9–46)
AST: 116 U/L — ABNORMAL HIGH (ref 10–35)
Albumin: 3.9 g/dL (ref 3.6–5.1)
Alkaline Phosphatase: 896 U/L — ABNORMAL HIGH (ref 40–115)
BILIRUBIN DIRECT: 7.8 mg/dL — AB (ref <=0.2)
BILIRUBIN TOTAL: 13.9 mg/dL — AB (ref 0.2–1.2)
Indirect Bilirubin: 6.1 mg/dL — ABNORMAL HIGH (ref 0.2–1.2)
Total Protein: 7.4 g/dL (ref 6.1–8.1)

## 2015-11-30 LAB — PSA: PSA: 0.5 ng/mL (ref <=4.0)

## 2015-11-30 LAB — HEPATITIS PANEL, ACUTE
HCV Ab: NEGATIVE
HEP B C IGM: NONREACTIVE
Hep A IgM: NONREACTIVE
Hepatitis B Surface Ag: NEGATIVE

## 2015-11-30 MED ORDER — IOPAMIDOL (ISOVUE-300) INJECTION 61%
100.0000 mL | Freq: Once | INTRAVENOUS | Status: AC | PRN
  Administered 2015-11-30: 100 mL via INTRAVENOUS

## 2015-11-30 NOTE — Telephone Encounter (Signed)
Moved to today at [facility] long 4pm. Pt can not have solids for the rest of the day and someone needs to pickup the PREP for him to drink from [facility]. Pt is aware but acted as if he would not go to the appt. I voiced to him how important it was and leaving work would be a good idea pt still seemed reluctant but will call me back and let me know if he can make it to the appointment

## 2015-11-30 NOTE — Telephone Encounter (Signed)
I can't wait for the CT to be done next week.  He is jaundiced now, and we need the chest/abdomen/pelvic CT done today or tomorrow.   Call [facility] back or go through hospital, but we need the CT ASAP given his decline condition.    Also see what he said the dentist did yesterday?  Let me know. I discussed case with Dr.Lalonde.

## 2015-12-01 ENCOUNTER — Encounter: Payer: Self-pay | Admitting: Medical

## 2015-12-01 ENCOUNTER — Encounter (HOSPITAL_COMMUNITY): Payer: Self-pay | Admitting: Internal Medicine

## 2015-12-01 ENCOUNTER — Inpatient Hospital Stay (HOSPITAL_COMMUNITY)
Admission: EM | Admit: 2015-12-01 | Discharge: 2015-12-09 | DRG: 435 | Disposition: A | Discharge status: Home Health | Payer: BLUE CROSS/BLUE SHIELD | Attending: Internal Medicine | Admitting: Internal Medicine

## 2015-12-01 ENCOUNTER — Inpatient Hospital Stay (HOSPITAL_COMMUNITY): Payer: BLUE CROSS/BLUE SHIELD

## 2015-12-01 ENCOUNTER — Encounter (HOSPITAL_COMMUNITY): Payer: Self-pay | Admitting: Emergency Medicine

## 2015-12-01 DIAGNOSIS — I255 Ischemic cardiomyopathy: Secondary | ICD-10-CM | POA: Diagnosis present

## 2015-12-01 DIAGNOSIS — Z7902 Long term (current) use of antithrombotics/antiplatelets: Secondary | ICD-10-CM | POA: Diagnosis not present

## 2015-12-01 DIAGNOSIS — R1110 Vomiting, unspecified: Secondary | ICD-10-CM

## 2015-12-01 DIAGNOSIS — E1165 Type 2 diabetes mellitus with hyperglycemia: Secondary | ICD-10-CM | POA: Diagnosis present

## 2015-12-01 DIAGNOSIS — L97509 Non-pressure chronic ulcer of other part of unspecified foot with unspecified severity: Secondary | ICD-10-CM

## 2015-12-01 DIAGNOSIS — E11621 Type 2 diabetes mellitus with foot ulcer: Secondary | ICD-10-CM | POA: Diagnosis present

## 2015-12-01 DIAGNOSIS — Z79899 Other long term (current) drug therapy: Secondary | ICD-10-CM

## 2015-12-01 DIAGNOSIS — C801 Malignant (primary) neoplasm, unspecified: Secondary | ICD-10-CM

## 2015-12-01 DIAGNOSIS — Z87891 Personal history of nicotine dependence: Secondary | ICD-10-CM

## 2015-12-01 DIAGNOSIS — C250 Malignant neoplasm of head of pancreas: Principal | ICD-10-CM | POA: Diagnosis present

## 2015-12-01 DIAGNOSIS — R739 Hyperglycemia, unspecified: Secondary | ICD-10-CM

## 2015-12-01 DIAGNOSIS — I5042 Chronic combined systolic (congestive) and diastolic (congestive) heart failure: Secondary | ICD-10-CM | POA: Diagnosis present

## 2015-12-01 DIAGNOSIS — Z7982 Long term (current) use of aspirin: Secondary | ICD-10-CM | POA: Diagnosis not present

## 2015-12-01 DIAGNOSIS — M869 Osteomyelitis, unspecified: Secondary | ICD-10-CM | POA: Diagnosis present

## 2015-12-01 DIAGNOSIS — L97529 Non-pressure chronic ulcer of other part of left foot with unspecified severity: Secondary | ICD-10-CM | POA: Diagnosis present

## 2015-12-01 DIAGNOSIS — I110 Hypertensive heart disease with heart failure: Secondary | ICD-10-CM | POA: Diagnosis present

## 2015-12-01 DIAGNOSIS — I252 Old myocardial infarction: Secondary | ICD-10-CM

## 2015-12-01 DIAGNOSIS — L97919 Non-pressure chronic ulcer of unspecified part of right lower leg with unspecified severity: Secondary | ICD-10-CM | POA: Diagnosis present

## 2015-12-01 DIAGNOSIS — K8689 Other specified diseases of pancreas: Secondary | ICD-10-CM

## 2015-12-01 DIAGNOSIS — K269 Duodenal ulcer, unspecified as acute or chronic, without hemorrhage or perforation: Secondary | ICD-10-CM | POA: Diagnosis present

## 2015-12-01 DIAGNOSIS — Z7984 Long term (current) use of oral hypoglycemic drugs: Secondary | ICD-10-CM | POA: Diagnosis not present

## 2015-12-01 DIAGNOSIS — Z794 Long term (current) use of insulin: Secondary | ICD-10-CM | POA: Diagnosis present

## 2015-12-01 DIAGNOSIS — E785 Hyperlipidemia, unspecified: Secondary | ICD-10-CM | POA: Diagnosis present

## 2015-12-01 DIAGNOSIS — I2510 Atherosclerotic heart disease of native coronary artery without angina pectoris: Secondary | ICD-10-CM | POA: Diagnosis present

## 2015-12-01 DIAGNOSIS — L97929 Non-pressure chronic ulcer of unspecified part of left lower leg with unspecified severity: Secondary | ICD-10-CM | POA: Diagnosis present

## 2015-12-01 DIAGNOSIS — E1140 Type 2 diabetes mellitus with diabetic neuropathy, unspecified: Secondary | ICD-10-CM | POA: Diagnosis present

## 2015-12-01 DIAGNOSIS — C259 Malignant neoplasm of pancreas, unspecified: Secondary | ICD-10-CM

## 2015-12-01 DIAGNOSIS — K315 Obstruction of duodenum: Secondary | ICD-10-CM

## 2015-12-01 DIAGNOSIS — E1169 Type 2 diabetes mellitus with other specified complication: Secondary | ICD-10-CM | POA: Diagnosis present

## 2015-12-01 DIAGNOSIS — I739 Peripheral vascular disease, unspecified: Secondary | ICD-10-CM | POA: Diagnosis present

## 2015-12-01 DIAGNOSIS — B370 Candidal stomatitis: Secondary | ICD-10-CM | POA: Diagnosis present

## 2015-12-01 DIAGNOSIS — E871 Hypo-osmolality and hyponatremia: Secondary | ICD-10-CM

## 2015-12-01 DIAGNOSIS — E1151 Type 2 diabetes mellitus with diabetic peripheral angiopathy without gangrene: Secondary | ICD-10-CM | POA: Diagnosis present

## 2015-12-01 DIAGNOSIS — E1152 Type 2 diabetes mellitus with diabetic peripheral angiopathy with gangrene: Secondary | ICD-10-CM | POA: Diagnosis present

## 2015-12-01 DIAGNOSIS — K831 Obstruction of bile duct: Secondary | ICD-10-CM | POA: Diagnosis present

## 2015-12-01 DIAGNOSIS — I119 Hypertensive heart disease without heart failure: Secondary | ICD-10-CM | POA: Diagnosis present

## 2015-12-01 DIAGNOSIS — K869 Disease of pancreas, unspecified: Secondary | ICD-10-CM | POA: Diagnosis not present

## 2015-12-01 LAB — COMPREHENSIVE METABOLIC PANEL
ALT: 164 U/L — ABNORMAL HIGH (ref 17–63)
AST: 123 U/L — ABNORMAL HIGH (ref 15–41)
Albumin: 3.3 g/dL — ABNORMAL LOW (ref 3.5–5.0)
Alkaline Phosphatase: 740 U/L — ABNORMAL HIGH (ref 38–126)
Anion gap: 11 (ref 5–15)
BUN: 10 mg/dL (ref 6–20)
CO2: 25 mmol/L (ref 22–32)
Calcium: 9.3 mg/dL (ref 8.9–10.3)
Chloride: 95 mmol/L — ABNORMAL LOW (ref 101–111)
Creatinine, Ser: 0.6 mg/dL — ABNORMAL LOW (ref 0.61–1.24)
GFR calc Af Amer: >60 mL/min (ref >60)
GFR calc non Af Amer: >60 mL/min (ref >60)
Glucose, Bld: 469 mg/dL — ABNORMAL HIGH (ref 65–99)
Potassium: 3.7 mmol/L (ref 3.5–5.1)
Sodium: 131 mmol/L — ABNORMAL LOW (ref 135–145)
Total Bilirubin: 14.3 mg/dL — ABNORMAL HIGH (ref 0.3–1.2)
Total Protein: 7.7 g/dL (ref 6.5–8.1)

## 2015-12-01 LAB — CBC
HCT: 35.5 % — ABNORMAL LOW (ref 39.0–52.0)
Hemoglobin: 11.8 g/dL — ABNORMAL LOW (ref 13.0–17.0)
MCH: 28.1 pg (ref 26.0–34.0)
MCHC: 33.2 g/dL (ref 30.0–36.0)
MCV: 84.5 fL (ref 78.0–100.0)
Platelets: 365 10*3/uL (ref 150–400)
RBC: 4.2 MIL/uL — ABNORMAL LOW (ref 4.22–5.81)
RDW: 15.1 % (ref 11.5–15.5)
WBC: 7.3 10*3/uL (ref 4.0–10.5)

## 2015-12-01 LAB — LIPASE, BLOOD: Lipase: 13 U/L (ref 11–51)

## 2015-12-01 LAB — AFP TUMOR MARKER: AFP TUMOR MARKER: 1.6 ng/mL (ref <6.1)

## 2015-12-01 LAB — GLUCOSE, CAPILLARY: Glucose-Capillary: 245 mg/dL — ABNORMAL HIGH (ref 65–99)

## 2015-12-01 MED ORDER — METOPROLOL SUCCINATE ER 25 MG PO TB24
25.0000 mg | ORAL_TABLET | Freq: Every day | ORAL | Status: DC
  Administered 2015-12-01 – 2015-12-09 (×6): 25 mg via ORAL
  Filled 2015-12-01 (×7): qty 1

## 2015-12-01 MED ORDER — TRAMADOL HCL 50 MG PO TABS: 50.0000 mg | ORAL_TABLET | Freq: Two times a day (BID) | ORAL | Status: DC | PRN

## 2015-12-01 MED ORDER — FOLIC ACID 1 MG PO TABS
1.0000 mg | ORAL_TABLET | Freq: Every day | ORAL | Status: DC
  Administered 2015-12-01 – 2015-12-09 (×5): 1 mg via ORAL
  Filled 2015-12-01 (×8): qty 1

## 2015-12-01 MED ORDER — OXYCODONE HCL 5 MG PO TABS
5.0000 mg | ORAL_TABLET | ORAL | Status: DC | PRN
  Administered 2015-12-01 – 2015-12-07 (×4): 5 mg via ORAL
  Filled 2015-12-01 (×4): qty 1

## 2015-12-01 MED ORDER — ACETAMINOPHEN 650 MG RE SUPP: 650.0000 mg | Freq: Four times a day (QID) | RECTAL | Status: DC | PRN

## 2015-12-01 MED ORDER — SODIUM CHLORIDE 0.9 % IV SOLN: INTRAVENOUS | Status: DC

## 2015-12-01 MED ORDER — SODIUM CHLORIDE 0.9 % IV SOLN
INTRAVENOUS | Status: DC
  Administered 2015-12-01: 1000 mL via INTRAVENOUS
  Administered 2015-12-02: 19:00:00 via INTRAVENOUS

## 2015-12-01 MED ORDER — FUROSEMIDE 20 MG PO TABS
20.0000 mg | ORAL_TABLET | Freq: Every day | ORAL | Status: DC
  Administered 2015-12-05 – 2015-12-09 (×4): 20 mg via ORAL
  Filled 2015-12-01 (×7): qty 1

## 2015-12-01 MED ORDER — ONDANSETRON HCL 4 MG PO TABS: 4.0000 mg | ORAL_TABLET | Freq: Four times a day (QID) | ORAL | Status: DC | PRN

## 2015-12-01 MED ORDER — NYSTATIN 100000 UNIT/ML MT SUSP
5.0000 mL | Freq: Four times a day (QID) | OROMUCOSAL | Status: DC
  Administered 2015-12-01 – 2015-12-09 (×8): 500000 [IU] via ORAL
  Filled 2015-12-01 (×18): qty 5

## 2015-12-01 MED ORDER — ONDANSETRON HCL 4 MG/2ML IJ SOLN
4.0000 mg | Freq: Four times a day (QID) | INTRAMUSCULAR | Status: DC | PRN
  Administered 2015-12-04 – 2015-12-06 (×4): 4 mg via INTRAVENOUS
  Filled 2015-12-01 (×3): qty 2

## 2015-12-01 MED ORDER — PREGABALIN 100 MG PO CAPS
200.0000 mg | ORAL_CAPSULE | Freq: Two times a day (BID) | ORAL | Status: DC
  Administered 2015-12-01 – 2015-12-09 (×9): 200 mg via ORAL
  Filled 2015-12-01 (×13): qty 2

## 2015-12-01 MED ORDER — INSULIN ASPART 100 UNIT/ML ~~LOC~~ SOLN
10.0000 [IU] | Freq: Once | SUBCUTANEOUS | Status: AC
  Administered 2015-12-01: 10 [IU] via INTRAVENOUS
  Filled 2015-12-01: qty 1

## 2015-12-01 MED ORDER — ADULT MULTIVITAMIN W/MINERALS CH
1.0000 | ORAL_TABLET | Freq: Every day | ORAL | Status: DC
  Administered 2015-12-05 – 2015-12-09 (×4): 1 via ORAL
  Filled 2015-12-01 (×8): qty 1

## 2015-12-01 MED ORDER — SODIUM CHLORIDE 0.9 % IV BOLUS (SEPSIS)
1000.0000 mL | Freq: Once | INTRAVENOUS | Status: AC
  Administered 2015-12-01: 1000 mL via INTRAVENOUS

## 2015-12-01 MED ORDER — ACETAMINOPHEN 325 MG PO TABS: 650.0000 mg | ORAL_TABLET | Freq: Four times a day (QID) | ORAL | Status: DC | PRN

## 2015-12-01 MED ORDER — DOXYCYCLINE HYCLATE 100 MG PO TABS
100.0000 mg | ORAL_TABLET | Freq: Two times a day (BID) | ORAL | Status: DC
  Administered 2015-12-01 – 2015-12-09 (×9): 100 mg via ORAL
  Filled 2015-12-01 (×14): qty 1

## 2015-12-01 MED ORDER — INSULIN GLARGINE 100 UNIT/ML ~~LOC~~ SOLN
10.0000 [IU] | Freq: Every day | SUBCUTANEOUS | Status: DC
  Administered 2015-12-01 – 2015-12-04 (×4): 10 [IU] via SUBCUTANEOUS
  Filled 2015-12-01 (×4): qty 0.1

## 2015-12-01 MED ORDER — INSULIN ASPART 100 UNIT/ML ~~LOC~~ SOLN
0.0000 [IU] | Freq: Three times a day (TID) | SUBCUTANEOUS | Status: DC
  Administered 2015-12-02: 2 [IU] via SUBCUTANEOUS
  Administered 2015-12-03: 3 [IU] via SUBCUTANEOUS
  Administered 2015-12-03: 1 [IU] via SUBCUTANEOUS
  Administered 2015-12-04 (×2): 2 [IU] via SUBCUTANEOUS
  Administered 2015-12-05: 1 [IU] via SUBCUTANEOUS
  Administered 2015-12-07 (×2): 3 [IU] via SUBCUTANEOUS
  Administered 2015-12-08: 9 [IU] via SUBCUTANEOUS
  Administered 2015-12-08: 5 [IU] via SUBCUTANEOUS
  Administered 2015-12-08: 9 [IU] via SUBCUTANEOUS
  Administered 2015-12-09: 7 [IU] via SUBCUTANEOUS
  Administered 2015-12-09: 3 [IU] via SUBCUTANEOUS

## 2015-12-01 NOTE — ED Notes (Signed)
Unable to collect labs at this time. 

## 2015-12-01 NOTE — ED Notes (Signed)
MD at bedside. 

## 2015-12-01 NOTE — H&P (Signed)
History and Physical    [name] EHM:[id number] DOB: 1960-06-15 DOA: 12/01/2015  PCP: Crisoforo Oxford, PA-C  Patient coming from: Home   Chief Complaint: abnormal CT scan, labs, and weight loss.   HPI: Donald Steele is a 55 y.o. male with medical history significant of ischemic cardiomyopathy last EF 35 to 40 % 2-17,s /p R Fem-Tib bypass. , DM, PVD, L subclavian artery stenting. 07/07/15 , he relates weight loss since February, more than 60 pounds, diarrhea and decrease appetite. Report abdominal pain, sometimes after meals.  He relates that he has not been taking his plavix for a week now due to nausea. He has not ben taking most of his medications.   ED Course: presents for evaluation of pancreatic mass, obstructive jaundice. Sodium at 131, glucose 469, Alk phosphatase 740, albumin 3.3, lipase 13, AST 123, ALT 164, bili 14. CT abdomen pelvis done 8-16; 1. Poorly defined hypodense mass at the pancreatic head, measuring approximately 3.1 x 3.9 x 2.9 cm, highly concerning for primary pancreatic neoplasm. Associated mild pancreatic and biliary ductal dilatation as above. Hypodense lesions at the central aspect of the liver, somewhat indeterminate, and may reflect focally dilated biliary ducts. Possible metastases not entirely excluded. Single small 4 mm nodular density within the superior segment of the right lower lobe, indeterminate. Attention at follow-up recommended. Prominent coronary artery and aorto bi-iliac atherosclerotic disease.  Review of Systems: As per HPI otherwise 10 point review of systems negative.    Past Medical History:  Diagnosis Date  . Cardiomyopathy ([place])    a. Echo 2/17 - EF 35-40%, mod diff HK, inf-septal, inf-lat AK, Gr 1 DD, mild MR  . Carotid stenosis    a. Carotid US 2/17 - bilat ICA 1-39%  . Chronic combined systolic and diastolic CHF (congestive heart failure) ([place])   . Diabetes mellitus   . History of nuclear stress test    a. Nuc study 2/17 -  Myocardial perfusion is abnormal. Findings consistent with prior myocardial infarction (inf, inf-lat, apical inf, apical lateral) with peri-infarct ischemia in apical inferolateral and apical lateral regions. This is a high risk study. Overall left ventricular systolic function was abnormal. LV cavity size is severely enlarged. Nuclear stress EF: 29%.   . Hyperlipidemia   . Peripheral vascular disease due to secondary diabetes mellitus ([place])    a. s/p R Fem-Tibial bypass 2/17    Past Surgical History:  Procedure Laterality Date  . CARDIAC CATHETERIZATION N/A 06/30/2015   Procedure: Left Heart Cath and Coronary Angiography;  Surgeon: Jettie Booze, MD;  Location: [facility];  Service: Cardiovascular;  Laterality: N/A;  . ENDARTERECTOMY FEMORAL Right 06/02/2015   Procedure: ENDARTERECTOMY RIGHT PROFUNDA FEMORIS ARTERY;  Surgeon: Mal Misty, MD;  Location: [facility];  Service: Vascular;  Laterality: Right;  . FEMORAL-TIBIAL BYPASS GRAFT Right 06/02/2015   Procedure: RIGHT PROFUNDA FEMORIS TO POSTERIOR TIBIAL ARTERY BYPASS USING RIGHT NON-REVERSED GREATER SAPHENOUS VEIN,  SUBCUTANEOUS TUNNEL;  Surgeon: Mal Misty, MD;  Location: [facility];  Service: Vascular;  Laterality: Right;  . INTRAOPERATIVE ARTERIOGRAM Right 06/02/2015   Procedure: INTRA OPERATIVE ARTERIOGRAM RIGHT LOWER LEG;  Surgeon: Mal Misty, MD;  Location: [facility];  Service: Vascular;  Laterality: Right;  . No history of Surgery     . PERIPHERAL VASCULAR CATHETERIZATION N/A 06/01/2015   Procedure: Abdominal Aortogram;  Surgeon: Serafina Mitchell, MD;  Location: [facility];  Service: Cardiovascular;  Laterality: N/A;  . PERIPHERAL VASCULAR CATHETERIZATION N/A 07/07/2015  Procedure: Aortic Arch Angiography;  Surgeon: Conrad [place], MD;  Location: [facility];  Service: Cardiovascular;  Laterality: N/A;  . PERIPHERAL VASCULAR CATHETERIZATION N/A 07/07/2015   Procedure: Upper Extremity Angiography;  Surgeon: Conrad Montfort, MD;  Location: [facility];  Service: Cardiovascular;  Laterality: N/A;  . PERIPHERAL VASCULAR CATHETERIZATION Left 07/07/2015   Procedure: Peripheral Vascular Intervention;  Surgeon: Conrad Newberry, MD;  Location: [facility];  Service: Cardiovascular;  Laterality: Left;  left subclavian and axillary  . VEIN HARVEST Right 06/02/2015   Procedure: VEIN HARVEST RIGHT GREATER SAPHENOUS VEIN;  Surgeon: Mal Misty, MD;  Location: [facility];  Service: Vascular;  Laterality: Right;     reports that he has quit smoking. He has a 35.00 pack-year smoking history. He has never used smokeless tobacco. He reports that he does not drink alcohol or use drugs.  No Known Allergies  Family History  Problem Relation Age of Onset  . Heart attack Neg Hx       Prior to Admission medications   Medication Sig Start Date End Date Taking? Authorizing Provider  aspirin 81 MG EC tablet Take 1 tablet (81 mg total) by mouth daily. Swallow whole. 07/15/15  Yes Camelia Eng Tysinger, PA-C  atorvastatin (LIPITOR) 40 MG tablet Take 1 tablet (40 mg total) by mouth daily. 07/14/15  Yes Liliane Shi, PA-C  clopidogrel (PLAVIX) 75 MG tablet Take 1 tablet (75 mg total) by mouth daily. 07/15/15  Yes Camelia Eng Tysinger, PA-C  collagenase (SANTYL) ointment Apply topically daily. Apply to right foot wound daily. Place moist saline gauze on top and wrap foot daily. 06/07/15  Yes Alvia Grove, PA-C  furosemide (LASIX) 20 MG tablet Take 1 tablet (20 mg total) by mouth daily. 07/15/15  Yes Camelia Eng Tysinger, PA-C  Insulin Degludec (TRESIBA FLEXTOUCH) 100 UNIT/ML SOPN Inject 15 Units into the skin at bedtime. Patient taking differently: Inject 10 Units into the skin at bedtime.  11/17/15  Yes Camelia Eng Tysinger, PA-C  losartan (COZAAR) 25 MG tablet Take 1 tablet (25 mg total) by mouth daily. 07/15/15  Yes Camelia Eng Tysinger, PA-C  metoprolol succinate (TOPROL XL) 25 MG 24 hr tablet Take 1 tablet (25 mg total) by mouth daily. 07/15/15   Yes David S Tysinger, PA-C  NEEDLE, DISP, 30 G (BD DISP NEEDLES) 30G X 1/2" MISC 1 each by Does not apply route at bedtime. 11/17/15  Yes Camelia Eng Tysinger, PA-C  nitroGLYCERIN (NITROSTAT) 0.4 MG SL tablet Place 1 tablet (0.4 mg total) under the tongue every 5 (five) minutes as needed for chest pain. 06/17/15  Yes Scott T Kathlen Mody, PA-C  pregabalin (LYRICA) 200 MG capsule Take 1 capsule (200 mg total) by mouth 2 (two) times daily. 11/15/15  Yes Camelia Eng Tysinger, PA-C  traMADol (ULTRAM) 50 MG tablet Take 1 tablet (50 mg total) by mouth every 12 (twelve) hours as needed for moderate pain or severe pain. 11/15/15  Yes Camelia Eng Tysinger, PA-C  dapagliflozin propanediol (FARXIGA) 10 MG TABS tablet Take 10 mg by mouth daily. Patient not taking: Reported on 12/01/2015 11/17/15   Camelia Eng Tysinger, PA-C  metFORMIN (GLUCOPHAGE) 1000 MG tablet Take 1 tablet (1,000 mg total) by mouth 2 (two) times daily with a meal. Patient not taking: Reported on 12/01/2015 07/15/15   Carlena Hurl, PA-C    Physical Exam: Vitals:   12/01/15 1450  BP: 124/67  Pulse: 79  Resp: 16  Temp: 98.1  F (36.7 C)  TempSrc: Oral  SpO2: 99%      Constitutional: NAD, calm, comfortable Vitals:   12/01/15 1450  BP: 124/67  Pulse: 79  Resp: 16  Temp: 98.1 F (36.7 C)  TempSrc: Oral  SpO2: 99%   Eyes: PERRL, lids and conjunctivae normal, icteric. ENMT: Mucous membranes are moist. Posterior pharynx clear of any exudate or lesions.Normal dentition.  Neck: normal, supple, no masses, no thyromegaly Respiratory: clear to auscultation bilaterally, no wheezing, no crackles. Normal respiratory effort. No accessory muscle use.  Cardiovascular: Regular rate and rhythm, no murmurs / rubs / gallops. No extremity edema. 2+ pedal pulses. No carotid bruits.  Abdomen: no tenderness, no masses palpated. No hepatosplenomegaly. Bowel sounds positive. No significant pain on palpation.  Musculoskeletal: no clubbing / cyanosis. No joint deformity upper and  lower extremities. Good ROM, no contractures. Normal muscle tone.  Skin: no rashes, lesions, ulcers. No induration Neurologic: CN 2-12 grossly intact. Sensation intact, DTR normal. Strength 5/5 in all 4.  Psychiatric: Normal judgment and insight. Alert and oriented x 3. Normal mood.     Labs on Admission: I have personally reviewed following labs and imaging studies  CBC:  Recent Labs Lab 12/01/15 1621  WBC 7.3  HGB 11.8*  HCT 35.5*  MCV 84.5  PLT 553   Basic Metabolic Panel:  Recent Labs Lab 12/01/15 1621  NA 131*  K 3.7  CL 95*  CO2 25  GLUCOSE 469*  BUN 10  CREATININE 0.60*  CALCIUM 9.3   GFR: Estimated Creatinine Clearance: 111.1 mL/min (by C-G formula based on SCr of 0.8 mg/dL). Liver Function Tests:  Recent Labs Lab 11/29/15 1230 12/01/15 1621  AST 116* 123*  ALT 194* 164*  ALKPHOS 896* 740*  BILITOT 13.9* 14.3*  PROT 7.4 7.7  ALBUMIN 3.9 3.3*    Recent Labs Lab 12/01/15 1621  LIPASE 13   No results for input(s): AMMONIA in the last 168 hours. Coagulation Profile: No results for input(s): INR, PROTIME in the last 168 hours. Cardiac Enzymes: No results for input(s): CKTOTAL, CKMB, CKMBINDEX, TROPONINI in the last 168 hours. BNP (last 3 results) No results for input(s): PROBNP in the last 8760 hours. HbA1C: No results for input(s): HGBA1C in the last 72 hours. CBG: No results for input(s): GLUCAP in the last 168 hours. Lipid Profile: No results for input(s): CHOL, HDL, LDLCALC, TRIG, CHOLHDL, LDLDIRECT in the last 72 hours. Thyroid Function Tests:  Recent Labs  11/29/15 1131  TSH 0.53  FREET4 1.7   Anemia Panel:  Recent Labs  11/29/15 1230  FERRITIN 645*  TIBC 339  IRON 59   Urine analysis:    Component Value Date/Time   COLORURINE YELLOW 06/01/2015 2013   APPEARANCEUR CLEAR 06/01/2015 2013   LABSPEC 1.026 06/01/2015 2013   PHURINE 6.0 06/01/2015 2013   GLUCOSEU >1000 (A) 06/01/2015 2013   HGBUR NEGATIVE 06/01/2015 2013     BILIRUBINUR n 11/15/2015 [place] 06/01/2015 2013   PROTEINUR n 11/15/2015 1249   PROTEINUR NEGATIVE 06/01/2015 2013   UROBILINOGEN 0.2 11/15/2015 1249   NITRITE n 11/15/2015 1249   NITRITE NEGATIVE 06/01/2015 2013   LEUKOCYTESUR Negative 11/15/2015 1249   Sepsis Labs: !!!!!!!!!!!!!!!!!!!!!!!!!!!!!!!!!!!!!!!!!!!! @LABRCNTIP (procalcitonin:4,lacticidven:4) )No results found for this or any previous visit (from the past 240 hour(s)).   Radiological Exams on Admission: Ct Chest W Contrast  Result Date: 11/30/2015 CLINICAL DATA:  Initial evaluation for weight loss, elevated liver function tests, jaundice. EXAM: CT CHEST, ABDOMEN, AND PELVIS WITH  CONTRAST TECHNIQUE: Multidetector CT imaging of the chest, abdomen and pelvis was performed following the standard protocol during bolus administration of intravenous contrast. CONTRAST:  12m ISOVUE-300 IOPAMIDOL (ISOVUE-300) INJECTION 61% COMPARISON:  None available. FINDINGS: CT CHEST FINDINGS Few scattered subcentimeter hypodense nodules partially visualize within the thyroid gland, of doubtful clinical significance. Visualized thyroid otherwise unremarkable. No pathologically enlarged mediastinal, hilar, or axillary lymph nodes identified. Intrathoracic aorta of normal caliber without acute abnormality. Scattered calcified plaque within the aortic arch in at the origin of the great vessels. Vascular stent in place at the proximal left subclavian artery. Flow within the stent appears to be patent. No high-grade stenosis at the origin of the great vessels. Heart size within normal limits. Prominent coronary artery calcifications noted, most evident within the LAD. No pericardial effusion. Limited evaluation the pulmonary arteries grossly unremarkable. The lungs are clear without focal infiltrate, pulmonary edema, or pleural effusion. No pneumothorax. Single 4 mm nodular density noted within the superior segment of the right lower lobe  (series 4, image 63), indeterminate. Additional 2 mm nodular density along the right major fissure (series 4, image 53), most consistent with a small intra pulmonic lymph node. No other worrisome pulmonary nodule or mass identified. No acute osseous abnormality. No worrisome lytic or blastic osseous lesions. CT ABDOMEN PELVIS FINDINGS Few scattered cystic lucencies noted within the right hepatic lobe near the gallbladder fossa, largest of which measures 1.5 x 1.7 cm (series 2, image 55). These are indeterminate, and may in reflect dilated biliary ducts. Liver otherwise unremarkable with no other focal intrahepatic lesions identified. Gallbladder somewhat hydropic in appearance without acute inflammatory changes. Common bile duct mildly dilated at 8 mm. Mild intrahepatic biliary dilatation at the hilum of the liver. No cholelithiasis or choledocholithiasis identified. Spleen and adrenal glands within normal limits. There is a somewhat ill-defined hypodense mass centered at the pancreatic head which measures approximately 3.1 x 3.9 x 2.9 cm, highly concerning for primary pancreatic neoplasm. Pancreas at this level is somewhat ill-defined which shaggy margins. There is dilatation of the pancreatic duct distally up to 4 mm. Remainder the pancreatic body and tail are somewhat atrophic in appearance. Mass closely approximates the gastric antrum/pyloric region anteriorly as well as the duodenum laterally. Fat about the celiac axis and SMA is well preserved without frank vascular encasement. No significant peripancreatic adenopathy. Kidneys are equal in size with symmetric enhancement. No nephrolithiasis, hydronephrosis, or focal enhancing renal mass. Stomach within normal limits. No evidence for bowel obstruction. No abnormal wall thickening, mucosal enhancement, or inflammatory fat stranding seen about the bowels. Appendix well visualized in the right lower quadrant and is normal caliber and appearance without associated  inflammatory changes suggest acute appendicitis. Moderate amount of retained stool within the colon. Partially distended bladder within normal limits without acute abnormality. Prostate within normal limits. No free intraperitoneal air. No free fluid. No pathologically enlarged intra-abdominal or pelvic lymph nodes. Postsurgical changes noted within the right groin. Advanced aorto bi-iliac atherosclerotic disease. No aneurysm. No acute osseous abnormality. No worrisome lytic or blastic osseous lesions. IMPRESSION: 1. Poorly defined hypodense mass at the pancreatic head, measuring approximately 3.1 x 3.9 x 2.9 cm, highly concerning for primary pancreatic neoplasm. Associated mild pancreatic and biliary ductal dilatation as above. 2. Hypodense lesions at the central aspect of the liver, somewhat indeterminate, and may reflect focally dilated biliary ducts. Possible metastases not entirely excluded. 3. Single small 4 mm nodular density within the superior segment of the right lower lobe, indeterminate. Attention at  follow-up recommended. 4. Prominent coronary artery and aorto bi-iliac atherosclerotic disease. Electronically Signed   By: Jeannine Boga M.D.   On: 11/30/2015 17:02   Ct Abdomen Pelvis W Contrast  Result Date: 11/30/2015 CLINICAL DATA:  Initial evaluation for weight loss, elevated liver function tests, jaundice. EXAM: CT CHEST, ABDOMEN, AND PELVIS WITH CONTRAST TECHNIQUE: Multidetector CT imaging of the chest, abdomen and pelvis was performed following the standard protocol during bolus administration of intravenous contrast. CONTRAST:  120m ISOVUE-300 IOPAMIDOL (ISOVUE-300) INJECTION 61% COMPARISON:  None available. FINDINGS: CT CHEST FINDINGS Few scattered subcentimeter hypodense nodules partially visualize within the thyroid gland, of doubtful clinical significance. Visualized thyroid otherwise unremarkable. No pathologically enlarged mediastinal, hilar, or axillary lymph nodes identified.  Intrathoracic aorta of normal caliber without acute abnormality. Scattered calcified plaque within the aortic arch in at the origin of the great vessels. Vascular stent in place at the proximal left subclavian artery. Flow within the stent appears to be patent. No high-grade stenosis at the origin of the great vessels. Heart size within normal limits. Prominent coronary artery calcifications noted, most evident within the LAD. No pericardial effusion. Limited evaluation the pulmonary arteries grossly unremarkable. The lungs are clear without focal infiltrate, pulmonary edema, or pleural effusion. No pneumothorax. Single 4 mm nodular density noted within the superior segment of the right lower lobe (series 4, image 63), indeterminate. Additional 2 mm nodular density along the right major fissure (series 4, image 53), most consistent with a small intra pulmonic lymph node. No other worrisome pulmonary nodule or mass identified. No acute osseous abnormality. No worrisome lytic or blastic osseous lesions. CT ABDOMEN PELVIS FINDINGS Few scattered cystic lucencies noted within the right hepatic lobe near the gallbladder fossa, largest of which measures 1.5 x 1.7 cm (series 2, image 55). These are indeterminate, and may in reflect dilated biliary ducts. Liver otherwise unremarkable with no other focal intrahepatic lesions identified. Gallbladder somewhat hydropic in appearance without acute inflammatory changes. Common bile duct mildly dilated at 8 mm. Mild intrahepatic biliary dilatation at the hilum of the liver. No cholelithiasis or choledocholithiasis identified. Spleen and adrenal glands within normal limits. There is a somewhat ill-defined hypodense mass centered at the pancreatic head which measures approximately 3.1 x 3.9 x 2.9 cm, highly concerning for primary pancreatic neoplasm. Pancreas at this level is somewhat ill-defined which shaggy margins. There is dilatation of the pancreatic duct distally up to 4 mm.  Remainder the pancreatic body and tail are somewhat atrophic in appearance. Mass closely approximates the gastric antrum/pyloric region anteriorly as well as the duodenum laterally. Fat about the celiac axis and SMA is well preserved without frank vascular encasement. No significant peripancreatic adenopathy. Kidneys are equal in size with symmetric enhancement. No nephrolithiasis, hydronephrosis, or focal enhancing renal mass. Stomach within normal limits. No evidence for bowel obstruction. No abnormal wall thickening, mucosal enhancement, or inflammatory fat stranding seen about the bowels. Appendix well visualized in the right lower quadrant and is normal caliber and appearance without associated inflammatory changes suggest acute appendicitis. Moderate amount of retained stool within the colon. Partially distended bladder within normal limits without acute abnormality. Prostate within normal limits. No free intraperitoneal air. No free fluid. No pathologically enlarged intra-abdominal or pelvic lymph nodes. Postsurgical changes noted within the right groin. Advanced aorto bi-iliac atherosclerotic disease. No aneurysm. No acute osseous abnormality. No worrisome lytic or blastic osseous lesions. IMPRESSION: 1. Poorly defined hypodense mass at the pancreatic head, measuring approximately 3.1 x 3.9 x 2.9 cm, highly concerning  for primary pancreatic neoplasm. Associated mild pancreatic and biliary ductal dilatation as above. 2. Hypodense lesions at the central aspect of the liver, somewhat indeterminate, and may reflect focally dilated biliary ducts. Possible metastases not entirely excluded. 3. Single small 4 mm nodular density within the superior segment of the right lower lobe, indeterminate. Attention at follow-up recommended. 4. Prominent coronary artery and aorto bi-iliac atherosclerotic disease. Electronically Signed   By: Jeannine Boga M.D.   On: 11/30/2015 17:02    EKG: none available.    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  Plan for EUS biopsy in a week.  Continue to hold plavix aspirin for stent placement.   2-CAD, Cardiomyopathy;  Hold ACE for procedure.  continue with metoprolol.  Resume plavix and aspirin pot procedure when ok by Dr Benson Norway  Resume lasix tomorrow.   3-Pancreatic insufficiency; start pancreatic enzymes post procedure.   4-Diabetes; uncontrolled, hyperglycemia; continue with Insulin. SSI.   5-oral candidiasis; nystatin orederd.   6-left fifth toe ulcer; patient to have MRI to rule out osteomyelitis.  Patient was prescribe Doxy, will continue.  Multiple ulcers LE; wound care consulted.   DVT prophylaxis:  Code Status: full code.  Family Communication:  Disposition Plan: Home in 2 to 3 days  Consults called: Dr Benson Norway GI Admission status: impatient.    Elmarie Shiley MD [facility] Pager [phone]  If 7PM-7AM, please contact night-coverage [email or web address] Password [email or web address]  12/01/2015, 5:15 PM

## 2015-12-01 NOTE — ED Notes (Signed)
Nurse will attempt lab draws with IV start.

## 2015-12-01 NOTE — ED Triage Notes (Signed)
Pt had CT done yesterday which showed mass on pancreas. Pt was told to come to the emergency department to see a specialist. Verified that pt was to be seen in the ED not specialist office. Pt also complains of anorexia, diarrhea, hyperglycemia, and leg ulcers. Being seen at the wound care clinic for wounds.

## 2015-12-01 NOTE — ED Provider Notes (Signed)
[facility] DEPT Provider Note   CSN: [id number] Arrival date & time: 12/01/15  1435  By signing my name below, I, Donald Steele, attest that this documentation has been prepared under the direction and in the presence of Donald Manifold, MD. Electronically Signed: Rayna [facility], ED Scribe. 12/01/15. 3:39 PM.   History   Chief Complaint Chief Complaint  Patient presents with  . Abdominal Pain    HPI HPI Comments: Donald Steele is a 55 y.o. male who presents to the Emergency Department for evaluation by Donald Steele (GI) due to being notified that a CT he had performed yesterday shows a mass on his pancreas. He states he has not felt well for a long period of time noting he is currently experiencing yellowing eyes, weight loss including 60 pounds since February 2017, fatigue, multiple wounds to his LEs which are being monitored at the wound clinic as well as dark urine which he says "sometimes looks like soda". Pt is a former smoker and states he quit 6 months ago.   The history is provided by the patient. No language interpreter was used.    Past Medical History:  Diagnosis Date  . Cardiomyopathy ([place])    a. Echo 2/17 - EF 35-40%, mod diff HK, inf-septal, inf-lat AK, Gr 1 DD, mild MR  . Carotid stenosis    a. Carotid US 2/17 - bilat ICA 1-39%  . Chronic combined systolic and diastolic CHF (congestive heart failure) ([place])   . Diabetes mellitus   . History of nuclear stress test    a. Nuc study 2/17 - Myocardial perfusion is abnormal. Findings consistent with prior myocardial infarction (inf, inf-lat, apical inf, apical lateral) with peri-infarct ischemia in apical inferolateral and apical lateral regions. This is a high risk study. Overall left ventricular systolic function was abnormal. LV cavity size is severely enlarged. Nuclear stress EF: 29%.   . Hyperlipidemia   . Peripheral vascular disease due to secondary diabetes mellitus ([place])    a. s/p R Fem-Tibial bypass 2/17     Patient Active Problem List   Diagnosis Date Noted  . Former smoker 11/15/2015  . Vaccine counseling 11/15/2015  . Tooth decay 11/15/2015  . Ischemic cardiomyopathy 07/14/2015  . CAD (coronary artery disease) 07/14/2015  . Hypertensive heart disease 07/14/2015  . HLD (hyperlipidemia) 07/14/2015  . Subclavian artery stenosis, left 07/07/2015  . Abnormal stress test   . Ischemic finger 06/28/2015  . Chronic combined systolic and diastolic CHF (congestive heart failure) ([place])   . 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 Preop cardiovascular exam 06/01/2015  . Carotid bruit 06/01/2015  . Nonhealing ulcer of right lower extremity ([place])   . DM (diabetes mellitus), type 2 with peripheral vascular complications ([place]) [id number]  . Femoral bruit 05/31/2015  . Smoker 05/31/2015  . Skin breakdown 05/31/2015  . Leg ulcer, left ([place]) 05/31/2015  . Type 2 diabetes mellitus with diabetic neuropathy, without long-term current use of insulin ([place]) 05/31/2015  . Right foot pain 05/31/2015  . PAD (peripheral artery disease) ([place]) 05/31/2015    Past Surgical History:  Procedure Laterality Date  . CARDIAC CATHETERIZATION N/A 06/30/2015   Procedure: Left Heart Cath and Coronary Angiography;  Surgeon: Donald Booze, MD;  Location: [facility];  Service: Cardiovascular;  Laterality: N/A;  . ENDARTERECTOMY FEMORAL Right 06/02/2015   Procedure: ENDARTERECTOMY RIGHT PROFUNDA FEMORIS ARTERY;  Surgeon: Donald Misty, MD;  Location: [facility];  Service: Vascular;  Laterality:  Right;  Marland Kitchen FEMORAL-TIBIAL BYPASS GRAFT Right 06/02/2015   Procedure: RIGHT PROFUNDA FEMORIS TO POSTERIOR TIBIAL ARTERY BYPASS USING RIGHT NON-REVERSED GREATER SAPHENOUS VEIN,  SUBCUTANEOUS TUNNEL;  Surgeon: Donald Misty, MD;  Location: [place];  Service: Vascular;  Laterality: Right;  . INTRAOPERATIVE ARTERIOGRAM Right 06/02/2015   Procedure: INTRA OPERATIVE  ARTERIOGRAM RIGHT LOWER LEG;  Surgeon: Donald Misty, MD;  Location: [facility];  Service: Vascular;  Laterality: Right;  . No history of Surgery     . PERIPHERAL VASCULAR CATHETERIZATION N/A 06/01/2015   Procedure: Abdominal Aortogram;  Surgeon: Donald Mitchell, MD;  Location: [facility];  Service: Cardiovascular;  Laterality: N/A;  . PERIPHERAL VASCULAR CATHETERIZATION N/A 07/07/2015   Procedure: Aortic Arch Angiography;  Surgeon: Donald Lavon, MD;  Location: [facility];  Service: Cardiovascular;  Laterality: N/A;  . PERIPHERAL VASCULAR CATHETERIZATION N/A 07/07/2015   Procedure: Upper Extremity Angiography;  Surgeon: Donald Bolindale, MD;  Location: [facility];  Service: Cardiovascular;  Laterality: N/A;  . PERIPHERAL VASCULAR CATHETERIZATION Left 07/07/2015   Procedure: Peripheral Vascular Intervention;  Surgeon: Donald [place], MD;  Location: [facility];  Service: Cardiovascular;  Laterality: Left;  left subclavian and axillary  . VEIN HARVEST Right 06/02/2015   Procedure: VEIN HARVEST RIGHT GREATER SAPHENOUS VEIN;  Surgeon: Donald Misty, MD;  Location: [facility];  Service: Vascular;  Laterality: Right;       Home Medications    Prior to Admission medications   Medication Sig Start Date End Date Taking? Authorizing Provider  aspirin 81 MG EC tablet Take 1 tablet (81 mg total) by mouth daily. Swallow whole. 07/15/15  Yes Donald Eng Tysinger, PA-C  atorvastatin (LIPITOR) 40 MG tablet Take 1 tablet (40 mg total) by mouth daily. 07/14/15  Yes Donald Shi, PA-C  clopidogrel (PLAVIX) 75 MG tablet Take 1 tablet (75 mg total) by mouth daily. 07/15/15  Yes Donald Eng Tysinger, PA-C  collagenase (SANTYL) ointment Apply topically daily. Apply to right foot wound daily. Place moist saline gauze on top and wrap foot daily. 06/07/15  Yes Donald Grove, PA-C  furosemide (LASIX) 20 MG tablet Take 1 tablet (20 mg total) by mouth daily. 07/15/15  Yes Donald Eng Tysinger, PA-C  Insulin Degludec  (TRESIBA FLEXTOUCH) 100 UNIT/ML SOPN Inject 15 Units into the skin at bedtime. Patient taking differently: Inject 10 Units into the skin at bedtime.  11/17/15  Yes Donald Eng Tysinger, PA-C  losartan (COZAAR) 25 MG tablet Take 1 tablet (25 mg total) by mouth daily. 07/15/15  Yes Donald Eng Tysinger, PA-C  metoprolol succinate (TOPROL XL) 25 MG 24 hr tablet Take 1 tablet (25 mg total) by mouth daily. 07/15/15  Yes David S Tysinger, PA-C  NEEDLE, DISP, 30 G (BD DISP NEEDLES) 30G X 1/2" MISC 1 each by Does not apply route at bedtime. 11/17/15  Yes Donald Eng Tysinger, PA-C  nitroGLYCERIN (NITROSTAT) 0.4 MG SL tablet Place 1 tablet (0.4 mg total) under the tongue every 5 (five) minutes as needed for chest pain. 06/17/15  Yes Scott T Kathlen Mody, PA-C  pregabalin (LYRICA) 200 MG capsule Take 1 capsule (200 mg total) by mouth 2 (two) times daily. 11/15/15  Yes Donald Eng Tysinger, PA-C  traMADol (ULTRAM) 50 MG tablet Take 1 tablet (50 mg total) by mouth every 12 (twelve) hours as needed for moderate pain or severe pain. 11/15/15  Yes Donald Eng Tysinger, PA-C  dapagliflozin propanediol (FARXIGA) 10 MG TABS tablet  Take 10 mg by mouth daily. Patient not taking: Reported on 12/01/2015 11/17/15   Donald Eng Tysinger, PA-C  metFORMIN (GLUCOPHAGE) 1000 MG tablet Take 1 tablet (1,000 mg total) by mouth 2 (two) times daily with a meal. Patient not taking: Reported on 12/01/2015 07/15/15   Carlena Hurl, PA-C    Family History Family History  Problem Relation Age of Onset  . Heart attack Neg Hx     Social History Social History  Substance Use Topics  . Smoking status: Former Smoker    Packs/day: 1.00    Years: 35.00  . Smokeless tobacco: Never Used  . Alcohol use No     Allergies   Review of patient's allergies indicates no known allergies.   Review of Systems Review of Systems  Constitutional: Positive for activity change, appetite change, fatigue and unexpected weight change.  Skin: Positive for wound.  All other systems  reviewed and are negative.  Physical Exam Updated Vital Signs BP 124/67   Pulse 79   Temp 98.1 F (36.7 C) (Oral)   Resp 16   SpO2 99%   Physical Exam  Constitutional: He is oriented to person, place, and time. He appears well-developed.  Pt appears tired and is thin with a lot of loose skin.   HENT:  Head: Normocephalic.  Eyes: EOM are normal. Scleral icterus is present.  Neck: Normal range of motion.  Pulmonary/Chest: Effort normal.  Abdominal: He exhibits no distension.  Musculoskeletal: Normal range of motion.  Neurological: He is alert and oriented to person, place, and time.  Psychiatric: He has a normal mood and affect.  Nursing note and vitals reviewed.  ED Treatments / Results  Labs (all labs ordered are listed, but only abnormal results are displayed) Labs Reviewed  COMPREHENSIVE METABOLIC PANEL - Abnormal; Notable for the following:       Result Value   Sodium 131 (*)    Chloride 95 (*)    Glucose, Bld 469 (*)    Creatinine, Ser 0.60 (*)    Albumin 3.3 (*)    AST 123 (*)    ALT 164 (*)    Alkaline Phosphatase 740 (*)    Total Bilirubin 14.3 (*)    All other components within normal limits  CBC - Abnormal; Notable for the following:    RBC 4.20 (*)    Hemoglobin 11.8 (*)    HCT 35.5 (*)    All other components within normal limits  LIPASE, BLOOD  URINALYSIS, ROUTINE W REFLEX MICROSCOPIC (NOT AT [facility])    EKG  EKG Interpretation None       Radiology Ct Chest W Contrast  Result Date: 11/30/2015 CLINICAL DATA:  Initial evaluation for weight loss, elevated liver function tests, jaundice. EXAM: CT CHEST, ABDOMEN, AND PELVIS WITH CONTRAST TECHNIQUE: Multidetector CT imaging of the chest, abdomen and pelvis was performed following the standard protocol during bolus administration of intravenous contrast. CONTRAST:  159mL ISOVUE-300 IOPAMIDOL (ISOVUE-300) INJECTION 61% COMPARISON:  None available. FINDINGS: CT CHEST FINDINGS Few scattered subcentimeter  hypodense nodules partially visualize within the thyroid gland, of doubtful clinical significance. Visualized thyroid otherwise unremarkable. No pathologically enlarged mediastinal, hilar, or axillary lymph nodes identified. Intrathoracic aorta of normal caliber without acute abnormality. Scattered calcified plaque within the aortic arch in at the origin of the great vessels. Vascular stent in place at the proximal left subclavian artery. Flow within the stent appears to be patent. No high-grade stenosis at the origin of the great vessels. Heart size within  normal limits. Prominent coronary artery calcifications noted, most evident within the LAD. No pericardial effusion. Limited evaluation the pulmonary arteries grossly unremarkable. The lungs are clear without focal infiltrate, pulmonary edema, or pleural effusion. No pneumothorax. Single 4 mm nodular density noted within the superior segment of the right lower lobe (series 4, image 63), indeterminate. Additional 2 mm nodular density along the right major fissure (series 4, image 53), most consistent with a small intra pulmonic lymph node. No other worrisome pulmonary nodule or mass identified. No acute osseous abnormality. No worrisome lytic or blastic osseous lesions. CT ABDOMEN PELVIS FINDINGS Few scattered cystic lucencies noted within the right hepatic lobe near the gallbladder fossa, largest of which measures 1.5 x 1.7 cm (series 2, image 55). These are indeterminate, and may in reflect dilated biliary ducts. Liver otherwise unremarkable with no other focal intrahepatic lesions identified. Gallbladder somewhat hydropic in appearance without acute inflammatory changes. Common bile duct mildly dilated at 8 mm. Mild intrahepatic biliary dilatation at the hilum of the liver. No cholelithiasis or choledocholithiasis identified. Spleen and adrenal glands within normal limits. There is a somewhat ill-defined hypodense mass centered at the pancreatic head which  measures approximately 3.1 x 3.9 x 2.9 cm, highly concerning for primary pancreatic neoplasm. Pancreas at this level is somewhat ill-defined which shaggy margins. There is dilatation of the pancreatic duct distally up to 4 mm. Remainder the pancreatic body and tail are somewhat atrophic in appearance. Mass closely approximates the gastric antrum/pyloric region anteriorly as well as the duodenum laterally. Fat about the celiac axis and SMA is well preserved without frank vascular encasement. No significant peripancreatic adenopathy. Kidneys are equal in size with symmetric enhancement. No nephrolithiasis, hydronephrosis, or focal enhancing renal mass. Stomach within normal limits. No evidence for bowel obstruction. No abnormal wall thickening, mucosal enhancement, or inflammatory fat stranding seen about the bowels. Appendix well visualized in the right lower quadrant and is normal caliber and appearance without associated inflammatory changes suggest acute appendicitis. Moderate amount of retained stool within the colon. Partially distended bladder within normal limits without acute abnormality. Prostate within normal limits. No free intraperitoneal air. No free fluid. No pathologically enlarged intra-abdominal or pelvic lymph nodes. Postsurgical changes noted within the right groin. Advanced aorto bi-iliac atherosclerotic disease. No aneurysm. No acute osseous abnormality. No worrisome lytic or blastic osseous lesions. IMPRESSION: 1. Poorly defined hypodense mass at the pancreatic head, measuring approximately 3.1 x 3.9 x 2.9 cm, highly concerning for primary pancreatic neoplasm. Associated mild pancreatic and biliary ductal dilatation as above. 2. Hypodense lesions at the central aspect of the liver, somewhat indeterminate, and may reflect focally dilated biliary ducts. Possible metastases not entirely excluded. 3. Single small 4 mm nodular density within the superior segment of the right lower lobe,  indeterminate. Attention at follow-up recommended. 4. Prominent coronary artery and aorto bi-iliac atherosclerotic disease. Electronically Signed   By: Jeannine Boga M.D.   On: 11/30/2015 17:02   Ct Abdomen Pelvis W Contrast  Result Date: 11/30/2015 CLINICAL DATA:  Initial evaluation for weight loss, elevated liver function tests, jaundice. EXAM: CT CHEST, ABDOMEN, AND PELVIS WITH CONTRAST TECHNIQUE: Multidetector CT imaging of the chest, abdomen and pelvis was performed following the standard protocol during bolus administration of intravenous contrast. CONTRAST:  169mL ISOVUE-300 IOPAMIDOL (ISOVUE-300) INJECTION 61% COMPARISON:  None available. FINDINGS: CT CHEST FINDINGS Few scattered subcentimeter hypodense nodules partially visualize within the thyroid gland, of doubtful clinical significance. Visualized thyroid otherwise unremarkable. No pathologically enlarged mediastinal, hilar, or axillary lymph  nodes identified. Intrathoracic aorta of normal caliber without acute abnormality. Scattered calcified plaque within the aortic arch in at the origin of the great vessels. Vascular stent in place at the proximal left subclavian artery. Flow within the stent appears to be patent. No high-grade stenosis at the origin of the great vessels. Heart size within normal limits. Prominent coronary artery calcifications noted, most evident within the LAD. No pericardial effusion. Limited evaluation the pulmonary arteries grossly unremarkable. The lungs are clear without focal infiltrate, pulmonary edema, or pleural effusion. No pneumothorax. Single 4 mm nodular density noted within the superior segment of the right lower lobe (series 4, image 63), indeterminate. Additional 2 mm nodular density along the right major fissure (series 4, image 53), most consistent with a small intra pulmonic lymph node. No other worrisome pulmonary nodule or mass identified. No acute osseous abnormality. No worrisome lytic or blastic  osseous lesions. CT ABDOMEN PELVIS FINDINGS Few scattered cystic lucencies noted within the right hepatic lobe near the gallbladder fossa, largest of which measures 1.5 x 1.7 cm (series 2, image 55). These are indeterminate, and may in reflect dilated biliary ducts. Liver otherwise unremarkable with no other focal intrahepatic lesions identified. Gallbladder somewhat hydropic in appearance without acute inflammatory changes. Common bile duct mildly dilated at 8 mm. Mild intrahepatic biliary dilatation at the hilum of the liver. No cholelithiasis or choledocholithiasis identified. Spleen and adrenal glands within normal limits. There is a somewhat ill-defined hypodense mass centered at the pancreatic head which measures approximately 3.1 x 3.9 x 2.9 cm, highly concerning for primary pancreatic neoplasm. Pancreas at this level is somewhat ill-defined which shaggy margins. There is dilatation of the pancreatic duct distally up to 4 mm. Remainder the pancreatic body and tail are somewhat atrophic in appearance. Mass closely approximates the gastric antrum/pyloric region anteriorly as well as the duodenum laterally. Fat about the celiac axis and SMA is well preserved without frank vascular encasement. No significant peripancreatic adenopathy. Kidneys are equal in size with symmetric enhancement. No nephrolithiasis, hydronephrosis, or focal enhancing renal mass. Stomach within normal limits. No evidence for bowel obstruction. No abnormal wall thickening, mucosal enhancement, or inflammatory fat stranding seen about the bowels. Appendix well visualized in the right lower quadrant and is normal caliber and appearance without associated inflammatory changes suggest acute appendicitis. Moderate amount of retained stool within the colon. Partially distended bladder within normal limits without acute abnormality. Prostate within normal limits. No free intraperitoneal air. No free fluid. No pathologically enlarged  intra-abdominal or pelvic lymph nodes. Postsurgical changes noted within the right groin. Advanced aorto bi-iliac atherosclerotic disease. No aneurysm. No acute osseous abnormality. No worrisome lytic or blastic osseous lesions. IMPRESSION: 1. Poorly defined hypodense mass at the pancreatic head, measuring approximately 3.1 x 3.9 x 2.9 cm, highly concerning for primary pancreatic neoplasm. Associated mild pancreatic and biliary ductal dilatation as above. 2. Hypodense lesions at the central aspect of the liver, somewhat indeterminate, and may reflect focally dilated biliary ducts. Possible metastases not entirely excluded. 3. Single small 4 mm nodular density within the superior segment of the right lower lobe, indeterminate. Attention at follow-up recommended. 4. Prominent coronary artery and aorto bi-iliac atherosclerotic disease. Electronically Signed   By: Jeannine Boga M.D.   On: 11/30/2015 17:02    Procedures Procedures  DIAGNOSTIC STUDIES: Oxygen Saturation is 99% on RA, normal by my interpretation.    COORDINATION OF CARE: 3:38 PM Discussed next steps with pt. Pt verbalized understanding and is agreeable with the plan.  Medications Ordered in ED Medications - No data to display   Initial Impression / Assessment and Plan / ED Course  I have reviewed the triage vital signs and the nursing notes.  Pertinent labs & imaging results that were available during my care of the patient were reviewed by me and considered in my medical decision making (see chart for details).  Clinical Course    54yM with obstructing pancreatic mass. Evaluated by Dr Donald [facility], GI. Plan for ERCP tomorrow. Hyperglycemia without acidosis. IVF. Insulin. Medicine admission.   I personally preformed the services scribed in my presence. The recorded information has been reviewed is accurate. Donald Manifold, MD.  Final Clinical Impressions(s) / ED Diagnoses   Final diagnoses:  Pancreatic mass  Hyperglycemia     New Prescriptions New Prescriptions   No medications on file     Donald Manifold, MD 12/01/15 1704

## 2015-12-01 NOTE — ED Notes (Signed)
Pt's wounds on his left leg and left small toe were rewrapped. The area has yellowish green drainage and a foul smell. Pt states he has not changed his bandage since Tuesday when the wound care center changed her bandage

## 2015-12-01 NOTE — Consult Note (Signed)
Reason for Consult: Obstructive jaundice Referring Physician: [facility]  Jesselee Bonifay HPI: This is a 55 year old male with a PMH of CAD, hyperlipidemia, DM, and PVD admitted for obstructive jaundice.  The patient recalls symptoms that started in February this year with diarrhea.  His diarrhea is oily and as a result he has lost weight.  The patient reports that his weight dropped from 235 lbs down to 170 lbs, but it seemed to accelerate over the past couple of months.  It was during this time that he also felt tired, nauseous, anorexic, and difficult to control diabetes.  At first he thought that his symptoms were secondary his poor dentition and his inability to tolerate PO, but he had his top dentition removed last week.  He reports feeling unwell for the past several days and he was noted by his PCP that he had an increase in his liver enzymes.  In fact, on 11/15/2015 he was found to have a TB of 5.7 and AP at 485.  On 11/29/2015, the follow up liver panel revealed a worsening of his values:  TB 13.9 and AP 896.  A CT scan was performed and a 3.1 x 3.9 x 2.9 cm ill-defined pancreatic head mass was found.  The CBD is measured to be 8 mm, but there is a mild intrahepatic biliary ductal dilation.  No obvious mets noted on the CT scan.    Past Medical History:  Diagnosis Date  . Cardiomyopathy ([place])    a. Echo 2/17 - EF 35-40%, mod diff HK, inf-septal, inf-lat AK, Gr 1 DD, mild MR  . Carotid stenosis    a. Carotid US 2/17 - bilat ICA 1-39%  . Chronic combined systolic and diastolic CHF (congestive heart failure) ([place])   . Diabetes mellitus   . History of nuclear stress test    a. Nuc study 2/17 - Myocardial perfusion is abnormal. Findings consistent with prior myocardial infarction (inf, inf-lat, apical inf, apical lateral) with peri-infarct ischemia in apical inferolateral and apical lateral regions. This is a high risk study. Overall left ventricular systolic function was abnormal. LV cavity  size is severely enlarged. Nuclear stress EF: 29%.   . Hyperlipidemia   . Peripheral vascular disease due to secondary diabetes mellitus ([place])    a. s/p R Fem-Tibial bypass 2/17    Past Surgical History:  Procedure Laterality Date  . CARDIAC CATHETERIZATION N/A 06/30/2015   Procedure: Left Heart Cath and Coronary Angiography;  Surgeon: Jettie Booze, MD;  Location: [place];  Service: Cardiovascular;  Laterality: N/A;  . ENDARTERECTOMY FEMORAL Right 06/02/2015   Procedure: ENDARTERECTOMY RIGHT PROFUNDA FEMORIS ARTERY;  Surgeon: Mal Misty, MD;  Location: [facility];  Service: Vascular;  Laterality: Right;  . FEMORAL-TIBIAL BYPASS GRAFT Right 06/02/2015   Procedure: RIGHT PROFUNDA FEMORIS TO POSTERIOR TIBIAL ARTERY BYPASS USING RIGHT NON-REVERSED GREATER SAPHENOUS VEIN,  SUBCUTANEOUS TUNNEL;  Surgeon: Mal Misty, MD;  Location: [facility];  Service: Vascular;  Laterality: Right;  . INTRAOPERATIVE ARTERIOGRAM Right 06/02/2015   Procedure: INTRA OPERATIVE ARTERIOGRAM RIGHT LOWER LEG;  Surgeon: Mal Misty, MD;  Location: [facility];  Service: Vascular;  Laterality: Right;  . No history of Surgery     . PERIPHERAL VASCULAR CATHETERIZATION N/A 06/01/2015   Procedure: Abdominal Aortogram;  Surgeon: Serafina Mitchell, MD;  Location: [place] [facility];  Service: Cardiovascular;  Laterality: N/A;  . PERIPHERAL VASCULAR CATHETERIZATION N/A 07/07/2015   Procedure: Aortic Arch Angiography;  Surgeon: Aaron Edelman  Starlyn Skeans, MD;  Location: [place] [facility];  Service: Cardiovascular;  Laterality: N/A;  . PERIPHERAL VASCULAR CATHETERIZATION N/A 07/07/2015   Procedure: Upper Extremity Angiography;  Surgeon: Conrad , MD;  Location: [facility];  Service: Cardiovascular;  Laterality: N/A;  . PERIPHERAL VASCULAR CATHETERIZATION Left 07/07/2015   Procedure: Peripheral Vascular Intervention;  Surgeon: Conrad , MD;  Location: [place] [facility];  Service: Cardiovascular;  Laterality: Left;  left  subclavian and axillary  . VEIN HARVEST Right 06/02/2015   Procedure: VEIN HARVEST RIGHT GREATER SAPHENOUS VEIN;  Surgeon: Mal Misty, MD;  Location: [facility];  Service: Vascular;  Laterality: Right;    Family History  Problem Relation Age of Onset  . Heart attack Neg Hx     Social History:  reports that he has quit smoking. He has a 35.00 pack-year smoking history. He has never used smokeless tobacco. He reports that he does not drink alcohol or use drugs.  Allergies: No Known Allergies  Medications: Scheduled: Continuous:  No results found for this or any previous visit (from the past 24 hour(s)).   Ct Chest W Contrast  Result Date: 11/30/2015 CLINICAL DATA:  Initial evaluation for weight loss, elevated liver function tests, jaundice. EXAM: CT CHEST, ABDOMEN, AND PELVIS WITH CONTRAST TECHNIQUE: Multidetector CT imaging of the chest, abdomen and pelvis was performed following the standard protocol during bolus administration of intravenous contrast. CONTRAST:  187mL ISOVUE-300 IOPAMIDOL (ISOVUE-300) INJECTION 61% COMPARISON:  None available. FINDINGS: CT CHEST FINDINGS Few scattered subcentimeter hypodense nodules partially visualize within the thyroid gland, of doubtful clinical significance. Visualized thyroid otherwise unremarkable. No pathologically enlarged mediastinal, hilar, or axillary lymph nodes identified. Intrathoracic aorta of normal caliber without acute abnormality. Scattered calcified plaque within the aortic arch in at the origin of the great vessels. Vascular stent in place at the proximal left subclavian artery. Flow within the stent appears to be patent. No high-grade stenosis at the origin of the great vessels. Heart size within normal limits. Prominent coronary artery calcifications noted, most evident within the LAD. No pericardial effusion. Limited evaluation the pulmonary arteries grossly unremarkable. The lungs are clear without focal infiltrate, pulmonary edema, or  pleural effusion. No pneumothorax. Single 4 mm nodular density noted within the superior segment of the right lower lobe (series 4, image 63), indeterminate. Additional 2 mm nodular density along the right major fissure (series 4, image 53), most consistent with a small intra pulmonic lymph node. No other worrisome pulmonary nodule or mass identified. No acute osseous abnormality. No worrisome lytic or blastic osseous lesions. CT ABDOMEN PELVIS FINDINGS Few scattered cystic lucencies noted within the right hepatic lobe near the gallbladder fossa, largest of which measures 1.5 x 1.7 cm (series 2, image 55). These are indeterminate, and may in reflect dilated biliary ducts. Liver otherwise unremarkable with no other focal intrahepatic lesions identified. Gallbladder somewhat hydropic in appearance without acute inflammatory changes. Common bile duct mildly dilated at 8 mm. Mild intrahepatic biliary dilatation at the hilum of the liver. No cholelithiasis or choledocholithiasis identified. Spleen and adrenal glands within normal limits. There is a somewhat ill-defined hypodense mass centered at the pancreatic head which measures approximately 3.1 x 3.9 x 2.9 cm, highly concerning for primary pancreatic neoplasm. Pancreas at this level is somewhat ill-defined which shaggy margins. There is dilatation of the pancreatic duct distally up to 4 mm. Remainder the pancreatic body and tail are somewhat atrophic in appearance. Mass closely approximates the gastric antrum/pyloric region anteriorly  as well as the duodenum laterally. Fat about the celiac axis and SMA is well preserved without frank vascular encasement. No significant peripancreatic adenopathy. Kidneys are equal in size with symmetric enhancement. No nephrolithiasis, hydronephrosis, or focal enhancing renal mass. Stomach within normal limits. No evidence for bowel obstruction. No abnormal wall thickening, mucosal enhancement, or inflammatory fat stranding seen about  the bowels. Appendix well visualized in the right lower quadrant and is normal caliber and appearance without associated inflammatory changes suggest acute appendicitis. Moderate amount of retained stool within the colon. Partially distended bladder within normal limits without acute abnormality. Prostate within normal limits. No free intraperitoneal air. No free fluid. No pathologically enlarged intra-abdominal or pelvic lymph nodes. Postsurgical changes noted within the right groin. Advanced aorto bi-iliac atherosclerotic disease. No aneurysm. No acute osseous abnormality. No worrisome lytic or blastic osseous lesions. IMPRESSION: 1. Poorly defined hypodense mass at the pancreatic head, measuring approximately 3.1 x 3.9 x 2.9 cm, highly concerning for primary pancreatic neoplasm. Associated mild pancreatic and biliary ductal dilatation as above. 2. Hypodense lesions at the central aspect of the liver, somewhat indeterminate, and may reflect focally dilated biliary ducts. Possible metastases not entirely excluded. 3. Single small 4 mm nodular density within the superior segment of the right lower lobe, indeterminate. Attention at follow-up recommended. 4. Prominent coronary artery and aorto bi-iliac atherosclerotic disease. Electronically Signed   By: Jeannine Boga M.D.   On: 11/30/2015 17:02   Ct Abdomen Pelvis W Contrast  Result Date: 11/30/2015 CLINICAL DATA:  Initial evaluation for weight loss, elevated liver function tests, jaundice. EXAM: CT CHEST, ABDOMEN, AND PELVIS WITH CONTRAST TECHNIQUE: Multidetector CT imaging of the chest, abdomen and pelvis was performed following the standard protocol during bolus administration of intravenous contrast. CONTRAST:  171mL ISOVUE-300 IOPAMIDOL (ISOVUE-300) INJECTION 61% COMPARISON:  None available. FINDINGS: CT CHEST FINDINGS Few scattered subcentimeter hypodense nodules partially visualize within the thyroid gland, of doubtful clinical significance.  Visualized thyroid otherwise unremarkable. No pathologically enlarged mediastinal, hilar, or axillary lymph nodes identified. Intrathoracic aorta of normal caliber without acute abnormality. Scattered calcified plaque within the aortic arch in at the origin of the great vessels. Vascular stent in place at the proximal left subclavian artery. Flow within the stent appears to be patent. No high-grade stenosis at the origin of the great vessels. Heart size within normal limits. Prominent coronary artery calcifications noted, most evident within the LAD. No pericardial effusion. Limited evaluation the pulmonary arteries grossly unremarkable. The lungs are clear without focal infiltrate, pulmonary edema, or pleural effusion. No pneumothorax. Single 4 mm nodular density noted within the superior segment of the right lower lobe (series 4, image 63), indeterminate. Additional 2 mm nodular density along the right major fissure (series 4, image 53), most consistent with a small intra pulmonic lymph node. No other worrisome pulmonary nodule or mass identified. No acute osseous abnormality. No worrisome lytic or blastic osseous lesions. CT ABDOMEN PELVIS FINDINGS Few scattered cystic lucencies noted within the right hepatic lobe near the gallbladder fossa, largest of which measures 1.5 x 1.7 cm (series 2, image 55). These are indeterminate, and may in reflect dilated biliary ducts. Liver otherwise unremarkable with no other focal intrahepatic lesions identified. Gallbladder somewhat hydropic in appearance without acute inflammatory changes. Common bile duct mildly dilated at 8 mm. Mild intrahepatic biliary dilatation at the hilum of the liver. No cholelithiasis or choledocholithiasis identified. Spleen and adrenal glands within normal limits. There is a somewhat ill-defined hypodense mass centered at the pancreatic head  which measures approximately 3.1 x 3.9 x 2.9 cm, highly concerning for primary pancreatic neoplasm. Pancreas  at this level is somewhat ill-defined which shaggy margins. There is dilatation of the pancreatic duct distally up to 4 mm. Remainder the pancreatic body and tail are somewhat atrophic in appearance. Mass closely approximates the gastric antrum/pyloric region anteriorly as well as the duodenum laterally. Fat about the celiac axis and SMA is well preserved without frank vascular encasement. No significant peripancreatic adenopathy. Kidneys are equal in size with symmetric enhancement. No nephrolithiasis, hydronephrosis, or focal enhancing renal mass. Stomach within normal limits. No evidence for bowel obstruction. No abnormal wall thickening, mucosal enhancement, or inflammatory fat stranding seen about the bowels. Appendix well visualized in the right lower quadrant and is normal caliber and appearance without associated inflammatory changes suggest acute appendicitis. Moderate amount of retained stool within the colon. Partially distended bladder within normal limits without acute abnormality. Prostate within normal limits. No free intraperitoneal air. No free fluid. No pathologically enlarged intra-abdominal or pelvic lymph nodes. Postsurgical changes noted within the right groin. Advanced aorto bi-iliac atherosclerotic disease. No aneurysm. No acute osseous abnormality. No worrisome lytic or blastic osseous lesions. IMPRESSION: 1. Poorly defined hypodense mass at the pancreatic head, measuring approximately 3.1 x 3.9 x 2.9 cm, highly concerning for primary pancreatic neoplasm. Associated mild pancreatic and biliary ductal dilatation as above. 2. Hypodense lesions at the central aspect of the liver, somewhat indeterminate, and may reflect focally dilated biliary ducts. Possible metastases not entirely excluded. 3. Single small 4 mm nodular density within the superior segment of the right lower lobe, indeterminate. Attention at follow-up recommended. 4. Prominent coronary artery and aorto bi-iliac atherosclerotic  disease. Electronically Signed   By: Jeannine Boga M.D.   On: 11/30/2015 17:02    ROS:  As stated above in the HPI otherwise negative.  Blood pressure 124/67, pulse 79, temperature 98.1 F (36.7 C), temperature source Oral, resp. rate 16, SpO2 99 %.    PE: Gen: NAD, Alert and Oriented HEENT:  [place]/AT, EOMI Neck: Supple, no LAD Lungs: CTA Bilaterally CV: RRR without M/G/R ABM: Soft, NTND, +BS Ext: No C/C/E Skin: wrinkles consistent with weight loss.  Assessment/Plan: 1) Pancreatic head mass. 2) Obstructive jaundice. 3) Weight loss. 4) Diarrhea secondary to pancreatic insufficiency.   I had a long discussion with the patient and the plan to place a stent with the ERCP and a subsequent EUS with FNA.  He has pancreatic insufficiency, which as resulted in his steatorrhea and weight loss.  Plan: 1) ERCP with stent placement. 2) EUS with FNA in the near future. 3) Check Ca 19-9. 4) Start on Creon after the ERCP tomorrow.  He needs to be NPO after midnight and he will not experience the benefit of the medication until he is back on a regular diet.  Semiyah Newgent D 12/01/2015, 3:50 PM

## 2015-12-01 NOTE — ED Notes (Signed)
Lavender, light green and blue top sent to lab.

## 2015-12-01 NOTE — ED Notes (Signed)
Family at bedside. 

## 2015-12-01 NOTE — ED Notes (Signed)
Unit will call when they are ready for report

## 2015-12-02 ENCOUNTER — Inpatient Hospital Stay (HOSPITAL_COMMUNITY): Payer: BLUE CROSS/BLUE SHIELD

## 2015-12-02 ENCOUNTER — Inpatient Hospital Stay (HOSPITAL_COMMUNITY): Payer: BLUE CROSS/BLUE SHIELD | Admitting: Anesthesiology

## 2015-12-02 ENCOUNTER — Encounter (HOSPITAL_COMMUNITY)
Admission: EM | Disposition: A | Discharge status: Home Health | Payer: Self-pay | Source: Home / Self Care | Attending: Internal Medicine

## 2015-12-02 ENCOUNTER — Encounter (HOSPITAL_COMMUNITY): Payer: Self-pay | Admitting: Certified Registered Nurse Anesthetist

## 2015-12-02 HISTORY — PX: ERCP: SHX5425

## 2015-12-02 LAB — COMPREHENSIVE METABOLIC PANEL
ALBUMIN: 3.1 g/dL — AB (ref 3.5–5.0)
ALK PHOS: 704 U/L — AB (ref 38–126)
ALT: 154 U/L — ABNORMAL HIGH (ref 17–63)
ANION GAP: 8 (ref 5–15)
AST: 107 U/L — ABNORMAL HIGH (ref 15–41)
BILIRUBIN TOTAL: 13.3 mg/dL — AB (ref 0.3–1.2)
BUN: 7 mg/dL (ref 6–20)
CALCIUM: 9.5 mg/dL (ref 8.9–10.3)
CO2: 27 mmol/L (ref 22–32)
Chloride: 101 mmol/L (ref 101–111)
Creatinine, Ser: 0.46 mg/dL — ABNORMAL LOW (ref 0.61–1.24)
GLUCOSE: 202 mg/dL — AB (ref 65–99)
POTASSIUM: 4.4 mmol/L (ref 3.5–5.1)
Sodium: 136 mmol/L (ref 135–145)
TOTAL PROTEIN: 7.3 g/dL (ref 6.5–8.1)

## 2015-12-02 LAB — CBC
HEMATOCRIT: 34.8 % — AB (ref 39.0–52.0)
HEMOGLOBIN: 11.4 g/dL — AB (ref 13.0–17.0)
MCH: 27.8 pg (ref 26.0–34.0)
MCHC: 32.8 g/dL (ref 30.0–36.0)
MCV: 84.9 fL (ref 78.0–100.0)
Platelets: 332 10*3/uL (ref 150–400)
RBC: 4.1 MIL/uL — AB (ref 4.22–5.81)
RDW: 14.7 % (ref 11.5–15.5)
WBC: 7.6 10*3/uL (ref 4.0–10.5)

## 2015-12-02 LAB — C-REACTIVE PROTEIN

## 2015-12-02 LAB — PROTIME-INR
INR: 1.18
PROTHROMBIN TIME: 15.1 s (ref 11.4–15.2)

## 2015-12-02 LAB — URINALYSIS, ROUTINE W REFLEX MICROSCOPIC
HGB URINE DIPSTICK: NEGATIVE
Ketones, ur: NEGATIVE mg/dL
Nitrite: NEGATIVE
PH: 6 (ref 5.0–8.0)
PROTEIN: NEGATIVE mg/dL
Specific Gravity, Urine: 1.021 (ref 1.005–1.030)

## 2015-12-02 LAB — URINE MICROSCOPIC-ADD ON: RBC / HPF: NONE SEEN RBC/hpf (ref 0–5)

## 2015-12-02 LAB — GLUCOSE, CAPILLARY
GLUCOSE-CAPILLARY: 187 mg/dL — AB (ref 65–99)
GLUCOSE-CAPILLARY: 249 mg/dL — AB (ref 65–99)
Glucose-Capillary: 174 mg/dL — ABNORMAL HIGH (ref 65–99)

## 2015-12-02 LAB — SEDIMENTATION RATE: Sed Rate: 48 mm/hr — ABNORMAL HIGH (ref 0–16)

## 2015-12-02 SURGERY — ERCP, WITH INTERVENTION IF INDICATED
Anesthesia: General

## 2015-12-02 MED ORDER — FENTANYL CITRATE (PF) 100 MCG/2ML IJ SOLN
INTRAMUSCULAR | Status: AC
  Filled 2015-12-02: qty 2

## 2015-12-02 MED ORDER — ROCURONIUM BROMIDE 100 MG/10ML IV SOLN
INTRAVENOUS | Status: DC | PRN
  Administered 2015-12-02: 30 mg via INTRAVENOUS
  Administered 2015-12-02 (×2): 10 mg via INTRAVENOUS

## 2015-12-02 MED ORDER — PROPOFOL 10 MG/ML IV BOLUS
INTRAVENOUS | Status: DC | PRN
  Administered 2015-12-02: 100 mg via INTRAVENOUS

## 2015-12-02 MED ORDER — PROPOFOL 10 MG/ML IV BOLUS
INTRAVENOUS | Status: AC
  Filled 2015-12-02: qty 40

## 2015-12-02 MED ORDER — SUCCINYLCHOLINE CHLORIDE 20 MG/ML IJ SOLN
INTRAMUSCULAR | Status: DC | PRN
  Administered 2015-12-02: 100 mg via INTRAVENOUS

## 2015-12-02 MED ORDER — CIPROFLOXACIN IN D5W 400 MG/200ML IV SOLN
INTRAVENOUS | Status: DC | PRN
  Administered 2015-12-02: 400 mg via INTRAVENOUS

## 2015-12-02 MED ORDER — SUGAMMADEX SODIUM 200 MG/2ML IV SOLN
INTRAVENOUS | Status: DC | PRN
  Administered 2015-12-02: 200 mg via INTRAVENOUS

## 2015-12-02 MED ORDER — PHENYLEPHRINE HCL 10 MG/ML IJ SOLN
INTRAVENOUS | Status: DC | PRN
  Administered 2015-12-02: 20 ug/min via INTRAVENOUS

## 2015-12-02 MED ORDER — INDOMETHACIN 50 MG RE SUPP
RECTAL | Status: AC
  Filled 2015-12-02: qty 2

## 2015-12-02 MED ORDER — SODIUM CHLORIDE 0.9 % IV SOLN: INTRAVENOUS | Status: DC

## 2015-12-02 MED ORDER — CIPROFLOXACIN IN D5W 400 MG/200ML IV SOLN
INTRAVENOUS | Status: AC
  Filled 2015-12-02: qty 200

## 2015-12-02 MED ORDER — ONDANSETRON HCL 4 MG/2ML IJ SOLN
INTRAMUSCULAR | Status: DC | PRN
  Administered 2015-12-02: 4 mg via INTRAVENOUS

## 2015-12-02 MED ORDER — GLUCAGON HCL RDNA (DIAGNOSTIC) 1 MG IJ SOLR
INTRAMUSCULAR | Status: AC
  Filled 2015-12-02: qty 1

## 2015-12-02 MED ORDER — INDOMETHACIN 50 MG RE SUPP
RECTAL | Status: DC | PRN
  Administered 2015-12-02 (×2): 50 mg via RECTAL

## 2015-12-02 MED ORDER — FENTANYL CITRATE (PF) 100 MCG/2ML IJ SOLN
25.0000 ug | INTRAMUSCULAR | Status: DC | PRN
  Administered 2015-12-02: 50 ug via INTRAVENOUS
  Administered 2015-12-02 (×2): 25 ug via INTRAVENOUS

## 2015-12-02 MED ORDER — PHENYLEPHRINE HCL 10 MG/ML IJ SOLN
INTRAMUSCULAR | Status: AC
  Filled 2015-12-02: qty 1

## 2015-12-02 MED ORDER — ONDANSETRON HCL 4 MG/2ML IJ SOLN
INTRAMUSCULAR | Status: AC
  Filled 2015-12-02: qty 2

## 2015-12-02 MED ORDER — COLLAGENASE 250 UNIT/GM EX OINT
TOPICAL_OINTMENT | Freq: Every day | CUTANEOUS | Status: DC
  Administered 2015-12-07: 12:00:00 via TOPICAL
  Administered 2015-12-08 – 2015-12-09 (×2): 1 via TOPICAL
  Filled 2015-12-02 (×2): qty 30

## 2015-12-02 MED ORDER — HYDROMORPHONE HCL 1 MG/ML IJ SOLN
1.0000 mg | Freq: Once | INTRAMUSCULAR | Status: AC
  Administered 2015-12-02: 1 mg via INTRAVENOUS
  Filled 2015-12-02: qty 1

## 2015-12-02 MED ORDER — PANCRELIPASE (LIP-PROT-AMYL) 12000-38000 UNITS PO CPEP
36000.0000 [IU] | ORAL_CAPSULE | Freq: Three times a day (TID) | ORAL | Status: DC
Start: 2015-12-02 — End: 2015-12-09
  Administered 2015-12-07 – 2015-12-09 (×8): 36000 [IU] via ORAL
  Filled 2015-12-02 (×22): qty 3

## 2015-12-02 MED ORDER — SUGAMMADEX SODIUM 200 MG/2ML IV SOLN
INTRAVENOUS | Status: AC
  Filled 2015-12-02: qty 2

## 2015-12-02 MED ORDER — LACTATED RINGERS IV SOLN
INTRAVENOUS | Status: DC | PRN
  Administered 2015-12-02: 15:00:00 via INTRAVENOUS

## 2015-12-02 MED ORDER — LIDOCAINE HCL (CARDIAC) 20 MG/ML IV SOLN
INTRAVENOUS | Status: AC
  Filled 2015-12-02: qty 5

## 2015-12-02 MED ORDER — LIDOCAINE HCL (CARDIAC) 20 MG/ML IV SOLN
INTRAVENOUS | Status: DC | PRN
  Administered 2015-12-02: 50 mg via INTRATRACHEAL

## 2015-12-02 NOTE — Anesthesia Postprocedure Evaluation (Signed)
Anesthesia Post Note  Patient: Donald Steele  Procedure(s) Performed: Procedure(s) (LRB): ENDOSCOPIC RETROGRADE CHOLANGIOPANCREATOGRAPHY (ERCP) (N/A)  Patient location during evaluation: PACU Anesthesia Type: General Level of consciousness: awake and alert Pain management: pain level controlled Vital Signs Assessment: post-procedure vital signs reviewed and stable Respiratory status: spontaneous breathing, nonlabored ventilation, respiratory function stable and patient connected to nasal cannula oxygen Cardiovascular status: blood pressure returned to baseline and stable Postop Assessment: no signs of nausea or vomiting Anesthetic complications: no    Last Vitals:  Vitals:   12/02/15 1656 12/02/15 1750  BP: (!) 194/77 (!) 147/73  Pulse: 71 74  Resp: 13 18  Temp: 36.8 C 36.8 C    Last Pain:  Vitals:   12/02/15 1750  TempSrc: Oral  PainSc:                  Reginal Lutes

## 2015-12-02 NOTE — Interval H&P Note (Signed)
History and Physical Interval Note:  12/02/2015 2:35 PM  Donald Steele  has presented today for surgery, with the diagnosis of Obstructive jaundice  The various methods of treatment have been discussed with the patient and family. After consideration of risks, benefits and other options for treatment, the patient has consented to  Procedure(s): ENDOSCOPIC RETROGRADE CHOLANGIOPANCREATOGRAPHY (ERCP) (N/A) as a surgical intervention .  The patient's history has been reviewed, patient examined, no change in status, stable for surgery.  I have reviewed the patient's chart and labs.  Questions were answered to the patient's satisfaction.     [name] D

## 2015-12-02 NOTE — Anesthesia Preprocedure Evaluation (Addendum)
Anesthesia Evaluation  Patient identified by MRN, date of birth, ID band  Reviewed: Allergy & Precautions, H&P , NPO status , Patient's Chart, lab work & pertinent test results, reviewed documented beta blocker date and time   Airway Mallampati: II  TM Distance: >3 FB Neck ROM: Full    Dental no notable dental hx. (+) Teeth Intact, Dental Advisory Given   Pulmonary neg pulmonary ROS, former smoker,    Pulmonary exam normal breath sounds clear to auscultation       Cardiovascular + CAD, + Peripheral Vascular Disease and +CHF   Rhythm:Regular Rate:Normal  - Left ventricle: The cavity size was normal. Systolic function was   moderately reduced. The estimated ejection fraction was in the   range of 35% to 40%. Moderate diffuse hypokinesis. There is   akinesis of the basalinferoseptal myocardium. There is akinesis   of the basal-mid myocardium. There is akinesis of the   entireinferolateral myocardium. There was an increased relative   contribution of atrial contraction to ventricular filling.   Doppler parameters are consistent with abnormal left ventricular   relaxation (grade 1 diastolic dysfunction). - Aortic valve: Trileaflet; mildly thickened, mildly calcified   leaflets. - Mitral valve: There was mild regurgitation. - Tricuspid valve: There was trivial regurgitation.    Neuro/Psych negative neurological ROS  negative psych ROS   GI/Hepatic negative GI ROS, Neg liver ROS,   Endo/Other  diabetes, Insulin Dependent  Renal/GU negative Renal ROS  negative genitourinary   Musculoskeletal   Abdominal   Peds  Hematology negative hematology ROS (+)   Anesthesia Other Findings   Reproductive/Obstetrics negative OB ROS                           Anesthesia Physical Anesthesia Plan  ASA: III  Anesthesia Plan: General   Post-op Pain Management:    Induction: Intravenous  Airway Management  Planned: Oral ETT  Additional Equipment:   Intra-op Plan:   Post-operative Plan: Extubation in OR  Informed Consent: I have reviewed the patients History and Physical, chart, labs and discussed the procedure including the risks, benefits and alternatives for the proposed anesthesia with the patient or authorized representative who has indicated his/her understanding and acceptance.   Dental advisory given  Plan Discussed with: CRNA  Anesthesia Plan Comments:         Anesthesia Quick Evaluation

## 2015-12-02 NOTE — Progress Notes (Signed)
PROGRESS NOTE    Donald Steele  YSA:[id number] DOB: [birth year] DOA: 12/01/2015 PCP: Crisoforo Oxford, PA-C    Brief Narrative: HPI: Donald Steele is a 55 y.o. male with medical history significant of ischemic cardiomyopathy last EF 35 to 40 % 2-17,s /p R Fem-Tib bypass. , DM, PVD, L subclavian artery stenting.07/07/15 , he relates weight loss since February, more than 60 pounds, diarrhea and decrease appetite. Report abdominal pain, sometimes after meals.  He relates that he has not been taking his plavix for a week now due to nausea. He has not ben taking most of his medications.   presents for evaluation of pancreatic mass, obstructive jaundice. Sodium at 131, glucose 469, Alk phosphatase 740, albumin 3.3, lipase 13, AST 123, ALT 164, bili 14. CT abdomen pelvis done 8-16; 1. Poorly defined hypodense mass at the pancreatic head, measuring approximately 3.1 x 3.9 x 2.9 cm, highly concerning for primary pancreatic neoplasm. Associated mild pancreatic and biliary ductal dilatation as above. Hypodense lesions at the central aspect of the liver, somewhat indeterminate, and may reflect focally dilated biliary ducts. Possible metastases not entirely excluded. Single small 4 mm nodular density within the superior segment of the right lower lobe, indeterminate. Attention at follow-up recommended. Prominent coronary artery and aorto bi-iliac atherosclerotic disease.   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  Continue to hold plavix aspirin for stent placement.   2-CAD, Cardiomyopathy;  Hold ACE for procedure.  continue with metoprolol.  Resume plavix and aspirin pot procedure when ok by Dr Benson Norway  Lasix.   3-Pancreatic insufficiency; start pancreatic enzymes post procedure.   4-Diabetes; uncontrolled, hyperglycemia; continue with Insulin. SSI.   5-oral candidiasis; nystatin orederd.   6-left fifth toe ulcer; MRI ; 5 th toe with osteomyelitis vs bone marrow edema. Will discussed with ID antibiotics options.  Patient was prescribe Doxy, will continue.  Multiple ulcers LE; wound care consulted.      DVT prophylaxis: scd Code Status: full code.  Family Communication: care discussed with patient.  Disposition Plan: home in 24 hours.   Consultants:   Dr Benson Norway    Procedures:   ERCP   Antimicrobials:  Doxycycline    Subjective: Denies worsening abdominal pain.  Awaiting procedure   Objective: Vitals:   12/01/15 1450 12/01/15 1900 12/02/15 0555  BP: 124/67 (!) 159/70 124/71  Pulse: 79 74 (!) 56  Resp: 16 (!) 22 18  Temp: 98.1 F (36.7 C) 98.1 F (36.7 C) 97.9 F (36.6 C)  TempSrc: Oral Oral Oral  SpO2: 99% 100% 100%    Intake/Output Summary (Last 24 hours) at 12/02/15 0937 Last data filed at 12/02/15 0054  Gross per 24 hour  Intake                0 ml  Output                0 ml  Net                0 ml  There were no vitals filed for this visit.  Examination:  General exam: Appears calm and comfortable , icteric.  Respiratory system: Clear to auscultation. Respiratory effort normal. Cardiovascular system: S1 & S2 heard, RRR. No JVD, murmurs, rubs, gallops or clicks. No pedal edema. Gastrointestinal system: Abdomen is nondistended, soft and nontender. No organomegaly or masses felt. Normal bowel sounds heard. Central nervous system: Alert and oriented. No focal neurological deficits. Extremities: Symmetric 5 x 5 power. Skin: multiples wound.  Left fifth toe with ulceration, mild drainage.  Psychiatry: Judgement and insight appear normal. Mood & affect appropriate.     Data Reviewed: I have personally reviewed following labs and imaging studies  CBC:  Recent Labs Lab 12/01/15 1621 12/02/15 0409  WBC 7.3 7.6  HGB 11.8* 11.4*  HCT 35.5* 34.8*  MCV 84.5 84.9  PLT 365 889   Basic Metabolic Panel:  Recent Labs Lab 12/01/15 1621 12/02/15 0409  NA 131* 136  K 3.7 4.4  CL 95* 101  CO2 25 27  GLUCOSE 469* 202*  BUN 10 7  CREATININE 0.60* 0.46*  CALCIUM 9.3 9.5   GFR: Estimated Creatinine Clearance: 111.1 mL/min (by C-G formula based on SCr of 0.8 mg/dL). Liver Function Tests:  Recent Labs Lab 11/29/15 1230 12/01/15 1621 12/02/15 0409  AST 116* 123* 107*  ALT 194* 164* 154*  ALKPHOS 896* 740* 704*  BILITOT 13.9* 14.3* 13.3*  PROT 7.4 7.7 7.3  ALBUMIN 3.9 3.3* 3.1*    Recent Labs Lab 12/01/15 1621  LIPASE 13   No results for input(s): AMMONIA in the last 168 hours. Coagulation Profile:  Recent Labs Lab 12/02/15 0409  INR 1.18   Cardiac Enzymes: No results for input(s): CKTOTAL, CKMB, CKMBINDEX, TROPONINI in the last 168 hours. BNP (last 3 results) No results for input(s): PROBNP in the last 8760 hours. HbA1C: No results for input(s): HGBA1C in the last 72 hours. CBG:  Recent Labs Lab 12/01/15 2209 12/02/15 0819  GLUCAP 245* 187*   Lipid Profile: No results for input(s): CHOL, HDL, LDLCALC, TRIG, CHOLHDL, LDLDIRECT in the last 72 hours. Thyroid Function Tests:  Recent Labs  11/29/15 1131  TSH 0.53  FREET4 1.7   Anemia Panel:  Recent Labs  11/29/15 1230  FERRITIN 645*  TIBC 339  IRON 59   Sepsis Labs: No results for input(s): PROCALCITON, LATICACIDVEN in the last 168 hours.  No results found for this or any previous visit (from the past 240 hour(s)).       Radiology Studies: Ct Chest W Contrast  Result Date: 11/30/2015 CLINICAL DATA:  Initial evaluation for  weight loss, elevated liver function tests, jaundice. EXAM: CT CHEST, ABDOMEN, AND PELVIS WITH CONTRAST TECHNIQUE: Multidetector CT imaging of the chest, abdomen and pelvis was performed following the standard protocol during bolus administration of intravenous contrast. CONTRAST:  147m ISOVUE-300 IOPAMIDOL (ISOVUE-300) INJECTION 61% COMPARISON:  None available. FINDINGS: CT CHEST FINDINGS Few scattered subcentimeter hypodense nodules partially visualize within the thyroid gland, of doubtful clinical significance. Visualized thyroid otherwise unremarkable. No pathologically enlarged mediastinal, hilar, or axillary lymph nodes identified. Intrathoracic aorta of normal caliber without acute abnormality. Scattered calcified plaque within the aortic arch in at the origin of the great vessels. Vascular stent in place at the proximal left subclavian artery. Flow within the stent appears to be patent. No high-grade stenosis at the origin of the great vessels. Heart size within normal limits. Prominent coronary artery calcifications noted, most evident within the LAD. No pericardial effusion. Limited evaluation the pulmonary  arteries grossly unremarkable. The lungs are clear without focal infiltrate, pulmonary edema, or pleural effusion. No pneumothorax. Single 4 mm nodular density noted within the superior segment of the right lower lobe (series 4, image 63), indeterminate. Additional 2 mm nodular density along the right major fissure (series 4, image 53), most consistent with a small intra pulmonic lymph node. No other worrisome pulmonary nodule or mass identified. No acute osseous abnormality. No worrisome lytic or blastic osseous lesions. CT ABDOMEN PELVIS FINDINGS Few scattered cystic lucencies noted within the right hepatic lobe near the gallbladder fossa, largest of which measures 1.5 x 1.7 cm (series 2, image 55). These are indeterminate, and may in reflect dilated biliary ducts. Liver otherwise unremarkable with  no other focal intrahepatic lesions identified. Gallbladder somewhat hydropic in appearance without acute inflammatory changes. Common bile duct mildly dilated at 8 mm. Mild intrahepatic biliary dilatation at the hilum of the liver. No cholelithiasis or choledocholithiasis identified. Spleen and adrenal glands within normal limits. There is a somewhat ill-defined hypodense mass centered at the pancreatic head which measures approximately 3.1 x 3.9 x 2.9 cm, highly concerning for primary pancreatic neoplasm. Pancreas at this level is somewhat ill-defined which shaggy margins. There is dilatation of the pancreatic duct distally up to 4 mm. Remainder the pancreatic body and tail are somewhat atrophic in appearance. Mass closely approximates the gastric antrum/pyloric region anteriorly as well as the duodenum laterally. Fat about the celiac axis and SMA is well preserved without frank vascular encasement. No significant peripancreatic adenopathy. Kidneys are equal in size with symmetric enhancement. No nephrolithiasis, hydronephrosis, or focal enhancing renal mass. Stomach within normal limits. No evidence for bowel obstruction. No abnormal wall thickening, mucosal enhancement, or inflammatory fat stranding seen about the bowels. Appendix well visualized in the right lower quadrant and is normal caliber and appearance without associated inflammatory changes suggest acute appendicitis. Moderate amount of retained stool within the colon. Partially distended bladder within normal limits without acute abnormality. Prostate within normal limits. No free intraperitoneal air. No free fluid. No pathologically enlarged intra-abdominal or pelvic lymph nodes. Postsurgical changes noted within the right groin. Advanced aorto bi-iliac atherosclerotic disease. No aneurysm. No acute osseous abnormality. No worrisome lytic or blastic osseous lesions. IMPRESSION: 1. Poorly defined hypodense mass at the pancreatic head, measuring  approximately 3.1 x 3.9 x 2.9 cm, highly concerning for primary pancreatic neoplasm. Associated mild pancreatic and biliary ductal dilatation as above. 2. Hypodense lesions at the central aspect of the liver, somewhat indeterminate, and may reflect focally dilated biliary ducts. Possible metastases not entirely excluded. 3. Single small 4 mm nodular density within the superior segment of the right lower lobe, indeterminate. Attention at follow-up recommended. 4. Prominent coronary artery and aorto bi-iliac atherosclerotic disease. Electronically Signed   By: Jeannine Boga M.D.   On: 11/30/2015 17:02   Ct Abdomen Pelvis W Contrast  Result Date: 11/30/2015 CLINICAL DATA:  Initial evaluation for weight loss, elevated liver function tests, jaundice. EXAM: CT CHEST, ABDOMEN, AND PELVIS WITH CONTRAST TECHNIQUE: Multidetector CT imaging of the chest, abdomen and pelvis was performed following the standard protocol during bolus administration of intravenous contrast. CONTRAST:  145m ISOVUE-300 IOPAMIDOL (ISOVUE-300) INJECTION 61% COMPARISON:  None available. FINDINGS: CT CHEST FINDINGS Few scattered subcentimeter hypodense nodules partially visualize within the thyroid gland, of doubtful clinical significance. Visualized thyroid otherwise unremarkable. No pathologically enlarged mediastinal, hilar, or axillary lymph nodes identified. Intrathoracic aorta of normal caliber without acute abnormality. Scattered calcified plaque within the aortic arch in at  the origin of the great vessels. Vascular stent in place at the proximal left subclavian artery. Flow within the stent appears to be patent. No high-grade stenosis at the origin of the great vessels. Heart size within normal limits. Prominent coronary artery calcifications noted, most evident within the LAD. No pericardial effusion. Limited evaluation the pulmonary arteries grossly unremarkable. The lungs are clear without focal infiltrate, pulmonary edema, or  pleural effusion. No pneumothorax. Single 4 mm nodular density noted within the superior segment of the right lower lobe (series 4, image 63), indeterminate. Additional 2 mm nodular density along the right major fissure (series 4, image 53), most consistent with a small intra pulmonic lymph node. No other worrisome pulmonary nodule or mass identified. No acute osseous abnormality. No worrisome lytic or blastic osseous lesions. CT ABDOMEN PELVIS FINDINGS Few scattered cystic lucencies noted within the right hepatic lobe near the gallbladder fossa, largest of which measures 1.5 x 1.7 cm (series 2, image 55). These are indeterminate, and may in reflect dilated biliary ducts. Liver otherwise unremarkable with no other focal intrahepatic lesions identified. Gallbladder somewhat hydropic in appearance without acute inflammatory changes. Common bile duct mildly dilated at 8 mm. Mild intrahepatic biliary dilatation at the hilum of the liver. No cholelithiasis or choledocholithiasis identified. Spleen and adrenal glands within normal limits. There is a somewhat ill-defined hypodense mass centered at the pancreatic head which measures approximately 3.1 x 3.9 x 2.9 cm, highly concerning for primary pancreatic neoplasm. Pancreas at this level is somewhat ill-defined which shaggy margins. There is dilatation of the pancreatic duct distally up to 4 mm. Remainder the pancreatic body and tail are somewhat atrophic in appearance. Mass closely approximates the gastric antrum/pyloric region anteriorly as well as the duodenum laterally. Fat about the celiac axis and SMA is well preserved without frank vascular encasement. No significant peripancreatic adenopathy. Kidneys are equal in size with symmetric enhancement. No nephrolithiasis, hydronephrosis, or focal enhancing renal mass. Stomach within normal limits. No evidence for bowel obstruction. No abnormal wall thickening, mucosal enhancement, or inflammatory fat stranding seen about  the bowels. Appendix well visualized in the right lower quadrant and is normal caliber and appearance without associated inflammatory changes suggest acute appendicitis. Moderate amount of retained stool within the colon. Partially distended bladder within normal limits without acute abnormality. Prostate within normal limits. No free intraperitoneal air. No free fluid. No pathologically enlarged intra-abdominal or pelvic lymph nodes. Postsurgical changes noted within the right groin. Advanced aorto bi-iliac atherosclerotic disease. No aneurysm. No acute osseous abnormality. No worrisome lytic or blastic osseous lesions. IMPRESSION: 1. Poorly defined hypodense mass at the pancreatic head, measuring approximately 3.1 x 3.9 x 2.9 cm, highly concerning for primary pancreatic neoplasm. Associated mild pancreatic and biliary ductal dilatation as above. 2. Hypodense lesions at the central aspect of the liver, somewhat indeterminate, and may reflect focally dilated biliary ducts. Possible metastases not entirely excluded. 3. Single small 4 mm nodular density within the superior segment of the right lower lobe, indeterminate. Attention at follow-up recommended. 4. Prominent coronary artery and aorto bi-iliac atherosclerotic disease. Electronically Signed   By: Jeannine Boga M.D.   On: 11/30/2015 17:02   Mr Foot Left Wo Contrast  Result Date: 12/02/2015 CLINICAL DATA:  Pt's wounds on his left leg and left small toe were rewrapped. The area has yellowish green drainage and a foul smell. Pt states he has not changed his bandage since Tuesday when the wound care center changed her bandage . History of diabetes. EXAM: MRI  OF THE LEFT FOOT WITHOUT CONTRAST TECHNIQUE: Multiplanar, multisequence MR imaging was performed. No intravenous contrast was administered. COMPARISON:  Left foot x-rays 11/22/2015 FINDINGS: Patient motion degrades image quality limiting evaluation. Bones/Joint/Cartilage Soft tissue ulceration along  the dorsal lateral aspect of the fifth phalanx extending down to the cortex. Mild marrow edema within the distal aspect of the fifth proximal phalanx and the base of the fifth middle phalanx without a definitive cortical destruction or periosteal reaction. No other marrow signal abnormality. No fracture or dislocation. Normal alignment. No joint effusion. Ligaments Collateral ligaments are intact. Muscles and Tendons Visualized flexor, peroneal and extensor compartment tendons are intact. Diffuse T2 hyperintense signal throughout the plantar musculature likely neurogenic. Soft tissue No fluid collection or hematoma. No soft tissue mass. Mild soft tissue edema along the dorsal aspect of the midfoot and forefoot. IMPRESSION: 1. Soft tissue ulceration along the dorsal lateral aspect of the fifth phalanx extending down to the cortex. Mild marrow edema within the distal aspect of the fifth proximal phalanx and the base of the fifth middle phalanx without a definitive cortical destruction or periosteal reaction differential considerations include osteomyelitis versus reactive marrow edema. Electronically Signed   By: Kathreen Devoid   On: 12/02/2015 08:02        Scheduled Meds: . doxycycline  100 mg Oral Q12H  . folic acid  1 mg Oral Daily  . furosemide  20 mg Oral Daily  . insulin aspart  0-9 Units Subcutaneous TID WC  . insulin glargine  10 Units Subcutaneous QHS  . metoprolol succinate  25 mg Oral Daily  . multivitamin with minerals  1 tablet Oral Daily  . nystatin  5 mL Oral QID  . pregabalin  200 mg Oral BID   Continuous Infusions: . sodium chloride 1,000 mL (12/01/15 2153)     LOS: 1 day    Time spent: 35 minutes.     Elmarie Shiley, MD [facility] Pager [phone]  If 7PM-7AM, please contact night-coverage [email or web address] Password [email or web address] 12/02/2015, 9:37 AM

## 2015-12-02 NOTE — Consult Note (Addendum)
[facility] Nurse wound consult note Reason for Consult Wound type: Chronic, non healing full thickness wounds to bilateral lower extremities. Followed in Wound Clinic by Dr. Elesa Hacker. Patient should follow up with wound care center at discharge. Pressure Ulcer POA: no Measurement:  Left 5th toe: 2.5cm x 2cm, unable to determine depth due to necrotic tissue. Wound bed 20% red 80% yellow slough, minimal light yellow exudate, malodorous.  Right dorsal foot: 1cm x 1.25cm x 0.1cm. Wound bed 95% red, 5% yellow slough, minimal serous exudate, no odor noted. Left pre-tibial: 4.5cm x 3cm, unable to determine depth due to necrotic tissue. 40% red, 60% yellow slough, moderate amt light yellow exudate, malodorous. Wound bed: as above Drainage (amount, consistency, odor) as above Periwound: intact, mild maceration noted at left pre-tibial wound Dressing procedure/placement/frequency: Orders provided for nursing: Right dorsal foot will receive Xeroform dressing daily, left pre-tibial and left 5th toe to receive Santyl and saline moistened gauze daily. We will not follow, but will remain available to this patient, nursing, and medical and surgical teams.   Fara Olden, RN-C, WTA-C Wound Treatment Associate

## 2015-12-02 NOTE — Progress Notes (Addendum)
Inpatient Diabetes Program Recommendations  AACE/ADA: New Consensus Statement on Inpatient Glycemic Control (2015)  Target Ranges:  Prepandial:   less than 140 mg/dL      Peak postprandial:   less than 180 mg/dL (1-2 hours)      Critically ill patients:  140 - 180 mg/dL   Results for Donald Steele, Donald Steele (MRN [id number]) as of 12/02/2015 09:35  Ref. Range 12/01/2015 22:09 12/02/2015 08:19  Glucose-Capillary Latest Ref Range: 65 - 99 mg/dL 245 (H) 187 (H)   Results for Donald Steele, Donald Steele (MRN [id number]) as of 12/02/2015 09:35  Ref. Range 11/15/2015 11:03  Hemoglobin A1C Latest Ref Range: <5.7 % 11.9 (H)    Admit with: Pancreatic Mass/ Obstructive Jaundice  History: DM  Home DM Meds: Tyler Aas Insulin 10 units QHS  Current Insulin Orders: Lantus 10 units QHS       Novolog Sensitive Correction Scale/ SSI (0-9 units) TID AC      -Note patient saw his PCP (Dr. Glade Lloyd with Family Medicine) on 11/29/15.  At that visit, Metformin and Farxiga oral DM medications were stopped and patient was shown howto use Tresiba insulin pen.  Started on Tresiba insulin 10 units QHS at that visit.  -Note Lantus 10 units QHS started last PM.  AM CBG today: 187 mg/dl.     MD- Please consider changing Novolog Sensitive Correction Scale/ SSI (0-9 units) to Q4 hour coverage since patient currently NPO (currently ordered TID AC)      --Will follow patient during hospitalization--  Wyn Quaker RN, MSN, CDE Diabetes Coordinator Inpatient Glycemic Control Team Team Pager: [phone] (8a-5p)

## 2015-12-02 NOTE — Progress Notes (Signed)
Pt vomited 20 cc of greenish yellowish emesis. MD notified, orders received for clear liquid diet

## 2015-12-02 NOTE — Progress Notes (Signed)
Nursing Note: Girlfriend at the bedside and dressed wounds on legs and  L shin,L toe .She dresses them at home per pt.wbb

## 2015-12-02 NOTE — Op Note (Signed)
[facility] Patient Name: Donald Steele Procedure Date: 12/02/2015 MRN: [id number] Attending MD: Carol Ada , MD Date of Birth: 04-27-1960 CSN: [id number] Age: 55 Admit Type: Inpatient Procedure:                ERCP Indications:              Malignant tumor of the head of pancreas Providers:                Carol Ada, MD, Cleda Daub, RN, Ralene Bathe,                            Technician, Arnoldo Hooker, CRNA Referring MD:              Medicines:                General Anesthesia Complications:            No immediate complications. Estimated Blood Loss:     Estimated blood loss: none. Procedure:                Pre-Anesthesia Assessment:                           - Prior to the procedure, a History and Physical                            was performed, and patient medications and                            allergies were reviewed. The patient's tolerance of                            previous anesthesia was also reviewed. The risks                            and benefits of the procedure and the sedation                            options and risks were discussed with the patient.                            All questions were answered, and informed consent                            was obtained. Prior Anticoagulants: The patient has                            taken Plavix (clopidogrel), last dose was 1 day                            prior to procedure. ASA Grade Assessment: III - A                            patient with severe systemic disease. After  reviewing the risks and benefits, the patient was                            deemed in satisfactory condition to undergo the                            procedure.                           - Sedation was administered by an anesthesia                            professional. General anesthesia was attained.                           After obtaining informed consent, the scope was                  passed under direct vision. Throughout the                            procedure, the patient's blood pressure, pulse, and                            oxygen saturations were monitored continuously. The                            [id number]) scope was introduced through                            the mouth, and used to inject contrast into and                            used to inject contrast into the dorsal pancreatic                            duct. The ERCP was extremely difficult due to                            challenging cannulation because of abnormal                            anatomy. The patient tolerated the procedure well. Scope In: Scope Out: Findings:      A 15 mm ventral pancreatic sphincterotomy was made with a monofilament       traction (standard) sphincterotome using ERBE electrocautery. There was       no post-sphincterotomy bleeding.      This procedure was extremely difficult. There was anatomy distortion       secondary to the pancreatic head mass. Ulcerations and friability of the       duodenal bulb was identified. The ampulla was very large and no       spontaneous drainage of bile was noted. Positioning was very difficult,       but the PD was able to be cannulated. This allowed for a pancreatic       sphincterotomy in hopes of  uncovering the CBD, unfortunately, this did       not occur. The CBD was not visible. After multiple attempts the       procedure was terminated. Impression:               - A pancreatic sphincterotomy was performed. Moderate Sedation:      N/A- Per Anesthesia Care Recommendation:           - Return patient to hospital ward for ongoing care.                           - Consult IR for stent placement. Procedure Code(s):        --- Professional ---                           [phone], Endoscopic retrograde                            cholangiopancreatography (ERCP); with                             sphincterotomy/papillotomy Diagnosis Code(s):        --- Professional ---                           C25.0, Malignant neoplasm of head of pancreas CPT copyright 2016 [employer]. All rights reserved. The codes documented in this report are preliminary and upon coder review may  be revised to meet current compliance requirements. Carol Ada, MD Carol Ada, MD 12/02/2015 4:49:44 PM This report has been signed electronically. Number of Addenda: 0

## 2015-12-02 NOTE — Progress Notes (Signed)
Initial Nutrition Assessment  DOCUMENTATION CODES:   Severe malnutrition in context of chronic illness  INTERVENTION:  -Glucerna Shake po TID, each supplement provides 220 kcal and 10 grams of protein with diet advancement -RD to continue to monitor  NUTRITION DIAGNOSIS:   Malnutrition related to chronic illness as evidenced by severe depletion of body fat, severe depletion of muscle mass, moderate depletion of body fat, percent weight loss.  GOAL:   Patient will meet greater than or equal to 90% of their needs  MONITOR:   Skin, I & O's, Labs, Weight trends, Diet advancement, PO intake  REASON FOR ASSESSMENT:   Malnutrition Screening Tool   ASSESSMENT:   Donald Steele is a 55 y.o. male with medical history significant of ischemic cardiomyopathy last EF 35 to 40 % 2-17,s /p R Fem-Tib bypass. , DM, PVD, L subclavian artery stenting. 07/07/15 , he relates weight loss since February, more than 60 pounds, diarrhea and decrease appetite. Report abdominal pain, sometimes after meals.   Spoke with Donald Steele at bedside. He endorses a 60# wt loss since February, PO intake of 1 small meal/day for about 8 months. States he normally eats grits or oatmeal once/day but otherwise doesn't have much of an appetite. Endorses nausea/vomiting whenever he takes his medication, which continues today. He also endorses taste alterations going for approximately 5 months. States that he had "bad teeth" that were removed by his dentist, which were preventing him from eating and also had an impact on taste alterations. States "the dentist told me it would take a while for my taste to come back." He also states he stopped smoking 6 months ago. Pancreatic mass with possible malignancy is likely cause of continued taste alterations. Multiple wounds related to uncontrolled DM could be cause of poor appetite.  Reviewing his chart, he was 174# as of 05/2015 Appears he gained a significant amount of weight with his  cardiac surgeries, 198 as of 08/2015 Now he is was 169# a 29#/15% severe wt loss in 3 months.  Labs and Medications reviewed: CBGs 174-245, Tot Bili 13.3, LFTs elevated Folic Acid, MV with Minerals NS @ 52mL/hr   Diet Order:  Diet NPO time specified  Skin:  Wound (see comment) (Multiple wounds to toe, leg)  Last BM:  11/30/2015  Height:   Ht Readings from Last 1 Encounters:  09/08/15 5\' 11"  (1.803 m)    Weight:   Wt Readings from Last 1 Encounters:  12/01/15 169 lb (76.7 kg)    Ideal Body Weight:  78.18 kg  BMI:  23.6 Based on Previous HT  Estimated Nutritional Needs:   Kcal:  1900-2200 calories  Protein:  91-114 grams  Fluid:  >/= 1.9L  EDUCATION NEEDS:   No education needs identified at this time  Donald Steele. Donald Venturi, MS, RD LDN Inpatient Clinical Dietitian Pager [phone]

## 2015-12-02 NOTE — Anesthesia Procedure Notes (Signed)
Procedure Name: Intubation Date/Time: 12/02/2015 3:19 PM Performed by: Quintel Mccalla, Virgel Gess Pre-anesthesia Checklist: Patient identified, Emergency Drugs available, Suction available, Patient being monitored and Timeout performed Patient Re-evaluated:Patient Re-evaluated prior to inductionOxygen Delivery Method: Circle system utilized Preoxygenation: Pre-oxygenation with 100% oxygen Intubation Type: IV induction Ventilation: Mask ventilation without difficulty Laryngoscope Size: Mac and 4 Grade View: Grade II Tube type: Oral Tube size: 7.5 mm Number of attempts: 1 Airway Equipment and Method: Stylet Placement Confirmation: ETT inserted through vocal cords under direct vision,  positive ETCO2,  CO2 detector and breath sounds checked- equal and bilateral Secured at: 22 cm Tube secured with: Tape Dental Injury: Teeth and Oropharynx as per pre-operative assessment

## 2015-12-02 NOTE — Transfer of Care (Signed)
Immediate Anesthesia Transfer of Care Note  Patient: Donald Steele  Procedure(s) Performed: Procedure(s): ENDOSCOPIC RETROGRADE CHOLANGIOPANCREATOGRAPHY (ERCP) (N/A)  Patient Location: PACU  Anesthesia Type:General  Level of Consciousness:  sedated, patient cooperative and responds to stimulation  Airway & Oxygen Therapy:Patient Spontanous Breathing and Patient connected to face mask oxgen  Post-op Assessment:  Report given to PACU RN and Post -op Vital signs reviewed and stable  Post vital signs:  Reviewed and stable  Last Vitals:  Vitals:   12/02/15 0555 12/02/15 1440  BP: 124/71 (!) 172/69  Pulse: (!) 56 61  Resp: 18   Temp: 123XX123 C     Complications: No apparent anesthesia complications

## 2015-12-02 NOTE — H&P (View-Only) (Signed)
Reason for Consult: Obstructive jaundice Referring Physician: [facility]  [place] HPI: This is a 55 year old male with a PMH of CAD, hyperlipidemia, DM, and PVD admitted for obstructive jaundice.  The patient recalls symptoms that started in February this year with diarrhea.  His diarrhea is oily and as a result he has lost weight.  The patient reports that his weight dropped from 235 lbs down to 170 lbs, but it seemed to accelerate over the past couple of months.  It was during this time that he also felt tired, nauseous, anorexic, and difficult to control diabetes.  At first he thought that his symptoms were secondary his poor dentition and his inability to tolerate PO, but he had his top dentition removed last week.  He reports feeling unwell for the past several days and he was noted by his PCP that he had an increase in his liver enzymes.  In fact, on 11/15/2015 he was found to have a TB of 5.7 and AP at 485.  On 11/29/2015, the follow up liver panel revealed a worsening of his values:  TB 13.9 and AP 896.  A CT scan was performed and a 3.1 x 3.9 x 2.9 cm ill-defined pancreatic head mass was found.  The CBD is measured to be 8 mm, but there is a mild intrahepatic biliary ductal dilation.  No obvious mets noted on the CT scan.    Past Medical History:  Diagnosis Date  . Cardiomyopathy ([place])    a. Echo 2/17 - EF 35-40%, mod diff HK, inf-septal, inf-lat AK, Gr 1 DD, mild MR  . Carotid stenosis    a. Carotid US 2/17 - bilat ICA 1-39%  . Chronic combined systolic and diastolic CHF (congestive heart failure) ([place])   . Diabetes mellitus   . History of nuclear stress test    a. Nuc study 2/17 - Myocardial perfusion is abnormal. Findings consistent with prior myocardial infarction (inf, inf-lat, apical inf, apical lateral) with peri-infarct ischemia in apical inferolateral and apical lateral regions. This is a high risk study. Overall left ventricular systolic function was abnormal. LV cavity  size is severely enlarged. Nuclear stress EF: 29%.   . Hyperlipidemia   . Peripheral vascular disease due to secondary diabetes mellitus ([place])    a. s/p R Fem-Tibial bypass 2/17    Past Surgical History:  Procedure Laterality Date  . CARDIAC CATHETERIZATION N/A 06/30/2015   Procedure: Left Heart Cath and Coronary Angiography;  Surgeon: Donald Booze, MD;  Location: [place] [facility];  Service: Cardiovascular;  Laterality: N/A;  . ENDARTERECTOMY FEMORAL Right 06/02/2015   Procedure: ENDARTERECTOMY RIGHT PROFUNDA FEMORIS ARTERY;  Surgeon: Donald Misty, MD;  Location: [place];  Service: Vascular;  Laterality: Right;  . FEMORAL-TIBIAL BYPASS GRAFT Right 06/02/2015   Procedure: RIGHT PROFUNDA FEMORIS TO POSTERIOR TIBIAL ARTERY BYPASS USING RIGHT NON-REVERSED GREATER SAPHENOUS VEIN,  SUBCUTANEOUS TUNNEL;  Surgeon: Donald Misty, MD;  Location: [place];  Service: Vascular;  Laterality: Right;  . INTRAOPERATIVE ARTERIOGRAM Right 06/02/2015   Procedure: INTRA OPERATIVE ARTERIOGRAM RIGHT LOWER LEG;  Surgeon: Donald Misty, MD;  Location: [place];  Service: Vascular;  Laterality: Right;  . No history of Surgery     . PERIPHERAL VASCULAR CATHETERIZATION N/A 06/01/2015   Procedure: Abdominal Aortogram;  Surgeon: Donald Mitchell, MD;  Location: [place] [facility];  Service: Cardiovascular;  Laterality: N/A;  . PERIPHERAL VASCULAR CATHETERIZATION N/A 07/07/2015   Procedure: Aortic Arch Angiography;  Surgeon: Donald Edelman  Starlyn Skeans, MD;  Location: [place] [facility];  Service: Cardiovascular;  Laterality: N/A;  . PERIPHERAL VASCULAR CATHETERIZATION N/A 07/07/2015   Procedure: Upper Extremity Angiography;  Surgeon: Donald Elmira, MD;  Location: [place] [facility];  Service: Cardiovascular;  Laterality: N/A;  . PERIPHERAL VASCULAR CATHETERIZATION Left 07/07/2015   Procedure: Peripheral Vascular Intervention;  Surgeon: Donald [place], MD;  Location: [place] [facility];  Service: Cardiovascular;  Laterality: Left;  left  subclavian and axillary  . VEIN HARVEST Right 06/02/2015   Procedure: VEIN HARVEST RIGHT GREATER SAPHENOUS VEIN;  Surgeon: Donald Misty, MD;  Location: [place];  Service: Vascular;  Laterality: Right;    Family History  Problem Relation Age of Onset  . Heart attack Neg Hx     Social History:  reports that he has quit smoking. He has a 35.00 pack-year smoking history. He has never used smokeless tobacco. He reports that he does not drink alcohol or use drugs.  Allergies: No Known Allergies  Medications: Scheduled: Continuous:  No results found for this or any previous visit (from the past 24 hour(s)).   Ct Chest W Contrast  Result Date: 11/30/2015 CLINICAL DATA:  Initial evaluation for weight loss, elevated liver function tests, jaundice. EXAM: CT CHEST, ABDOMEN, AND PELVIS WITH CONTRAST TECHNIQUE: Multidetector CT imaging of the chest, abdomen and pelvis was performed following the standard protocol during bolus administration of intravenous contrast. CONTRAST:  140mL ISOVUE-300 IOPAMIDOL (ISOVUE-300) INJECTION 61% COMPARISON:  None available. FINDINGS: CT CHEST FINDINGS Few scattered subcentimeter hypodense nodules partially visualize within the thyroid gland, of doubtful clinical significance. Visualized thyroid otherwise unremarkable. No pathologically enlarged mediastinal, hilar, or axillary lymph nodes identified. Intrathoracic aorta of normal caliber without acute abnormality. Scattered calcified plaque within the aortic arch in at the origin of the great vessels. Vascular stent in place at the proximal left subclavian artery. Flow within the stent appears to be patent. No high-grade stenosis at the origin of the great vessels. Heart size within normal limits. Prominent coronary artery calcifications noted, most evident within the LAD. No pericardial effusion. Limited evaluation the pulmonary arteries grossly unremarkable. The lungs are clear without focal infiltrate, pulmonary edema, or  pleural effusion. No pneumothorax. Single 4 mm nodular density noted within the superior segment of the right lower lobe (series 4, image 63), indeterminate. Additional 2 mm nodular density along the right major fissure (series 4, image 53), most consistent with a small intra pulmonic lymph node. No other worrisome pulmonary nodule or mass identified. No acute osseous abnormality. No worrisome lytic or blastic osseous lesions. CT ABDOMEN PELVIS FINDINGS Few scattered cystic lucencies noted within the right hepatic lobe near the gallbladder fossa, largest of which measures 1.5 x 1.7 cm (series 2, image 55). These are indeterminate, and may in reflect dilated biliary ducts. Liver otherwise unremarkable with no other focal intrahepatic lesions identified. Gallbladder somewhat hydropic in appearance without acute inflammatory changes. Common bile duct mildly dilated at 8 mm. Mild intrahepatic biliary dilatation at the hilum of the liver. No cholelithiasis or choledocholithiasis identified. Spleen and adrenal glands within normal limits. There is a somewhat ill-defined hypodense mass centered at the pancreatic head which measures approximately 3.1 x 3.9 x 2.9 cm, highly concerning for primary pancreatic neoplasm. Pancreas at this level is somewhat ill-defined which shaggy margins. There is dilatation of the pancreatic duct distally up to 4 mm. Remainder the pancreatic body and tail are somewhat atrophic in appearance. Mass closely approximates the gastric antrum/pyloric region anteriorly  as well as the duodenum laterally. Fat about the celiac axis and SMA is well preserved without frank vascular encasement. No significant peripancreatic adenopathy. Kidneys are equal in size with symmetric enhancement. No nephrolithiasis, hydronephrosis, or focal enhancing renal mass. Stomach within normal limits. No evidence for bowel obstruction. No abnormal wall thickening, mucosal enhancement, or inflammatory fat stranding seen about  the bowels. Appendix well visualized in the right lower quadrant and is normal caliber and appearance without associated inflammatory changes suggest acute appendicitis. Moderate amount of retained stool within the colon. Partially distended bladder within normal limits without acute abnormality. Prostate within normal limits. No free intraperitoneal air. No free fluid. No pathologically enlarged intra-abdominal or pelvic lymph nodes. Postsurgical changes noted within the right groin. Advanced aorto bi-iliac atherosclerotic disease. No aneurysm. No acute osseous abnormality. No worrisome lytic or blastic osseous lesions. IMPRESSION: 1. Poorly defined hypodense mass at the pancreatic head, measuring approximately 3.1 x 3.9 x 2.9 cm, highly concerning for primary pancreatic neoplasm. Associated mild pancreatic and biliary ductal dilatation as above. 2. Hypodense lesions at the central aspect of the liver, somewhat indeterminate, and may reflect focally dilated biliary ducts. Possible metastases not entirely excluded. 3. Single small 4 mm nodular density within the superior segment of the right lower lobe, indeterminate. Attention at follow-up recommended. 4. Prominent coronary artery and aorto bi-iliac atherosclerotic disease. Electronically Signed   By: Jeannine Boga M.D.   On: 11/30/2015 17:02   Ct Abdomen Pelvis W Contrast  Result Date: 11/30/2015 CLINICAL DATA:  Initial evaluation for weight loss, elevated liver function tests, jaundice. EXAM: CT CHEST, ABDOMEN, AND PELVIS WITH CONTRAST TECHNIQUE: Multidetector CT imaging of the chest, abdomen and pelvis was performed following the standard protocol during bolus administration of intravenous contrast. CONTRAST:  141mL ISOVUE-300 IOPAMIDOL (ISOVUE-300) INJECTION 61% COMPARISON:  None available. FINDINGS: CT CHEST FINDINGS Few scattered subcentimeter hypodense nodules partially visualize within the thyroid gland, of doubtful clinical significance.  Visualized thyroid otherwise unremarkable. No pathologically enlarged mediastinal, hilar, or axillary lymph nodes identified. Intrathoracic aorta of normal caliber without acute abnormality. Scattered calcified plaque within the aortic arch in at the origin of the great vessels. Vascular stent in place at the proximal left subclavian artery. Flow within the stent appears to be patent. No high-grade stenosis at the origin of the great vessels. Heart size within normal limits. Prominent coronary artery calcifications noted, most evident within the LAD. No pericardial effusion. Limited evaluation the pulmonary arteries grossly unremarkable. The lungs are clear without focal infiltrate, pulmonary edema, or pleural effusion. No pneumothorax. Single 4 mm nodular density noted within the superior segment of the right lower lobe (series 4, image 63), indeterminate. Additional 2 mm nodular density along the right major fissure (series 4, image 53), most consistent with a small intra pulmonic lymph node. No other worrisome pulmonary nodule or mass identified. No acute osseous abnormality. No worrisome lytic or blastic osseous lesions. CT ABDOMEN PELVIS FINDINGS Few scattered cystic lucencies noted within the right hepatic lobe near the gallbladder fossa, largest of which measures 1.5 x 1.7 cm (series 2, image 55). These are indeterminate, and may in reflect dilated biliary ducts. Liver otherwise unremarkable with no other focal intrahepatic lesions identified. Gallbladder somewhat hydropic in appearance without acute inflammatory changes. Common bile duct mildly dilated at 8 mm. Mild intrahepatic biliary dilatation at the hilum of the liver. No cholelithiasis or choledocholithiasis identified. Spleen and adrenal glands within normal limits. There is a somewhat ill-defined hypodense mass centered at the pancreatic head  which measures approximately 3.1 x 3.9 x 2.9 cm, highly concerning for primary pancreatic neoplasm. Pancreas  at this level is somewhat ill-defined which shaggy margins. There is dilatation of the pancreatic duct distally up to 4 mm. Remainder the pancreatic body and tail are somewhat atrophic in appearance. Mass closely approximates the gastric antrum/pyloric region anteriorly as well as the duodenum laterally. Fat about the celiac axis and SMA is well preserved without frank vascular encasement. No significant peripancreatic adenopathy. Kidneys are equal in size with symmetric enhancement. No nephrolithiasis, hydronephrosis, or focal enhancing renal mass. Stomach within normal limits. No evidence for bowel obstruction. No abnormal wall thickening, mucosal enhancement, or inflammatory fat stranding seen about the bowels. Appendix well visualized in the right lower quadrant and is normal caliber and appearance without associated inflammatory changes suggest acute appendicitis. Moderate amount of retained stool within the colon. Partially distended bladder within normal limits without acute abnormality. Prostate within normal limits. No free intraperitoneal air. No free fluid. No pathologically enlarged intra-abdominal or pelvic lymph nodes. Postsurgical changes noted within the right groin. Advanced aorto bi-iliac atherosclerotic disease. No aneurysm. No acute osseous abnormality. No worrisome lytic or blastic osseous lesions. IMPRESSION: 1. Poorly defined hypodense mass at the pancreatic head, measuring approximately 3.1 x 3.9 x 2.9 cm, highly concerning for primary pancreatic neoplasm. Associated mild pancreatic and biliary ductal dilatation as above. 2. Hypodense lesions at the central aspect of the liver, somewhat indeterminate, and may reflect focally dilated biliary ducts. Possible metastases not entirely excluded. 3. Single small 4 mm nodular density within the superior segment of the right lower lobe, indeterminate. Attention at follow-up recommended. 4. Prominent coronary artery and aorto bi-iliac atherosclerotic  disease. Electronically Signed   By: Jeannine Boga M.D.   On: 11/30/2015 17:02    ROS:  As stated above in the HPI otherwise negative.  Blood pressure 124/67, pulse 79, temperature 98.1 F (36.7 C), temperature source Oral, resp. rate 16, SpO2 99 %.    PE: Gen: NAD, Alert and Oriented HEENT:  [place]/AT, EOMI Neck: Supple, no LAD Lungs: CTA Bilaterally CV: RRR without M/G/R ABM: Soft, NTND, +BS Ext: No C/C/E Skin: wrinkles consistent with weight loss.  Assessment/Plan: 1) Pancreatic head mass. 2) Obstructive jaundice. 3) Weight loss. 4) Diarrhea secondary to pancreatic insufficiency.   I had a long discussion with the patient and the plan to place a stent with the ERCP and a subsequent EUS with FNA.  He has pancreatic insufficiency, which as resulted in his steatorrhea and weight loss.  Plan: 1) ERCP with stent placement. 2) EUS with FNA in the near future. 3) Check Ca 19-9. 4) Start on Creon after the ERCP tomorrow.  He needs to be NPO after midnight and he will not experience the benefit of the medication until he is back on a regular diet.  Sherryl Valido D 12/01/2015, 3:50 PM

## 2015-12-03 ENCOUNTER — Encounter (HOSPITAL_COMMUNITY): Payer: Self-pay | Admitting: Anesthesiology

## 2015-12-03 ENCOUNTER — Encounter (HOSPITAL_COMMUNITY): Payer: Self-pay

## 2015-12-03 ENCOUNTER — Encounter (HOSPITAL_COMMUNITY)
Admission: EM | Disposition: A | Discharge status: Home Health | Payer: Self-pay | Source: Home / Self Care | Attending: Internal Medicine

## 2015-12-03 ENCOUNTER — Inpatient Hospital Stay (HOSPITAL_COMMUNITY): Payer: BLUE CROSS/BLUE SHIELD

## 2015-12-03 HISTORY — PX: IR GENERIC HISTORICAL: IMG1180011

## 2015-12-03 LAB — COMPREHENSIVE METABOLIC PANEL
ALK PHOS: 600 U/L — AB (ref 38–126)
ALT: 119 U/L — AB (ref 17–63)
AST: 86 U/L — AB (ref 15–41)
Albumin: 2.6 g/dL — ABNORMAL LOW (ref 3.5–5.0)
Anion gap: 7 (ref 5–15)
BILIRUBIN TOTAL: 12.2 mg/dL — AB (ref 0.3–1.2)
BUN: 9 mg/dL (ref 6–20)
CALCIUM: 8.8 mg/dL — AB (ref 8.9–10.3)
CO2: 25 mmol/L (ref 22–32)
CREATININE: 0.64 mg/dL (ref 0.61–1.24)
Chloride: 103 mmol/L (ref 101–111)
GFR calc Af Amer: >60 mL/min (ref >60)
GLUCOSE: 334 mg/dL — AB (ref 65–99)
POTASSIUM: 4 mmol/L (ref 3.5–5.1)
Sodium: 135 mmol/L (ref 135–145)
TOTAL PROTEIN: 6.1 g/dL — AB (ref 6.5–8.1)

## 2015-12-03 LAB — GLUCOSE, CAPILLARY
GLUCOSE-CAPILLARY: 246 mg/dL — AB (ref 65–99)
GLUCOSE-CAPILLARY: 213 mg/dL — AB (ref 65–99)
Glucose-Capillary: 231 mg/dL — ABNORMAL HIGH (ref 65–99)
Glucose-Capillary: 137 mg/dL — ABNORMAL HIGH (ref 65–99)
Glucose-Capillary: 173 mg/dL — ABNORMAL HIGH (ref 65–99)

## 2015-12-03 LAB — CBC
HEMATOCRIT: 33.3 % — AB (ref 39.0–52.0)
HEMOGLOBIN: 10.8 g/dL — AB (ref 13.0–17.0)
MCH: 27.6 pg (ref 26.0–34.0)
MCHC: 32.4 g/dL (ref 30.0–36.0)
MCV: 85.2 fL (ref 78.0–100.0)
Platelets: 324 10*3/uL (ref 150–400)
RBC: 3.91 MIL/uL — ABNORMAL LOW (ref 4.22–5.81)
RDW: 15 % (ref 11.5–15.5)
WBC: 6.8 10*3/uL (ref 4.0–10.5)

## 2015-12-03 LAB — CANCER ANTIGEN 19-9: CA 19-9: 10 U/mL (ref 0–35)

## 2015-12-03 SURGERY — CANCELLED PROCEDURE

## 2015-12-03 MED ORDER — INDOMETHACIN 50 MG RE SUPP
RECTAL | Status: AC
  Filled 2015-12-03: qty 2

## 2015-12-03 MED ORDER — FENTANYL CITRATE (PF) 100 MCG/2ML IJ SOLN
INTRAMUSCULAR | Status: AC | PRN
  Administered 2015-12-03 (×4): 50 ug via INTRAVENOUS
  Administered 2015-12-03: 100 ug via INTRAVENOUS

## 2015-12-03 MED ORDER — IOPAMIDOL (ISOVUE-300) INJECTION 61%
55.0000 mL | Freq: Once | INTRAVENOUS | Status: AC | PRN
  Administered 2015-12-03: 55 mL

## 2015-12-03 MED ORDER — MIDAZOLAM HCL 2 MG/2ML IJ SOLN
INTRAMUSCULAR | Status: AC
Start: 2015-12-03 — End: 2015-12-04
  Filled 2015-12-03: qty 6

## 2015-12-03 MED ORDER — LIDOCAINE HCL 1 % IJ SOLN
INTRAMUSCULAR | Status: AC
  Filled 2015-12-03: qty 20

## 2015-12-03 MED ORDER — MIDAZOLAM HCL 2 MG/2ML IJ SOLN
INTRAMUSCULAR | Status: AC | PRN
  Administered 2015-12-03 (×4): 1 mg via INTRAVENOUS
  Administered 2015-12-03: 2 mg via INTRAVENOUS

## 2015-12-03 MED ORDER — PIPERACILLIN-TAZOBACTAM 3.375 G IVPB
3.3750 g | INTRAVENOUS | Status: AC
  Administered 2015-12-03: 3.375 g via INTRAVENOUS
  Filled 2015-12-03: qty 50

## 2015-12-03 MED ORDER — GLUCAGON HCL RDNA (DIAGNOSTIC) 1 MG IJ SOLR
INTRAMUSCULAR | Status: AC
  Filled 2015-12-03: qty 1

## 2015-12-03 MED ORDER — CIPROFLOXACIN IN D5W 400 MG/200ML IV SOLN
INTRAVENOUS | Status: AC
  Filled 2015-12-03: qty 200

## 2015-12-03 MED ORDER — FENTANYL CITRATE (PF) 100 MCG/2ML IJ SOLN
INTRAMUSCULAR | Status: AC
  Filled 2015-12-03: qty 6

## 2015-12-03 MED ORDER — TAMSULOSIN HCL 0.4 MG PO CAPS
0.4000 mg | ORAL_CAPSULE | Freq: Every day | ORAL | Status: DC
  Administered 2015-12-03 – 2015-12-09 (×6): 0.4 mg via ORAL
  Filled 2015-12-03 (×5): qty 1

## 2015-12-03 NOTE — Progress Notes (Signed)
Obtained one time order for  In and out cath.A: pt in and out cathed per policy.Observed by Barbera Setters.Pt was in and out cathed for 475 cc of tea-orange colored urine.Like a dark pyriduim colored urine.Pt tolerated well.wbb

## 2015-12-03 NOTE — Procedures (Signed)
S/p RT BILIIARY INT/EXT DRAIN INSERTION  NO COMP  STABLE  BILE SENT FOR CX  LEFT AND RIGHT DUCTS FULLY DRAINED  FULL REPORT IN PACS

## 2015-12-03 NOTE — Consult Note (Signed)
Chief Complaint: Patient was seen in consultation today for percutaneous transhepatic cholangiogram with biliary drainage Chief Complaint  Patient presents with  . Abdominal Pain    Referring Physician(s): Hung,P  Supervising Physician: Daryll Brod  Patient Status: Inpatient  History of Present Illness: Donald Steele is a 55 y.o. male with past medical history as listed below who was admitted to the hospital on 12/01/15 with obstructive jaundice. He initially noted some oily diarrhea in February of this year with progressive weight loss. He has also had fatigue, intermittent nausea, anorexia and difficulty controlling his diabetes as well as progressive elevation of his LFTs with total bilirubin currently 12.2. Subsequent imaging has revealed a poorly defined mass at the pancreatic head with associated mild pancreatic and biliary ductal dilatation. Hypodense lesions were noted in the central aspect of the liver as well as a small 4 mm nodular density within the superior segment of the right lower lobe lung. On 12/02/15 he underwent ERCP and pancreatic sphincterotomy. The procedure was extremity difficult with anatomy distortion secondary to pancreatic head mass. There were ulcerations and friability of the duodenal bulb and the ampulla was very large with no spontaneous drainage of bile noted. The common bile duct was not visualized. Request now received from GI service for PTC with biliary drainage.  Past Medical History:  Diagnosis Date  . Cardiomyopathy ([place])    a. Echo 2/17 - EF 35-40%, mod diff HK, inf-septal, inf-lat AK, Gr 1 DD, mild MR  . Carotid stenosis    a. Carotid US 2/17 - bilat ICA 1-39%  . Chronic combined systolic and diastolic CHF (congestive heart failure) ([place])   . Diabetes mellitus   . History of nuclear stress test    a. Nuc study 2/17 - Myocardial perfusion is abnormal. Findings consistent with prior myocardial infarction (inf, inf-lat, apical inf, apical  lateral) with peri-infarct ischemia in apical inferolateral and apical lateral regions. This is a high risk study. Overall left ventricular systolic function was abnormal. LV cavity size is severely enlarged. Nuclear stress EF: 29%.   . Hyperlipidemia   . Peripheral vascular disease due to secondary diabetes mellitus ([place])    a. s/p R Fem-Tibial bypass 2/17    Past Surgical History:  Procedure Laterality Date  . CARDIAC CATHETERIZATION N/A 06/30/2015   Procedure: Left Heart Cath and Coronary Angiography;  Surgeon: Jettie Booze, MD;  Location: [facility];  Service: Cardiovascular;  Laterality: N/A;  . ENDARTERECTOMY FEMORAL Right 06/02/2015   Procedure: ENDARTERECTOMY RIGHT PROFUNDA FEMORIS ARTERY;  Surgeon: Mal Misty, MD;  Location: [facility];  Service: Vascular;  Laterality: Right;  . FEMORAL-TIBIAL BYPASS GRAFT Right 06/02/2015   Procedure: RIGHT PROFUNDA FEMORIS TO POSTERIOR TIBIAL ARTERY BYPASS USING RIGHT NON-REVERSED GREATER SAPHENOUS VEIN,  SUBCUTANEOUS TUNNEL;  Surgeon: Mal Misty, MD;  Location: [facility];  Service: Vascular;  Laterality: Right;  . INTRAOPERATIVE ARTERIOGRAM Right 06/02/2015   Procedure: INTRA OPERATIVE ARTERIOGRAM RIGHT LOWER LEG;  Surgeon: Mal Misty, MD;  Location: [facility];  Service: Vascular;  Laterality: Right;  . No history of Surgery     . PERIPHERAL VASCULAR CATHETERIZATION N/A 06/01/2015   Procedure: Abdominal Aortogram;  Surgeon: Serafina Mitchell, MD;  Location: [facility];  Service: Cardiovascular;  Laterality: N/A;  . PERIPHERAL VASCULAR CATHETERIZATION N/A 07/07/2015   Procedure: Aortic Arch Angiography;  Surgeon: Conrad Logan, MD;  Location: [facility];  Service: Cardiovascular;  Laterality: N/A;  . PERIPHERAL VASCULAR  CATHETERIZATION N/A 07/07/2015   Procedure: Upper Extremity Angiography;  Surgeon: Conrad Racine, MD;  Location: [facility];  Service: Cardiovascular;  Laterality: N/A;  . PERIPHERAL VASCULAR  CATHETERIZATION Left 07/07/2015   Procedure: Peripheral Vascular Intervention;  Surgeon: Conrad [place], MD;  Location: [facility];  Service: Cardiovascular;  Laterality: Left;  left subclavian and axillary  . VEIN HARVEST Right 06/02/2015   Procedure: VEIN HARVEST RIGHT GREATER SAPHENOUS VEIN;  Surgeon: Mal Misty, MD;  Location: [facility];  Service: Vascular;  Laterality: Right;    Allergies: Review of patient's allergies indicates no known allergies.  Medications: Prior to Admission medications   Medication Sig Start Date End Date Taking? Authorizing Provider  aspirin 81 MG EC tablet Take 1 tablet (81 mg total) by mouth daily. Swallow whole. 07/15/15  Yes Camelia Eng Tysinger, PA-C  atorvastatin (LIPITOR) 40 MG tablet Take 1 tablet (40 mg total) by mouth daily. 07/14/15  Yes Liliane Shi, PA-C  clopidogrel (PLAVIX) 75 MG tablet Take 1 tablet (75 mg total) by mouth daily. 07/15/15  Yes Camelia Eng Tysinger, PA-C  collagenase (SANTYL) ointment Apply topically daily. Apply to right foot wound daily. Place moist saline gauze on top and wrap foot daily. 06/07/15  Yes Alvia Grove, PA-C  furosemide (LASIX) 20 MG tablet Take 1 tablet (20 mg total) by mouth daily. 07/15/15  Yes Camelia Eng Tysinger, PA-C  Insulin Degludec (TRESIBA FLEXTOUCH) 100 UNIT/ML SOPN Inject 15 Units into the skin at bedtime. Patient taking differently: Inject 10 Units into the skin at bedtime.  11/17/15  Yes Camelia Eng Tysinger, PA-C  losartan (COZAAR) 25 MG tablet Take 1 tablet (25 mg total) by mouth daily. 07/15/15  Yes Camelia Eng Tysinger, PA-C  metoprolol succinate (TOPROL XL) 25 MG 24 hr tablet Take 1 tablet (25 mg total) by mouth daily. 07/15/15  Yes David S Tysinger, PA-C  NEEDLE, DISP, 30 G (BD DISP NEEDLES) 30G X 1/2" MISC 1 each by Does not apply route at bedtime. 11/17/15  Yes Camelia Eng Tysinger, PA-C  nitroGLYCERIN (NITROSTAT) 0.4 MG SL tablet Place 1 tablet (0.4 mg total) under the tongue every 5 (five) minutes as needed for chest  pain. 06/17/15  Yes Scott T Kathlen Mody, PA-C  pregabalin (LYRICA) 200 MG capsule Take 1 capsule (200 mg total) by mouth 2 (two) times daily. 11/15/15  Yes Camelia Eng Tysinger, PA-C  traMADol (ULTRAM) 50 MG tablet Take 1 tablet (50 mg total) by mouth every 12 (twelve) hours as needed for moderate pain or severe pain. 11/15/15  Yes Camelia Eng Tysinger, PA-C  dapagliflozin propanediol (FARXIGA) 10 MG TABS tablet Take 10 mg by mouth daily. Patient not taking: Reported on 12/01/2015 11/17/15   Camelia Eng Tysinger, PA-C  metFORMIN (GLUCOPHAGE) 1000 MG tablet Take 1 tablet (1,000 mg total) by mouth 2 (two) times daily with a meal. Patient not taking: Reported on 12/01/2015 07/15/15   Carlena Hurl, PA-C     Family History  Problem Relation Age of Onset  . Heart attack Neg Hx     Social History   Social History  . Marital status: Single    Spouse name: N/A  . Number of children: N/A  . Years of education: N/A   Social History Main Topics  . Smoking status: Former Smoker    Packs/day: 1.00    Years: 35.00  . Smokeless tobacco: Never Used  . Alcohol use No  . Drug use: No  . Sexual activity:  Not Asked   Other Topics Concern  . None   Social History Narrative  . None      Review of Systems see above; currently patient denies fever, headache, chest pain, dyspnea, cough, abdominal/back pain, nausea, vomiting or abnormal bleeding. He does have fatigue and scleral icterus.  Vital Signs: BP (!) 122/52   Pulse (!) 54   Temp 97.9 F (36.6 C) (Oral)   Resp 10   Ht 5' 11" (1.803 m)   Wt 169 lb (76.7 kg)   SpO2 100%   BMI 23.57 kg/m   Physical Exam patient awake, alert. Chest clear to auscultation bilaterally. Heart with slightly bradycardic but regular rate. Abdomen soft, positive bowel sounds, nontender. Lower extremities with no edema. Bandage noted on left shin.  Mallampati Score:  MD Evaluation Airway: WNL Heart: WNL Abdomen: WNL Chest/ Lungs: WNL ASA  Classification: 3 Mallampati/Airway  Score: Two  Imaging: Ct Chest W Contrast  Result Date: 11/30/2015 CLINICAL DATA:  Initial evaluation for weight loss, elevated liver function tests, jaundice. EXAM: CT CHEST, ABDOMEN, AND PELVIS WITH CONTRAST TECHNIQUE: Multidetector CT imaging of the chest, abdomen and pelvis was performed following the standard protocol during bolus administration of intravenous contrast. CONTRAST:  129m ISOVUE-300 IOPAMIDOL (ISOVUE-300) INJECTION 61% COMPARISON:  None available. FINDINGS: CT CHEST FINDINGS Few scattered subcentimeter hypodense nodules partially visualize within the thyroid gland, of doubtful clinical significance. Visualized thyroid otherwise unremarkable. No pathologically enlarged mediastinal, hilar, or axillary lymph nodes identified. Intrathoracic aorta of normal caliber without acute abnormality. Scattered calcified plaque within the aortic arch in at the origin of the great vessels. Vascular stent in place at the proximal left subclavian artery. Flow within the stent appears to be patent. No high-grade stenosis at the origin of the great vessels. Heart size within normal limits. Prominent coronary artery calcifications noted, most evident within the LAD. No pericardial effusion. Limited evaluation the pulmonary arteries grossly unremarkable. The lungs are clear without focal infiltrate, pulmonary edema, or pleural effusion. No pneumothorax. Single 4 mm nodular density noted within the superior segment of the right lower lobe (series 4, image 63), indeterminate. Additional 2 mm nodular density along the right major fissure (series 4, image 53), most consistent with a small intra pulmonic lymph node. No other worrisome pulmonary nodule or mass identified. No acute osseous abnormality. No worrisome lytic or blastic osseous lesions. CT ABDOMEN PELVIS FINDINGS Few scattered cystic lucencies noted within the right hepatic lobe near the gallbladder fossa, largest of which measures 1.5 x 1.7 cm (series 2,  image 55). These are indeterminate, and may in reflect dilated biliary ducts. Liver otherwise unremarkable with no other focal intrahepatic lesions identified. Gallbladder somewhat hydropic in appearance without acute inflammatory changes. Common bile duct mildly dilated at 8 mm. Mild intrahepatic biliary dilatation at the hilum of the liver. No cholelithiasis or choledocholithiasis identified. Spleen and adrenal glands within normal limits. There is a somewhat ill-defined hypodense mass centered at the pancreatic head which measures approximately 3.1 x 3.9 x 2.9 cm, highly concerning for primary pancreatic neoplasm. Pancreas at this level is somewhat ill-defined which shaggy margins. There is dilatation of the pancreatic duct distally up to 4 mm. Remainder the pancreatic body and tail are somewhat atrophic in appearance. Mass closely approximates the gastric antrum/pyloric region anteriorly as well as the duodenum laterally. Fat about the celiac axis and SMA is well preserved without frank vascular encasement. No significant peripancreatic adenopathy. Kidneys are equal in size with symmetric enhancement. No nephrolithiasis, hydronephrosis, or focal  enhancing renal mass. Stomach within normal limits. No evidence for bowel obstruction. No abnormal wall thickening, mucosal enhancement, or inflammatory fat stranding seen about the bowels. Appendix well visualized in the right lower quadrant and is normal caliber and appearance without associated inflammatory changes suggest acute appendicitis. Moderate amount of retained stool within the colon. Partially distended bladder within normal limits without acute abnormality. Prostate within normal limits. No free intraperitoneal air. No free fluid. No pathologically enlarged intra-abdominal or pelvic lymph nodes. Postsurgical changes noted within the right groin. Advanced aorto bi-iliac atherosclerotic disease. No aneurysm. No acute osseous abnormality. No worrisome lytic or  blastic osseous lesions. IMPRESSION: 1. Poorly defined hypodense mass at the pancreatic head, measuring approximately 3.1 x 3.9 x 2.9 cm, highly concerning for primary pancreatic neoplasm. Associated mild pancreatic and biliary ductal dilatation as above. 2. Hypodense lesions at the central aspect of the liver, somewhat indeterminate, and may reflect focally dilated biliary ducts. Possible metastases not entirely excluded. 3. Single small 4 mm nodular density within the superior segment of the right lower lobe, indeterminate. Attention at follow-up recommended. 4. Prominent coronary artery and aorto bi-iliac atherosclerotic disease. Electronically Signed   By: Jeannine Boga M.D.   On: 11/30/2015 17:02   Ct Abdomen Pelvis W Contrast  Result Date: 11/30/2015 CLINICAL DATA:  Initial evaluation for weight loss, elevated liver function tests, jaundice. EXAM: CT CHEST, ABDOMEN, AND PELVIS WITH CONTRAST TECHNIQUE: Multidetector CT imaging of the chest, abdomen and pelvis was performed following the standard protocol during bolus administration of intravenous contrast. CONTRAST:  14m ISOVUE-300 IOPAMIDOL (ISOVUE-300) INJECTION 61% COMPARISON:  None available. FINDINGS: CT CHEST FINDINGS Few scattered subcentimeter hypodense nodules partially visualize within the thyroid gland, of doubtful clinical significance. Visualized thyroid otherwise unremarkable. No pathologically enlarged mediastinal, hilar, or axillary lymph nodes identified. Intrathoracic aorta of normal caliber without acute abnormality. Scattered calcified plaque within the aortic arch in at the origin of the great vessels. Vascular stent in place at the proximal left subclavian artery. Flow within the stent appears to be patent. No high-grade stenosis at the origin of the great vessels. Heart size within normal limits. Prominent coronary artery calcifications noted, most evident within the LAD. No pericardial effusion. Limited evaluation the  pulmonary arteries grossly unremarkable. The lungs are clear without focal infiltrate, pulmonary edema, or pleural effusion. No pneumothorax. Single 4 mm nodular density noted within the superior segment of the right lower lobe (series 4, image 63), indeterminate. Additional 2 mm nodular density along the right major fissure (series 4, image 53), most consistent with a small intra pulmonic lymph node. No other worrisome pulmonary nodule or mass identified. No acute osseous abnormality. No worrisome lytic or blastic osseous lesions. CT ABDOMEN PELVIS FINDINGS Few scattered cystic lucencies noted within the right hepatic lobe near the gallbladder fossa, largest of which measures 1.5 x 1.7 cm (series 2, image 55). These are indeterminate, and may in reflect dilated biliary ducts. Liver otherwise unremarkable with no other focal intrahepatic lesions identified. Gallbladder somewhat hydropic in appearance without acute inflammatory changes. Common bile duct mildly dilated at 8 mm. Mild intrahepatic biliary dilatation at the hilum of the liver. No cholelithiasis or choledocholithiasis identified. Spleen and adrenal glands within normal limits. There is a somewhat ill-defined hypodense mass centered at the pancreatic head which measures approximately 3.1 x 3.9 x 2.9 cm, highly concerning for primary pancreatic neoplasm. Pancreas at this level is somewhat ill-defined which shaggy margins. There is dilatation of the pancreatic duct distally up to 4 mm.  Remainder the pancreatic body and tail are somewhat atrophic in appearance. Mass closely approximates the gastric antrum/pyloric region anteriorly as well as the duodenum laterally. Fat about the celiac axis and SMA is well preserved without frank vascular encasement. No significant peripancreatic adenopathy. Kidneys are equal in size with symmetric enhancement. No nephrolithiasis, hydronephrosis, or focal enhancing renal mass. Stomach within normal limits. No evidence for  bowel obstruction. No abnormal wall thickening, mucosal enhancement, or inflammatory fat stranding seen about the bowels. Appendix well visualized in the right lower quadrant and is normal caliber and appearance without associated inflammatory changes suggest acute appendicitis. Moderate amount of retained stool within the colon. Partially distended bladder within normal limits without acute abnormality. Prostate within normal limits. No free intraperitoneal air. No free fluid. No pathologically enlarged intra-abdominal or pelvic lymph nodes. Postsurgical changes noted within the right groin. Advanced aorto bi-iliac atherosclerotic disease. No aneurysm. No acute osseous abnormality. No worrisome lytic or blastic osseous lesions. IMPRESSION: 1. Poorly defined hypodense mass at the pancreatic head, measuring approximately 3.1 x 3.9 x 2.9 cm, highly concerning for primary pancreatic neoplasm. Associated mild pancreatic and biliary ductal dilatation as above. 2. Hypodense lesions at the central aspect of the liver, somewhat indeterminate, and may reflect focally dilated biliary ducts. Possible metastases not entirely excluded. 3. Single small 4 mm nodular density within the superior segment of the right lower lobe, indeterminate. Attention at follow-up recommended. 4. Prominent coronary artery and aorto bi-iliac atherosclerotic disease. Electronically Signed   By: Jeannine Boga M.D.   On: 11/30/2015 17:02   Mr Foot Left Wo Contrast  Result Date: 12/02/2015 CLINICAL DATA:  Pt's wounds on his left leg and left small toe were rewrapped. The area has yellowish green drainage and a foul smell. Pt states he has not changed his bandage since Tuesday when the wound care center changed her bandage . History of diabetes. EXAM: MRI OF THE LEFT FOOT WITHOUT CONTRAST TECHNIQUE: Multiplanar, multisequence MR imaging was performed. No intravenous contrast was administered. COMPARISON:  Left foot x-rays 11/22/2015 FINDINGS:  Patient motion degrades image quality limiting evaluation. Bones/Joint/Cartilage Soft tissue ulceration along the dorsal lateral aspect of the fifth phalanx extending down to the cortex. Mild marrow edema within the distal aspect of the fifth proximal phalanx and the base of the fifth middle phalanx without a definitive cortical destruction or periosteal reaction. No other marrow signal abnormality. No fracture or dislocation. Normal alignment. No joint effusion. Ligaments Collateral ligaments are intact. Muscles and Tendons Visualized flexor, peroneal and extensor compartment tendons are intact. Diffuse T2 hyperintense signal throughout the plantar musculature likely neurogenic. Soft tissue No fluid collection or hematoma. No soft tissue mass. Mild soft tissue edema along the dorsal aspect of the midfoot and forefoot. IMPRESSION: 1. Soft tissue ulceration along the dorsal lateral aspect of the fifth phalanx extending down to the cortex. Mild marrow edema within the distal aspect of the fifth proximal phalanx and the base of the fifth middle phalanx without a definitive cortical destruction or periosteal reaction differential considerations include osteomyelitis versus reactive marrow edema. Electronically Signed   By: Kathreen Devoid   On: 12/02/2015 08:02   Dg Foot Complete Left  Result Date: 11/22/2015 CLINICAL DATA:  Osteomyelitis. EXAM: LEFT FOOT - COMPLETE 3+ VIEW COMPARISON:  08/16/2015. FINDINGS: Erosive changes of the distal phalanx of the left fifth digit cannot be excluded. Osteomyelitis cannot be excluded. Diffuse degenerative change. No radiopaque foreign body . No other focal abnormality IMPRESSION: 1. Erosive changes noted of the  distal phalanx of the left fifth digit cannot be excluded. Osteomyelitis cannot be excluded. 2. Diffuse degenerative change.  No other acute abnormality. Electronically Signed   By: Marcello Moores  Register   On: 11/22/2015 13:45   Dg C-arm 61-120 Min-no Report  Result Date:  12/02/2015 CLINICAL DATA: ercp C-ARM 61-120 MINUTES Fluoroscopy was utilized by the requesting physician.  No radiographic interpretation.    Labs:  CBC:  Recent Labs  11/15/15 1103 12/01/15 1621 12/02/15 0409 12/03/15 0343  WBC 6.1 7.3 7.6 6.8  HGB 12.7* 11.8* 11.4* 10.8*  HCT 40.0 35.5* 34.8* 33.3*  PLT 280 365 332 324    COAGS:  Recent Labs  06/01/15 2015 06/02/15 0300 06/24/15 1024 06/24/15 1858 12/02/15 0409  INR 1.02 1.11 1.04 1.07 1.18  APTT 32  --   --  29  --     BMP:  Recent Labs  06/24/15 1858  11/15/15 1103 12/01/15 1621 12/02/15 0409 12/03/15 0343  NA 140  < > 133* 131* 136 135  K 4.9  < > 4.0 3.7 4.4 4.0  CL 104  < > 98 95* 101 103  CO2 26  --  19* _0 GLUCOSE 195*  < > 487* 469* 202* 334*  BUN 17  < > 6* _1 CALCIUM 9.7  --  9.5 9.3 9.5 8.8*  CREATININE 0.83  < > 0.97 0.60* 0.46* 0.64  GFRNONAA >60  --   --  >60 >60 >60  GFRAA >60  --   --  >60 >60 >60  < > = values in this interval not displayed.  LIVER FUNCTION TESTS:  Recent Labs  11/29/15 1230 12/01/15 1621 12/02/15 0409 12/03/15 0343  BILITOT 13.9* 14.3* 13.3* 12.2*  AST 116* 123* 107* 86*  ALT 194* 164* 154* 119*  ALKPHOS 896* 740* 704* 600*  PROT 7.4 7.7 7.3 6.1*  ALBUMIN 3.9 3.3* 3.1* 2.6*    TUMOR MARKERS:  Recent Labs  11/29/15 1230 12/02/15 1759  AFPTM 1.6  --   CA199  --  10    Assessment and Plan: Patient with history of pancreatic mass, obstructive jaundice, elevated LFTs, indeterminate lung/liver lesions; unable to cannulate CBD via ERCP 12/02/15. Request now received from GI service for PTC with biliary drainage. Imaging studies have been reviewed by Dr. Annamaria Boots and case reviewed with Dr. Benson Norway. Labs reviewed. Details/risks of procedure, including but not limited to, internal bleeding, infection, injury to adjacent structures, discussed with patient with his understanding and consent. Procedure scheduled for today.   Thank you for this interesting  consult.  I greatly enjoyed meeting Neema Fluegge and look forward to participating in their care.  A copy of this report was sent to the requesting provider on this date.  Electronically Signed: D. Rowe Robert 12/03/2015, 1:50 PM   I spent a total of 40 minutes    in face to face in clinical consultation, greater than 50% of which was counseling/coordinating care for percutaneous transhepatic cholangiogram with biliary drainage

## 2015-12-03 NOTE — Progress Notes (Addendum)
Nursing Note: No void this shift.A; Pt scanned for 617 cc.Pged on-call as pt says he cannot void.wbb

## 2015-12-03 NOTE — Progress Notes (Signed)
PROGRESS NOTE    Donald Steele  OEU:[id number] DOB: 07-26-1960 DOA: 12/01/2015 PCP: Crisoforo Oxford, PA-C    Brief Narrative: HPI: Donald Steele is a 55 y.o. male with medical history significant of ischemic cardiomyopathy last EF 35 to 40 % 2-17,s /p R Fem-Tib bypass. , DM, PVD, L subclavian artery stenting.07/07/15 , he relates weight loss since February, more than 60 pounds, diarrhea and decrease appetite. Report abdominal pain, sometimes after meals.  He relates that he has not been taking his plavix for a week now due to nausea. He has not ben taking most of his medications.   presents for evaluation of pancreatic mass, obstructive jaundice. Sodium at 131, glucose 469, Alk phosphatase 740, albumin 3.3, lipase 13, AST 123, ALT 164, bili 14. CT abdomen pelvis done 8-16; 1. Poorly defined hypodense mass at the pancreatic head, measuring approximately 3.1 x 3.9 x 2.9 cm, highly concerning for primary pancreatic neoplasm. Associated mild pancreatic and biliary ductal dilatation as above. Hypodense lesions at the central aspect of the liver, somewhat indeterminate, and may reflect focally dilated biliary ducts. Possible metastases not entirely excluded. Single small 4 mm nodular density within the superior segment of the right lower lobe, indeterminate. Attention at follow-up recommended. Prominent coronary artery and aorto bi-iliac atherosclerotic disease.   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 IR consulted for stent  placement  Plan for EUS biopsy in a week.  Continue to hold plavix aspirin for stent placement.   2-CAD, Cardiomyopathy;  Hold ACE for procedure.  continue with metoprolol.  Resume plavix and aspirin pot procedure when ok by Dr Benson Norway  Lasix.   3-Pancreatic insufficiency; start pancreatic enzymes post procedure.   4-Diabetes; uncontrolled, hyperglycemia; continue with Insulin. SSI.   5-oral candidiasis; nystatin orederd.   6-left fifth toe ulcer; MRI ; 5 th toe with osteomyelitis vs bone marrow edema. Discussed with ID continue with doxy, probably bone marrow edema. Follow up outpatient.  Patient was prescribe Doxy, will continue.  Multiple ulcers LE; wound care consulted.   Urine rentention; if persist might need foley  Start flomax.   DVT prophylaxis: scd Code Status: full code.  Family Communication: care discussed with patient.  Disposition Plan: home in 24 hours.   Consultants:   Dr Benson Norway    Procedures:   ERCP   Antimicrobials:  Doxycycline    Subjective: Denies worsening abdominal pain.  He just came from endoscopy, procedure fail. Going to IR for stent placement.   Objective: Vitals:   12/02/15 1750 12/02/15 2050 12/03/15 0534 12/03/15 0956  BP: (!) 147/73 124/61 129/69 (!) 122/52  Pulse: 74 64 65 (!) 54  Resp: 18 16 16 10   Temp: 98.2 F (36.8 C) 99 F (37.2 C) 97.7 F (36.5 C) 97.9 F (36.6 C)  TempSrc: Oral Oral Oral Oral  SpO2: 100% 100% 100% 100%  Weight:    76.7 kg (169 lb)  Height:    5' 11"  (1.803 m)    Intake/Output Summary (Last 24 hours) at 12/03/15 1344 Last data filed at 12/03/15 1540  Gross  per 24 hour  Intake             2031 ml  Output              895 ml  Net             1136 ml   Filed Weights   12/03/15 0956  Weight: 76.7 kg (169 lb)    Examination:  General exam: Appears calm and comfortable , icteric.  Respiratory system: Clear to auscultation. Respiratory effort normal. Cardiovascular system: S1 & S2 heard,  RRR. No JVD, murmurs, rubs, gallops or clicks. No pedal edema. Gastrointestinal system: Abdomen is nondistended, soft and nontender. No organomegaly or masses felt. Normal bowel sounds heard. Central nervous system: Alert and oriented. No focal neurological deficits. Extremities: Symmetric 5 x 5 power. Skin: multiples wound. Left fifth toe with ulceration, mild drainage.     Data Reviewed: I have personally reviewed following labs and imaging studies  CBC:  Recent Labs Lab 12/01/15 1621 12/02/15 0409 12/03/15 0343  WBC 7.3 7.6 6.8  HGB 11.8* 11.4* 10.8*  HCT 35.5* 34.8* 33.3*  MCV 84.5 84.9 85.2  PLT 365 332 177   Basic Metabolic Panel:  Recent Labs Lab 12/01/15 1621 12/02/15 0409 12/03/15 0343  NA 131* 136 135  K 3.7 4.4 4.0  CL 95* 101 103  CO2 25 27 25   GLUCOSE 469* 202* 334*  BUN 10 7 9   CREATININE 0.60* 0.46* 0.64  CALCIUM 9.3 9.5 8.8*   GFR: Estimated Creatinine Clearance: 111.1 mL/min (by C-G formula based on SCr of 0.8 mg/dL). Liver Function Tests:  Recent Labs Lab 11/29/15 1230 12/01/15 1621 12/02/15 0409 12/03/15 0343  AST 116* 123* 107* 86*  ALT 194* 164* 154* 119*  ALKPHOS 896* 740* 704* 600*  BILITOT 13.9* 14.3* 13.3* 12.2*  PROT 7.4 7.7 7.3 6.1*  ALBUMIN 3.9 3.3* 3.1* 2.6*    Recent Labs Lab 12/01/15 1621  LIPASE 13   No results for input(s): AMMONIA in the last 168 hours. Coagulation Profile:  Recent Labs Lab 12/02/15 0409  INR 1.18   Cardiac Enzymes: No results for input(s): CKTOTAL, CKMB, CKMBINDEX, TROPONINI in the last 168 hours. BNP (last 3 results) No results for input(s): PROBNP in the last 8760 hours. HbA1C: No results for input(s): HGBA1C in the last 72 hours. CBG:  Recent Labs Lab 12/02/15 1128 12/02/15 2203 12/03/15 0819 12/03/15 1026 12/03/15 1151  GLUCAP 174* 249* 246* 231* 213*   Lipid Profile: No results for input(s): CHOL, HDL, LDLCALC, TRIG, CHOLHDL, LDLDIRECT in the last 72 hours. Thyroid  Function Tests: No results for input(s): TSH, T4TOTAL, FREET4, T3FREE, THYROIDAB in the last 72 hours. Anemia Panel: No results for input(s): VITAMINB12, FOLATE, FERRITIN, TIBC, IRON, RETICCTPCT in the last 72 hours. Sepsis Labs: No results for input(s): PROCALCITON, LATICACIDVEN in the last 168 hours.  No results found for this or any previous visit (from the past 240 hour(s)).       Radiology Studies: Mr Foot Left Wo Contrast  Result Date: 12/02/2015 CLINICAL DATA:  Pt's wounds on his left leg and left small toe were rewrapped. The area has yellowish green drainage and a foul smell. Pt states he has not changed his bandage since Tuesday when the wound care center changed her bandage . History of diabetes. EXAM: MRI OF THE LEFT FOOT WITHOUT CONTRAST TECHNIQUE: Multiplanar, multisequence MR imaging was performed. No intravenous contrast was administered. COMPARISON:  Left foot x-rays 11/22/2015 FINDINGS: Patient motion degrades image quality  limiting evaluation. Bones/Joint/Cartilage Soft tissue ulceration along the dorsal lateral aspect of the fifth phalanx extending down to the cortex. Mild marrow edema within the distal aspect of the fifth proximal phalanx and the base of the fifth middle phalanx without a definitive cortical destruction or periosteal reaction. No other marrow signal abnormality. No fracture or dislocation. Normal alignment. No joint effusion. Ligaments Collateral ligaments are intact. Muscles and Tendons Visualized flexor, peroneal and extensor compartment tendons are intact. Diffuse T2 hyperintense signal throughout the plantar musculature likely neurogenic. Soft tissue No fluid collection or hematoma. No soft tissue mass. Mild soft tissue edema along the dorsal aspect of the midfoot and forefoot. IMPRESSION: 1. Soft tissue ulceration along the dorsal lateral aspect of the fifth phalanx extending down to the cortex. Mild marrow edema within the distal aspect of the fifth  proximal phalanx and the base of the fifth middle phalanx without a definitive cortical destruction or periosteal reaction differential considerations include osteomyelitis versus reactive marrow edema. Electronically Signed   By: Kathreen Devoid   On: 12/02/2015 08:02   Dg C-arm 61-120 Min-no Report  Result Date: 12/02/2015 CLINICAL DATA: ercp C-ARM 61-120 MINUTES Fluoroscopy was utilized by the requesting physician.  No radiographic interpretation.        Scheduled Meds: . collagenase   Topical Daily  . doxycycline  100 mg Oral Q12H  . folic acid  1 mg Oral Daily  . furosemide  20 mg Oral Daily  . insulin aspart  0-9 Units Subcutaneous TID WC  . insulin glargine  10 Units Subcutaneous QHS  . lipase/protease/amylase  36,000 Units Oral TID AC  . metoprolol succinate  25 mg Oral Daily  . multivitamin with minerals  1 tablet Oral Daily  . nystatin  5 mL Oral QID  . pregabalin  200 mg Oral BID   Continuous Infusions: . sodium chloride 50 mL/hr at 12/02/15 1840     LOS: 2 days    Time spent: 35 minutes.     Elmarie Shiley, MD [facility] Pager [phone]  If 7PM-7AM, please contact night-coverage [email or web address] Password [email or web address] 12/03/2015, 1:44 PM

## 2015-12-03 NOTE — Anesthesia Preprocedure Evaluation (Deleted)
Anesthesia Evaluation  Patient identified by MRN, date of birth, ID band  Reviewed: Allergy & Precautions, H&P , NPO status , Patient's Chart, lab work & pertinent test results, reviewed documented beta blocker date and time   Airway Mallampati: II  TM Distance: >3 FB Neck ROM: Full    Dental no notable dental hx. (+) Teeth Intact, Dental Advisory Given   Pulmonary neg pulmonary ROS, former smoker,    Pulmonary exam normal breath sounds clear to auscultation       Cardiovascular + CAD, + Peripheral Vascular Disease and +CHF   Rhythm:Regular Rate:Normal  - Left ventricle: The cavity size was normal. Systolic function was   moderately reduced. The estimated ejection fraction was in the   range of 35% to 40%. Moderate diffuse hypokinesis. There is   akinesis of the basalinferoseptal myocardium. There is akinesis   of the basal-mid myocardium. There is akinesis of the   entireinferolateral myocardium. There was an increased relative   contribution of atrial contraction to ventricular filling.   Doppler parameters are consistent with abnormal left ventricular   relaxation (grade 1 diastolic dysfunction). - Aortic valve: Trileaflet; mildly thickened, mildly calcified   leaflets. - Mitral valve: There was mild regurgitation. - Tricuspid valve: There was trivial regurgitation.    Neuro/Psych negative neurological ROS  negative psych ROS   GI/Hepatic negative GI ROS, Neg liver ROS,   Endo/Other  diabetes, Insulin Dependent  Renal/GU negative Renal ROS  negative genitourinary   Musculoskeletal   Abdominal   Peds  Hematology negative hematology ROS (+)   Anesthesia Other Findings   Reproductive/Obstetrics negative OB ROS                             Anesthesia Physical  Anesthesia Plan  ASA: III  Anesthesia Plan: General   Post-op Pain Management:    Induction: Intravenous  Airway  Management Planned: Oral ETT  Additional Equipment:   Intra-op Plan:   Post-operative Plan: Extubation in OR  Informed Consent: I have reviewed the patients History and Physical, chart, labs and discussed the procedure including the risks, benefits and alternatives for the proposed anesthesia with the patient or authorized representative who has indicated his/her understanding and acceptance.   Dental advisory given  Plan Discussed with: CRNA, Anesthesiologist and Surgeon  Anesthesia Plan Comments:         Anesthesia Quick Evaluation

## 2015-12-04 LAB — COMPREHENSIVE METABOLIC PANEL
ALK PHOS: 572 U/L — AB (ref 38–126)
ALT: 132 U/L — AB (ref 17–63)
AST: 86 U/L — AB (ref 15–41)
Albumin: 2.8 g/dL — ABNORMAL LOW (ref 3.5–5.0)
Anion gap: 9 (ref 5–15)
BILIRUBIN TOTAL: 8.3 mg/dL — AB (ref 0.3–1.2)
BUN: 9 mg/dL (ref 6–20)
CHLORIDE: 105 mmol/L (ref 101–111)
CO2: 24 mmol/L (ref 22–32)
CREATININE: 0.56 mg/dL — AB (ref 0.61–1.24)
Calcium: 9.1 mg/dL (ref 8.9–10.3)
GFR calc Af Amer: >60 mL/min (ref >60)
Glucose, Bld: 160 mg/dL — ABNORMAL HIGH (ref 65–99)
Potassium: 3.9 mmol/L (ref 3.5–5.1)
Sodium: 138 mmol/L (ref 135–145)
TOTAL PROTEIN: 6.2 g/dL — AB (ref 6.5–8.1)

## 2015-12-04 LAB — GLUCOSE, CAPILLARY
GLUCOSE-CAPILLARY: 151 mg/dL — AB (ref 65–99)
GLUCOSE-CAPILLARY: 131 mg/dL — AB (ref 65–99)
Glucose-Capillary: 152 mg/dL — ABNORMAL HIGH (ref 65–99)
Glucose-Capillary: 160 mg/dL — ABNORMAL HIGH (ref 65–99)

## 2015-12-04 LAB — CBC
HEMATOCRIT: 34.7 % — AB (ref 39.0–52.0)
HEMOGLOBIN: 11.4 g/dL — AB (ref 13.0–17.0)
MCH: 27.8 pg (ref 26.0–34.0)
MCHC: 32.9 g/dL (ref 30.0–36.0)
MCV: 84.6 fL (ref 78.0–100.0)
Platelets: 332 10*3/uL (ref 150–400)
RBC: 4.1 MIL/uL — AB (ref 4.22–5.81)
RDW: 14.9 % (ref 11.5–15.5)
WBC: 9.4 10*3/uL (ref 4.0–10.5)

## 2015-12-04 MED ORDER — FLUCONAZOLE IN SODIUM CHLORIDE 100-0.9 MG/50ML-% IV SOLN
100.0000 mg | INTRAVENOUS | Status: DC
  Administered 2015-12-04 – 2015-12-08 (×4): 100 mg via INTRAVENOUS
  Filled 2015-12-04 (×7): qty 50

## 2015-12-04 NOTE — Progress Notes (Signed)
PROGRESS NOTE    Donald Steele  ZOX:[id number] DOB: July 30, 1960 DOA: 12/01/2015 PCP: Crisoforo Oxford, PA-C    Brief Narrative: HPI: Donald Steele is a 55 y.o. male with medical history significant of ischemic cardiomyopathy last EF 35 to 40 % 2-17,s /p R Fem-Tib bypass. , DM, PVD, L subclavian artery stenting.07/07/15 , he relates weight loss since February, more than 60 pounds, diarrhea and decrease appetite. Report abdominal pain, sometimes after meals.  He relates that he has not been taking his plavix for a week now due to nausea. He has not ben taking most of his medications.   presents for evaluation of pancreatic mass, obstructive jaundice. Sodium at 131, glucose 469, Alk phosphatase 740, albumin 3.3, lipase 13, AST 123, ALT 164, bili 14. CT abdomen pelvis done 8-16; 1. Poorly defined hypodense mass at the pancreatic head, measuring approximately 3.1 x 3.9 x 2.9 cm, highly concerning for primary pancreatic neoplasm. Associated mild pancreatic and biliary ductal dilatation as above. Hypodense lesions at the central aspect of the liver, somewhat indeterminate, and may reflect focally dilated biliary ducts. Possible metastases not entirely excluded. Single small 4 mm nodular density within the superior segment of the right lower lobe, indeterminate. Attention at follow-up recommended. Prominent coronary artery and aorto bi-iliac atherosclerotic disease.   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 IR consulted for stent  placement. Patient S/P intrinsic    Plan for EUS biopsy next  week.  Continue to hold plavix aspirin for stent placement. Continue to hold until biopsy next week.  Report nausea, vomiting last night. Will see if he tolerates diet today.   Left subclavian artery; 06-2015; Discussed with Dr Bridgett Larsson, ok to hold plavix for biopsy next week. And patient has not being taking it.   2-CAD, Cardiomyopathy;  Hold ACE for procedure.  continue with metoprolol.  Resume plavix and aspirin pot procedure when ok by Dr Benson Norway  Lasix.   3-Pancreatic insufficiency; started  pancreatic enzymes.   4-Diabetes; uncontrolled, hyperglycemia; continue with Insulin. SSI.   5-oral candidiasis; nystatin orederd.   6-left fifth toe ulcer; MRI ; 5 th toe with osteomyelitis vs bone marrow edema. Discussed with ID continue with doxy, probably bone marrow edema. Follow up outpatient.  Patient was prescribe Doxy, will continue.  Multiple ulcers LE; wound care consulted.   Urine rentention; if persist might need foley  Started  flomax.   DVT prophylaxis: scd Code Status: full code.  Family Communication: care discussed with patient.  Disposition Plan: home in 24 hours. If tolerates diet.   Consultants:   Dr Benson Norway    Procedures:   ERCP   Antimicrobials:  Doxycycline    Subjective: Vomited last night.  Denies worsening abdominal pain.   Objective: Vitals:   12/03/15 1805 12/03/15 1918 12/03/15 2112 12/04/15 0503  BP: (!) 156/136 (!) 166/74 (!) 168/66 (!) 148/69  Pulse: 78  74 78  Resp: 14   15  Temp: 97.7 F (36.5 C)  97.8 F (36.6 C) 97.1 F (36.2 C)  TempSrc: Oral  Oral Oral  SpO2:  100%  100% 100%  Weight:      Height:        Intake/Output Summary (Last 24 hours) at 12/04/15 1233 Last data filed at 12/04/15 1104  Gross per 24 hour  Intake              955 ml  Output              500 ml  Net              455 ml   Filed Weights   12/03/15 0956  Weight: 76.7 kg (169 lb)     Examination:  General exam: Appears calm and comfortable , icteric.  Respiratory system: Clear to auscultation. Respiratory effort normal. Cardiovascular system: S1 & S2 heard, RRR. No JVD, murmurs, rubs, gallops or clicks. No pedal edema. Gastrointestinal system: Abdomen is nondistended, soft and nontender. No organomegaly or masses felt. Normal bowel sounds heard., drain right side in place with clean dressing  Central nervous system: Alert and oriented. No focal neurological deficits. Extremities: Symmetric 5 x 5 power. Skin: multiples wound. Left fifth toe with ulceration, mild drainage.     Data Reviewed: I have personally reviewed following labs and imaging studies  CBC:  Recent Labs Lab 12/01/15 1621 12/02/15 0409 12/03/15 0343 12/04/15 0422  WBC 7.3 7.6 6.8 9.4  HGB 11.8* 11.4* 10.8* 11.4*  HCT 35.5* 34.8* 33.3* 34.7*  MCV 84.5 84.9 85.2 84.6  PLT 365 332 324 470   Basic Metabolic Panel:  Recent Labs Lab 12/01/15 1621 12/02/15 0409 12/03/15 0343 12/04/15 0422  NA 131* 136 135 138  K 3.7 4.4 4.0 3.9  CL 95* 101 103 105  CO2 25 27 25 24   GLUCOSE 469* 202* 334* 160*  BUN 10 7 9 9   CREATININE 0.60* 0.46* 0.64 0.56*  CALCIUM 9.3 9.5 8.8* 9.1   GFR: Estimated Creatinine Clearance: 111.1 mL/min (by C-G formula based on SCr of 0.8 mg/dL). Liver Function Tests:  Recent Labs Lab 11/29/15 1230 12/01/15 1621 12/02/15 0409 12/03/15 0343 12/04/15 0422  AST 116* 123* 107* 86* 86*  ALT 194* 164* 154* 119* 132*  ALKPHOS 896* 740* 704* 600* 572*  BILITOT 13.9* 14.3* 13.3* 12.2* 8.3*  PROT 7.4 7.7 7.3 6.1* 6.2*  ALBUMIN 3.9 3.3* 3.1* 2.6* 2.8*    Recent Labs Lab 12/01/15 1621  LIPASE 13   No results for input(s): AMMONIA in the last 168 hours. Coagulation Profile:  Recent Labs Lab 12/02/15 0409  INR 1.18   Cardiac Enzymes: No results for input(s): CKTOTAL, CKMB, CKMBINDEX, TROPONINI in the last 168 hours. BNP (last 3 results) No results for  input(s): PROBNP in the last 8760 hours. HbA1C: No results for input(s): HGBA1C in the last 72 hours. CBG:  Recent Labs Lab 12/03/15 1026 12/03/15 1151 12/03/15 1751 12/03/15 2110 12/04/15 0832  GLUCAP 231* 213* 137* 173* 152*   Lipid Profile: No results for input(s): CHOL, HDL, LDLCALC, TRIG, CHOLHDL, LDLDIRECT in the last 72 hours. Thyroid Function Tests: No results for input(s): TSH, T4TOTAL, FREET4, T3FREE, THYROIDAB in the last 72 hours. Anemia Panel: No results for input(s): VITAMINB12, FOLATE, FERRITIN, TIBC, IRON, RETICCTPCT in the last 72 hours. Sepsis Labs: No results for input(s): PROCALCITON, LATICACIDVEN in the last 168 hours.  Recent Results (from the past 240 hour(s))  Aerobic/Anaerobic Culture (surgical/deep wound)     Status: None (Preliminary result)   Collection Time: 12/03/15  4:39 PM  Result Value Ref Range Status   Specimen Description BILE  Final   Special Requests Normal  Final   Gram Stain   Final    FEW WBC PRESENT, PREDOMINANTLY MONONUCLEAR NO ORGANISMS SEEN    Culture   Final    FEW YEAST CRITICAL RESULT CALLED TO, READ BACK BY AND VERIFIED WITH: E BETHEL,RN AT 1225 12/04/15 BY L BENFIELD Performed at [facility]    Report Status PENDING  Incomplete         Radiology Studies: Dg C-arm 61-120 Min-no Report  Result Date: 12/02/2015 CLINICAL DATA: ercp C-ARM 61-120 MINUTES Fluoroscopy was utilized by the requesting physician.  No radiographic interpretation.   Ir Int Lianne Cure Biliary Drain With Cholangiogram  Result Date: 12/03/2015 INDICATION: Obstructive jaundice, central pancreatic mass, unsuccessfully ERCP stent insertion. EXAM: IR INT-EXT BILIARY DRAIN W/ CHOLANGIOGRAM MEDICATIONS: 3.375 g Zosyn; The antibiotic was administered within an appropriate time frame prior to the initiation of the procedure. ANESTHESIA/SEDATION: Moderate (conscious) sedation was employed during this procedure. A total of Versed 6 mg and Fentanyl 300 mcg was  administered intravenously. Moderate Sedation Time: 73 minutes. The patient's level of consciousness and vital signs were monitored continuously by radiology nursing throughout the procedure under my direct supervision. FLUOROSCOPY TIME:  Fluoroscopy Time: 30 minutes 36 seconds (614 mGy). COMPLICATIONS: None immediate. PROCEDURE: Informed written consent was obtained from the patient after a thorough discussion of the procedural risks, benefits and alternatives. All questions were addressed. Maximal Sterile Barrier Technique was utilized including caps, mask, sterile gowns, sterile gloves, sterile drape, hand hygiene and skin antiseptic. A timeout was performed prior to the initiation of the procedure. Under sterile conditions and local anesthesia, initially percutaneous ultrasound access performed of the right hepatic lobe in the mid axillary line with a 21 gauge needle. Several passes were made. Eventually a peripheral right hepatic bile duct was accessed for cholangiogram. Cholangiogram performed. This demonstrates minor intrahepatic biliary dilatation. Extrahepatic common bile duct is dilated with obstruction distally. Several additional 21 gauge needle passes were made through the liver with ultrasound and fluoroscopy. Eventually, an inferior right hepatic duct was accessed with a 21 gauge needle more centrally. A 0.018 guidewire was eventually advanced into the common bile duct. Four [place] Accustick dilator set was advanced. Outer dilator was advanced to the common bile duct. Contrast injection confirms position. Amplatz guidewire advanced and crossed into the duodenum. Ten [place] tract dilatation performed. Eventually, a 12 [place] internal external biliary drain was able to be advanced over the Amplatz guidewire with the retention loop formed in the duodenum. Peripheral radiopaque marker within the right hepatic duct. Contrast injection confirms adequate drainage of the right and left hepatic ducts. Catheter  secured with a Prolene retention suture and connected to external gravity drainage. Sterile dressing applied. No immediate complication. Patient tolerated the procedure well. Bile sample sent for Gram stain and culture. IMPRESSION: Difficult but successful ultrasound and fluoroscopically guided right 10 [place] internal external biliary drain insertion. Electronically Signed   By: Jerilynn Mages.  Shick M.D.   On: 12/03/2015 17:07        Scheduled Meds: . collagenase   Topical Daily  . doxycycline  100 mg Oral Q12H  . folic acid  1 mg Oral Daily  . furosemide  20 mg Oral Daily  . insulin aspart  0-9 Units Subcutaneous TID WC  . insulin glargine  10 Units Subcutaneous QHS  . lipase/protease/amylase  36,000 Units Oral TID AC  . metoprolol succinate  25 mg Oral Daily  . multivitamin with minerals  1 tablet Oral Daily  .  nystatin  5 mL Oral QID  . pregabalin  200 mg Oral BID  . tamsulosin  0.4 mg Oral Daily   Continuous Infusions: . sodium chloride 50 mL/hr at 12/04/15 0627     LOS: 3 days    Time spent: 35 minutes.     Elmarie Shiley, MD [facility] Pager [phone]  If 7PM-7AM, please contact night-coverage [email or web address] Password [email or web address] 12/04/2015, 12:33 PM

## 2015-12-04 NOTE — Progress Notes (Signed)
Subjective: "I'm still not able to eat."  Objective: Vital signs in last 24 hours: Temp:  [97.1 F (36.2 C)-97.9 F (36.6 C)] 97.1 F (36.2 C) (08/20 0503) Pulse Rate:  [48-81] 78 (08/20 0503) Resp:  [10-35] 15 (08/20 0503) BP: (93-173)/(52-140) 148/69 (08/20 0503) SpO2:  [94 %-100 %] 100 % (08/20 0503) Weight:  [76.7 kg (169 lb)] 76.7 kg (169 lb) (08/19 0956) Last BM Date: 12/01/15  Intake/Output from previous day: 08/19 0701 - 08/20 0700 In: 1200 [I.V.:1200] Out: 500 [Emesis/NG output:300; Drains:200] Intake/Output this shift: No intake/output data recorded.  General appearance: alert and no distress GI: soft, non-tender; bowel sounds normal; no masses,  no organomegaly and PTC  Lab Results:  Recent Labs  12/02/15 0409 12/03/15 0343 12/04/15 0422  WBC 7.6 6.8 9.4  HGB 11.4* 10.8* 11.4*  HCT 34.8* 33.3* 34.7*  PLT 332 324 332   BMET  Recent Labs  12/02/15 0409 12/03/15 0343 12/04/15 0422  NA 136 135 138  K 4.4 4.0 3.9  CL 101 103 105  CO2 27 25 24   GLUCOSE 202* 334* 160*  BUN 7 9 9   CREATININE 0.46* 0.64 0.56*  CALCIUM 9.5 8.8* 9.1   LFT  Recent Labs  12/04/15 0422  PROT 6.2*  ALBUMIN 2.8*  AST 86*  ALT 132*  ALKPHOS 572*  BILITOT 8.3*   PT/INR  Recent Labs  12/02/15 0409  LABPROT 15.1  INR 1.18   Hepatitis Panel No results for input(s): HEPBSAG, HCVAB, HEPAIGM, HEPBIGM in the last 72 hours. C-Diff No results for input(s): CDIFFTOX in the last 72 hours. Fecal Lactopherrin No results for input(s): FECLLACTOFRN in the last 72 hours.  Studies/Results: Dg C-arm 61-120 Min-no Report  Result Date: 12/02/2015 CLINICAL DATA: ercp C-ARM 61-120 MINUTES Fluoroscopy was utilized by the requesting physician.  No radiographic interpretation.   Ir Int Lianne Cure Biliary Drain With Cholangiogram  Result Date: 12/03/2015 INDICATION: Obstructive jaundice, central pancreatic mass, unsuccessfully ERCP stent insertion. EXAM: IR INT-EXT BILIARY DRAIN W/  CHOLANGIOGRAM MEDICATIONS: 3.375 g Zosyn; The antibiotic was administered within an appropriate time frame prior to the initiation of the procedure. ANESTHESIA/SEDATION: Moderate (conscious) sedation was employed during this procedure. A total of Versed 6 mg and Fentanyl 300 mcg was administered intravenously. Moderate Sedation Time: 73 minutes. The patient's level of consciousness and vital signs were monitored continuously by radiology nursing throughout the procedure under my direct supervision. FLUOROSCOPY TIME:  Fluoroscopy Time: 30 minutes 36 seconds (614 mGy). COMPLICATIONS: None immediate. PROCEDURE: Informed written consent was obtained from the patient after a thorough discussion of the procedural risks, benefits and alternatives. All questions were addressed. Maximal Sterile Barrier Technique was utilized including caps, mask, sterile gowns, sterile gloves, sterile drape, hand hygiene and skin antiseptic. A timeout was performed prior to the initiation of the procedure. Under sterile conditions and local anesthesia, initially percutaneous ultrasound access performed of the right hepatic lobe in the mid axillary line with a 21 gauge needle. Several passes were made. Eventually a peripheral right hepatic bile duct was accessed for cholangiogram. Cholangiogram performed. This demonstrates minor intrahepatic biliary dilatation. Extrahepatic common bile duct is dilated with obstruction distally. Several additional 21 gauge needle passes were made through the liver with ultrasound and fluoroscopy. Eventually, an inferior right hepatic duct was accessed with a 21 gauge needle more centrally. A 0.018 guidewire was eventually advanced into the common bile duct. Four [place] Accustick dilator set was advanced. Outer dilator was advanced to the common bile duct. Contrast injection confirms  position. Amplatz guidewire advanced and crossed into the duodenum. Ten [place] tract dilatation performed. Eventually, a 52  [place] internal external biliary drain was able to be advanced over the Amplatz guidewire with the retention loop formed in the duodenum. Peripheral radiopaque marker within the right hepatic duct. Contrast injection confirms adequate drainage of the right and left hepatic ducts. Catheter secured with a Prolene retention suture and connected to external gravity drainage. Sterile dressing applied. No immediate complication. Patient tolerated the procedure well. Bile sample sent for Gram stain and culture. IMPRESSION: Difficult but successful ultrasound and fluoroscopically guided right 10 French internal external biliary drain insertion. Electronically Signed   By: Jerilynn Mages.  Shick M.D.   On: 12/03/2015 17:07    Medications:  Scheduled: . collagenase   Topical Daily  . doxycycline  100 mg Oral Q12H  . folic acid  1 mg Oral Daily  . furosemide  20 mg Oral Daily  . insulin aspart  0-9 Units Subcutaneous TID WC  . insulin glargine  10 Units Subcutaneous QHS  . lipase/protease/amylase  36,000 Units Oral TID AC  . metoprolol succinate  25 mg Oral Daily  . multivitamin with minerals  1 tablet Oral Daily  . nystatin  5 mL Oral QID  . pregabalin  200 mg Oral BID  . tamsulosin  0.4 mg Oral Daily   Continuous: . sodium chloride 50 mL/hr at 12/04/15 2423    Assessment/Plan: 1) Pancreatic head mass. 2) Obstructive jaundice s/p PTC.   I am appreciative of IR's intervention.  His TB has dropped down to 8.3.  He reports that he has vomiting with PO intake.  There was no overt evidence of esophagitis.  During the ERCP there was evidence of the mass affecting the duodenum.  There was an ulceration and inflammation in the duodenal bulb.  No obstruction, but the lumen was narrowed.  This may be the source or a contributing source to his symptoms.  The cancer in of itself may be the cause of these symptoms also.  Plan: 1) I will arrange an EUS with [facility]NA as an outpatient.  I am planning on performing this procedure  this week. 2) If the other services agree, I think he can be discharged home.   LOS: 3 days   Fatima Fedie D 12/04/2015, 7:27 AM

## 2015-12-04 NOTE — Progress Notes (Signed)
Pt refused to take oral medications tonight (doxycycline, lyrica) because he has been unable to keep any PO intake down. Pts wife was unhappy with the orders for the dressing changes. She watched as I did the dressings and stated that the wound care center has instructed them to do it a certain way and that is how she has always done it. She stated I needed to put the santyl on "thick" (order calls for 1/8" layer) and also would not allow me to put the saline moistened gauze on the left shin wound because "that irritates him". Wife and pt did not want me to change the dressing on the right dorsal foot surface. Attempted to provide wound care education. Pt and wife would benefit from visit from [facility] RN for further education. Hortencia Conradi RN

## 2015-12-05 ENCOUNTER — Inpatient Hospital Stay (HOSPITAL_COMMUNITY): Payer: BLUE CROSS/BLUE SHIELD

## 2015-12-05 ENCOUNTER — Encounter (HOSPITAL_COMMUNITY): Payer: Self-pay | Admitting: Gastroenterology

## 2015-12-05 LAB — CBC
HCT: 37.5 % — ABNORMAL LOW (ref 39.0–52.0)
Hemoglobin: 12.1 g/dL — ABNORMAL LOW (ref 13.0–17.0)
MCH: 27.8 pg (ref 26.0–34.0)
MCHC: 32.3 g/dL (ref 30.0–36.0)
MCV: 86.2 fL (ref 78.0–100.0)
PLATELETS: 368 10*3/uL (ref 150–400)
RBC: 4.35 MIL/uL (ref 4.22–5.81)
RDW: 15 % (ref 11.5–15.5)
WBC: 9.3 10*3/uL (ref 4.0–10.5)

## 2015-12-05 LAB — COMPREHENSIVE METABOLIC PANEL
ALT: 105 U/L — ABNORMAL HIGH (ref 17–63)
AST: 52 U/L — ABNORMAL HIGH (ref 15–41)
Albumin: 3.1 g/dL — ABNORMAL LOW (ref 3.5–5.0)
Alkaline Phosphatase: 542 U/L — ABNORMAL HIGH (ref 38–126)
Anion gap: 9 (ref 5–15)
BUN: 10 mg/dL (ref 6–20)
CHLORIDE: 104 mmol/L (ref 101–111)
CO2: 27 mmol/L (ref 22–32)
CREATININE: 0.54 mg/dL — AB (ref 0.61–1.24)
Calcium: 9.4 mg/dL (ref 8.9–10.3)
Glucose, Bld: 92 mg/dL (ref 65–99)
POTASSIUM: 3.6 mmol/L (ref 3.5–5.1)
Sodium: 140 mmol/L (ref 135–145)
Total Bilirubin: 6.3 mg/dL — ABNORMAL HIGH (ref 0.3–1.2)
Total Protein: 6.7 g/dL (ref 6.5–8.1)

## 2015-12-05 LAB — GLUCOSE, CAPILLARY
GLUCOSE-CAPILLARY: 91 mg/dL (ref 65–99)
GLUCOSE-CAPILLARY: 106 mg/dL — AB (ref 65–99)
GLUCOSE-CAPILLARY: 90 mg/dL (ref 65–99)
Glucose-Capillary: 121 mg/dL — ABNORMAL HIGH (ref 65–99)

## 2015-12-05 MED ORDER — ONDANSETRON HCL 4 MG PO TABS
4.0000 mg | ORAL_TABLET | Freq: Three times a day (TID) | ORAL | Status: DC
  Administered 2015-12-06 – 2015-12-09 (×8): 4 mg via ORAL
  Filled 2015-12-05 (×9): qty 1

## 2015-12-05 MED ORDER — INSULIN GLARGINE 100 UNIT/ML ~~LOC~~ SOLN: 5.0000 [IU] | Freq: Every day | SUBCUTANEOUS | Status: DC

## 2015-12-05 NOTE — Progress Notes (Signed)
Patient ID: Donald Steele, male   DOB: 1960/10/22, 55 y.o.   MRN: [id number]    Referring Physician(s): Dr. Carol Ada  Supervising Physician: Marybelle Killings  Patient Status: inpt  Chief Complaint: Pancreatic mass with obstructive CBD  Subjective: Patient with no abdominal pain today.  Eating slightly better with some improvement of his nausea and vomiting.  Having a fair amount of output from his PTC drain.  Allergies: Review of patient's allergies indicates no known allergies.  Medications: Prior to Admission medications   Medication Sig Start Date End Date Taking? Authorizing Provider  aspirin 81 MG EC tablet Take 1 tablet (81 mg total) by mouth daily. Swallow whole. 07/15/15  Yes Camelia Eng Tysinger, PA-C  atorvastatin (LIPITOR) 40 MG tablet Take 1 tablet (40 mg total) by mouth daily. 07/14/15  Yes Liliane Shi, PA-C  clopidogrel (PLAVIX) 75 MG tablet Take 1 tablet (75 mg total) by mouth daily. 07/15/15  Yes Camelia Eng Tysinger, PA-C  collagenase (SANTYL) ointment Apply topically daily. Apply to right foot wound daily. Place moist saline gauze on top and wrap foot daily. 06/07/15  Yes Alvia Grove, PA-C  furosemide (LASIX) 20 MG tablet Take 1 tablet (20 mg total) by mouth daily. 07/15/15  Yes Camelia Eng Tysinger, PA-C  Insulin Degludec (TRESIBA FLEXTOUCH) 100 UNIT/ML SOPN Inject 15 Units into the skin at bedtime. Patient taking differently: Inject 10 Units into the skin at bedtime.  11/17/15  Yes Camelia Eng Tysinger, PA-C  losartan (COZAAR) 25 MG tablet Take 1 tablet (25 mg total) by mouth daily. 07/15/15  Yes Camelia Eng Tysinger, PA-C  metoprolol succinate (TOPROL XL) 25 MG 24 hr tablet Take 1 tablet (25 mg total) by mouth daily. 07/15/15  Yes David S Tysinger, PA-C  NEEDLE, DISP, 30 G (BD DISP NEEDLES) 30G X 1/2" MISC 1 each by Does not apply route at bedtime. 11/17/15  Yes Camelia Eng Tysinger, PA-C  nitroGLYCERIN (NITROSTAT) 0.4 MG SL tablet Place 1 tablet (0.4 mg total) under the tongue every 5  (five) minutes as needed for chest pain. 06/17/15  Yes Scott T Kathlen Mody, PA-C  pregabalin (LYRICA) 200 MG capsule Take 1 capsule (200 mg total) by mouth 2 (two) times daily. 11/15/15  Yes Camelia Eng Tysinger, PA-C  traMADol (ULTRAM) 50 MG tablet Take 1 tablet (50 mg total) by mouth every 12 (twelve) hours as needed for moderate pain or severe pain. 11/15/15  Yes Camelia Eng Tysinger, PA-C  dapagliflozin propanediol (FARXIGA) 10 MG TABS tablet Take 10 mg by mouth daily. Patient not taking: Reported on 12/01/2015 11/17/15   Camelia Eng Tysinger, PA-C  metFORMIN (GLUCOPHAGE) 1000 MG tablet Take 1 tablet (1,000 mg total) by mouth 2 (two) times daily with a meal. Patient not taking: Reported on 12/01/2015 07/15/15   Camelia Eng Tysinger, PA-C    Vital Signs: BP 137/66 (BP Location: Left Arm)   Pulse 82   Temp 98.4 F (36.9 C) (Oral)   Resp 16   Ht 5' 11"  (1.803 m)   Wt 169 lb (76.7 kg)   SpO2 100%   BMI 23.57 kg/m   Physical Exam: Abd: soft, NT, drain bag removed (bilious output) and capped with no issues, +BS, drain site is c/d/i  Imaging: Mr Foot Left Wo Contrast  Result Date: 12/02/2015 CLINICAL DATA:  Pt's wounds on his left leg and left small toe were rewrapped. The area has yellowish green drainage and a foul smell. Pt states he has not changed his bandage since Tuesday when  the wound care center changed her bandage . History of diabetes. EXAM: MRI OF THE LEFT FOOT WITHOUT CONTRAST TECHNIQUE: Multiplanar, multisequence MR imaging was performed. No intravenous contrast was administered. COMPARISON:  Left foot x-rays 11/22/2015 FINDINGS: Patient motion degrades image quality limiting evaluation. Bones/Joint/Cartilage Soft tissue ulceration along the dorsal lateral aspect of the fifth phalanx extending down to the cortex. Mild marrow edema within the distal aspect of the fifth proximal phalanx and the base of the fifth middle phalanx without a definitive cortical destruction or periosteal reaction. No other marrow  signal abnormality. No fracture or dislocation. Normal alignment. No joint effusion. Ligaments Collateral ligaments are intact. Muscles and Tendons Visualized flexor, peroneal and extensor compartment tendons are intact. Diffuse T2 hyperintense signal throughout the plantar musculature likely neurogenic. Soft tissue No fluid collection or hematoma. No soft tissue mass. Mild soft tissue edema along the dorsal aspect of the midfoot and forefoot. IMPRESSION: 1. Soft tissue ulceration along the dorsal lateral aspect of the fifth phalanx extending down to the cortex. Mild marrow edema within the distal aspect of the fifth proximal phalanx and the base of the fifth middle phalanx without a definitive cortical destruction or periosteal reaction differential considerations include osteomyelitis versus reactive marrow edema. Electronically Signed   By: Kathreen Devoid   On: 12/02/2015 08:02   Dg C-arm 61-120 Min-no Report  Result Date: 12/02/2015 CLINICAL DATA: ercp C-ARM 61-120 MINUTES Fluoroscopy was utilized by the requesting physician.  No radiographic interpretation.   Ir Int Lianne Cure Biliary Drain With Cholangiogram  Result Date: 12/03/2015 INDICATION: Obstructive jaundice, central pancreatic mass, unsuccessfully ERCP stent insertion. EXAM: IR INT-EXT BILIARY DRAIN W/ CHOLANGIOGRAM MEDICATIONS: 3.375 g Zosyn; The antibiotic was administered within an appropriate time frame prior to the initiation of the procedure. ANESTHESIA/SEDATION: Moderate (conscious) sedation was employed during this procedure. A total of Versed 6 mg and Fentanyl 300 mcg was administered intravenously. Moderate Sedation Time: 73 minutes. The patient's level of consciousness and vital signs were monitored continuously by radiology nursing throughout the procedure under my direct supervision. FLUOROSCOPY TIME:  Fluoroscopy Time: 30 minutes 36 seconds (614 mGy). COMPLICATIONS: None immediate. PROCEDURE: Informed written consent was obtained from the  patient after a thorough discussion of the procedural risks, benefits and alternatives. All questions were addressed. Maximal Sterile Barrier Technique was utilized including caps, mask, sterile gowns, sterile gloves, sterile drape, hand hygiene and skin antiseptic. A timeout was performed prior to the initiation of the procedure. Under sterile conditions and local anesthesia, initially percutaneous ultrasound access performed of the right hepatic lobe in the mid axillary line with a 21 gauge needle. Several passes were made. Eventually a peripheral right hepatic bile duct was accessed for cholangiogram. Cholangiogram performed. This demonstrates minor intrahepatic biliary dilatation. Extrahepatic common bile duct is dilated with obstruction distally. Several additional 21 gauge needle passes were made through the liver with ultrasound and fluoroscopy. Eventually, an inferior right hepatic duct was accessed with a 21 gauge needle more centrally. A 0.018 guidewire was eventually advanced into the common bile duct. Four [place] Accustick dilator set was advanced. Outer dilator was advanced to the common bile duct. Contrast injection confirms position. Amplatz guidewire advanced and crossed into the duodenum. Ten [place] tract dilatation performed. Eventually, a 56 [place] internal external biliary drain was able to be advanced over the Amplatz guidewire with the retention loop formed in the duodenum. Peripheral radiopaque marker within the right hepatic duct. Contrast injection confirms adequate drainage of the right and left hepatic ducts. Catheter secured  with a Prolene retention suture and connected to external gravity drainage. Sterile dressing applied. No immediate complication. Patient tolerated the procedure well. Bile sample sent for Gram stain and culture. IMPRESSION: Difficult but successful ultrasound and fluoroscopically guided right 10 French internal external biliary drain insertion. Electronically Signed    By: Jerilynn Mages.  Shick M.D.   On: 12/03/2015 17:07    Labs:  CBC:  Recent Labs  12/02/15 0409 12/03/15 0343 12/04/15 0422 12/05/15 0449  WBC 7.6 6.8 9.4 9.3  HGB 11.4* 10.8* 11.4* 12.1*  HCT 34.8* 33.3* 34.7* 37.5*  PLT 332 324 332 368    COAGS:  Recent Labs  06/01/15 2015 06/02/15 0300 06/24/15 1024 06/24/15 1858 12/02/15 0409  INR 1.02 1.11 1.04 1.07 1.18  APTT 32  --   --  29  --     BMP:  Recent Labs  12/02/15 0409 12/03/15 0343 12/04/15 0422 12/05/15 0449  NA 136 135 138 140  K 4.4 4.0 3.9 3.6  CL 101 103 105 104  CO2 27 25 24 27   GLUCOSE 202* 334* 160* 92  BUN 7 9 9 10   CALCIUM 9.5 8.8* 9.1 9.4  CREATININE 0.46* 0.64 0.56* 0.54*  GFRNONAA >60 >60 >60 >60  GFRAA >60 >60 >60 >60    LIVER FUNCTION TESTS:  Recent Labs  12/02/15 0409 12/03/15 0343 12/04/15 0422 12/05/15 0449  BILITOT 13.3* 12.2* 8.3* 6.3*  AST 107* 86* 86* 52*  ALT 154* 119* 132* 105*  ALKPHOS 704* 600* 572* 542*  PROT 7.3 6.1* 6.2* 6.7  ALBUMIN 3.1* 2.6* 2.8* 3.1*    Assessment and Plan: 1. S/p I/E biliary drain placement on 8/19 -drain was capped today as his labs and vitals are all improving.   -if this mass is malignant as well as the lesion in his liver, then this may be unresectable by surgery.  Given that, then we could proceed with stenting and removal of PTC drain some time next week if ok with GI.  May want to consider MRCP to better evaluate the pancreatic lesion as well as the liver lesion. -will follow. -flush drain here in the hospital and then continue to flush the daily at home with 5-10cc of NS  Electronically Signed: Cameshia Cressman E 12/05/2015, 11:40 AM   I spent a total of 15 Minutes at the the patient's bedside AND on the patient's hospital floor or unit, greater than 50% of which was counseling/coordinating care for obstructive jaundice

## 2015-12-05 NOTE — Progress Notes (Signed)
Noted that pt's biliary drainage bag has been filling tight with air in addition to the bile output. Inspection of bag, tubing, and insertion site all WNL. Output has also significantly increased throughout the shift- had 400cc from 1700 to 2300, and a total of 950cc plus the air from 0430 to 0530. Paged hospitalist on call, who recommended checking with IR. Discussed the above with IR MD who confirmed that these issues were OK. Also reviewed labs and confirmed IVF rate. Pt with no complaints other than ongoing issue of not being able to keep PO intake down. Continue to monitor. Hortencia Conradi RN

## 2015-12-05 NOTE — Progress Notes (Signed)
PROGRESS NOTE    Donald Steele  AVW:[id number] DOB: [birth year] DOA: 12/01/2015 PCP: Crisoforo Oxford, PA-C    Brief Narrative: HPI: Donald Steele is a 55 y.o. male with medical history significant of ischemic cardiomyopathy last EF 35 to 40 % 2-17,s /p R Fem-Tib bypass. , DM, PVD, L subclavian artery stenting.07/07/15 , he relates weight loss since February, more than 60 pounds, diarrhea and decrease appetite. Report abdominal pain, sometimes after meals.  He relates that he has not been taking his plavix for a week now due to nausea. He has not ben taking most of his medications.   presents for evaluation of pancreatic mass, obstructive jaundice. Sodium at 131, glucose 469, Alk phosphatase 740, albumin 3.3, lipase 13, AST 123, ALT 164, bili 14. CT abdomen pelvis done 8-16; 1. Poorly defined hypodense mass at the pancreatic head, measuring approximately 3.1 x 3.9 x 2.9 cm, highly concerning for primary pancreatic neoplasm. Associated mild pancreatic and biliary ductal dilatation as above. Hypodense lesions at the central aspect of the liver, somewhat indeterminate, and may reflect focally dilated biliary ducts. Possible metastases not entirely excluded. Single small 4 mm nodular density within the superior segment of the right lower lobe, indeterminate. Attention at follow-up recommended. Prominent coronary artery and aorto bi-iliac atherosclerotic disease.   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 IR consulted for stent  placement. Patient S/P intrinsic    Plan for EUS biopsy next  week.  Continue to hold plavix aspirin for stent placement. Continue to hold until biopsy next week.  Vomiting persist.  KUB with duodenal luminal narrowing. Dr hung to follow. Might need stent placement   Left subclavian artery; 06-2015; Discussed with Dr Bridgett Larsson, ok to hold plavix for biopsy next week. And patient has not being taking it.   2-CAD, Cardiomyopathy;  Hold ACE for procedure.  continue with metoprolol.  Resume plavix and aspirin pot procedure when ok by Dr Benson Norway  Lasix.   3-Pancreatic insufficiency; started  pancreatic enzymes.   4-Diabetes; poor oral intake hold lantus.   5-oral candidiasis; nystatin orederd.   6-left fifth toe ulcer; MRI ; 5 th toe with osteomyelitis vs bone marrow edema. Discussed with ID continue with doxy, probably bone marrow edema. Follow up outpatient.  Patient was prescribe Doxy, will continue.  Multiple ulcers LE; wound care consulted.   Urine rentention; if persist might need foley  Started  flomax.    DVT prophylaxis: scd Code Status: full code.  Family Communication: care discussed with patient.  Disposition Plan; might need stent duodenum.  Consultants:   Dr Benson Norway    Procedures:   ERCP   Antimicrobials:  Doxycycline    Subjective: Was able to keep water down.  Just came from [facility].   Objective: Vitals:   12/04/15 0503 12/04/15 1316 12/04/15 2027 12/05/15 0632  BP: (!) 148/69 (!) 195/84 (!) 151/86 137/66  Pulse: 78 90 80 82  Resp: 15 18 15 16   Temp: 97.1 F (36.2 C) 97.8 F (36.6 C) 98.6 F (37 C) 98.4 F (36.9 C)  TempSrc: Oral  Oral Oral Oral  SpO2: 100% 98% 100% 100%  Weight:      Height:        Intake/Output Summary (Last 24 hours) at 12/05/15 1254 Last data filed at 12/05/15 1155  Gross per 24 hour  Intake              460 ml  Output             3525 ml  Net            -3065 ml   Filed Weights   12/03/15 0956  Weight: 76.7 kg (169  lb)    Examination:  General exam: Appears calm and comfortable , icteric.  Respiratory system: Clear to auscultation. Respiratory effort normal. Cardiovascular system: S1 & S2 heard, RRR. No JVD, murmurs, rubs, gallops or clicks. No pedal edema. Gastrointestinal system: Abdomen is nondistended, soft and nontender. No organomegaly or masses felt. Normal bowel sounds heard., drain right side in place with clean dressing  Central nervous system: Alert and oriented. No focal neurological deficits. Extremities: Symmetric 5 x 5 power. Skin: multiples wound. Left fifth toe with ulceration, mild drainage.     Data Reviewed: I have personally reviewed following labs and imaging studies  CBC:  Recent Labs Lab 12/01/15 1621 12/02/15 0409 12/03/15 0343 12/04/15 0422 12/05/15 0449  WBC 7.3 7.6 6.8 9.4 9.3  HGB 11.8* 11.4* 10.8* 11.4* 12.1*  HCT 35.5* 34.8* 33.3* 34.7* 37.5*  MCV 84.5 84.9 85.2 84.6 86.2  PLT 365 332 324 332 704   Basic Metabolic Panel:  Recent Labs Lab 12/01/15 1621 12/02/15 0409 12/03/15 0343 12/04/15 0422 12/05/15 0449  NA 131* 136 135 138 140  K 3.7 4.4 4.0 3.9 3.6  CL 95* 101 103 105 104  CO2 25 27 25 24 27   GLUCOSE 469* 202* 334* 160* 92  BUN 10 7 9 9 10   CREATININE 0.60* 0.46* 0.64 0.56* 0.54*  CALCIUM 9.3 9.5 8.8* 9.1 9.4   GFR: Estimated Creatinine Clearance: 111.1 mL/min (by C-G formula based on SCr of 0.8 mg/dL). Liver Function Tests:  Recent Labs Lab 12/01/15 1621 12/02/15 0409 12/03/15 0343 12/04/15 0422 12/05/15 0449  AST 123* 107* 86* 86* 52*  ALT 164* 154* 119* 132* 105*  ALKPHOS 740* 704* 600* 572* 542*  BILITOT 14.3* 13.3* 12.2* 8.3* 6.3*  PROT 7.7 7.3 6.1* 6.2* 6.7  ALBUMIN 3.3* 3.1* 2.6* 2.8* 3.1*    Recent Labs Lab 12/01/15 1621  LIPASE 13   No results for input(s): AMMONIA in the last 168 hours. Coagulation Profile:  Recent Labs Lab 12/02/15 0409  INR 1.18   Cardiac Enzymes: No results for input(s): CKTOTAL,  CKMB, CKMBINDEX, TROPONINI in the last 168 hours. BNP (last 3 results) No results for input(s): PROBNP in the last 8760 hours. HbA1C: No results for input(s): HGBA1C in the last 72 hours. CBG:  Recent Labs Lab 12/04/15 1240 12/04/15 1729 12/04/15 2128 12/05/15 0759 12/05/15 1234  GLUCAP 160* 151* 131* 91 106*   Lipid Profile: No results for input(s): CHOL, HDL, LDLCALC, TRIG, CHOLHDL, LDLDIRECT in the last 72 hours. Thyroid Function Tests: No results for input(s): TSH, T4TOTAL, FREET4, T3FREE, THYROIDAB in the last 72 hours. Anemia Panel: No results for input(s): VITAMINB12, FOLATE, FERRITIN, TIBC, IRON, RETICCTPCT in the last 72 hours. Sepsis Labs: No results for input(s): PROCALCITON, LATICACIDVEN in the last 168 hours.  Recent Results (from the past 240 hour(s))  Aerobic/Anaerobic Culture (surgical/deep wound)     Status: None (Preliminary result)  Collection Time: 12/03/15  4:39 PM  Result Value Ref Range Status   Specimen Description BILE  Final   Special Requests Normal  Final   Gram Stain   Final    FEW WBC PRESENT, PREDOMINANTLY MONONUCLEAR NO ORGANISMS SEEN    Culture   Final    FEW YEAST CRITICAL RESULT CALLED TO, READ BACK BY AND VERIFIED WITH: E BETHEL,RN AT 1225 12/04/15 BY L BENFIELD Performed at [facility]    Report Status PENDING  Incomplete         Radiology Studies: Dg Ugi  W/kub  Result Date: 12/05/2015 CLINICAL DATA:  Pancreas cancer.  Evaluate for obstruction. EXAM: UPPER GI SERIES WITH KUB TECHNIQUE: After obtaining a scout radiograph a routine upper GI series was performed using thin barium FLUOROSCOPY TIME:  Radiation Exposure Index (as provided by the fluoroscopic device): 107.4 mGy COMPARISON:  None. FINDINGS: On the scout radiograph there is a percutaneous transhepatic biliary drainage catheter overlying the right upper quadrant of the abdomen. Enteric contrast material from recent CT of the abdomen and pelvis is identified  throughout the colon up to the level of the rectum. The patient ingested approximately 8 ounces of thin barium. When the patient was placed in the right lateral orientation there was prompt emptying of the stomach into the duodenum. There is a focal area of marked circumferential luminal narrowing involving the proximal portion of the descending duodenum. Here, the lumen of the duodenum is narrowed to approximately 4 mm. IMPRESSION: 1. Focal area of marked luminal narrowing involving the proximal portion of the descending duodenum secondary to pancreas mass. Here, the lumen of the duodenum measures approximately 4 mm. Electronically Signed   By: Kerby Moors M.D.   On: 12/05/2015 12:04   Ir Int Lianne Cure Biliary Drain With Cholangiogram  Result Date: 12/03/2015 INDICATION: Obstructive jaundice, central pancreatic mass, unsuccessfully ERCP stent insertion. EXAM: IR INT-EXT BILIARY DRAIN W/ CHOLANGIOGRAM MEDICATIONS: 3.375 g Zosyn; The antibiotic was administered within an appropriate time frame prior to the initiation of the procedure. ANESTHESIA/SEDATION: Moderate (conscious) sedation was employed during this procedure. A total of Versed 6 mg and Fentanyl 300 mcg was administered intravenously. Moderate Sedation Time: 73 minutes. The patient's level of consciousness and vital signs were monitored continuously by radiology nursing throughout the procedure under my direct supervision. FLUOROSCOPY TIME:  Fluoroscopy Time: 30 minutes 36 seconds (614 mGy). COMPLICATIONS: None immediate. PROCEDURE: Informed written consent was obtained from the patient after a thorough discussion of the procedural risks, benefits and alternatives. All questions were addressed. Maximal Sterile Barrier Technique was utilized including caps, mask, sterile gowns, sterile gloves, sterile drape, hand hygiene and skin antiseptic. A timeout was performed prior to the initiation of the procedure. Under sterile conditions and local anesthesia,  initially percutaneous ultrasound access performed of the right hepatic lobe in the mid axillary line with a 21 gauge needle. Several passes were made. Eventually a peripheral right hepatic bile duct was accessed for cholangiogram. Cholangiogram performed. This demonstrates minor intrahepatic biliary dilatation. Extrahepatic common bile duct is dilated with obstruction distally. Several additional 21 gauge needle passes were made through the liver with ultrasound and fluoroscopy. Eventually, an inferior right hepatic duct was accessed with a 21 gauge needle more centrally. A 0.018 guidewire was eventually advanced into the common bile duct. Four [place] Accustick dilator set was advanced. Outer dilator was advanced to the common bile duct. Contrast injection confirms position. Amplatz guidewire advanced and crossed into the duodenum. Ten [place] tract dilatation  performed. Eventually, a 99 [place] internal external biliary drain was able to be advanced over the Amplatz guidewire with the retention loop formed in the duodenum. Peripheral radiopaque marker within the right hepatic duct. Contrast injection confirms adequate drainage of the right and left hepatic ducts. Catheter secured with a Prolene retention suture and connected to external gravity drainage. Sterile dressing applied. No immediate complication. Patient tolerated the procedure well. Bile sample sent for Gram stain and culture. IMPRESSION: Difficult but successful ultrasound and fluoroscopically guided right 10 French internal external biliary drain insertion. Electronically Signed   By: Jerilynn Mages.  Shick M.D.   On: 12/03/2015 17:07        Scheduled Meds: . collagenase   Topical Daily  . doxycycline  100 mg Oral Q12H  . fluconazole (DIFLUCAN) IV  100 mg Intravenous Q24H  . folic acid  1 mg Oral Daily  . furosemide  20 mg Oral Daily  . insulin aspart  0-9 Units Subcutaneous TID WC  . lipase/protease/amylase  36,000 Units Oral TID AC  . metoprolol  succinate  25 mg Oral Daily  . multivitamin with minerals  1 tablet Oral Daily  . nystatin  5 mL Oral QID  . ondansetron  4 mg Oral TID  . pregabalin  200 mg Oral BID  . tamsulosin  0.4 mg Oral Daily   Continuous Infusions:     LOS: 4 days    Time spent: 35 minutes.     Elmarie Shiley, MD [facility] Pager [phone]  If 7PM-7AM, please contact night-coverage [email or web address] Password [facility] 12/05/2015, 12:54 PM

## 2015-12-05 NOTE — Progress Notes (Signed)
Wife insists  On doing dressing changes the way the wound center told them to do. Which is dry dressing. Appetite is poor. Would only take a couple of his pills because he was too nauseated to take any more. Biliary drained flushed with 5 ml NS. Dressing changed around biliary drain

## 2015-12-05 NOTE — Progress Notes (Signed)
Subjective: No complaints.    Objective: Vital signs in last 24 hours: Temp:  [98.2 F (36.8 C)-98.6 F (37 C)] 98.2 F (36.8 C) (08/21 1453) Pulse Rate:  [77-82] 77 (08/21 1453) Resp:  [15-16] 16 (08/21 1453) BP: (126-151)/(66-86) 126/78 (08/21 1453) SpO2:  [100 %] 100 % (08/21 1453) Last BM Date: 12/01/15  Intake/Output from previous day: 08/20 0701 - 08/21 0700 In: 1055 [P.O.:1055] Out: 3200 [Urine:775; Drains:2425] Intake/Output this shift: Total I/O In: -  Out: 1175 [Urine:600; Drains:575]  General appearance: alert and no distress GI: soft, non-tender; bowel sounds normal; no masses,  no organomegaly  Lab Results:  Recent Labs  12/03/15 0343 12/04/15 0422 12/05/15 0449  WBC 6.8 9.4 9.3  HGB 10.8* 11.4* 12.1*  HCT 33.3* 34.7* 37.5*  PLT 324 332 368   BMET  Recent Labs  12/03/15 0343 12/04/15 0422 12/05/15 0449  NA 135 138 140  K 4.0 3.9 3.6  CL 103 105 104  CO2 25 24 27   GLUCOSE 334* 160* 92  BUN 9 9 10   CREATININE 0000000 0.56* 0.54*  CALCIUM 8.8* 9.1 9.4   LFT  Recent Labs  12/05/15 0449  PROT 6.7  ALBUMIN 3.1*  AST 52*  ALT 105*  ALKPHOS 542*  BILITOT 6.3*   PT/INR No results for input(s): LABPROT, INR in the last 72 hours. Hepatitis Panel No results for input(s): HEPBSAG, HCVAB, HEPAIGM, HEPBIGM in the last 72 hours. C-Diff No results for input(s): CDIFFTOX in the last 72 hours. Fecal Lactopherrin No results for input(s): FECLLACTOFRN in the last 72 hours.  Studies/Results: Dg Ugi  W/kub  Result Date: 12/05/2015 CLINICAL DATA:  Pancreas cancer.  Evaluate for obstruction. EXAM: UPPER GI SERIES WITH KUB TECHNIQUE: After obtaining a scout radiograph a routine upper GI series was performed using thin barium FLUOROSCOPY TIME:  Radiation Exposure Index (as provided by the fluoroscopic device): 107.4 mGy COMPARISON:  None. FINDINGS: On the scout radiograph there is a percutaneous transhepatic biliary drainage catheter overlying the right  upper quadrant of the abdomen. Enteric contrast material from recent CT of the abdomen and pelvis is identified throughout the colon up to the level of the rectum. The patient ingested approximately 8 ounces of thin barium. When the patient was placed in the right lateral orientation there was prompt emptying of the stomach into the duodenum. There is a focal area of marked circumferential luminal narrowing involving the proximal portion of the descending duodenum. Here, the lumen of the duodenum is narrowed to approximately 4 mm. IMPRESSION: 1. Focal area of marked luminal narrowing involving the proximal portion of the descending duodenum secondary to pancreas mass. Here, the lumen of the duodenum measures approximately 4 mm. Electronically Signed   By: Kerby Moors M.D.   On: 12/05/2015 12:04    Medications:  Scheduled: . collagenase   Topical Daily  . doxycycline  100 mg Oral Q12H  . fluconazole (DIFLUCAN) IV  100 mg Intravenous Q24H  . folic acid  1 mg Oral Daily  . furosemide  20 mg Oral Daily  . insulin aspart  0-9 Units Subcutaneous TID WC  . lipase/protease/amylase  36,000 Units Oral TID AC  . metoprolol succinate  25 mg Oral Daily  . multivitamin with minerals  1 tablet Oral Daily  . nystatin  5 mL Oral QID  . ondansetron  4 mg Oral TID  . pregabalin  200 mg Oral BID  . tamsulosin  0.4 mg Oral Daily   Continuous:   Assessment/Plan: 1)  Pancreatic head mass. 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 3) Nausea and vomiting.   I discussed the plan for the patient.  Tomorrow I will pursue an EUS with FNA and then perform an EGD with stent placement.  Hopefully he will be able to tolerate both procedures.  Plan: 1) As above.  LOS: 4 days   Gerhardt Gleed D 12/05/2015, 5:26 PM

## 2015-12-06 ENCOUNTER — Encounter (HOSPITAL_COMMUNITY): Payer: BLUE CROSS/BLUE SHIELD

## 2015-12-06 ENCOUNTER — Encounter (HOSPITAL_COMMUNITY)
Admission: EM | Disposition: A | Discharge status: Home Health | Payer: Self-pay | Source: Home / Self Care | Attending: Internal Medicine

## 2015-12-06 ENCOUNTER — Encounter (HOSPITAL_COMMUNITY): Payer: Self-pay

## 2015-12-06 ENCOUNTER — Inpatient Hospital Stay (HOSPITAL_COMMUNITY): Payer: BLUE CROSS/BLUE SHIELD | Admitting: Anesthesiology

## 2015-12-06 ENCOUNTER — Inpatient Hospital Stay (HOSPITAL_COMMUNITY): Payer: BLUE CROSS/BLUE SHIELD

## 2015-12-06 ENCOUNTER — Other Ambulatory Visit (HOSPITAL_COMMUNITY): Payer: BLUE CROSS/BLUE SHIELD

## 2015-12-06 ENCOUNTER — Ambulatory Visit: Payer: BLUE CROSS/BLUE SHIELD | Admitting: Vascular Surgery

## 2015-12-06 HISTORY — PX: DUODENAL STENT PLACEMENT: SHX5541

## 2015-12-06 HISTORY — PX: ESOPHAGOGASTRODUODENOSCOPY (EGD) WITH PROPOFOL: SHX5813

## 2015-12-06 HISTORY — PX: EUS: SHX5427

## 2015-12-06 LAB — COMPREHENSIVE METABOLIC PANEL
ALBUMIN: 3.1 g/dL — AB (ref 3.5–5.0)
ALK PHOS: 514 U/L — AB (ref 38–126)
ALT: 100 U/L — ABNORMAL HIGH (ref 17–63)
ANION GAP: 10 (ref 5–15)
AST: 62 U/L — AB (ref 15–41)
BILIRUBIN TOTAL: 5.5 mg/dL — AB (ref 0.3–1.2)
BUN: 16 mg/dL (ref 6–20)
CO2: 26 mmol/L (ref 22–32)
CREATININE: 0.66 mg/dL (ref 0.61–1.24)
Calcium: 9.4 mg/dL (ref 8.9–10.3)
Chloride: 102 mmol/L (ref 101–111)
GFR calc Af Amer: >60 mL/min (ref >60)
GFR calc non Af Amer: >60 mL/min (ref >60)
GLUCOSE: 92 mg/dL (ref 65–99)
POTASSIUM: 3.7 mmol/L (ref 3.5–5.1)
SODIUM: 138 mmol/L (ref 135–145)
Total Protein: 7.3 g/dL (ref 6.5–8.1)

## 2015-12-06 LAB — CBC WITH DIFFERENTIAL/PLATELET
BASOS ABS: 0.1 10*3/uL (ref 0.0–0.1)
BASOS PCT: 1 %
EOS ABS: 0.3 10*3/uL (ref 0.0–0.7)
Eosinophils Relative: 3 %
HEMATOCRIT: 36.5 % — AB (ref 39.0–52.0)
HEMOGLOBIN: 11.4 g/dL — AB (ref 13.0–17.0)
Lymphocytes Relative: 33 %
Lymphs Abs: 2.8 10*3/uL (ref 0.7–4.0)
MCH: 27.4 pg (ref 26.0–34.0)
MCHC: 31.2 g/dL (ref 30.0–36.0)
MCV: 87.7 fL (ref 78.0–100.0)
Monocytes Absolute: 0.8 10*3/uL (ref 0.1–1.0)
Monocytes Relative: 9 %
NEUTROS ABS: 4.6 10*3/uL (ref 1.7–7.7)
NEUTROS PCT: 54 %
Platelets: 390 10*3/uL (ref 150–400)
RBC: 4.16 MIL/uL — ABNORMAL LOW (ref 4.22–5.81)
RDW: 14.9 % (ref 11.5–15.5)
WBC: 8.5 10*3/uL (ref 4.0–10.5)

## 2015-12-06 LAB — GLUCOSE, CAPILLARY
GLUCOSE-CAPILLARY: 86 mg/dL (ref 65–99)
GLUCOSE-CAPILLARY: 93 mg/dL (ref 65–99)
Glucose-Capillary: 79 mg/dL (ref 65–99)
Glucose-Capillary: 111 mg/dL — ABNORMAL HIGH (ref 65–99)

## 2015-12-06 SURGERY — UPPER ENDOSCOPIC ULTRASOUND (EUS) LINEAR
Anesthesia: General

## 2015-12-06 MED ORDER — LIDOCAINE HCL (CARDIAC) 20 MG/ML IV SOLN
INTRAVENOUS | Status: AC
  Filled 2015-12-06: qty 5

## 2015-12-06 MED ORDER — COLLAGENASE 250 UNIT/GM EX OINT
TOPICAL_OINTMENT | Freq: Every day | CUTANEOUS | Status: DC
  Filled 2015-12-06: qty 30

## 2015-12-06 MED ORDER — PROPOFOL 10 MG/ML IV BOLUS
INTRAVENOUS | Status: AC
  Filled 2015-12-06: qty 20

## 2015-12-06 MED ORDER — METOPROLOL TARTRATE 5 MG/5ML IV SOLN
INTRAVENOUS | Status: AC
  Filled 2015-12-06: qty 5

## 2015-12-06 MED ORDER — METOPROLOL TARTRATE 5 MG/5ML IV SOLN
INTRAVENOUS | Status: DC | PRN
  Administered 2015-12-06 (×2): 2.5 mg via INTRAVENOUS

## 2015-12-06 MED ORDER — LIDOCAINE HCL (CARDIAC) 20 MG/ML IV SOLN
INTRAVENOUS | Status: DC | PRN
  Administered 2015-12-06: 30 mg via INTRAVENOUS

## 2015-12-06 MED ORDER — FENTANYL CITRATE (PF) 100 MCG/2ML IJ SOLN
INTRAMUSCULAR | Status: AC
  Filled 2015-12-06: qty 2

## 2015-12-06 MED ORDER — LACTATED RINGERS IV SOLN
INTRAVENOUS | Status: DC | PRN
  Administered 2015-12-06: 14:00:00 via INTRAVENOUS

## 2015-12-06 MED ORDER — PROPOFOL 500 MG/50ML IV EMUL
INTRAVENOUS | Status: DC | PRN
  Administered 2015-12-06: 30 mg via INTRAVENOUS
  Administered 2015-12-06: 10 mg via INTRAVENOUS

## 2015-12-06 MED ORDER — METOPROLOL TARTRATE 5 MG/5ML IV SOLN
5.0000 mg | Freq: Once | INTRAVENOUS | Status: AC
  Administered 2015-12-06: 5 mg via INTRAVENOUS
  Filled 2015-12-06: qty 5

## 2015-12-06 MED ORDER — PHENYLEPHRINE HCL 10 MG/ML IJ SOLN
INTRAMUSCULAR | Status: AC
  Filled 2015-12-06: qty 1

## 2015-12-06 MED ORDER — PROPOFOL 500 MG/50ML IV EMUL
INTRAVENOUS | Status: DC | PRN
  Administered 2015-12-06: 100 ug/kg/min via INTRAVENOUS

## 2015-12-06 MED ORDER — SODIUM CHLORIDE 0.9 % IV SOLN
INTRAVENOUS | Status: DC
  Administered 2015-12-06: 18:00:00 via INTRAVENOUS

## 2015-12-06 MED ORDER — PROPOFOL 10 MG/ML IV BOLUS
INTRAVENOUS | Status: AC
  Filled 2015-12-06: qty 40

## 2015-12-06 MED ORDER — ONDANSETRON HCL 4 MG/2ML IJ SOLN
INTRAMUSCULAR | Status: AC
  Filled 2015-12-06: qty 2

## 2015-12-06 SURGICAL SUPPLY — 14 items

## 2015-12-06 NOTE — Transfer of Care (Signed)
Immediate Anesthesia Transfer of Care Note  Patient: Donald Steele  Procedure(s) Performed: Procedure(s): UPPER ENDOSCOPIC ULTRASOUND (EUS) LINEAR (N/A) ESOPHAGOGASTRODUODENOSCOPY (EGD) WITH PROPOFOL (N/A) DUODENAL STENT PLACEMENT (N/A)  Patient Location: PACU  Anesthesia Type:MAC  Level of Consciousness: awake, alert  and oriented  Airway & Oxygen Therapy: Patient Spontanous Breathing and Patient connected to nasal cannula oxygen  Post-op Assessment: Report given to RN and Post -op Vital signs reviewed and stable  Post vital signs: Reviewed and stable  Last Vitals:  Vitals:   12/06/15 1315 12/06/15 1329  BP: 135/67 (!) 117/58  Pulse: 63 72  Resp: 16   Temp: 36.8 C 36.8 C    Last Pain:  Vitals:   12/06/15 1315  TempSrc: Oral  PainSc:          Complications: No apparent anesthesia complications

## 2015-12-06 NOTE — Anesthesia Postprocedure Evaluation (Signed)
Anesthesia Post Note  Patient: Donald Steele  Procedure(s) Performed: Procedure(s) (LRB): UPPER ENDOSCOPIC ULTRASOUND (EUS) LINEAR (N/A) ESOPHAGOGASTRODUODENOSCOPY (EGD) WITH PROPOFOL (N/A) DUODENAL STENT PLACEMENT (N/A)  Patient location during evaluation: PACU Anesthesia Type: MAC Level of consciousness: awake and alert Pain management: pain level controlled Vital Signs Assessment: post-procedure vital signs reviewed and stable Respiratory status: spontaneous breathing, nonlabored ventilation, respiratory function stable and patient connected to nasal cannula oxygen Cardiovascular status: stable and blood pressure returned to baseline Anesthetic complications: no    Last Vitals:  Vitals:   12/06/15 1315 12/06/15 1329  BP: 135/67 (!) 117/58  Pulse: 63 72  Resp: 16   Temp: 36.8 C 36.8 C    Last Pain:  Vitals:   12/06/15 1315  TempSrc: Oral  PainSc:                  [name]

## 2015-12-06 NOTE — H&P (View-Only) (Signed)
Subjective: No complaints.    Objective: Vital signs in last 24 hours: Temp:  [98.2 F (36.8 C)-98.6 F (37 C)] 98.2 F (36.8 C) (08/21 1453) Pulse Rate:  [77-82] 77 (08/21 1453) Resp:  [15-16] 16 (08/21 1453) BP: (126-151)/(66-86) 126/78 (08/21 1453) SpO2:  [100 %] 100 % (08/21 1453) Last BM Date: 12/01/15  Intake/Output from previous day: 08/20 0701 - 08/21 0700 In: 1055 [P.O.:1055] Out: 3200 [Urine:775; Drains:2425] Intake/Output this shift: Total I/O In: -  Out: 1175 [Urine:600; Drains:575]  General appearance: alert and no distress GI: soft, non-tender; bowel sounds normal; no masses,  no organomegaly  Lab Results:  Recent Labs  12/03/15 0343 12/04/15 0422 12/05/15 0449  WBC 6.8 9.4 9.3  HGB 10.8* 11.4* 12.1*  HCT 33.3* 34.7* 37.5*  PLT 324 332 368   BMET  Recent Labs  12/03/15 0343 12/04/15 0422 12/05/15 0449  NA 135 138 140  K 4.0 3.9 3.6  CL 103 105 104  CO2 25 24 27   GLUCOSE 334* 160* 92  BUN 9 9 10   CREATININE 0.64 0.56* 0.54*  CALCIUM 8.8* 9.1 9.4   LFT  Recent Labs  12/05/15 0449  PROT 6.7  ALBUMIN 3.1*  AST 52*  ALT 105*  ALKPHOS 542*  BILITOT 6.3*   PT/INR No results for input(s): LABPROT, INR in the last 72 hours. Hepatitis Panel No results for input(s): HEPBSAG, HCVAB, HEPAIGM, HEPBIGM in the last 72 hours. C-Diff No results for input(s): CDIFFTOX in the last 72 hours. Fecal Lactopherrin No results for input(s): FECLLACTOFRN in the last 72 hours.  Studies/Results: Dg Ugi  W/kub  Result Date: 12/05/2015 CLINICAL DATA:  Pancreas cancer.  Evaluate for obstruction. EXAM: UPPER GI SERIES WITH KUB TECHNIQUE: After obtaining a scout radiograph a routine upper GI series was performed using thin barium FLUOROSCOPY TIME:  Radiation Exposure Index (as provided by the fluoroscopic device): 107.4 mGy COMPARISON:  None. FINDINGS: On the scout radiograph there is a percutaneous transhepatic biliary drainage catheter overlying the right  upper quadrant of the abdomen. Enteric contrast material from recent CT of the abdomen and pelvis is identified throughout the colon up to the level of the rectum. The patient ingested approximately 8 ounces of thin barium. When the patient was placed in the right lateral orientation there was prompt emptying of the stomach into the duodenum. There is a focal area of marked circumferential luminal narrowing involving the proximal portion of the descending duodenum. Here, the lumen of the duodenum is narrowed to approximately 4 mm. IMPRESSION: 1. Focal area of marked luminal narrowing involving the proximal portion of the descending duodenum secondary to pancreas mass. Here, the lumen of the duodenum measures approximately 4 mm. Electronically Signed   By: Kerby Moors M.D.   On: 12/05/2015 12:04    Medications:  Scheduled: . collagenase   Topical Daily  . doxycycline  100 mg Oral Q12H  . fluconazole (DIFLUCAN) IV  100 mg Intravenous Q24H  . folic acid  1 mg Oral Daily  . furosemide  20 mg Oral Daily  . insulin aspart  0-9 Units Subcutaneous TID WC  . lipase/protease/amylase  36,000 Units Oral TID AC  . metoprolol succinate  25 mg Oral Daily  . multivitamin with minerals  1 tablet Oral Daily  . nystatin  5 mL Oral QID  . ondansetron  4 mg Oral TID  . pregabalin  200 mg Oral BID  . tamsulosin  0.4 mg Oral Daily   Continuous:   Assessment/Plan: 1)  Pancreatic head mass. 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 3) Nausea and vomiting.   I discussed the plan for the patient.  Tomorrow I will pursue an EUS with FNA and then perform an EGD with stent placement.  Hopefully he will be able to tolerate both procedures.  Plan: 1) As above.  LOS: 4 days   Maizie Garno D 12/05/2015, 5:26 PM

## 2015-12-06 NOTE — Op Note (Signed)
[facility] Patient Name: Donald Steele Procedure Date: 12/06/2015 MRN: [id number] Attending MD: Carol Ada , MD Date of Birth: 06/03/1960 CSN: [id number] Age: 55 Admit Type: Inpatient Procedure:                Upper EUS Indications:              Suspected solid pancreatic neoplasm Providers:                Carol Ada, MD, Malka So, RN, William Dalton, Technician Referring MD:              Medicines:                Propofol per Anesthesia Complications:            No immediate complications. Estimated Blood Loss:     Estimated blood loss was minimal. Procedure:                Pre-Anesthesia Assessment:                           - Prior to the procedure, a History and Physical                            was performed, and patient medications and                            allergies were reviewed. The patient's tolerance of                            previous anesthesia was also reviewed. The risks                            and benefits of the procedure and the sedation                            options and risks were discussed with the patient.                            All questions were answered, and informed consent                            was obtained. Prior Anticoagulants: The patient has                            taken no previous anticoagulant or antiplatelet                            agents. ASA Grade Assessment: III - A patient with                            severe systemic disease. After reviewing the risks  and benefits, the patient was deemed in                            satisfactory condition to undergo the procedure.                           After obtaining informed consent, the endoscope was                            passed under direct vision. Throughout the                            procedure, the patient's blood pressure, pulse, and                            oxygen  saturations were monitored continuously. The                            [id number]) scope was introduced through                            the mouth, and advanced to the second part of                            duodenum. The [id number]) scope was                            introduced through the mouth, and advanced to the                            second part of duodenum. The upper EUS was                            technically difficult and complex due to abnormal                            anatomy. The patient tolerated the procedure well. Scope In: Scope Out: Findings:      Endoscopic Finding :      The examined esophagus was endoscopically normal.      The entire examined stomach was endoscopically normal.      An acquired extrinsic severe stenosis was found in the duodenal bulb and       was traversed. This was stented with a 22 mm x 60 mm enteral Wallstent       and a 22 mm x 90 mm enteral Wallstent.      One non-bleeding cratered duodenal ulcer with no stigmata of bleeding       was found in the first portion of the duodenum. The lesion was 10 mm in       largest dimension. Biopsies were taken with a cold forceps for histology.      Endosonographic Finding :      A round mass was identified in the pancreatic head. The mass was       hypoechoic. The mass measured 36 mm by 31 mm in maximal cross-sectional  diameter. The outer margins were irregular. There was sonographic       evidence suggesting invasion into the portal vein (manifested by       interface loss greater than or equal to 15 mm). An intact interface was       seen between the mass and the superior mesenteric artery and celiac       trunk suggesting a lack of invasion. The remainder of the pancreas was       examined. The endosonographic appearance of parenchyma and the upstream       pancreatic duct indicated parenchymal atrophy and 4 mm ductal dilation       as well as side branch duct  dilatio. Fine needle aspiration for cytology       was performed. Color Doppler imaging was utilized prior to needle       puncture to confirm a lack of significant vascular structures within the       needle path. Five passes were made with the 25 gauge needle using a       transduodenal approach. A stylet was used. A cytologist was present and       performed a preliminary cytologic examination. The cellularity of the       specimen was adequate. Final cytology results are pending.      The EUS examination revealed a hypoechoic irregularly bordered mass       measuring 36 x 31 mm. Five passes with the 25 gauge FNA needle was used       and penetrating the pass was difficult. The lesion was extremely dense,       but a good sampling was obtained. The IR biliary stent was noted on       ultrasound and endoscopically. There was dilation of the PD upstream.       After sampling the mass attention was turned to the extrinsic malignant       stenosis. The stenosis was severe, but pliable. There was a large 1 cm       cratered ulcer and it is most likely malignant involvement into the       duodenal lumen. Cold biopsies were obtained of the ulcer edge. Next a 22       x 60 mm metallic duodenal stent was deployed, but the positioning of the       stent was difficult as a result of the sharp angulation secondary to the       stenosis. The endoscope postioning was tenuous to allow for good       positioning of the stent. Attempts were made with the rat-toothed       forceps to pull the stent across the malignant stenosis after stent       deployment, but the edge was not able to be lifted up above the lip of       the stenosis. A second stent was then deployed through the index stent.       This stent was 22 x 90 mm. Again, position was difficult, but with slow       deployement and multiple repositionings and rechecks, the secondary       stent was successfully deployed through the stenosis. The  endoscope was       able to traverse one contiguous stenting across the stenosis. Impression:               - Normal esophagus.                           -  Normal stomach.                           - Acquired duodenal stenosis. Prosthesis placed.                           - One non-bleeding duodenal ulcer with no stigmata                            of bleeding. Biopsied.                           - A mass was identified in the pancreatic head.                            Fine needle aspiration performed.                           - Check KUB for stent placement. Moderate Sedation:      N/A- Per Anesthesia Care Recommendation:           - Return patient to hospital ward for ongoing care.                           - Clear liquid diet.                           - Await cytology results and await path results. Procedure Code(s):        --- Professional ---                           [phone], Esophagogastroduodenoscopy, flexible,                            transoral; with transendoscopic ultrasound-guided                            intramural or transmural fine needle                            aspiration/biopsy(s), (includes endoscopic                            ultrasound examination limited to the esophagus,                            stomach or duodenum, and adjacent structures)                           43266, Esophagogastroduodenoscopy, flexible,                            transoral; with placement of endoscopic stent                            (includes pre- and post-dilation and guide wire  passage, when performed)                           43239, 33, Esophagogastroduodenoscopy, flexible,                            transoral; with biopsy, single or multiple Diagnosis Code(s):        --- Professional ---                           K31.5, Obstruction of duodenum                           K26.9, Duodenal ulcer, unspecified as acute or                             chronic, without hemorrhage or perforation                           K86.89, Other specified diseases of pancreas CPT copyright 2016 [employer]. All rights reserved. The codes documented in this report are preliminary and upon coder review may  be revised to meet current compliance requirements. Carol Ada, MD Carol Ada, MD 12/06/2015 4:00:17 PM This report has been signed electronically. Number of Addenda: 0

## 2015-12-06 NOTE — Progress Notes (Signed)
PROGRESS NOTE    Donald Steele  [id number] DOB: [birth year] DOA: 12/01/2015 PCP: Crisoforo Oxford, PA-C    Brief Narrative: HPI: Donald Steele is a 55 y.o. male with medical history significant of ischemic cardiomyopathy last EF 35 to 40 % 2-17,s /p R Fem-Tib bypass. , DM, PVD, L subclavian artery stenting.07/07/15 , he relates weight loss since February, more than 60 pounds, diarrhea and decrease appetite. Report abdominal pain, sometimes after meals.  He relates that he has not been taking his plavix for a week now due to nausea. He has not ben taking most of his medications.   presents for evaluation of pancreatic mass, obstructive jaundice. Sodium at 131, glucose 469, Alk phosphatase 740, albumin 3.3, lipase 13, AST 123, ALT 164, bili 14. CT abdomen pelvis done 8-16; 1. Poorly defined hypodense mass at the pancreatic head, measuring approximately 3.1 x 3.9 x 2.9 cm, highly concerning for primary pancreatic neoplasm. Associated mild pancreatic and biliary ductal dilatation as above. Hypodense lesions at the central aspect of the liver, somewhat indeterminate, and may reflect focally dilated biliary ducts. Possible metastases not entirely excluded. Single small 4 mm nodular density within the superior segment of the right lower lobe, indeterminate. Attention at follow-up recommended. Prominent coronary artery and aorto bi-iliac atherosclerotic disease.   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 IR consulted for stent  placement. Patient S/P intrinsic    Plan for EUS biopsy next  week.  Continue to hold plavix aspirin for stent placement. Continue to hold until biopsy next week.  Vomit persist.  KUB with duodenal luminal narrowing. Dr hung planning stent placement 8-22 and biopsy.   Left subclavian artery; 06-2015; Discussed with Dr Bridgett Larsson, ok to hold plavix for biopsy. . And patient has not being taking it.   2-CAD, Cardiomyopathy;  Hold ACE for procedure.  continue with metoprolol.  Resume plavix and aspirin pot procedure when ok by Dr Benson Norway  Lasix.   3-Pancreatic insufficiency; started  pancreatic enzymes.   4-Diabetes; poor oral intake hold lantus.   5-oral candidiasis; nystatin orederd.   6-left fifth toe ulcer; MRI ; 5 th toe with osteomyelitis vs bone marrow edema. Discussed with ID continue with doxy, probably bone marrow edema. Follow up outpatient.  Patient was prescribe Doxy, will continue.  Multiple ulcers LE; wound care consulted.   Urine rentention; if persist might need foley  Started  flomax.    DVT prophylaxis: scd Code Status: full code.  Family Communication: care discussed with patient.  Disposition Plan; await stent placement and will need to see if patient tolerates diet.   Consultants:   Dr Benson Norway    Procedures:   ERCP   Antimicrobials:  Doxycycline    Subjective: Awaiting for procedure    Objective: Vitals:   12/05/15 0632 12/05/15 1453 12/06/15 0132 12/06/15 0655  BP: 137/66 126/78 140/75 118/67  Pulse: 82 77 70 64  Resp: 16 16 18 18   Temp: 98.4 F (36.9 C) 98.2 F (36.8 C) 98.3 F (36.8 C) 98.2 F (36.8 C)  TempSrc: Oral Oral Oral  Oral  SpO2: 100% 100% 100% 100%  Weight:      Height:        Intake/Output Summary (Last 24 hours) at 12/06/15 0839 Last data filed at 12/06/15 0050  Gross per 24 hour  Intake              340 ml  Output             2076 ml  Net            -1736 ml   Filed Weights   12/03/15 0956  Weight: 76.7 kg (169  lb)    Examination:  General exam: Appears calm and comfortable , icteric.  Respiratory system: Clear to auscultation. Respiratory effort normal. Cardiovascular system: S1 & S2 heard, RRR. No JVD, murmurs, rubs, gallops or clicks. No pedal edema. Gastrointestinal system: Abdomen is nondistended, soft and nontender. No organomegaly or masses felt. Normal bowel sounds heard., drain right side in place with clean dressing  Central nervous system: Alert and oriented. No focal neurological deficits. Extremities: Symmetric 5 x 5 power. Skin: multiples wound. Left fifth toe with ulceration, mild drainage.     Data Reviewed: I have personally reviewed following labs and imaging studies  CBC:  Recent Labs Lab 12/01/15 1621 12/02/15 0409 12/03/15 0343 12/04/15 0422 12/05/15 0449  WBC 7.3 7.6 6.8 9.4 9.3  HGB 11.8* 11.4* 10.8* 11.4* 12.1*  HCT 35.5* 34.8* 33.3* 34.7* 37.5*  MCV 84.5 84.9 85.2 84.6 86.2  PLT 365 332 324 332 818   Basic Metabolic Panel:  Recent Labs Lab 12/01/15 1621 12/02/15 0409 12/03/15 0343 12/04/15 0422 12/05/15 0449  NA 131* 136 135 138 140  K 3.7 4.4 4.0 3.9 3.6  CL 95* 101 103 105 104  CO2 25 27 25 24 27   GLUCOSE 469* 202* 334* 160* 92  BUN 10 7 9 9 10   CREATININE 0.60* 0.46* 0.64 0.56* 0.54*  CALCIUM 9.3 9.5 8.8* 9.1 9.4   GFR: Estimated Creatinine Clearance: 111.1 mL/min (by C-G formula based on SCr of 0.8 mg/dL). Liver Function Tests:  Recent Labs Lab 12/01/15 1621 12/02/15 0409 12/03/15 0343 12/04/15 0422 12/05/15 0449  AST 123* 107* 86* 86* 52*  ALT 164* 154* 119* 132* 105*  ALKPHOS 740* 704* 600* 572* 542*  BILITOT 14.3* 13.3* 12.2* 8.3* 6.3*  PROT 7.7 7.3 6.1* 6.2* 6.7  ALBUMIN 3.3* 3.1* 2.6* 2.8* 3.1*    Recent Labs Lab 12/01/15 1621  LIPASE 13   No results for input(s): AMMONIA in the last 168 hours. Coagulation Profile:  Recent Labs Lab 12/02/15 0409  INR 1.18   Cardiac Enzymes: No results for input(s): CKTOTAL,  CKMB, CKMBINDEX, TROPONINI in the last 168 hours. BNP (last 3 results) No results for input(s): PROBNP in the last 8760 hours. HbA1C: No results for input(s): HGBA1C in the last 72 hours. CBG:  Recent Labs Lab 12/05/15 0759 12/05/15 1234 12/05/15 1658 12/05/15 2234 12/06/15 0752  GLUCAP 91 106* 121* 90 79   Lipid Profile: No results for input(s): CHOL, HDL, LDLCALC, TRIG, CHOLHDL, LDLDIRECT in the last 72 hours. Thyroid Function Tests: No results for input(s): TSH, T4TOTAL, FREET4, T3FREE, THYROIDAB in the last 72 hours. Anemia Panel: No results for input(s): VITAMINB12, FOLATE, FERRITIN, TIBC, IRON, RETICCTPCT in the last 72 hours. Sepsis Labs: No results for input(s): PROCALCITON, LATICACIDVEN in the last 168 hours.  Recent Results (from the past 240 hour(s))  Aerobic/Anaerobic Culture (surgical/deep wound)     Status: None (Preliminary result)  Collection Time: 12/03/15  4:39 PM  Result Value Ref Range Status   Specimen Description BILE  Final   Special Requests Normal  Final   Gram Stain   Final    FEW WBC PRESENT, PREDOMINANTLY MONONUCLEAR NO ORGANISMS SEEN Performed at [facility]    Culture   Final    FEW CANDIDA GLABRATA CRITICAL RESULT CALLED TO, READ BACK BY AND VERIFIED WITH: E BETHEL,RN AT 1225 12/04/15 BY L BENFIELD NO ANAEROBES ISOLATED; CULTURE IN PROGRESS FOR 5 DAYS    Report Status PENDING  Incomplete         Radiology Studies: Dg Ugi  W/kub  Result Date: 12/05/2015 CLINICAL DATA:  Pancreas cancer.  Evaluate for obstruction. EXAM: UPPER GI SERIES WITH KUB TECHNIQUE: After obtaining a scout radiograph a routine upper GI series was performed using thin barium FLUOROSCOPY TIME:  Radiation Exposure Index (as provided by the fluoroscopic device): 107.4 mGy COMPARISON:  None. FINDINGS: On the scout radiograph there is a percutaneous transhepatic biliary drainage catheter overlying the right upper quadrant of the abdomen. Enteric contrast material  from recent CT of the abdomen and pelvis is identified throughout the colon up to the level of the rectum. The patient ingested approximately 8 ounces of thin barium. When the patient was placed in the right lateral orientation there was prompt emptying of the stomach into the duodenum. There is a focal area of marked circumferential luminal narrowing involving the proximal portion of the descending duodenum. Here, the lumen of the duodenum is narrowed to approximately 4 mm. IMPRESSION: 1. Focal area of marked luminal narrowing involving the proximal portion of the descending duodenum secondary to pancreas mass. Here, the lumen of the duodenum measures approximately 4 mm. Electronically Signed   By: Kerby Moors M.D.   On: 12/05/2015 12:04        Scheduled Meds: . collagenase   Topical Daily  . doxycycline  100 mg Oral Q12H  . fluconazole (DIFLUCAN) IV  100 mg Intravenous Q24H  . folic acid  1 mg Oral Daily  . furosemide  20 mg Oral Daily  . insulin aspart  0-9 Units Subcutaneous TID WC  . lipase/protease/amylase  36,000 Units Oral TID AC  . metoprolol succinate  25 mg Oral Daily  . multivitamin with minerals  1 tablet Oral Daily  . nystatin  5 mL Oral QID  . ondansetron  4 mg Oral TID  . pregabalin  200 mg Oral BID  . tamsulosin  0.4 mg Oral Daily   Continuous Infusions:     LOS: 5 days    Time spent: 35 minutes.     Elmarie Shiley, MD [facility] Pager [phone]  If 7PM-7AM, please contact night-coverage [email or web address] Password [facility] 12/06/2015, 8:39 AM

## 2015-12-06 NOTE — Anesthesia Preprocedure Evaluation (Addendum)
Anesthesia Evaluation  Patient identified by MRN, date of birth, ID band  Reviewed: Allergy & Precautions, H&P , NPO status , Patient's Chart, lab work & pertinent test results, reviewed documented beta blocker date and time   Airway Mallampati: II  TM Distance: >3 FB Neck ROM: Full    Dental no notable dental hx. (+) Teeth Intact, Dental Advisory Given   Pulmonary neg pulmonary ROS, former smoker,    Pulmonary exam normal breath sounds clear to auscultation       Cardiovascular + CAD, + Peripheral Vascular Disease and +CHF   Rhythm:Regular Rate:Normal  - Left ventricle: The cavity size was normal. Systolic function was   moderately reduced. The estimated ejection fraction was in the   range of 35% to 40%. Moderate diffuse hypokinesis. There is   akinesis of the basalinferoseptal myocardium. There is akinesis   of the basal-mid myocardium. There is akinesis of the   entireinferolateral myocardium. There was an increased relative   contribution of atrial contraction to ventricular filling.   Doppler parameters are consistent with abnormal left ventricular   relaxation (grade 1 diastolic dysfunction). - Aortic valve: Trileaflet; mildly thickened, mildly calcified   leaflets. - Mitral valve: There was mild regurgitation. - Tricuspid valve: There was trivial regurgitation.    Neuro/Psych negative neurological ROS  negative psych ROS   GI/Hepatic negative GI ROS, Neg liver ROS,   Endo/Other  diabetes, Insulin Dependent  Renal/GU negative Renal ROS  negative genitourinary   Musculoskeletal   Abdominal   Peds  Hematology negative hematology ROS (+)   Anesthesia Other Findings   Reproductive/Obstetrics negative OB ROS                             Anesthesia Physical  Anesthesia Plan  ASA: IV  Anesthesia Plan: MAC   Post-op Pain Management:    Induction: Intravenous  Airway Management  Planned: Oral ETT  Additional Equipment:   Intra-op Plan:   Post-operative Plan: Extubation in OR  Informed Consent: I have reviewed the patients History and Physical, chart, labs and discussed the procedure including the risks, benefits and alternatives for the proposed anesthesia with the patient or authorized representative who has indicated his/her understanding and acceptance.   Dental advisory given  Plan Discussed with: CRNA, Anesthesiologist and Surgeon  Anesthesia Plan Comments: (Possibly metastaic pancreatic cancer, ischemic cardiomyopathy.. Patient has been off aspirin and plavix for extended period of time due to inability to tolerate oral medications, has ischemic EF with mod - severely reduced EF, has also been off his metoprolol for last two days so will supplement with IV.Marland Kitchen Patient is at elevated risk for perioperative MACE associated with this procedure so will attempt under MAC,, denies nausea, vomiting and is NPO appropriate.. All risks explained to patient and he agrees to proceed )      Anesthesia Quick Evaluation

## 2015-12-06 NOTE — Interval H&P Note (Signed)
History and Physical Interval Note:  12/06/2015 2:20 PM  Donald Steele  has presented today for surgery, with the diagnosis of Pancreatic cancer  The various methods of treatment have been discussed with the patient and family. After consideration of risks, benefits and other options for treatment, the patient has consented to  Procedure(s): UPPER ENDOSCOPIC ULTRASOUND (EUS) LINEAR (N/A) ESOPHAGOGASTRODUODENOSCOPY (EGD) WITH PROPOFOL (N/A) DUODENAL STENT PLACEMENT (N/A) as a surgical intervention .  The patient's history has been reviewed, patient examined, no change in status, stable for surgery.  I have reviewed the patient's chart and labs.  Questions were answered to the patient's satisfaction.     [name] D

## 2015-12-06 NOTE — Progress Notes (Signed)
Patient ID: Donald Steele, male   DOB: [birth year], 55 y.o.   MRN: [id number]    Referring Physician(s): Hung,P  Supervising Physician: Daryll Brod  Patient Status:  Inpatient  Chief Complaint:  Pancreatic mass with obstructive CBD  Subjective: Pt without new c/o; awaiting EUS/bx/EGD/poss stent placement today   Allergies: Review of patient's allergies indicates no known allergies.  Medications: Prior to Admission medications   Medication Sig Start Date End Date Taking? Authorizing Provider  aspirin 81 MG EC tablet Take 1 tablet (81 mg total) by mouth daily. Swallow whole. 07/15/15  Yes Camelia Eng Tysinger, PA-C  atorvastatin (LIPITOR) 40 MG tablet Take 1 tablet (40 mg total) by mouth daily. 07/14/15  Yes Liliane Shi, PA-C  clopidogrel (PLAVIX) 75 MG tablet Take 1 tablet (75 mg total) by mouth daily. 07/15/15  Yes Camelia Eng Tysinger, PA-C  collagenase (SANTYL) ointment Apply topically daily. Apply to right foot wound daily. Place moist saline gauze on top and wrap foot daily. 06/07/15  Yes Alvia Grove, PA-C  furosemide (LASIX) 20 MG tablet Take 1 tablet (20 mg total) by mouth daily. 07/15/15  Yes Camelia Eng Tysinger, PA-C  Insulin Degludec (TRESIBA FLEXTOUCH) 100 UNIT/ML SOPN Inject 15 Units into the skin at bedtime. Patient taking differently: Inject 10 Units into the skin at bedtime.  11/17/15  Yes Camelia Eng Tysinger, PA-C  losartan (COZAAR) 25 MG tablet Take 1 tablet (25 mg total) by mouth daily. 07/15/15  Yes Camelia Eng Tysinger, PA-C  metoprolol succinate (TOPROL XL) 25 MG 24 hr tablet Take 1 tablet (25 mg total) by mouth daily. 07/15/15  Yes David S Tysinger, PA-C  NEEDLE, DISP, 30 G (BD DISP NEEDLES) 30G X 1/2" MISC 1 each by Does not apply route at bedtime. 11/17/15  Yes Camelia Eng Tysinger, PA-C  nitroGLYCERIN (NITROSTAT) 0.4 MG SL tablet Place 1 tablet (0.4 mg total) under the tongue every 5 (five) minutes as needed for chest pain. 06/17/15  Yes Scott T Kathlen Mody, PA-C  pregabalin (LYRICA) 200  MG capsule Take 1 capsule (200 mg total) by mouth 2 (two) times daily. 11/15/15  Yes Camelia Eng Tysinger, PA-C  traMADol (ULTRAM) 50 MG tablet Take 1 tablet (50 mg total) by mouth every 12 (twelve) hours as needed for moderate pain or severe pain. 11/15/15  Yes Camelia Eng Tysinger, PA-C  dapagliflozin propanediol (FARXIGA) 10 MG TABS tablet Take 10 mg by mouth daily. Patient not taking: Reported on 12/01/2015 11/17/15   Camelia Eng Tysinger, PA-C  metFORMIN (GLUCOPHAGE) 1000 MG tablet Take 1 tablet (1,000 mg total) by mouth 2 (two) times daily with a meal. Patient not taking: Reported on 12/01/2015 07/15/15   Camelia Eng Tysinger, PA-C     Vital Signs: BP 118/67 (BP Location: Left Arm)   Pulse 64   Temp 98.2 F (36.8 C) (Oral)   Resp 18   Ht 5\' 11"  (1.803 m)   Wt 169 lb (76.7 kg)   SpO2 100%   BMI 23.57 kg/m   Physical Exam biliary drain capped; insertion site ok, mildly tender; dressing dry  Imaging: Dg Ugi  W/kub  Result Date: 12/05/2015 CLINICAL DATA:  Pancreas cancer.  Evaluate for obstruction. EXAM: UPPER GI SERIES WITH KUB TECHNIQUE: After obtaining a scout radiograph a routine upper GI series was performed using thin barium FLUOROSCOPY TIME:  Radiation Exposure Index (as provided by the fluoroscopic device): 107.4 mGy COMPARISON:  None. FINDINGS: On the scout radiograph there is a percutaneous transhepatic biliary drainage catheter overlying the  right upper quadrant of the abdomen. Enteric contrast material from recent CT of the abdomen and pelvis is identified throughout the colon up to the level of the rectum. The patient ingested approximately 8 ounces of thin barium. When the patient was placed in the right lateral orientation there was prompt emptying of the stomach into the duodenum. There is a focal area of marked circumferential luminal narrowing involving the proximal portion of the descending duodenum. Here, the lumen of the duodenum is narrowed to approximately 4 mm. IMPRESSION: 1. Focal area of  marked luminal narrowing involving the proximal portion of the descending duodenum secondary to pancreas mass. Here, the lumen of the duodenum measures approximately 4 mm. Electronically Signed   By: Kerby Moors M.D.   On: 12/05/2015 12:04   Dg C-arm 61-120 Min-no Report  Result Date: 12/02/2015 CLINICAL DATA: ercp C-ARM 61-120 MINUTES Fluoroscopy was utilized by the requesting physician.  No radiographic interpretation.   Ir Int Lianne Cure Biliary Drain With Cholangiogram  Result Date: 12/03/2015 INDICATION: Obstructive jaundice, central pancreatic mass, unsuccessfully ERCP stent insertion. EXAM: IR INT-EXT BILIARY DRAIN W/ CHOLANGIOGRAM MEDICATIONS: 3.375 g Zosyn; The antibiotic was administered within an appropriate time frame prior to the initiation of the procedure. ANESTHESIA/SEDATION: Moderate (conscious) sedation was employed during this procedure. A total of Versed 6 mg and Fentanyl 300 mcg was administered intravenously. Moderate Sedation Time: 73 minutes. The patient's level of consciousness and vital signs were monitored continuously by radiology nursing throughout the procedure under my direct supervision. FLUOROSCOPY TIME:  Fluoroscopy Time: 30 minutes 36 seconds (614 mGy). COMPLICATIONS: None immediate. PROCEDURE: Informed written consent was obtained from the patient after a thorough discussion of the procedural risks, benefits and alternatives. All questions were addressed. Maximal Sterile Barrier Technique was utilized including caps, mask, sterile gowns, sterile gloves, sterile drape, hand hygiene and skin antiseptic. A timeout was performed prior to the initiation of the procedure. Under sterile conditions and local anesthesia, initially percutaneous ultrasound access performed of the right hepatic lobe in the mid axillary line with a 21 gauge needle. Several passes were made. Eventually a peripheral right hepatic bile duct was accessed for cholangiogram. Cholangiogram performed. This  demonstrates minor intrahepatic biliary dilatation. Extrahepatic common bile duct is dilated with obstruction distally. Several additional 21 gauge needle passes were made through the liver with ultrasound and fluoroscopy. Eventually, an inferior right hepatic duct was accessed with a 21 gauge needle more centrally. A 0.018 guidewire was eventually advanced into the common bile duct. Four [place] Accustick dilator set was advanced. Outer dilator was advanced to the common bile duct. Contrast injection confirms position. Amplatz guidewire advanced and crossed into the duodenum. Ten [place] tract dilatation performed. Eventually, a 35 [place] internal external biliary drain was able to be advanced over the Amplatz guidewire with the retention loop formed in the duodenum. Peripheral radiopaque marker within the right hepatic duct. Contrast injection confirms adequate drainage of the right and left hepatic ducts. Catheter secured with a Prolene retention suture and connected to external gravity drainage. Sterile dressing applied. No immediate complication. Patient tolerated the procedure well. Bile sample sent for Gram stain and culture. IMPRESSION: Difficult but successful ultrasound and fluoroscopically guided right 10 French internal external biliary drain insertion. Electronically Signed   By: Jerilynn Mages.  Shick M.D.   On: 12/03/2015 17:07    Labs:  CBC:  Recent Labs  12/02/15 0409 12/03/15 0343 12/04/15 0422 12/05/15 0449  WBC 7.6 6.8 9.4 9.3  HGB 11.4* 10.8* 11.4* 12.1*  HCT  34.8* 33.3* 34.7* 37.5*  PLT 332 324 332 368    COAGS:  Recent Labs  06/01/15 2015 06/02/15 0300 06/24/15 1024 06/24/15 1858 12/02/15 0409  INR 1.02 1.11 1.04 1.07 1.18  APTT 32  --   --  29  --     BMP:  Recent Labs  12/02/15 0409 12/03/15 0343 12/04/15 0422 12/05/15 0449  NA 136 135 138 140  K 4.4 4.0 3.9 3.6  CL 101 103 105 104  CO2 27 25 24 27   GLUCOSE 202* 334* 160* 92  BUN 7 9 9 10   CALCIUM 9.5 8.8* 9.1  9.4  CREATININE 0.46* 0.64 0.56* 0.54*  GFRNONAA >60 >60 >60 >60  GFRAA >60 >60 >60 >60    LIVER FUNCTION TESTS:  Recent Labs  12/02/15 0409 12/03/15 0343 12/04/15 0422 12/05/15 0449  BILITOT 13.3* 12.2* 8.3* 6.3*  AST 107* 86* 86* 52*  ALT 154* 119* 132* 105*  ALKPHOS 704* 600* 572* 542*  PROT 7.3 6.1* 6.2* 6.7  ALBUMIN 3.1* 2.6* 2.8* 3.1*    Assessment and Plan: S/p I/E biliary drain placement 8/19 (panc mass, obst CBD); AF; labs today pend; bile cx's - few candida; last t bili 6.3 8/21; for EGD/EUS today per Dr. Benson Norway  Electronically Signed: D. Rowe Robert 12/06/2015, 11:42 AM   I spent a total of 15 minutes at the the patient's bedside AND on the patient's hospital floor or unit, greater than 50% of which was counseling/coordinating care for biliary drain placement

## 2015-12-07 ENCOUNTER — Other Ambulatory Visit: Payer: BLUE CROSS/BLUE SHIELD

## 2015-12-07 ENCOUNTER — Encounter (HOSPITAL_COMMUNITY): Payer: Self-pay | Admitting: Gastroenterology

## 2015-12-07 DIAGNOSIS — I110 Hypertensive heart disease with heart failure: Secondary | ICD-10-CM

## 2015-12-07 DIAGNOSIS — E871 Hypo-osmolality and hyponatremia: Secondary | ICD-10-CM

## 2015-12-07 DIAGNOSIS — I255 Ischemic cardiomyopathy: Secondary | ICD-10-CM

## 2015-12-07 DIAGNOSIS — I739 Peripheral vascular disease, unspecified: Secondary | ICD-10-CM

## 2015-12-07 DIAGNOSIS — E1151 Type 2 diabetes mellitus with diabetic peripheral angiopathy without gangrene: Secondary | ICD-10-CM

## 2015-12-07 DIAGNOSIS — K838 Other specified diseases of biliary tract: Secondary | ICD-10-CM

## 2015-12-07 DIAGNOSIS — K869 Disease of pancreas, unspecified: Secondary | ICD-10-CM

## 2015-12-07 DIAGNOSIS — I5042 Chronic combined systolic (congestive) and diastolic (congestive) heart failure: Secondary | ICD-10-CM

## 2015-12-07 LAB — GLUCOSE, CAPILLARY
GLUCOSE-CAPILLARY: 206 mg/dL — AB (ref 65–99)
GLUCOSE-CAPILLARY: 215 mg/dL — AB (ref 65–99)
Glucose-Capillary: 94 mg/dL (ref 65–99)
Glucose-Capillary: 358 mg/dL — ABNORMAL HIGH (ref 65–99)

## 2015-12-07 MED ORDER — INSULIN ASPART 100 UNIT/ML ~~LOC~~ SOLN
5.0000 [IU] | Freq: Once | SUBCUTANEOUS | Status: AC
  Administered 2015-12-07: 5 [IU] via SUBCUTANEOUS

## 2015-12-07 MED ORDER — LOSARTAN POTASSIUM 50 MG PO TABS
25.0000 mg | ORAL_TABLET | Freq: Every day | ORAL | Status: DC
  Administered 2015-12-07 – 2015-12-09 (×3): 25 mg via ORAL
  Filled 2015-12-07 (×3): qty 1

## 2015-12-07 NOTE — Progress Notes (Signed)
Subjective:  Patient seems to be doing fairly well after having a duodenal stent placed. Tolerating liquids well. Denies having any nausea or vomiting. No abdominal pain. Objective: Vital signs in last 24 hours: Temp:  [97.8 F (36.6 C)-98.3 F (36.8 C)] 98.2 F (36.8 C) (08/23 0428) Pulse Rate:  [58-72] 64 (08/23 1018) Resp:  [11-16] 14 (08/23 0428) BP: (117-207)/(48-88) 146/75 (08/23 1018) SpO2:  [100 %] 100 % (08/23 0428) Last BM Date: 12/06/15  Intake/Output from previous day: 08/22 0701 - 08/23 0700 In: 700 [I.V.:700] Out: 790 [Urine:790] Intake/Output this shift: Total I/O In: -  Out: 500 [Urine:500]  General appearance: alert, cooperative, appears stated age, icteric and no distress Resp: clear to auscultation bilaterally Cardio: regular rate and rhythm, S1, S2 normal, no murmur, click, rub or gallop GI: soft, non-tender; bowel sounds normal; no masses,  no organomegaly Extremities: extremities normal, atraumatic, no cyanosis or edema  Lab Results:  Recent Labs  12/05/15 0449 12/06/15 1201  WBC 9.3 8.5  HGB 12.1* 11.4*  HCT 37.5* 36.5*  PLT 368 390   BMET  Recent Labs  12/05/15 0449 12/06/15 1201  NA 140 138  K 3.6 3.7  CL 104 102  CO2 27 26  GLUCOSE 92 92  BUN 10 16  CREATININE 0.54* 0.66  CALCIUM 9.4 9.4   LFT  Recent Labs  12/06/15 1201  PROT 7.3  ALBUMIN 3.1*  AST 62*  ALT 100*  ALKPHOS 514*  BILITOT 5.5*    Studies/Results: Dg Abd 1 View  Result Date: 12/06/2015 CLINICAL DATA:  Duodenal stent placement EXAM: ABDOMEN - 1 VIEW COMPARISON:  Fluoroscopic series of December 05, 2015 FINDINGS: A duodenal stent is in place in the expected location of the descending duodenum. A percutaneous biliary drainage tube is in place and appears to be in stable position. There is barium within the colon and rectum. No small or large bowel obstructive pattern is observed. IMPRESSION: No evidence of bowel obstruction. No complication observed following  biliary stent placement. Electronically Signed   By: David  Martinique M.D.   On: 12/06/2015 17:22   Medications: I have reviewed the patient's current medications.  Assessment/Plan: Pancreatic head mass s/p EUS with stenting of the duodenum-tolerating a liquid diet well. Will advance as tolerated.   LOS: 6 days   Donald Steele 12/07/2015, 1:08 PM

## 2015-12-07 NOTE — Progress Notes (Deleted)
Patient refusing MRI, per patient he needed to be sedated for this procedure. I texted Dr. Wynelle Cleveland re: this matter.

## 2015-12-07 NOTE — Progress Notes (Signed)
PROGRESS NOTE    Donald Steele  BZJ:[id number] DOB: 17-May-1960 DOA: 12/01/2015 PCP: Crisoforo Oxford, PA-C    Brief Narrative: HPI: Donald Steele is a 55 y.o. male with medical history significant of ischemic cardiomyopathy last EF 35 to 40 % 2-17,s /p R Fem-Tib bypass. , DM, PVD, L subclavian artery stenting.07/07/15 , he relates weight loss since February, more than 60 pounds, diarrhea and decrease appetite. Report abdominal pain, sometimes after meals.  He relates that he has not been taking his plavix for a week now due to nausea. He has not ben taking most of his medications.   presents for evaluation of pancreatic mass, obstructive jaundice. Sodium at 131, glucose 469, Alk phosphatase 740, albumin 3.3, lipase 13, AST 123, ALT 164, bili 14. CT abdomen pelvis done 8-16; 1. Poorly defined hypodense mass at the pancreatic head, measuring approximately 3.1 x 3.9 x 2.9 cm, highly concerning for primary pancreatic neoplasm. Associated mild pancreatic and biliary ductal dilatation as above. Hypodense lesions at the central aspect of the liver, somewhat indeterminate, and may reflect focally dilated biliary ducts. Possible metastases not entirely excluded. Single small 4 mm nodular density within the superior segment of the right lower lobe, indeterminate. Attention at follow-up recommended. Prominent coronary artery and aorto bi-iliac atherosclerotic disease.  Subjective: Tolerating clear liquids  Assessment & Plan:   Principal Problem:   Pancreatic mass Active Problems:   DM (diabetes mellitus), type 2 with peripheral vascular complications (HCC)   PAD (peripheral artery disease) (HCC)   Chronic combined systolic and diastolic CHF (congestive heart failure) (HCC)   Ischemic cardiomyopathy   Hypertensive heart disease   Obstructive jaundice due to malignant neoplasm   Hyponatremia   Obstructive jaundice   Obstructive jaundice, duodenal stricture, pancreatic mass; - has biliary  drain - placed by IR on 8/19 - Duodenal Stent placed on 8/22- diet being advanced - await biopsy results of Duodenal ulcer and pancreatic mass - Holding plavix aspirin for stent placement - Resume Plavix and Aspirin tomorrow if no issues with stent  Left subclavian artery; 06-2015; Discussed with Dr Bridgett Larsson, ok to hold plavix for biopsy.    CAD, Cardiomyopathy  - ACE has been on hold   - continue with metoprolol.  - Resume plavix and aspirin pot procedure when ok by Dr Benson Norway    Pancreatic insufficiency - started  pancreatic enzymes.   Diabetes - poor oral intake hold lantus.   oral candidiasis - nystatin orederd.   left fifth toe ulcer, b/l leg wounds -  MRI ; 5 th toe with osteomyelitis vs bone marrow edema. Discussed with ID continue with doxy, probably bone marrow edema. Follow up outpatient.  Patient was prescribe Doxy, will continue.  Multiple ulcers LE; wound care consulted. - follows with outpt wound care  Urine rentention - if persist might need foley  Started  flomax.    DVT prophylaxis: scd Code Status: full code.  Family Communication: care discussed with patient.  Disposition Plan; await stent placement and will need to see if patient tolerates diet.   Consultants:   Dr Benson Norway    Procedures:   ERCP   Antimicrobials:  Doxycycline    Objective: Vitals:   12/06/15 2123 12/07/15 0428 12/07/15 1018 12/07/15 1502  BP: 132/71 134/75 (!) 146/75 (!) 115/54  Pulse: 65 64 64 65  Resp: 16 14  14   Temp: 98.3 F (36.8 C) 98.2 F (36.8 C)    TempSrc: Oral Oral    SpO2: 100% 100%  100%  Weight:      Height:        Intake/Output Summary (Last 24 hours) at 12/07/15 1543 Last data filed at 12/07/15 1502  Gross per 24 hour  Intake              120 ml  Output             1540 ml  Net            -1420 ml   Filed Weights   12/03/15 0956  Weight: 76.7 kg (169 lb)    Examination:  General exam: Appears calm and comfortable , icteric.  Respiratory  system: Clear to auscultation. Respiratory effort normal. Cardiovascular system: S1 & S2 heard, RRR. No JVD, murmurs, rubs, gallops or clicks. No pedal edema. Gastrointestinal system: Abdomen is nondistended, soft and nontender. No organomegaly or masses felt. Normal bowel sounds heard. drain right side in place with clean dressing  Central nervous system: Alert and oriented. No focal neurological deficits. Extremities: Symmetric 5 x 5 power. Skin: -  wounds on b/l legs and Left fifth toe with ulceration, mild drainage.     Data Reviewed: I have personally reviewed following labs and imaging studies  CBC:  Recent Labs Lab 12/02/15 0409 12/03/15 0343 12/04/15 0422 12/05/15 0449 12/06/15 1201  WBC 7.6 6.8 9.4 9.3 8.5  NEUTROABS  --   --   --   --  4.6  HGB 11.4* 10.8* 11.4* 12.1* 11.4*  HCT 34.8* 33.3* 34.7* 37.5* 36.5*  MCV 84.9 85.2 84.6 86.2 87.7  PLT 332 324 332 368 244   Basic Metabolic Panel:  Recent Labs Lab 12/02/15 0409 12/03/15 0343 12/04/15 0422 12/05/15 0449 12/06/15 1201  NA 136 135 138 140 138  K 4.4 4.0 3.9 3.6 3.7  CL 101 103 105 104 102  CO2 27 25 24 27 26   GLUCOSE 202* 334* 160* 92 92  BUN 7 9 9 10 16   CREATININE 0.46* 0.64 0.56* 0.54* 0.66  CALCIUM 9.5 8.8* 9.1 9.4 9.4   GFR: Estimated Creatinine Clearance: 111.1 mL/min (by C-G formula based on SCr of 0.8 mg/dL). Liver Function Tests:  Recent Labs Lab 12/02/15 0409 12/03/15 0343 12/04/15 0422 12/05/15 0449 12/06/15 1201  AST 107* 86* 86* 52* 62*  ALT 154* 119* 132* 105* 100*  ALKPHOS 704* 600* 572* 542* 514*  BILITOT 13.3* 12.2* 8.3* 6.3* 5.5*  PROT 7.3 6.1* 6.2* 6.7 7.3  ALBUMIN 3.1* 2.6* 2.8* 3.1* 3.1*    Recent Labs Lab 12/01/15 1621  LIPASE 13   No results for input(s): AMMONIA in the last 168 hours. Coagulation Profile:  Recent Labs Lab 12/02/15 0409  INR 1.18   Cardiac Enzymes: No results for input(s): CKTOTAL, CKMB, CKMBINDEX, TROPONINI in the last 168 hours. BNP  (last 3 results) No results for input(s): PROBNP in the last 8760 hours. HbA1C: No results for input(s): HGBA1C in the last 72 hours. CBG:  Recent Labs Lab 12/06/15 1124 12/06/15 1910 12/06/15 2123 12/07/15 0746 12/07/15 1234  GLUCAP 86 93 111* 94 206*   Lipid Profile: No results for input(s): CHOL, HDL, LDLCALC, TRIG, CHOLHDL, LDLDIRECT in the last 72 hours. Thyroid Function Tests: No results for input(s): TSH, T4TOTAL, FREET4, T3FREE, THYROIDAB in the last 72 hours. Anemia Panel: No results for input(s): VITAMINB12, FOLATE, FERRITIN, TIBC, IRON, RETICCTPCT in the last 72 hours. Sepsis Labs: No results for input(s): PROCALCITON, LATICACIDVEN in the last 168 hours.  Recent Results (from the past 240 hour(s))  Aerobic/Anaerobic Culture (  surgical/deep wound)     Status: None (Preliminary result)   Collection Time: 12/03/15  4:39 PM  Result Value Ref Range Status   Specimen Description BILE  Final   Special Requests Normal  Final   Gram Stain   Final    FEW WBC PRESENT, PREDOMINANTLY MONONUCLEAR NO ORGANISMS SEEN Performed at [facility]    Culture   Final    FEW CANDIDA GLABRATA CRITICAL RESULT CALLED TO, READ BACK BY AND VERIFIED WITH: E BETHEL,RN AT 7588 12/04/15 BY L BENFIELD NO ANAEROBES ISOLATED; CULTURE IN PROGRESS FOR 5 DAYS    Report Status PENDING  Incomplete         Radiology Studies: Dg Abd 1 View  Result Date: 12/06/2015 CLINICAL DATA:  Duodenal stent placement EXAM: ABDOMEN - 1 VIEW COMPARISON:  Fluoroscopic series of December 05, 2015 FINDINGS: A duodenal stent is in place in the expected location of the descending duodenum. A percutaneous biliary drainage tube is in place and appears to be in stable position. There is barium within the colon and rectum. No small or large bowel obstructive pattern is observed. IMPRESSION: No evidence of bowel obstruction. No complication observed following biliary stent placement. Electronically Signed   By: David   Martinique M.D.   On: 12/06/2015 17:22        Scheduled Meds: . collagenase   Topical Daily  . doxycycline  100 mg Oral Q12H  . fluconazole (DIFLUCAN) IV  100 mg Intravenous Q24H  . folic acid  1 mg Oral Daily  . furosemide  20 mg Oral Daily  . insulin aspart  0-9 Units Subcutaneous TID WC  . lipase/protease/amylase  36,000 Units Oral TID AC  . metoprolol succinate  25 mg Oral Daily  . multivitamin with minerals  1 tablet Oral Daily  . nystatin  5 mL Oral QID  . ondansetron  4 mg Oral TID  . pregabalin  200 mg Oral BID  . tamsulosin  0.4 mg Oral Daily   Continuous Infusions:     LOS: 6 days    Time spent: 35 minutes.     Debbe Odea, MD [facility] Pager [phone]  If 7PM-7AM, please contact night-coverage [email or web address] Password [facility] 12/07/2015, 3:43 PM

## 2015-12-07 NOTE — Progress Notes (Signed)
Patient ID: Donald Steele, male   DOB: 12-17-1960, 55 y.o.   MRN: [id number]    Referring Physician(s): Hung,P  Supervising Physician: Arne Cleveland  Patient Status:  Inpatient  Chief Complaint: Pancreatic mass with obstructive CBD   Subjective: Patient resting quietly; denies nausea, vomiting or significant abdominal pain.   Allergies: Review of patient's allergies indicates no known allergies.  Medications: Prior to Admission medications   Medication Sig Start Date End Date Taking? Authorizing Provider  aspirin 81 MG EC tablet Take 1 tablet (81 mg total) by mouth daily. Swallow whole. 07/15/15  Yes Camelia Eng Tysinger, PA-C  atorvastatin (LIPITOR) 40 MG tablet Take 1 tablet (40 mg total) by mouth daily. 07/14/15  Yes Liliane Shi, PA-C  clopidogrel (PLAVIX) 75 MG tablet Take 1 tablet (75 mg total) by mouth daily. 07/15/15  Yes Camelia Eng Tysinger, PA-C  collagenase (SANTYL) ointment Apply topically daily. Apply to right foot wound daily. Place moist saline gauze on top and wrap foot daily. 06/07/15  Yes Alvia Grove, PA-C  furosemide (LASIX) 20 MG tablet Take 1 tablet (20 mg total) by mouth daily. 07/15/15  Yes Camelia Eng Tysinger, PA-C  Insulin Degludec (TRESIBA FLEXTOUCH) 100 UNIT/ML SOPN Inject 15 Units into the skin at bedtime. Patient taking differently: Inject 10 Units into the skin at bedtime.  11/17/15  Yes Camelia Eng Tysinger, PA-C  losartan (COZAAR) 25 MG tablet Take 1 tablet (25 mg total) by mouth daily. 07/15/15  Yes Camelia Eng Tysinger, PA-C  metoprolol succinate (TOPROL XL) 25 MG 24 hr tablet Take 1 tablet (25 mg total) by mouth daily. 07/15/15  Yes David S Tysinger, PA-C  NEEDLE, DISP, 30 G (BD DISP NEEDLES) 30G X 1/2" MISC 1 each by Does not apply route at bedtime. 11/17/15  Yes Camelia Eng Tysinger, PA-C  nitroGLYCERIN (NITROSTAT) 0.4 MG SL tablet Place 1 tablet (0.4 mg total) under the tongue every 5 (five) minutes as needed for chest pain. 06/17/15  Yes Scott T Kathlen Mody, PA-C  pregabalin  (LYRICA) 200 MG capsule Take 1 capsule (200 mg total) by mouth 2 (two) times daily. 11/15/15  Yes Camelia Eng Tysinger, PA-C  traMADol (ULTRAM) 50 MG tablet Take 1 tablet (50 mg total) by mouth every 12 (twelve) hours as needed for moderate pain or severe pain. 11/15/15  Yes Camelia Eng Tysinger, PA-C  dapagliflozin propanediol (FARXIGA) 10 MG TABS tablet Take 10 mg by mouth daily. Patient not taking: Reported on 12/01/2015 11/17/15   Camelia Eng Tysinger, PA-C  metFORMIN (GLUCOPHAGE) 1000 MG tablet Take 1 tablet (1,000 mg total) by mouth 2 (two) times daily with a meal. Patient not taking: Reported on 12/01/2015 07/15/15   Camelia Eng Tysinger, PA-C     Vital Signs: BP (!) 146/75 (BP Location: Right Arm)   Pulse 64   Temp 98.2 F (36.8 C) (Oral)   Resp 14   Ht 5\' 11"  (1.803 m)   Wt 169 lb (76.7 kg)   SpO2 100%   BMI 23.57 kg/m   Physical Exam right biliary drain intact and capped. Insertion site okay, minimal tenderness. Drain flushed with 10 mL sterile normal saline without difficulty or reflux  Imaging: Dg Abd 1 View  Result Date: 12/06/2015 CLINICAL DATA:  Duodenal stent placement EXAM: ABDOMEN - 1 VIEW COMPARISON:  Fluoroscopic series of December 05, 2015 FINDINGS: A duodenal stent is in place in the expected location of the descending duodenum. A percutaneous biliary drainage tube is in place and appears to be in stable  position. There is barium within the colon and rectum. No small or large bowel obstructive pattern is observed. IMPRESSION: No evidence of bowel obstruction. No complication observed following biliary stent placement. Electronically Signed   By: David  Martinique M.D.   On: 12/06/2015 17:22   Dg Duanne Limerick  W/kub  Result Date: 12/05/2015 CLINICAL DATA:  Pancreas cancer.  Evaluate for obstruction. EXAM: UPPER GI SERIES WITH KUB TECHNIQUE: After obtaining a scout radiograph a routine upper GI series was performed using thin barium FLUOROSCOPY TIME:  Radiation Exposure Index (as provided by the  fluoroscopic device): 107.4 mGy COMPARISON:  None. FINDINGS: On the scout radiograph there is a percutaneous transhepatic biliary drainage catheter overlying the right upper quadrant of the abdomen. Enteric contrast material from recent CT of the abdomen and pelvis is identified throughout the colon up to the level of the rectum. The patient ingested approximately 8 ounces of thin barium. When the patient was placed in the right lateral orientation there was prompt emptying of the stomach into the duodenum. There is a focal area of marked circumferential luminal narrowing involving the proximal portion of the descending duodenum. Here, the lumen of the duodenum is narrowed to approximately 4 mm. IMPRESSION: 1. Focal area of marked luminal narrowing involving the proximal portion of the descending duodenum secondary to pancreas mass. Here, the lumen of the duodenum measures approximately 4 mm. Electronically Signed   By: Kerby Moors M.D.   On: 12/05/2015 12:04   Ir Int Lianne Cure Biliary Drain With Cholangiogram  Result Date: 12/03/2015 INDICATION: Obstructive jaundice, central pancreatic mass, unsuccessfully ERCP stent insertion. EXAM: IR INT-EXT BILIARY DRAIN W/ CHOLANGIOGRAM MEDICATIONS: 3.375 g Zosyn; The antibiotic was administered within an appropriate time frame prior to the initiation of the procedure. ANESTHESIA/SEDATION: Moderate (conscious) sedation was employed during this procedure. A total of Versed 6 mg and Fentanyl 300 mcg was administered intravenously. Moderate Sedation Time: 73 minutes. The patient's level of consciousness and vital signs were monitored continuously by radiology nursing throughout the procedure under my direct supervision. FLUOROSCOPY TIME:  Fluoroscopy Time: 30 minutes 36 seconds (614 mGy). COMPLICATIONS: None immediate. PROCEDURE: Informed written consent was obtained from the patient after a thorough discussion of the procedural risks, benefits and alternatives. All questions  were addressed. Maximal Sterile Barrier Technique was utilized including caps, mask, sterile gowns, sterile gloves, sterile drape, hand hygiene and skin antiseptic. A timeout was performed prior to the initiation of the procedure. Under sterile conditions and local anesthesia, initially percutaneous ultrasound access performed of the right hepatic lobe in the mid axillary line with a 21 gauge needle. Several passes were made. Eventually a peripheral right hepatic bile duct was accessed for cholangiogram. Cholangiogram performed. This demonstrates minor intrahepatic biliary dilatation. Extrahepatic common bile duct is dilated with obstruction distally. Several additional 21 gauge needle passes were made through the liver with ultrasound and fluoroscopy. Eventually, an inferior right hepatic duct was accessed with a 21 gauge needle more centrally. A 0.018 guidewire was eventually advanced into the common bile duct. Four [place] Accustick dilator set was advanced. Outer dilator was advanced to the common bile duct. Contrast injection confirms position. Amplatz guidewire advanced and crossed into the duodenum. Ten [place] tract dilatation performed. Eventually, a 53 [place] internal external biliary drain was able to be advanced over the Amplatz guidewire with the retention loop formed in the duodenum. Peripheral radiopaque marker within the right hepatic duct. Contrast injection confirms adequate drainage of the right and left hepatic ducts. Catheter secured with a  Prolene retention suture and connected to external gravity drainage. Sterile dressing applied. No immediate complication. Patient tolerated the procedure well. Bile sample sent for Gram stain and culture. IMPRESSION: Difficult but successful ultrasound and fluoroscopically guided right 10 French internal external biliary drain insertion. Electronically Signed   By: Jerilynn Mages.  Shick M.D.   On: 12/03/2015 17:07    Labs:  CBC:  Recent Labs  12/03/15 0343  12/04/15 0422 12/05/15 0449 12/06/15 1201  WBC 6.8 9.4 9.3 8.5  HGB 10.8* 11.4* 12.1* 11.4*  HCT 33.3* 34.7* 37.5* 36.5*  PLT 324 332 368 390    COAGS:  Recent Labs  06/01/15 2015 06/02/15 0300 06/24/15 1024 06/24/15 1858 12/02/15 0409  INR 1.02 1.11 1.04 1.07 1.18  APTT 32  --   --  29  --     BMP:  Recent Labs  12/03/15 0343 12/04/15 0422 12/05/15 0449 12/06/15 1201  NA 135 138 140 138  K 4.0 3.9 3.6 3.7  CL 103 105 104 102  CO2 25 24 27 26   GLUCOSE 334* 160* 92 92  BUN 9 9 10 16   CALCIUM 8.8* 9.1 9.4 9.4  CREATININE 0.64 0.56* 0.54* 0.66  GFRNONAA >60 >60 >60 >60  GFRAA >60 >60 >60 >60    LIVER FUNCTION TESTS:  Recent Labs  12/03/15 0343 12/04/15 0422 12/05/15 0449 12/06/15 1201  BILITOT 12.2* 8.3* 6.3* 5.5*  AST 86* 86* 52* 62*  ALT 119* 132* 105* 100*  ALKPHOS 600* 572* 542* 514*  PROT 6.1* 6.2* 6.7 7.3  ALBUMIN 2.6* 2.8* 3.1* 3.1*    Assessment and Plan: Patient with history of pancreatic head mass, biliary obstruction, status post internal/external biliary drain on 8/19. Biliary drain capped 8/21. Status post duodenal bulb stent, duodenal ulcer biopsy and pancreatic head mass FNA via EUS on 8/22; pathology pending. Monitor labs. T bili 5.5  yesterday; Await pathology, further plans as per GI.    Electronically Signed: D. Rowe Robert 12/07/2015, 2:05 PM   I spent a total of 15 minutes at the the patient's bedside AND on the patient's hospital floor or unit, greater than 50% of which was counseling/coordinating care for biliary drain

## 2015-12-08 DIAGNOSIS — K315 Obstruction of duodenum: Secondary | ICD-10-CM

## 2015-12-08 LAB — CBC WITH DIFFERENTIAL/PLATELET
BASOS ABS: 0 10*3/uL (ref 0.0–0.1)
Basophils Relative: 1 %
Eosinophils Absolute: 0.4 10*3/uL (ref 0.0–0.7)
Eosinophils Relative: 5 %
HEMATOCRIT: 33.3 % — AB (ref 39.0–52.0)
Hemoglobin: 10.8 g/dL — ABNORMAL LOW (ref 13.0–17.0)
LYMPHS ABS: 2.6 10*3/uL (ref 0.7–4.0)
LYMPHS PCT: 34 %
MCH: 28.3 pg (ref 26.0–34.0)
MCHC: 32.4 g/dL (ref 30.0–36.0)
MCV: 87.4 fL (ref 78.0–100.0)
MONO ABS: 1 10*3/uL (ref 0.1–1.0)
Monocytes Relative: 13 %
NEUTROS ABS: 3.6 10*3/uL (ref 1.7–7.7)
Neutrophils Relative %: 47 %
Platelets: 380 10*3/uL (ref 150–400)
RBC: 3.81 MIL/uL — AB (ref 4.22–5.81)
RDW: 14.4 % (ref 11.5–15.5)
WBC: 7.6 10*3/uL (ref 4.0–10.5)

## 2015-12-08 LAB — GLUCOSE, CAPILLARY
GLUCOSE-CAPILLARY: 361 mg/dL — AB (ref 65–99)
GLUCOSE-CAPILLARY: 375 mg/dL — AB (ref 65–99)
Glucose-Capillary: 276 mg/dL — ABNORMAL HIGH (ref 65–99)
Glucose-Capillary: 274 mg/dL — ABNORMAL HIGH (ref 65–99)

## 2015-12-08 LAB — COMPREHENSIVE METABOLIC PANEL
ALK PHOS: 405 U/L — AB (ref 38–126)
ALT: 84 U/L — AB (ref 17–63)
ALT: 89 U/L — AB (ref 17–63)
AST: 47 U/L — AB (ref 15–41)
AST: 50 U/L — AB (ref 15–41)
Albumin: 2.8 g/dL — ABNORMAL LOW (ref 3.5–5.0)
Albumin: 3.2 g/dL — ABNORMAL LOW (ref 3.5–5.0)
Alkaline Phosphatase: 370 U/L — ABNORMAL HIGH (ref 38–126)
Anion gap: 7 (ref 5–15)
Anion gap: 7 (ref 5–15)
BILIRUBIN TOTAL: 4 mg/dL — AB (ref 0.3–1.2)
BILIRUBIN TOTAL: 4.1 mg/dL — AB (ref 0.3–1.2)
BUN: 15 mg/dL (ref 6–20)
BUN: 13 mg/dL (ref 6–20)
CALCIUM: 8.7 mg/dL — AB (ref 8.9–10.3)
CALCIUM: 8.6 mg/dL — AB (ref 8.9–10.3)
CO2: 29 mmol/L (ref 22–32)
CO2: 27 mmol/L (ref 22–32)
CREATININE: 0.8 mg/dL (ref 0.61–1.24)
CREATININE: 0.79 mg/dL (ref 0.61–1.24)
Chloride: 103 mmol/L (ref 101–111)
Chloride: 101 mmol/L (ref 101–111)
GFR calc Af Amer: >60 mL/min (ref >60)
Glucose, Bld: 274 mg/dL — ABNORMAL HIGH (ref 65–99)
Glucose, Bld: 317 mg/dL — ABNORMAL HIGH (ref 65–99)
Potassium: 3.2 mmol/L — ABNORMAL LOW (ref 3.5–5.1)
Potassium: 4 mmol/L (ref 3.5–5.1)
Sodium: 139 mmol/L (ref 135–145)
Sodium: 135 mmol/L (ref 135–145)
TOTAL PROTEIN: 6.3 g/dL — AB (ref 6.5–8.1)
TOTAL PROTEIN: 6.8 g/dL (ref 6.5–8.1)

## 2015-12-08 LAB — AEROBIC/ANAEROBIC CULTURE (SURGICAL/DEEP WOUND): SPECIAL REQUESTS: NORMAL

## 2015-12-08 LAB — AEROBIC/ANAEROBIC CULTURE W GRAM STAIN (SURGICAL/DEEP WOUND)

## 2015-12-08 MED ORDER — CLOPIDOGREL BISULFATE 75 MG PO TABS
75.0000 mg | ORAL_TABLET | Freq: Every day | ORAL | Status: DC
  Administered 2015-12-08 – 2015-12-09 (×2): 75 mg via ORAL
  Filled 2015-12-08 (×2): qty 1

## 2015-12-08 MED ORDER — POTASSIUM CHLORIDE CRYS ER 20 MEQ PO TBCR
40.0000 meq | EXTENDED_RELEASE_TABLET | Freq: Once | ORAL | Status: AC
  Administered 2015-12-08: 40 meq via ORAL
  Filled 2015-12-08: qty 2

## 2015-12-08 MED ORDER — INSULIN GLARGINE 100 UNIT/ML ~~LOC~~ SOLN
10.0000 [IU] | Freq: Every day | SUBCUTANEOUS | Status: DC
  Administered 2015-12-09: 10 [IU] via SUBCUTANEOUS
  Filled 2015-12-08 (×2): qty 0.1

## 2015-12-08 MED ORDER — ENSURE ENLIVE PO LIQD: 237.0000 mL | Freq: Two times a day (BID) | ORAL | Status: DC

## 2015-12-08 MED ORDER — ENOXAPARIN SODIUM 40 MG/0.4ML ~~LOC~~ SOLN
40.0000 mg | SUBCUTANEOUS | Status: DC
Start: 2015-12-08 — End: 2015-12-09
  Administered 2015-12-08 – 2015-12-09 (×2): 40 mg via SUBCUTANEOUS
  Filled 2015-12-08 (×2): qty 0.4

## 2015-12-08 MED ORDER — ASPIRIN EC 81 MG PO TBEC
81.0000 mg | DELAYED_RELEASE_TABLET | Freq: Every day | ORAL | Status: DC
  Administered 2015-12-08 – 2015-12-09 (×2): 81 mg via ORAL
  Filled 2015-12-08 (×2): qty 1

## 2015-12-08 NOTE — Progress Notes (Signed)
PROGRESS NOTE    Donald Steele  [id number] DOB: 08-14-1960 DOA: 12/01/2015 PCP: Crisoforo Oxford, PA-C    Brief Narrative: HPI: Donald Steele is a 55 y.o. male with medical history significant of ischemic cardiomyopathy last EF 35 to 40 % 2-17,s /p R Fem-Tib bypass. , DM, PVD, L subclavian artery stenting.07/07/15. He relates weight loss since February of more than 60 pounds, diarrhea and decrease appetite. Report abdominal pain, sometimes after meals.  He relates that he has not been taking his plavix and many of his other medication for about 1 wk now.   He presents for evaluation of pancreatic mass, obstructive jaundice.  Sodium at 131, glucose 469, Alk phosphatase 740, albumin 3.3, lipase 13, AST 123, ALT 164, bili 14.  CT abdomen pelvis done on 8/16; 1 reveals a poorly defined hypodense mass at the pancreatic head, measuring approximately 3.1 x 3.9 x 2.9 cm, highly concerning for primary pancreatic neoplasm.   Subjective: Tolerating regular diet today.   Assessment & Plan:   Obstructive jaundice, duodenal stenosis, pancreatic mass; - 8/19 biliary drain placed by IR   -EUS 8/22-  noted to have severe extrinsic stenosis of the duodenal bulb- 2 Duodenal stents placed - diet advanced to solids - EUS further reveals 36 x 31 mm duodenal head mass with suggestion of portal vein invasion and a duodenal ulcer suspected to be malignant  - await biopsy results of Duodenal ulcer and pancreatic mass- I have contacted Oncology - Have been holding plavix aspirin for stent placement- Resume Plavix and Aspirin today  Pancreatic insufficiency - c/o oily loose stools prior to admission- agree with pancreatic enzymes  Left subclavian artery; 06-2015; Discussed with Dr Chen>> ok to hold plavix for biopsy- now resumed   CAD, Ischemic Cardiomyopathy/ chronic systolic CHF - EF 02-72% - continue metoprolol, Losartan and lasix.  - Resumed plavix and aspirin     Diabetes Mellitus - as oral  intake is improving and sugars rising, will resume long acting insulin- start Lantus 10 U daily - cont SSI  Oral candidiasis - nystatin and Fluconazole given- resolved  Candida in biliary drain - "few" organisms noted- doubtful this is an infection- will hold off on antibiotics for this  left fifth toe ulcer, b/l leg wounds -  MRI ; 5 th toe with osteomyelitis vs bone marrow edema.  - was started on Doxycycline as outpt by wound care- Dr Tyrell Antonio discussed with ID - continue with doxy as probably bone marrow edema. Follow up as outpatient.  - Multiple ulcers LE-  - follows with outpt wound care  Urine rentention - Started  Flomax with improvement    DVT prophylaxis: SCD  Code Status: full code Family Communication: care discussed with patient.     Consultants:   Dr Benson Norway   IR  Procedures:   EUS  Antimicrobials: Anti-infectives    Start     Dose/Rate Route Frequency Ordered Stop   12/04/15 1600  fluconazole (DIFLUCAN) IVPB 100 mg     100 mg 50 mL/hr over 60 Minutes Intravenous Every 24 hours 12/04/15 1252     12/03/15 1415  piperacillin-tazobactam (ZOSYN) IVPB 3.375 g     3.375 g 12.5 mL/hr over 240 Minutes Intravenous To Radiology 12/03/15 1403 12/03/15 1915   12/01/15 1830  doxycycline (VIBRA-TABS) tablet 100 mg     100 mg Oral Every 12 hours 12/01/15 1753          Objective: Vitals:   12/07/15 2147 12/08/15 0432 12/08/15 1032 12/08/15  1307  BP: 132/66 106/66 131/65 127/79  Pulse: 78 77 75 69  Resp: _0 Temp: 97.7 F (36.5 C) 98.3 F (36.8 C)  97.8 F (36.6 C)  TempSrc: Oral Oral  Oral  SpO2: 100% 100%  100%  Weight:      Height:        Intake/Output Summary (Last 24 hours) at 12/08/15 1314 Last data filed at 12/08/15 1235  Gross per 24 hour  Intake              720 ml  Output             2125 ml  Net            -1405 ml   Filed Weights   12/03/15 0956  Weight: 76.7 kg (169 lb)    Examination:  General exam: Appears calm and  comfortable , icteric.  Respiratory system: Clear to auscultation. Respiratory effort normal. Cardiovascular system: S1 & S2 heard, RRR. No JVD, murmurs, rubs, gallops or clicks. No pedal edema. Gastrointestinal system: Abdomen is nondistended, soft and nontender. No organomegaly or masses felt. Normal bowel sounds heard. Biliary drain in place with clean dressing  Central nervous system: Alert and oriented. No focal neurological deficits. Extremities: Symmetric 5 x 5 power. Skin: -  wounds on b/l legs and Left fifth toe with ulceration, mild drainage.     Data Reviewed: I have personally reviewed following labs and imaging studies  CBC:  Recent Labs Lab 12/03/15 0343 12/04/15 0422 12/05/15 0449 12/06/15 1201 12/08/15 0359  WBC 6.8 9.4 9.3 8.5 7.6  NEUTROABS  --   --   --  4.6 3.6  HGB 10.8* 11.4* 12.1* 11.4* 10.8*  HCT 33.3* 34.7* 37.5* 36.5* 33.3*  MCV 85.2 84.6 86.2 87.7 87.4  PLT 324 332 368 390 161   Basic Metabolic Panel:  Recent Labs Lab 12/03/15 0343 12/04/15 0422 12/05/15 0449 12/06/15 1201 12/08/15 0359  NA 135 138 140 138 139  K 4.0 3.9 3.6 3.7 3.2*  CL 103 105 104 102 103  CO2 _1 GLUCOSE 334* 160* 92 92 274*  BUN _2 CREATININE 0.64 0.56* 0.54* 0.66 0.80  CALCIUM 8.8* 9.1 9.4 9.4 8.7*   GFR: Estimated Creatinine Clearance: 111.1 mL/min (by C-G formula based on SCr of 0.8 mg/dL). Liver Function Tests:  Recent Labs Lab 12/03/15 0343 12/04/15 0422 12/05/15 0449 12/06/15 1201 12/08/15 0359  AST 86* 86* 52* 62* 47*  ALT 119* 132* 105* 100* 84*  ALKPHOS 600* 572* 542* 514* 370*  BILITOT 12.2* 8.3* 6.3* 5.5* 4.0*  PROT 6.1* 6.2* 6.7 7.3 6.3*  ALBUMIN 2.6* 2.8* 3.1* 3.1* 2.8*    Recent Labs Lab 12/01/15 1621  LIPASE 13   No results for input(s): AMMONIA in the last 168 hours. Coagulation Profile:  Recent Labs Lab 12/02/15 0409  INR 1.18   Cardiac Enzymes: No results for input(s): CKTOTAL, CKMB, CKMBINDEX,  TROPONINI in the last 168 hours. BNP (last 3 results) No results for input(s): PROBNP in the last 8760 hours. HbA1C: No results for input(s): HGBA1C in the last 72 hours. CBG:  Recent Labs Lab 12/07/15 1234 12/07/15 1618 12/07/15 2144 12/08/15 0732 12/08/15 1133  GLUCAP 206* 215* 358* 361* 276*   Lipid Profile: No results for input(s): CHOL, HDL, LDLCALC, TRIG, CHOLHDL, LDLDIRECT in the last 72 hours. Thyroid Function Tests: No results for input(s): TSH, T4TOTAL, FREET4, T3FREE, THYROIDAB in the  last 72 hours. Anemia Panel: No results for input(s): VITAMINB12, FOLATE, FERRITIN, TIBC, IRON, RETICCTPCT in the last 72 hours. Sepsis Labs: No results for input(s): PROCALCITON, LATICACIDVEN in the last 168 hours.  Recent Results (from the past 240 hour(s))  Aerobic/Anaerobic Culture (surgical/deep wound)     Status: None   Collection Time: 12/03/15  4:39 PM  Result Value Ref Range Status   Specimen Description BILE  Final   Special Requests Normal  Final   Gram Stain   Final    FEW WBC PRESENT, PREDOMINANTLY MONONUCLEAR NO ORGANISMS SEEN    Culture   Final    FEW CANDIDA GLABRATA CRITICAL RESULT CALLED TO, READ BACK BY AND VERIFIED WITH: E BETHEL,RN AT 1225 12/04/15 BY L BENFIELD NO ANAEROBES ISOLATED Performed at [facility]    Report Status 12/08/2015 FINAL  Final         Radiology Studies: Dg Abd 1 View  Result Date: 12/06/2015 CLINICAL DATA:  Duodenal stent placement EXAM: ABDOMEN - 1 VIEW COMPARISON:  Fluoroscopic series of December 05, 2015 FINDINGS: A duodenal stent is in place in the expected location of the descending duodenum. A percutaneous biliary drainage tube is in place and appears to be in stable position. There is barium within the colon and rectum. No small or large bowel obstructive pattern is observed. IMPRESSION: No evidence of bowel obstruction. No complication observed following biliary stent placement. Electronically Signed   By: David  Martinique  M.D.   On: 12/06/2015 17:22    Scheduled Meds: . aspirin EC  81 mg Oral Daily  . clopidogrel  75 mg Oral Daily  . collagenase   Topical Daily  . doxycycline  100 mg Oral Q12H  . fluconazole (DIFLUCAN) IV  100 mg Intravenous Q24H  . folic acid  1 mg Oral Daily  . furosemide  20 mg Oral Daily  . insulin aspart  0-9 Units Subcutaneous TID WC  . lipase/protease/amylase  36,000 Units Oral TID AC  . losartan  25 mg Oral Daily  . metoprolol succinate  25 mg Oral Daily  . multivitamin with minerals  1 tablet Oral Daily  . nystatin  5 mL Oral QID  . ondansetron  4 mg Oral TID  . potassium chloride  40 mEq Oral Once  . pregabalin  200 mg Oral BID  . tamsulosin  0.4 mg Oral Daily   Continuous Infusions:     LOS: 7 days    Time spent: 35 minutes.     Debbe Odea, MD [facility] Pager [phone]  If 7PM-7AM, please contact night-coverage [email or web address] Password [email or web address] 12/08/2015, 1:14 PM

## 2015-12-08 NOTE — Care Management Note (Signed)
Case Management Note  Patient Details  Name: Donald Steele MRN: [id number] Date of Birth: 1960-09-19  Subjective/Objective:       55 yo admitted with Pancreatic mass             Action/Plan: From home with mother. Chart reviewed and CM following for DC needs.  Expected Discharge Date:   (unknown)               Expected Discharge Plan:  Home/Self Care  In-House Referral:     Discharge planning Services  CM Consult  Post Acute Care Choice:    Choice offered to:     DME Arranged:    DME Agency:     HH Arranged:    HH Agency:     Status of Service:  In process, will continue to follow  If discussed at Long Length of Stay Meetings, dates discussed:    Additional CommentsLynnell Catalan, RN 12/08/2015, 1:39 PM  [phone]

## 2015-12-08 NOTE — Progress Notes (Signed)
Inpatient Diabetes Program Recommendations  AACE/ADA: New Consensus Statement on Inpatient Glycemic Control (2015)  Target Ranges:  Prepandial:   less than 140 mg/dL      Peak postprandial:   less than 180 mg/dL (1-2 hours)      Critically ill patients:  140 - 180 mg/dL   Lab Results  Component Value Date   GLUCAP 375 (H) 12/08/2015   HGBA1C 11.9 (H) 11/15/2015    Review of Glycemic Control  Diabetes history: DM2 Outpatient Diabetes medications: Tresiba 10 units QHS, Farxiga 10 mg QD, metformin 1000 mg bid Current orders for Inpatient glycemic control: Lantus 10 units QHS (to start 8/24), Novolog sensitive tidwc  Inpatient Diabetes Program Recommendations:    Add Lantus 10 units QHS - ordered Increase Novolog to moderate tidwc and hs Add Novolog 3 units tidwc for meal coverage insulin if pt eats > 50% meal.  Will follow. Thank you. Lorenda Peck, RD, LDN, CDE Inpatient Diabetes Coordinator [phone]

## 2015-12-08 NOTE — Progress Notes (Signed)
Subjective: Eating well.  No complaints.  No issues with nausea and vomiting.  Objective: Vital signs in last 24 hours: Temp:  [97.7 F (36.5 C)-98.3 F (36.8 C)] 97.8 F (36.6 C) (08/24 1307) Pulse Rate:  [69-78] 69 (08/24 1307) Resp:  [15-16] 15 (08/24 1307) BP: (106-132)/(65-79) 127/79 (08/24 1307) SpO2:  [100 %] 100 % (08/24 1307) Last BM Date: 12/07/15  Intake/Output from previous day: 08/23 0701 - 08/24 0700 In: 600 [P.O.:600] Out: 1550 [Urine:1550] Intake/Output this shift: Total I/O In: 240 [P.O.:240] Out: 1075 [Urine:1075]  General appearance: alert and no distress GI: soft, non-tender; bowel sounds normal; no masses,  no organomegaly  Lab Results:  Recent Labs  12/06/15 1201 12/08/15 0359  WBC 8.5 7.6  HGB 11.4* 10.8*  HCT 36.5* 33.3*  PLT 390 380   BMET  Recent Labs  12/06/15 1201 12/08/15 0359  NA 138 139  K 3.7 3.2*  CL 102 103  CO2 26 29  GLUCOSE 92 274*  BUN 16 15  CREATININE 0.66 0.80  CALCIUM 9.4 8.7*   LFT  Recent Labs  12/08/15 0359  PROT 6.3*  ALBUMIN 2.8*  AST 47*  ALT 84*  ALKPHOS 370*  BILITOT 4.0*   PT/INR No results for input(s): LABPROT, INR in the last 72 hours. Hepatitis Panel No results for input(s): HEPBSAG, HCVAB, HEPAIGM, HEPBIGM in the last 72 hours. C-Diff No results for input(s): CDIFFTOX in the last 72 hours. Fecal Lactopherrin No results for input(s): FECLLACTOFRN in the last 72 hours.  Studies/Results: Dg Abd 1 View  Result Date: 12/06/2015 CLINICAL DATA:  Duodenal stent placement EXAM: ABDOMEN - 1 VIEW COMPARISON:  Fluoroscopic series of December 05, 2015 FINDINGS: A duodenal stent is in place in the expected location of the descending duodenum. A percutaneous biliary drainage tube is in place and appears to be in stable position. There is barium within the colon and rectum. No small or large bowel obstructive pattern is observed. IMPRESSION: No evidence of bowel obstruction. No complication observed  following biliary stent placement. Electronically Signed   By: David  Martinique M.D.   On: 12/06/2015 17:22    Medications:  Scheduled: . aspirin EC  81 mg Oral Daily  . clopidogrel  75 mg Oral Daily  . collagenase   Topical Daily  . doxycycline  100 mg Oral Q12H  . enoxaparin (LOVENOX) injection  40 mg Subcutaneous Q24H  . fluconazole (DIFLUCAN) IV  100 mg Intravenous Q24H  . folic acid  1 mg Oral Daily  . furosemide  20 mg Oral Daily  . insulin aspart  0-9 Units Subcutaneous TID WC  . insulin glargine  10 Units Subcutaneous QHS  . lipase/protease/amylase  36,000 Units Oral TID AC  . losartan  25 mg Oral Daily  . metoprolol succinate  25 mg Oral Daily  . multivitamin with minerals  1 tablet Oral Daily  . nystatin  5 mL Oral QID  . ondansetron  4 mg Oral TID  . pregabalin  200 mg Oral BID  . tamsulosin  0.4 mg Oral Daily   Continuous:   Assessment/Plan: 1) Pancreatic adenocarcinoma. 2) Duodenal obstruction and biliary obstruction secondary to the cancer.   I received the official reading from Pathology about the FNA and duodenal biopsies.  Both were positive for adenocarcinoma.  The duodenal stent has allowed him to eat solid food and his TB has dropped with the stent.  He will require an internalization with a metallic stent by IR in the near future.  Plan: 1) Oncology consultation. 2) Supportive care. 3) Signing off.  LOS: 7 days   Marena Witts D 12/08/2015, 3:13 PM

## 2015-12-08 NOTE — Progress Notes (Signed)
Patient ID: Donald Steele, male   DOB: 05/20/1960, 55 y.o.   MRN: [id number]    Referring Physician(s): Hung,P  Supervising Physician: Marybelle Killings  Patient Status:  Inpatient  Chief Complaint: Pancreatic mass with obstructive CBD   Subjective: Pt doing fairly well today; denies worsening abd pain,N/V; tol diet ok; urine still dark   Allergies: Review of patient's allergies indicates no known allergies.  Medications: Prior to Admission medications   Medication Sig Start Date End Date Taking? Authorizing Provider  aspirin 81 MG EC tablet Take 1 tablet (81 mg total) by mouth daily. Swallow whole. 07/15/15  Yes Camelia Eng Tysinger, PA-C  atorvastatin (LIPITOR) 40 MG tablet Take 1 tablet (40 mg total) by mouth daily. 07/14/15  Yes Liliane Shi, PA-C  clopidogrel (PLAVIX) 75 MG tablet Take 1 tablet (75 mg total) by mouth daily. 07/15/15  Yes Camelia Eng Tysinger, PA-C  collagenase (SANTYL) ointment Apply topically daily. Apply to right foot wound daily. Place moist saline gauze on top and wrap foot daily. 06/07/15  Yes Alvia Grove, PA-C  furosemide (LASIX) 20 MG tablet Take 1 tablet (20 mg total) by mouth daily. 07/15/15  Yes Camelia Eng Tysinger, PA-C  Insulin Degludec (TRESIBA FLEXTOUCH) 100 UNIT/ML SOPN Inject 15 Units into the skin at bedtime. Patient taking differently: Inject 10 Units into the skin at bedtime.  11/17/15  Yes Camelia Eng Tysinger, PA-C  losartan (COZAAR) 25 MG tablet Take 1 tablet (25 mg total) by mouth daily. 07/15/15  Yes Camelia Eng Tysinger, PA-C  metoprolol succinate (TOPROL XL) 25 MG 24 hr tablet Take 1 tablet (25 mg total) by mouth daily. 07/15/15  Yes David S Tysinger, PA-C  NEEDLE, DISP, 30 G (BD DISP NEEDLES) 30G X 1/2" MISC 1 each by Does not apply route at bedtime. 11/17/15  Yes Camelia Eng Tysinger, PA-C  nitroGLYCERIN (NITROSTAT) 0.4 MG SL tablet Place 1 tablet (0.4 mg total) under the tongue every 5 (five) minutes as needed for chest pain. 06/17/15  Yes Scott T Kathlen Mody, PA-C    pregabalin (LYRICA) 200 MG capsule Take 1 capsule (200 mg total) by mouth 2 (two) times daily. 11/15/15  Yes Camelia Eng Tysinger, PA-C  traMADol (ULTRAM) 50 MG tablet Take 1 tablet (50 mg total) by mouth every 12 (twelve) hours as needed for moderate pain or severe pain. 11/15/15  Yes Camelia Eng Tysinger, PA-C  dapagliflozin propanediol (FARXIGA) 10 MG TABS tablet Take 10 mg by mouth daily. Patient not taking: Reported on 12/01/2015 11/17/15   Camelia Eng Tysinger, PA-C  metFORMIN (GLUCOPHAGE) 1000 MG tablet Take 1 tablet (1,000 mg total) by mouth 2 (two) times daily with a meal. Patient not taking: Reported on 12/01/2015 07/15/15   Camelia Eng Tysinger, PA-C     Vital Signs: BP 131/65   Pulse 75   Temp 98.3 F (36.8 C) (Oral)   Resp 16   Ht 5\' 11"  (1.803 m)   Wt 169 lb (76.7 kg)   SpO2 100%   BMI 23.57 kg/m   Physical Exam awake/alert; rt biliary drain intact/capped, insertion site ok, mildly tender; drain flushed with sterile NS without difficulty  Imaging: Dg Abd 1 View  Result Date: 12/06/2015 CLINICAL DATA:  Duodenal stent placement EXAM: ABDOMEN - 1 VIEW COMPARISON:  Fluoroscopic series of December 05, 2015 FINDINGS: A duodenal stent is in place in the expected location of the descending duodenum. A percutaneous biliary drainage tube is in place and appears to be in stable position. There is barium within  the colon and rectum. No small or large bowel obstructive pattern is observed. IMPRESSION: No evidence of bowel obstruction. No complication observed following biliary stent placement. Electronically Signed   By: David  Martinique M.D.   On: 12/06/2015 17:22   Dg Duanne Limerick  W/kub  Result Date: 12/05/2015 CLINICAL DATA:  Pancreas cancer.  Evaluate for obstruction. EXAM: UPPER GI SERIES WITH KUB TECHNIQUE: After obtaining a scout radiograph a routine upper GI series was performed using thin barium FLUOROSCOPY TIME:  Radiation Exposure Index (as provided by the fluoroscopic device): 107.4 mGy COMPARISON:  None.  FINDINGS: On the scout radiograph there is a percutaneous transhepatic biliary drainage catheter overlying the right upper quadrant of the abdomen. Enteric contrast material from recent CT of the abdomen and pelvis is identified throughout the colon up to the level of the rectum. The patient ingested approximately 8 ounces of thin barium. When the patient was placed in the right lateral orientation there was prompt emptying of the stomach into the duodenum. There is a focal area of marked circumferential luminal narrowing involving the proximal portion of the descending duodenum. Here, the lumen of the duodenum is narrowed to approximately 4 mm. IMPRESSION: 1. Focal area of marked luminal narrowing involving the proximal portion of the descending duodenum secondary to pancreas mass. Here, the lumen of the duodenum measures approximately 4 mm. Electronically Signed   By: Kerby Moors M.D.   On: 12/05/2015 12:04    Labs:  CBC:  Recent Labs  12/04/15 0422 12/05/15 0449 12/06/15 1201 12/08/15 0359  WBC 9.4 9.3 8.5 7.6  HGB 11.4* 12.1* 11.4* 10.8*  HCT 34.7* 37.5* 36.5* 33.3*  PLT 332 368 390 380    COAGS:  Recent Labs  06/01/15 2015 06/02/15 0300 06/24/15 1024 06/24/15 1858 12/02/15 0409  INR 1.02 1.11 1.04 1.07 1.18  APTT 32  --   --  29  --     BMP:  Recent Labs  12/04/15 0422 12/05/15 0449 12/06/15 1201 12/08/15 0359  NA 138 140 138 139  K 3.9 3.6 3.7 3.2*  CL 105 104 102 103  CO2 24 27 26 29   GLUCOSE 160* 92 92 274*  BUN 9 10 16 15   CALCIUM 9.1 9.4 9.4 8.7*  CREATININE 0.56* 0.54* 0.66 0.80  GFRNONAA >60 >60 >60 >60  GFRAA >60 >60 >60 >60    LIVER FUNCTION TESTS:  Recent Labs  12/04/15 0422 12/05/15 0449 12/06/15 1201 12/08/15 0359  BILITOT 8.3* 6.3* 5.5* 4.0*  AST 86* 52* 62* 47*  ALT 132* 105* 100* 84*  ALKPHOS 572* 542* 514* 370*  PROT 6.2* 6.7 7.3 6.3*  ALBUMIN 2.8* 3.1* 3.1* 2.8*    Assessment and Plan: Patient with history of pancreatic  head mass, biliary obstruction, status post internal/external biliary drain on 8/19. Biliary drain capped 8/21. Status post duodenal bulb stent, duodenal ulcer biopsy and pancreatic head mass FNA via EUS on 8/22; pathology pending.AF; WBC 7.6, hgb 10.8, K 3.2- replace; creat nl; t bili 4.0(5.5), other LFT's lower; Await pathology, further plans as per GI/IM.    Electronically Signed: D. Rowe Robert 12/08/2015, 11:07 AM   I spent a total of 15 minutes at the the patient's bedside AND on the patient's hospital floor or unit, greater than 50% of which was counseling/coordinating care for biliary drain

## 2015-12-09 ENCOUNTER — Other Ambulatory Visit: Payer: BLUE CROSS/BLUE SHIELD

## 2015-12-09 ENCOUNTER — Telehealth: Payer: Self-pay | Admitting: *Deleted

## 2015-12-09 DIAGNOSIS — K8689 Other specified diseases of pancreas: Secondary | ICD-10-CM

## 2015-12-09 DIAGNOSIS — K315 Obstruction of duodenum: Secondary | ICD-10-CM

## 2015-12-09 DIAGNOSIS — E1140 Type 2 diabetes mellitus with diabetic neuropathy, unspecified: Secondary | ICD-10-CM

## 2015-12-09 LAB — GLUCOSE, CAPILLARY
GLUCOSE-CAPILLARY: 307 mg/dL — AB (ref 65–99)
Glucose-Capillary: 230 mg/dL — ABNORMAL HIGH (ref 65–99)

## 2015-12-09 MED ORDER — INSULIN GLARGINE 100 UNIT/ML ~~LOC~~ SOLN: 14.0000 [IU] | Freq: Every day | SUBCUTANEOUS | 11 refills | Status: DC

## 2015-12-09 NOTE — Progress Notes (Signed)
Nutrition Follow-up  DOCUMENTATION CODES:   Severe malnutrition in context of chronic illness  INTERVENTION:   Continue Ensure Enlive po BID, each supplement provides 350 kcal and 20 grams of protein RD to continue to monitor for needs  NUTRITION DIAGNOSIS:   Malnutrition related to chronic illness as evidenced by energy intake < 75% for > or equal to 1 month.  Ongoing.  GOAL:   Patient will meet greater than or equal to 90% of their needs  Progressing.  MONITOR:   Skin, I & O's, Labs, Weight trends, Diet advancement, PO intake  REASON FOR ASSESSMENT:   Malnutrition Screening Tool    ASSESSMENT:   Donald Steele is a 55 y.o. male with medical history significant of ischemic cardiomyopathy last EF 35 to 40 % 2-17,s /p R Fem-Tib bypass. , DM, PVD, L subclavian artery stenting. 07/07/15 , he relates weight loss since February, more than 60 pounds, diarrhea and decrease appetite. Report abdominal pain, sometimes after meals.   Patient is eating 75-100% of meals at this time and tolerating with no N/V. Pt has been ordered ensure supplements. Biopsy results form 8/24 verifies pancreatic cancer per GI note.   Medications: Folic acid tablet daily, Lasix tablet daily, Creon capsule TID, Multivitamin with minerals daily, Zofran tablet TID Labs reviewed: CBGs: [id number] -per DM coordinator note, insulin regimen is being adjusted  Diet Order:  Diet Carb Modified Fluid consistency: Thin; Room service appropriate? Yes  Skin:  Wound (see comment) (Multiple wounds to toe, leg)  Last BM:  8/23  Height:   Ht Readings from Last 1 Encounters:  12/03/15 5\' 11"  (1.803 m)    Weight:   Wt Readings from Last 1 Encounters:  12/03/15 169 lb (76.7 kg)    Ideal Body Weight:  78.18 kg  BMI:  Body mass index is 23.57 kg/m.  Estimated Nutritional Needs:   Kcal:  1900-2200 calories  Protein:  91-114 grams  Fluid:  >/= 1.9L  EDUCATION NEEDS:   No education needs identified at  this time  Clayton Bibles, MS, RD, LDN Pager: [phone] After Hours Pager: [phone]

## 2015-12-09 NOTE — Discharge Summary (Signed)
Physician Discharge Summary  Donald Steele ELM:[id number] DOB: 1960/12/26 DOA: 12/01/2015  PCP: Crisoforo Oxford, PA-C  Admit date: 12/01/2015 Discharge date: 12/09/2015  Admitted From: home  Disposition:  home   Recommendations for Outpatient Follow-up:  1. F/u on LFTs 2. 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   Brief Summary: HPI: Donald Steele a 55 y.o.malewith medical history significant of ischemic cardiomyopathy last EF 35 to 40 % 2-17,s /p R Fem-Tib bypass. , DM, PVD, L subclavian artery stenting.07/07/15. He relates weight loss since February of more than 60 pounds, diarrhea and decrease appetite. Report abdominal pain, sometimes after meals.  He relates that he has not been taking his plavix and many of his other medication for about 1 wk now.   He presents for evaluation of pancreatic mass, obstructive jaundice.  Sodium at 131, glucose 469, Alk phosphatase 740, albumin 3.3, lipase 13, AST 123, ALT 164, bili 14.  CT abdomen pelvis done on 8/16; 1 reveals a poorly defined hypodense mass at the pancreatic head, measuring approximately 3.1 x  3.9 x 2.9 cm, highly concerning for primary pancreatic neoplasm.   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 - c/o oily loose stools prior to admission- improved with pancreatic sphincterotomy and  pancreatic enzymes  Left subclavian artery; 06-2015; Discussed with Dr Chen>> ok to hold plavix for biopsy- subsequently resumed   CAD, Ischemic Cardiomyopathy/ chronic systolic CHF - EF 73-57% - continue metoprolol, Losartan and lasix.  - Resumed plavix and aspirin   Diabetes Mellitus - as oral intake is improving and sugars rising, have resumed long acting insulin  - cont SSI  Oral candidiasis - nystatin and Fluconazole given- resolved  Candida in biliary drain - "few" organisms noted- doubtful this is an infection- will hold off on antibiotics for this  left fifth toe ulcer, b/l leg wounds -  MRI ; 5 th toe with osteomyelitis vs bone marrow edema.  - was started on Doxycycline as outpt by wound care- Dr Tyrell Antonio discussed with ID - continue with doxy as probably bone marrow edema. Follow up as outpatient.  - Multiple ulcers LE-  - follows with outpt wound care  Urine rentention - Started  Flomax with  improvement     Discharge Instructions  Discharge Instructions    Discharge instructions    Complete by:  As directed   Diabetic diet   Increase activity slowly    Complete by:  As directed       Medication List    TAKE these medications   aspirin 81 MG EC tablet Take 1 tablet (81 mg total) by mouth daily. Swallow whole.   atorvastatin 40 MG tablet Commonly known as:  LIPITOR Take 1 tablet (40 mg total) by mouth daily.   clopidogrel 75 MG tablet Commonly known as:  PLAVIX Take 1 tablet (75 mg total) by mouth daily.   collagenase ointment Commonly known as:  SANTYL Apply topically daily. Apply to right foot wound daily. Place moist saline gauze on top and wrap foot daily.   dapagliflozin propanediol 10 MG Tabs tablet Commonly known as:  FARXIGA Take 10 mg by mouth daily.   furosemide 20 MG tablet Commonly known as:  LASIX Take 1 tablet (20 mg total) by mouth daily.   Insulin Degludec 100 UNIT/ML Sopn Commonly known as:  TRESIBA FLEXTOUCH Inject 15 Units into the skin at bedtime. What changed:  how much to take   insulin glargine 100 UNIT/ML injection Commonly known as:  LANTUS Inject 0.14 mLs (14 Units total) into the skin at bedtime.   losartan 25 MG tablet Commonly known as:  COZAAR Take 1 tablet (25 mg total) by mouth daily.   metFORMIN 1000 MG tablet Commonly known as:  GLUCOPHAGE Take 1 tablet (1,000 mg total) by mouth 2 (two) times daily with a meal.   metoprolol succinate 25 MG 24 hr tablet Commonly known as:  TOPROL XL Take 1 tablet (25 mg total) by mouth daily.   NEEDLE (DISP) 30 G 30G X 1/2" Misc Commonly known as:  BD DISP NEEDLES 1 each by Does not apply route at bedtime.   nitroGLYCERIN 0.4 MG SL tablet Commonly known as:  NITROSTAT Place 1 tablet (0.4 mg total) under the tongue every 5 (five) minutes as needed for chest pain.   pregabalin 200 MG capsule Commonly known as:  LYRICA Take 1 capsule (200 mg total) by mouth 2 (two) times  daily.   traMADol 50 MG tablet Commonly known as:  ULTRAM Take 1 tablet (50 mg total) by mouth every 12 (twelve) hours as needed for moderate pain or severe pain.      Follow-up Information    Carlyle Basques, MD Follow up in 2 week(s).   Specialty:  Infectious Diseases Contact information: [address] [place] [address] [phone]        Beryle Beams, MD. Schedule an appointment as soon as possible for a visit in 2 week(s).   Specialty:  Gastroenterology Why:  for GI issues Contact information: [place], [place] [address] [phone]        [name], MD .   Specialty:  Interventional Radiology Contact information: [address] [place] [address] [phone]          No Known Allergies   Procedures/Studies: 8/18 ERCP  8/22 EUS  Dg Abd 1 View  Result Date: 12/06/2015 CLINICAL DATA:  Duodenal stent placement EXAM: ABDOMEN - 1 VIEW COMPARISON:  Fluoroscopic series of December 05, 2015 FINDINGS: A duodenal stent is in place in the expected location of the descending duodenum. A percutaneous biliary drainage tube is in place and appears to be in stable position. There is barium within the colon and rectum. No small or large bowel obstructive pattern is observed. IMPRESSION: No evidence of bowel obstruction. No complication observed following biliary stent placement. Electronically Signed   By: David  Martinique M.D.   On: 12/06/2015 17:22   Ct Chest W Contrast  Result Date: 11/30/2015 CLINICAL DATA:  Initial evaluation for weight loss, elevated liver function tests, jaundice. EXAM: CT CHEST, ABDOMEN, AND PELVIS WITH CONTRAST TECHNIQUE: Multidetector CT imaging of the chest, abdomen and pelvis was performed following the standard protocol during bolus administration of intravenous contrast. CONTRAST:  129m ISOVUE-300 IOPAMIDOL (ISOVUE-300) INJECTION 61% COMPARISON:  None available. FINDINGS: CT CHEST  FINDINGS Few scattered subcentimeter hypodense nodules partially visualize within the thyroid gland, of doubtful clinical significance. Visualized thyroid otherwise unremarkable. No pathologically enlarged mediastinal, hilar, or axillary lymph nodes identified. Intrathoracic aorta of normal caliber without acute abnormality. Scattered calcified plaque within the aortic arch in at the origin of the great vessels. Vascular stent in place at the proximal left subclavian artery. Flow within the stent appears to be patent. No high-grade stenosis at the origin of the great vessels. Heart size within normal limits. Prominent coronary artery calcifications noted, most evident within the LAD. No pericardial effusion. Limited evaluation the pulmonary arteries grossly unremarkable. The lungs are clear without focal infiltrate, pulmonary edema, or pleural effusion. No pneumothorax. Single 4 mm nodular density noted within the superior segment of the right lower lobe (series 4, image 63), indeterminate. Additional 2 mm nodular density along the right major fissure (series 4, image 53), most consistent with a small intra pulmonic lymph node. No other worrisome pulmonary nodule or mass identified. No acute osseous abnormality. No worrisome lytic or blastic osseous lesions. CT ABDOMEN PELVIS FINDINGS Few scattered cystic lucencies noted within the right hepatic lobe near the gallbladder fossa, largest of which measures 1.5 x 1.7 cm (series 2, image 55). These are indeterminate, and may in reflect dilated biliary ducts. Liver otherwise unremarkable with no other focal intrahepatic lesions identified. Gallbladder somewhat hydropic in appearance without acute inflammatory changes. Common bile duct mildly dilated at 8 mm. Mild intrahepatic biliary dilatation at the hilum of the liver. No cholelithiasis or choledocholithiasis identified. Spleen and adrenal glands within normal limits. There is a somewhat ill-defined hypodense mass  centered at the pancreatic head which measures approximately 3.1 x 3.9 x 2.9 cm, highly concerning for primary pancreatic neoplasm. Pancreas at this level is somewhat ill-defined which shaggy margins. There is dilatation of the pancreatic duct distally up to 4 mm. Remainder the pancreatic body and tail are somewhat atrophic in appearance. Mass closely approximates the gastric antrum/pyloric region anteriorly as well as the duodenum laterally. Fat about the celiac axis and SMA is well preserved without frank vascular encasement. No significant peripancreatic adenopathy. Kidneys are equal in size with symmetric enhancement. No nephrolithiasis, hydronephrosis, or focal enhancing renal mass. Stomach within normal limits. No evidence for bowel obstruction. No abnormal wall thickening, mucosal enhancement, or inflammatory fat stranding seen about the bowels. Appendix well visualized in the right lower quadrant and is normal caliber and appearance without associated inflammatory changes suggest acute appendicitis. Moderate amount of retained stool within  the colon. Partially distended bladder within normal limits without acute abnormality. Prostate within normal limits. No free intraperitoneal air. No free fluid. No pathologically enlarged intra-abdominal or pelvic lymph nodes. Postsurgical changes noted within the right groin. Advanced aorto bi-iliac atherosclerotic disease. No aneurysm. No acute osseous abnormality. No worrisome lytic or blastic osseous lesions. IMPRESSION: 1. Poorly defined hypodense mass at the pancreatic head, measuring approximately 3.1 x 3.9 x 2.9 cm, highly concerning for primary pancreatic neoplasm. Associated mild pancreatic and biliary ductal dilatation as above. 2. Hypodense lesions at the central aspect of the liver, somewhat indeterminate, and may reflect focally dilated biliary ducts. Possible metastases not entirely excluded. 3. Single small 4 mm nodular density within the superior segment  of the right lower lobe, indeterminate. Attention at follow-up recommended. 4. Prominent coronary artery and aorto bi-iliac atherosclerotic disease. Electronically Signed   By: Jeannine Boga M.D.   On: 11/30/2015 17:02   Ct Abdomen Pelvis W Contrast  Result Date: 11/30/2015 CLINICAL DATA:  Initial evaluation for weight loss, elevated liver function tests, jaundice. EXAM: CT CHEST, ABDOMEN, AND PELVIS WITH CONTRAST TECHNIQUE: Multidetector CT imaging of the chest, abdomen and pelvis was performed following the standard protocol during bolus administration of intravenous contrast. CONTRAST:  12m ISOVUE-300 IOPAMIDOL (ISOVUE-300) INJECTION 61% COMPARISON:  None available. FINDINGS: CT CHEST FINDINGS Few scattered subcentimeter hypodense nodules partially visualize within the thyroid gland, of doubtful clinical significance. Visualized thyroid otherwise unremarkable. No pathologically enlarged mediastinal, hilar, or axillary lymph nodes identified. Intrathoracic aorta of normal caliber without acute abnormality. Scattered calcified plaque within the aortic arch in at the origin of the great vessels. Vascular stent in place at the proximal left subclavian artery. Flow within the stent appears to be patent. No high-grade stenosis at the origin of the great vessels. Heart size within normal limits. Prominent coronary artery calcifications noted, most evident within the LAD. No pericardial effusion. Limited evaluation the pulmonary arteries grossly unremarkable. The lungs are clear without focal infiltrate, pulmonary edema, or pleural effusion. No pneumothorax. Single 4 mm nodular density noted within the superior segment of the right lower lobe (series 4, image 63), indeterminate. Additional 2 mm nodular density along the right major fissure (series 4, image 53), most consistent with a small intra pulmonic lymph node. No other worrisome pulmonary nodule or mass identified. No acute osseous abnormality. No  worrisome lytic or blastic osseous lesions. CT ABDOMEN PELVIS FINDINGS Few scattered cystic lucencies noted within the right hepatic lobe near the gallbladder fossa, largest of which measures 1.5 x 1.7 cm (series 2, image 55). These are indeterminate, and may in reflect dilated biliary ducts. Liver otherwise unremarkable with no other focal intrahepatic lesions identified. Gallbladder somewhat hydropic in appearance without acute inflammatory changes. Common bile duct mildly dilated at 8 mm. Mild intrahepatic biliary dilatation at the hilum of the liver. No cholelithiasis or choledocholithiasis identified. Spleen and adrenal glands within normal limits. There is a somewhat ill-defined hypodense mass centered at the pancreatic head which measures approximately 3.1 x 3.9 x 2.9 cm, highly concerning for primary pancreatic neoplasm. Pancreas at this level is somewhat ill-defined which shaggy margins. There is dilatation of the pancreatic duct distally up to 4 mm. Remainder the pancreatic body and tail are somewhat atrophic in appearance. Mass closely approximates the gastric antrum/pyloric region anteriorly as well as the duodenum laterally. Fat about the celiac axis and SMA is well preserved without frank vascular encasement. No significant peripancreatic adenopathy. Kidneys are equal in size with symmetric enhancement. No nephrolithiasis,  hydronephrosis, or focal enhancing renal mass. Stomach within normal limits. No evidence for bowel obstruction. No abnormal wall thickening, mucosal enhancement, or inflammatory fat stranding seen about the bowels. Appendix well visualized in the right lower quadrant and is normal caliber and appearance without associated inflammatory changes suggest acute appendicitis. Moderate amount of retained stool within the colon. Partially distended bladder within normal limits without acute abnormality. Prostate within normal limits. No free intraperitoneal air. No free fluid. No  pathologically enlarged intra-abdominal or pelvic lymph nodes. Postsurgical changes noted within the right groin. Advanced aorto bi-iliac atherosclerotic disease. No aneurysm. No acute osseous abnormality. No worrisome lytic or blastic osseous lesions. IMPRESSION: 1. Poorly defined hypodense mass at the pancreatic head, measuring approximately 3.1 x 3.9 x 2.9 cm, highly concerning for primary pancreatic neoplasm. Associated mild pancreatic and biliary ductal dilatation as above. 2. Hypodense lesions at the central aspect of the liver, somewhat indeterminate, and may reflect focally dilated biliary ducts. Possible metastases not entirely excluded. 3. Single small 4 mm nodular density within the superior segment of the right lower lobe, indeterminate. Attention at follow-up recommended. 4. Prominent coronary artery and aorto bi-iliac atherosclerotic disease. Electronically Signed   By: Jeannine Boga M.D.   On: 11/30/2015 17:02   Mr Foot Left Wo Contrast  Result Date: 12/02/2015 CLINICAL DATA:  Pt's wounds on his left leg and left small toe were rewrapped. The area has yellowish green drainage and a foul smell. Pt states he has not changed his bandage since Tuesday when the wound care center changed her bandage . History of diabetes. EXAM: MRI OF THE LEFT FOOT WITHOUT CONTRAST TECHNIQUE: Multiplanar, multisequence MR imaging was performed. No intravenous contrast was administered. COMPARISON:  Left foot x-rays 11/22/2015 FINDINGS: Patient motion degrades image quality limiting evaluation. Bones/Joint/Cartilage Soft tissue ulceration along the dorsal lateral aspect of the fifth phalanx extending down to the cortex. Mild marrow edema within the distal aspect of the fifth proximal phalanx and the base of the fifth middle phalanx without a definitive cortical destruction or periosteal reaction. No other marrow signal abnormality. No fracture or dislocation. Normal alignment. No joint effusion. Ligaments  Collateral ligaments are intact. Muscles and Tendons Visualized flexor, peroneal and extensor compartment tendons are intact. Diffuse T2 hyperintense signal throughout the plantar musculature likely neurogenic. Soft tissue No fluid collection or hematoma. No soft tissue mass. Mild soft tissue edema along the dorsal aspect of the midfoot and forefoot. IMPRESSION: 1. Soft tissue ulceration along the dorsal lateral aspect of the fifth phalanx extending down to the cortex. Mild marrow edema within the distal aspect of the fifth proximal phalanx and the base of the fifth middle phalanx without a definitive cortical destruction or periosteal reaction differential considerations include osteomyelitis versus reactive marrow edema. Electronically Signed   By: Kathreen Devoid   On: 12/02/2015 08:02   Dg Foot Complete Left  Result Date: 11/22/2015 CLINICAL DATA:  Osteomyelitis. EXAM: LEFT FOOT - COMPLETE 3+ VIEW COMPARISON:  08/16/2015. FINDINGS: Erosive changes of the distal phalanx of the left fifth digit cannot be excluded. Osteomyelitis cannot be excluded. Diffuse degenerative change. No radiopaque foreign body . No other focal abnormality IMPRESSION: 1. Erosive changes noted of the distal phalanx of the left fifth digit cannot be excluded. Osteomyelitis cannot be excluded. 2. Diffuse degenerative change.  No other acute abnormality. Electronically Signed   By: Marcello Moores  Register   On: 11/22/2015 13:45   Dg Duanne Limerick  W/kub  Result Date: 12/05/2015 CLINICAL DATA:  Pancreas cancer.  Evaluate  for obstruction. EXAM: UPPER GI SERIES WITH KUB TECHNIQUE: After obtaining a scout radiograph a routine upper GI series was performed using thin barium FLUOROSCOPY TIME:  Radiation Exposure Index (as provided by the fluoroscopic device): 107.4 mGy COMPARISON:  None. FINDINGS: On the scout radiograph there is a percutaneous transhepatic biliary drainage catheter overlying the right upper quadrant of the abdomen. Enteric contrast material  from recent CT of the abdomen and pelvis is identified throughout the colon up to the level of the rectum. The patient ingested approximately 8 ounces of thin barium. When the patient was placed in the right lateral orientation there was prompt emptying of the stomach into the duodenum. There is a focal area of marked circumferential luminal narrowing involving the proximal portion of the descending duodenum. Here, the lumen of the duodenum is narrowed to approximately 4 mm. IMPRESSION: 1. Focal area of marked luminal narrowing involving the proximal portion of the descending duodenum secondary to pancreas mass. Here, the lumen of the duodenum measures approximately 4 mm. Electronically Signed   By: Kerby Moors M.D.   On: 12/05/2015 12:04   Dg C-arm 61-120 Min-no Report  Result Date: 12/02/2015 CLINICAL DATA: ercp C-ARM 61-120 MINUTES Fluoroscopy was utilized by the requesting physician.  No radiographic interpretation.   Ir Int Lianne Cure Biliary Drain With Cholangiogram  Result Date: 12/03/2015 INDICATION: Obstructive jaundice, central pancreatic mass, unsuccessfully ERCP stent insertion. EXAM: IR INT-EXT BILIARY DRAIN W/ CHOLANGIOGRAM MEDICATIONS: 3.375 g Zosyn; The antibiotic was administered within an appropriate time frame prior to the initiation of the procedure. ANESTHESIA/SEDATION: Moderate (conscious) sedation was employed during this procedure. A total of Versed 6 mg and Fentanyl 300 mcg was administered intravenously. Moderate Sedation Time: 73 minutes. The patient's level of consciousness and vital signs were monitored continuously by radiology nursing throughout the procedure under my direct supervision. FLUOROSCOPY TIME:  Fluoroscopy Time: 30 minutes 36 seconds (614 mGy). COMPLICATIONS: None immediate. PROCEDURE: Informed written consent was obtained from the patient after a thorough discussion of the procedural risks, benefits and alternatives. All questions were addressed. Maximal Sterile  Barrier Technique was utilized including caps, mask, sterile gowns, sterile gloves, sterile drape, hand hygiene and skin antiseptic. A timeout was performed prior to the initiation of the procedure. Under sterile conditions and local anesthesia, initially percutaneous ultrasound access performed of the right hepatic lobe in the mid axillary line with a 21 gauge needle. Several passes were made. Eventually a peripheral right hepatic bile duct was accessed for cholangiogram. Cholangiogram performed. This demonstrates minor intrahepatic biliary dilatation. Extrahepatic common bile duct is dilated with obstruction distally. Several additional 21 gauge needle passes were made through the liver with ultrasound and fluoroscopy. Eventually, an inferior right hepatic duct was accessed with a 21 gauge needle more centrally. A 0.018 guidewire was eventually advanced into the common bile duct. Four [place] Accustick dilator set was advanced. Outer dilator was advanced to the common bile duct. Contrast injection confirms position. Amplatz guidewire advanced and crossed into the duodenum. Ten [place] tract dilatation performed. Eventually, a 59 [place] internal external biliary drain was able to be advanced over the Amplatz guidewire with the retention loop formed in the duodenum. Peripheral radiopaque marker within the right hepatic duct. Contrast injection confirms adequate drainage of the right and left hepatic ducts. Catheter secured with a Prolene retention suture and connected to external gravity drainage. Sterile dressing applied. No immediate complication. Patient tolerated the procedure well. Bile sample sent for Gram stain and culture. IMPRESSION: Difficult but successful ultrasound and  fluoroscopically guided right 10 [place] internal external biliary drain insertion. Electronically Signed   By: Jerilynn Mages.  Shick M.D.   On: 12/03/2015 17:07       Discharge Exam: Vitals:   12/09/15 0953 12/09/15 1327  BP: 128/72 140/64   Pulse: 75 72  Resp:  16  Temp:  98.2 F (36.8 C)   Vitals:   12/08/15 2248 12/09/15 0541 12/09/15 0953 12/09/15 1327  BP: 118/73 140/68 128/72 140/64  Pulse: 74 72 75 72  Resp: 16 16  16   Temp: 98 F (36.7 C) 97.7 F (36.5 C)  98.2 F (36.8 C)  TempSrc: Oral Oral  Oral  SpO2: 100% 100%  100%  Weight:      Height:        General: Pt is alert, awake, not in acute distress Cardiovascular: RRR, S1/S2 +, no rubs, no gallops Respiratory: CTA bilaterally, no wheezing, no rhonchi Abdominal: Soft, NT, ND, bowel sounds + Extremities: no edema, no cyanosis    The results of significant diagnostics from this hospitalization (including imaging, microbiology, ancillary and laboratory) are listed below for reference.     Microbiology: Recent Results (from the past 240 hour(s))  Aerobic/Anaerobic Culture (surgical/deep wound)     Status: None   Collection Time: 12/03/15  4:39 PM  Result Value Ref Range Status   Specimen Description BILE  Final   Special Requests Normal  Final   Gram Stain   Final    FEW WBC PRESENT, PREDOMINANTLY MONONUCLEAR NO ORGANISMS SEEN    Culture   Final    FEW CANDIDA GLABRATA CRITICAL RESULT CALLED TO, READ BACK BY AND VERIFIED WITH: E BETHEL,RN AT 1225 12/04/15 BY L BENFIELD NO ANAEROBES ISOLATED Performed at [facility]    Report Status 12/08/2015 FINAL  Final     Labs: BNP (last 3 results) No results for input(s): BNP in the last 8760 hours. Basic Metabolic Panel:  Recent Labs Lab 12/04/15 0422 12/05/15 0449 12/06/15 1201 12/08/15 0359 12/08/15 1450  NA 138 140 138 139 135  K 3.9 3.6 3.7 3.2* 4.0  CL 105 104 102 103 101  CO2 24 27 26 29 27   GLUCOSE 160* 92 92 274* 317*  BUN 9 10 16 15 13   CREATININE 0.56* 0.54* 0.66 0.80 0.79  CALCIUM 9.1 9.4 9.4 8.7* 8.6*   Liver Function Tests:  Recent Labs Lab 12/04/15 0422 12/05/15 0449 12/06/15 1201 12/08/15 0359 12/08/15 1450  AST 86* 52* 62* 47* 50*  ALT 132* 105* 100* 84*  89*  ALKPHOS 572* 542* 514* 370* 405*  BILITOT 8.3* 6.3* 5.5* 4.0* 4.1*  PROT 6.2* 6.7 7.3 6.3* 6.8  ALBUMIN 2.8* 3.1* 3.1* 2.8* 3.2*   No results for input(s): LIPASE, AMYLASE in the last 168 hours. No results for input(s): AMMONIA in the last 168 hours. CBC:  Recent Labs Lab 12/03/15 0343 12/04/15 0422 12/05/15 0449 12/06/15 1201 12/08/15 0359  WBC 6.8 9.4 9.3 8.5 7.6  NEUTROABS  --   --   --  4.6 3.6  HGB 10.8* 11.4* 12.1* 11.4* 10.8*  HCT 33.3* 34.7* 37.5* 36.5* 33.3*  MCV 85.2 84.6 86.2 87.7 87.4  PLT 324 332 368 390 380   Cardiac Enzymes: No results for input(s): CKTOTAL, CKMB, CKMBINDEX, TROPONINI in the last 168 hours. BNP: Invalid input(s): POCBNP CBG:  Recent Labs Lab 12/08/15 1133 12/08/15 1643 12/08/15 2101 12/09/15 0734 12/09/15 1157  GLUCAP 276* 375* 274* 307* 230*   D-Dimer No results for input(s): DDIMER in the  last 72 hours. Hgb A1c No results for input(s): HGBA1C in the last 72 hours. Lipid Profile No results for input(s): CHOL, HDL, LDLCALC, TRIG, CHOLHDL, LDLDIRECT in the last 72 hours. Thyroid function studies No results for input(s): TSH, T4TOTAL, T3FREE, THYROIDAB in the last 72 hours.  Invalid input(s): FREET3 Anemia work up No results for input(s): VITAMINB12, FOLATE, FERRITIN, TIBC, IRON, RETICCTPCT in the last 72 hours. Urinalysis    Component Value Date/Time   COLORURINE ORANGE (A) 12/01/2015 1452   APPEARANCEUR CLEAR 12/01/2015 1452   LABSPEC 1.021 12/01/2015 1452   PHURINE 6.0 12/01/2015 1452   GLUCOSEU >1000 (A) 12/01/2015 1452   HGBUR NEGATIVE 12/01/2015 1452   BILIRUBINUR LARGE (A) 12/01/2015 1452   BILIRUBINUR n 11/15/2015 1249   KETONESUR NEGATIVE 12/01/2015 1452   PROTEINUR NEGATIVE 12/01/2015 1452   UROBILINOGEN 0.2 11/15/2015 1249   NITRITE NEGATIVE 12/01/2015 1452   LEUKOCYTESUR TRACE (A) 12/01/2015 1452   Sepsis Labs Invalid input(s): PROCALCITONIN,  WBC,  LACTICIDVEN Microbiology Recent Results (from the  past 240 hour(s))  Aerobic/Anaerobic Culture (surgical/deep wound)     Status: None   Collection Time: 12/03/15  4:39 PM  Result Value Ref Range Status   Specimen Description BILE  Final   Special Requests Normal  Final   Gram Stain   Final    FEW WBC PRESENT, PREDOMINANTLY MONONUCLEAR NO ORGANISMS SEEN    Culture   Final    FEW CANDIDA GLABRATA CRITICAL RESULT CALLED TO, READ BACK BY AND VERIFIED WITH: E BETHEL,RN AT 1225 12/04/15 BY L BENFIELD NO ANAEROBES ISOLATED Performed at [facility]    Report Status 12/08/2015 FINAL  Final     Time coordinating discharge: Over 30 minutes  SIGNED:   Debbe Odea, MD  [facility] 12/09/2015, 2:20 PM Pager   If 7PM-7AM, please contact night-coverage [email or web address] Password [email or web address]

## 2015-12-09 NOTE — Progress Notes (Signed)
This CM was alerted by RN that pt now wants HHRN. Pt offered choice for [facility] services and chose [facility]. [facility] rep contacted for referral. No other DC needs communicated. Marney Doctor RN,BSN,NCM [phone]

## 2015-12-09 NOTE — Progress Notes (Signed)
This CM met with pt at bedside to inquire whether he would like a HHRN to check on him at home with is bili drain. Pt states he doesn't need one, that if he is taught how, he can do it himself. Pt encouraged to tell his nurse if he changes his mind. Nora Clements RN,BSN,NCM [phone] 

## 2015-12-09 NOTE — Progress Notes (Signed)
Inpatient Diabetes Program Recommendations  AACE/ADA: New Consensus Statement on Inpatient Glycemic Control (2015)  Target Ranges:  Prepandial:   less than 140 mg/dL      Peak postprandial:   less than 180 mg/dL (1-2 hours)      Critically ill patients:  140 - 180 mg/dL   Results for NIEGEL, ZEHNER (MRN [id number]) as of 12/09/2015 13:41  Ref. Range 12/08/2015 07:32 12/08/2015 11:33 12/08/2015 16:43 12/08/2015 21:01  Glucose-Capillary Latest Ref Range: 65 - 99 mg/dL 361 (H) 276 (H) 375 (H) 274 (H)   Results for TREVYN, CALDERON (MRN [id number]) as of 12/09/2015 13:41  Ref. Range 12/09/2015 07:34 12/09/2015 11:57  Glucose-Capillary Latest Ref Range: 65 - 99 mg/dL 307 (H) 230 (H)    Admit with: Pancreatic Mass/ Obstructive Jaundice  History: DM  Home DM Meds: Tyler Aas Insulin 10 units QHS  Current Insulin Orders: Lantus 10 units QHS                                       Novolog Sensitive Correction Scale/ SSI (0-9 units) TID AC      -Note patient saw his PCP (Dr. Glade Lloyd with Family Medicine) on 11/29/15.  At that visit, Metformin and Farxiga oral DM medications were stopped and patient was shown how to use [place] insulin pen.  Started on Tresiba insulin 10 units QHS at that visit with his PCP.  -Fasting glucose and postprandial glucose levels remain elevated.  Eating 100% of meals today.     MD- Please consider the following in-hospital insulin adjustments:  1. Increase Lantus to 15 units QHS (0.2 units/kg dosing)  2. Start Novolog Meal Coverage: Novolog 3 units tid with meals (hold if pt eats <50% of meal)      --Will follow patient during hospitalization--  Wyn Quaker RN, MSN, CDE Diabetes Coordinator Inpatient Glycemic Control Team Team Pager: [phone] (8a-5p)

## 2015-12-09 NOTE — Telephone Encounter (Signed)
Oncology Nurse Navigator Documentation  Oncology Nurse Navigator Flowsheets 12/09/2015  Navigator Location [facility]  Navigator Encounter Type Introductory phone call  Abnormal Finding Date 12/06/2015  Confirmed Diagnosis Date 12/06/2015  Spoke with patient and provided new patient appointment for 12/16/15 at 0/30/0900 in [facility] [place]. Will be seeing Dr. Benay Spice, Dr. Barry Dienes and Dr. Lisbeth Renshaw. Provided him direct contact # for nurse navigator. Informed of location of [place], valet service, and registration process. Reminded to bring photo ID, current insurance cards and a current medication list, including supplements. Patient verbalizes understanding. Notified HIM and medical oncology to schedule. Faxed demographics and med list to [facility].

## 2015-12-12 ENCOUNTER — Other Ambulatory Visit (HOSPITAL_COMMUNITY): Payer: Self-pay | Admitting: Gastroenterology

## 2015-12-12 DIAGNOSIS — C259 Malignant neoplasm of pancreas, unspecified: Secondary | ICD-10-CM

## 2015-12-14 ENCOUNTER — Ambulatory Visit (INDEPENDENT_AMBULATORY_CARE_PROVIDER_SITE_OTHER): Payer: BLUE CROSS/BLUE SHIELD | Admitting: Medical

## 2015-12-14 ENCOUNTER — Encounter: Payer: Self-pay | Admitting: Medical

## 2015-12-14 VITALS — BP 102/60 | HR 88 | Wt 170.0 lb

## 2015-12-14 DIAGNOSIS — K838 Other specified diseases of biliary tract: Secondary | ICD-10-CM

## 2015-12-14 DIAGNOSIS — R748 Abnormal levels of other serum enzymes: Secondary | ICD-10-CM | POA: Insufficient documentation

## 2015-12-14 DIAGNOSIS — E1151 Type 2 diabetes mellitus with diabetic peripheral angiopathy without gangrene: Secondary | ICD-10-CM

## 2015-12-14 DIAGNOSIS — I739 Peripheral vascular disease, unspecified: Secondary | ICD-10-CM | POA: Diagnosis not present

## 2015-12-14 DIAGNOSIS — I110 Hypertensive heart disease with heart failure: Secondary | ICD-10-CM | POA: Diagnosis not present

## 2015-12-14 DIAGNOSIS — L97929 Non-pressure chronic ulcer of unspecified part of left lower leg with unspecified severity: Secondary | ICD-10-CM

## 2015-12-14 DIAGNOSIS — I255 Ischemic cardiomyopathy: Secondary | ICD-10-CM | POA: Diagnosis not present

## 2015-12-14 DIAGNOSIS — K831 Obstruction of bile duct: Secondary | ICD-10-CM

## 2015-12-14 DIAGNOSIS — K315 Obstruction of duodenum: Secondary | ICD-10-CM | POA: Diagnosis not present

## 2015-12-14 DIAGNOSIS — I5042 Chronic combined systolic (congestive) and diastolic (congestive) heart failure: Secondary | ICD-10-CM

## 2015-12-14 DIAGNOSIS — C801 Malignant (primary) neoplasm, unspecified: Secondary | ICD-10-CM | POA: Diagnosis not present

## 2015-12-14 LAB — COMPREHENSIVE METABOLIC PANEL
ALK PHOS: 246 U/L — AB (ref 40–115)
ALT: 39 U/L (ref 9–46)
AST: 21 U/L (ref 10–35)
Albumin: 3.4 g/dL — ABNORMAL LOW (ref 3.6–5.1)
BUN: 11 mg/dL (ref 7–25)
CALCIUM: 9.4 mg/dL (ref 8.6–10.3)
CO2: 26 mmol/L (ref 20–31)
Chloride: 95 mmol/L — ABNORMAL LOW (ref 98–110)
Creat: 0.67 mg/dL — ABNORMAL LOW (ref 0.70–1.33)
GLUCOSE: 397 mg/dL — AB (ref 65–99)
POTASSIUM: 4.6 mmol/L (ref 3.5–5.3)
Sodium: 131 mmol/L — ABNORMAL LOW (ref 135–146)
Total Bilirubin: 3.2 mg/dL — ABNORMAL HIGH (ref 0.2–1.2)
Total Protein: 6.5 g/dL (ref 6.1–8.1)

## 2015-12-14 NOTE — Patient Instructions (Addendum)
Continue these medications:  Continue Aspirin 81mg  daily at bedtime  Continue Atorvastatin cholesterol medication at bedtime  Continue Clopidogrel/Plavix 75mg  daily to prevent heart blockage  Continue Metoprolol 25mg  daily for blood pressure   Continue Losartan 25mg .   You have 2 bottles of this.  You have 50mg  and 25mg  bottles.  For now, finish out the 50mg  bottle, but take 1/2 tablet daily.   When you run out of this, the new bottle is the correct lower dose/1 tablet daily.  Losartan is for blood pressure  Continue Lyrica 200mg  twice daily for pain  Continue Ultram as needed for pain, up to every 6 hours  Continue Tresiba pen insulin, but increase to 15 units nightly.  Once you run out of this, we can change to the Lantus vial  Continue Lasix 20mg  daily for fluid in the morning  Continue Ensure if not hungry or skipping meals  BEGIN Humalog pen/meal time fast acting insulin.  This is 5 units at mealtime.  If you skip a meal, then just take 2.5 units.   This is in ADDITION to the long acting pen at night.    Nitroglycerin is as needed for chest pain     STOP or HOLD OFF on these medications  Lantus insulin vial (because you are still have [place] pen left)  Cipro antibiotic  Farxiga diabetes pill  Metformin for now until we call back tomorrow

## 2015-12-14 NOTE — Progress Notes (Signed)
Subjective: Chief Complaint  Patient presents with  . Follow-up    hospital f/up. not sure about medications.    Here for hospital f/u.  Accompanied by wife.  Admit date 12/01/15, discharged 12/09/15  When I last saw him the week before last we sent him to the ED due to poor oral intake, jaundice, worsening LFTs, CT showing pancreatic mass.   During the hospital stay he had ERCP to help relived blockages form the pancreatic tumor.   He is feeling some better, eating some better, weight is stable for the short term.  His main concern today is confusion over medications at discharge.  He hasn't taken any medications today out of confusion over medications.  He has f/u appt with Dr. [facility] today at 2: 30pm.  He has f/u with interventional radiology at [facility] on Friday to relocate the biliary drain, and has first oncology appt next week 12/22/15.  He states that upon discharge he was not made aware of biopsy results.   He doesn't seem to know what is going on with his liver or pancreas other than its causing jaundice and problems.    Past Medical History:  Diagnosis Date  . Cardiomyopathy ([place])    a. Echo 2/17 - EF 35-40%, mod diff HK, inf-septal, inf-lat AK, Gr 1 DD, mild MR  . Carotid stenosis    a. Carotid US 2/17 - bilat ICA 1-39%  . Chronic combined systolic and diastolic CHF (congestive heart failure) ([place])   . Diabetes mellitus   . History of nuclear stress test    a. Nuc study 2/17 - Myocardial perfusion is abnormal. Findings consistent with prior myocardial infarction (inf, inf-lat, apical inf, apical lateral) with peri-infarct ischemia in apical inferolateral and apical lateral regions. This is a high risk study. Overall left ventricular systolic function was abnormal. LV cavity size is severely enlarged. Nuclear stress EF: 29%.   . Hyperlipidemia   . Peripheral vascular disease due to secondary diabetes mellitus ([place])    a. s/p R Fem-Tibial bypass 2/17   ROS as in  subjective   Objective: BP 102/60   Pulse 88   Wt 170 lb (77.1 kg)   BMI 23.71 kg/m   Gen: less jaundiced appearing today than last visit, nad Otherwise seated without obvious pain or distress like last visit    Assessment Encounter Diagnoses  Name Primary?  . Adenocarcinoma ([place]) Yes  . Liver enzyme elevation   . Obstructive jaundice   . Duodenal stenosis   . Ischemic cardiomyopathy   . PAD (peripheral artery disease) ([place])   . Hypertensive heart disease with heart failure ([place])   . Chronic combined systolic and diastolic CHF (congestive heart failure) ([place])   . DM (diabetes mellitus), type 2 with peripheral vascular complications ([place])   . Leg ulcer, left, with unspecified severity ([place])     Plan: Reviewed discharge summary and recommendations.  We discussed his recent biopsy, and had to give him the unfortunate information that the pancreatic mass showed adenocarcinoma.  We discussed next steps, the serious nature of this findings, discussed having family support, having the appropriate f/u with oncology , IR, GI.   He is requesting short term disability.  will collaborate with his team to help with this request  Sugars are running 200-300.  We began sample of Humalog meal time 5 u with meals insulin flex pen today.   C/t Tresiba but increase to 15u daily QHS.  When he runs out of this he  will change to Lantus vial that was prescribed in the hospital.  He will c/t to monitor sugars.  Will avoid skipping meals.  Will supplement with ensure shakes when no appetite or not eating.  Gave handout on medication clarification today.  Pending labs, we will determine if we need to add back metformin.   Per discharge he should be on doxycycline but instead he has a script for Cipro.    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     Spent 1 hour today in discussion, medication reconciliation, discussion of biopsy results and next steps.   Patient Instructions  Continue  these medications:  Continue Aspirin 81mg  daily at bedtime  Continue Atorvastatin cholesterol medication at bedtime  Continue Clopidogrel/Plavix 75mg  daily to prevent heart blockage  Continue Metoprolol 25mg  daily for blood pressure   Continue Losartan 25mg .   You have 2 bottles of this.  You have 50mg  and 25mg  bottles.  For now, finish out the 50mg  bottle, but take 1/2 tablet daily.   When you run out of this, the new bottle is the correct lower dose/1 tablet daily.  Losartan is for blood pressure  Continue Lyrica 200mg  twice daily for pain  Continue Ultram as needed for pain, up to every 6 hours  Continue Tresiba pen insulin, but increase to 15 units nightly.  Once you run out of this, we can change to the Lantus vial  Continue Lasix 20mg  daily for fluid in the morning  Continue Ensure if not hungry or skipping meals  BEGIN Humalog pen/meal time fast acting insulin.  This is 5 units at mealtime.  If you skip a meal, then just take 2.5 units.   This is in ADDITION to the long acting pen at night.    Nitroglycerin is as needed for chest pain     STOP or HOLD OFF on these medications  Lantus insulin vial (because you are still have [place] pen left)  Cipro antibiotic  Farxiga diabetes pill  Metformin for now until we call back tomorrow  F/u pending labs

## 2015-12-15 ENCOUNTER — Other Ambulatory Visit: Payer: Self-pay | Admitting: Medical

## 2015-12-15 MED ORDER — DOXYCYCLINE HYCLATE 100 MG PO TABS: 100.0000 mg | ORAL_TABLET | Freq: Two times a day (BID) | ORAL | 0 refills | Status: DC

## 2015-12-16 ENCOUNTER — Encounter: Payer: Self-pay | Admitting: *Deleted

## 2015-12-16 ENCOUNTER — Ambulatory Visit
Admit: 2015-12-16 | Discharge: 2015-12-16 | Disposition: A | Discharge status: Home / Self Care | Payer: BLUE CROSS/BLUE SHIELD | Attending: Radiation Oncology | Admitting: Radiation Oncology

## 2015-12-16 ENCOUNTER — Ambulatory Visit: Payer: Self-pay | Admitting: Radiation Oncology

## 2015-12-16 ENCOUNTER — Ambulatory Visit (HOSPITAL_COMMUNITY): Payer: BLUE CROSS/BLUE SHIELD

## 2015-12-16 ENCOUNTER — Ambulatory Visit: Payer: BLUE CROSS/BLUE SHIELD | Admitting: Nutrition

## 2015-12-16 ENCOUNTER — Other Ambulatory Visit: Payer: Self-pay | Admitting: General Surgery

## 2015-12-16 ENCOUNTER — Ambulatory Visit: Payer: BLUE CROSS/BLUE SHIELD | Admitting: Oncology

## 2015-12-16 ENCOUNTER — Encounter: Payer: Self-pay | Admitting: Physical Therapy

## 2015-12-16 ENCOUNTER — Ambulatory Visit: Payer: Self-pay

## 2015-12-16 ENCOUNTER — Ambulatory Visit: Payer: BLUE CROSS/BLUE SHIELD | Attending: Oncology | Admitting: Physical Therapy

## 2015-12-16 ENCOUNTER — Other Ambulatory Visit (HOSPITAL_COMMUNITY): Payer: BLUE CROSS/BLUE SHIELD

## 2015-12-16 ENCOUNTER — Ambulatory Visit (HOSPITAL_BASED_OUTPATIENT_CLINIC_OR_DEPARTMENT_OTHER): Payer: BLUE CROSS/BLUE SHIELD | Admitting: Oncology

## 2015-12-16 DIAGNOSIS — C259 Malignant neoplasm of pancreas, unspecified: Secondary | ICD-10-CM | POA: Insufficient documentation

## 2015-12-16 DIAGNOSIS — C250 Malignant neoplasm of head of pancreas: Secondary | ICD-10-CM | POA: Diagnosis not present

## 2015-12-16 DIAGNOSIS — M6281 Muscle weakness (generalized): Secondary | ICD-10-CM | POA: Diagnosis present

## 2015-12-16 DIAGNOSIS — E119 Type 2 diabetes mellitus without complications: Secondary | ICD-10-CM

## 2015-12-16 DIAGNOSIS — C784 Secondary malignant neoplasm of small intestine: Secondary | ICD-10-CM | POA: Diagnosis not present

## 2015-12-16 DIAGNOSIS — I429 Cardiomyopathy, unspecified: Secondary | ICD-10-CM | POA: Diagnosis not present

## 2015-12-16 DIAGNOSIS — R634 Abnormal weight loss: Secondary | ICD-10-CM

## 2015-12-16 NOTE — Progress Notes (Signed)
Patient was seen in GI clinic.  55 year old male diagnosed with cancer of the pancreas.  He is a patient of Dr. Julieanne Manson and Dr. Lisbeth Renshaw.  Past medical history includes diabetes, PVD, hyperlipidemia, CHF and cardiomyopathy.  Medications include Lipitor, Lasix, Lantus, Lyrica and Humalog.  Labs include glucose 317, and albumin 3.2 on August 24.  Height: 5 feet 11 inches. Weight: 168.2 pounds. Usual body weight: 230 pounds 6 months ago per patient. BMI: 23.46.  Patient diagnosed with severe malnutrition in the context of chronic illness secondary to weight loss and decrease in both muscle mass and fat stores on physical exam.  Nutrition diagnosis:  Inadequate oral intake related to pancreas cancer and uncontrolled diabetes as evidenced by 27% weight loss from usual body weight.  Intervention: Educated patient on the importance of small frequent meals with adequate calories and protein to promote healing and minimize further weight loss. Educated patient on strategies for increased appetite. Brief education provided on following a no concentrated sweets diet for better blood sugar control and healing. Fact sheets were provided. Encouraged patient to consume oral nutrition supplements such as Glucerna or boost glucose control and provided samples. Questions were answered.  Teach back method used.  Contact information given.  Monitoring, evaluation, goals:  Patient will tolerate appropriate amount of calories and protein to promote healing and weight maintenance and improved glycemic control.  Next visit: To be scheduled as needed.  **Disclaimer: This note was dictated with voice recognition software. Similar sounding words can inadvertently be transcribed and this note may contain transcription errors which may not have been corrected upon publication of note.**

## 2015-12-16 NOTE — Progress Notes (Signed)
[facility] New Patient Consult   Referring MD: Carlena Hurl, [place], [place] [address]   Donald Steele 55 y.o.  1961/03/17    Reason for Referral: Pancreas cancer   HPI: Donald Steele developed dark urine, jaundice, vomiting, and abdominal pain. A CT of the abdomen and pelvis 11/30/2015 revealed a mildly dilated common bile duct with mild intrahepatic biliary dilatation and ill-defined mass was noted at the pancreas head with dilatation of the pancreatic duct. The mass was noted to closely approximate the gastric antrum/pylorus and duodenum. No significant adenopathy. The bilirubin and liver enzymes were elevated. Dr. Benson Norway was consulted. An ERCP procedure was performed 12/02/2015. Ulceration and friability of the duodenal bulb was noted. The common bile duct could not be cannulated. Interventional radiology was consulted and he underwent placement of a internal-external biliary drain on 12/03/2015. He was taken to an endoscopic ultrasound procedure on 12/06/2015. An extrinsic severe stenosis was found in the duodenal bulb. The stenosis was stented. A nonbleeding ulcer was found in the first portion of the duodenum. Biopsies were taken. A mass was noted in the pancreas head. There was evidence of portal vein invasion. A fine-needle aspiration of the mass was obtained for cytology. The pathology from the duodenum biopsy revealed adenocarcinoma. The cytology from the pancreas mass biopsy also revealed adenocarcinoma.  He reports improvement in jaundice, nausea, and abdominal pain. He is now tolerating a diet. He is scheduled for consideration of stent internalization next week.  Past Medical History:  Diagnosis Date  . Cardiomyopathy ([place])    a. Echo 2/17 - EF 35-40%, mod diff HK, inf-septal, inf-lat AK, Gr 1 DD, mild MR  . Carotid stenosis    a. Carotid US 2/17 - bilat ICA 1-39%  . Chronic combined systolic and diastolic CHF (congestive heart  failure) ([place])   . Diabetes mellitus 1994   . History of nuclear stress test    a. Nuc study 2/17 - Myocardial perfusion is abnormal. Findings consistent with prior myocardial infarction (inf, inf-lat, apical inf, apical lateral) with peri-infarct ischemia in apical inferolateral and apical lateral regions. This is a high risk study. Overall left ventricular systolic function was abnormal. LV cavity size is severely enlarged. Nuclear stress EF: 29%.   . Hyperlipidemia   . Peripheral vascular disease due to secondary diabetes mellitus ([place])    a. s/p R Fem-Tibial bypass 2/17    .   Leg/foot ulcers-followed at the wound clinic  Past Surgical History:  Procedure Laterality Date  . CARDIAC CATHETERIZATION N/A 06/30/2015   Procedure: Left Heart Cath and Coronary Angiography;  Surgeon: Jettie Booze, MD;  Location: [facility];  Service: Cardiovascular;  Laterality: N/A;  . DUODENAL STENT PLACEMENT N/A 12/06/2015   Procedure: DUODENAL STENT PLACEMENT;  Surgeon: Carol Ada, MD;  [facility];  Service: Endoscopy;  Laterality: N/A;  . ENDARTERECTOMY FEMORAL Right 06/02/2015   Procedure: ENDARTERECTOMY RIGHT PROFUNDA FEMORIS ARTERY;  Surgeon: Mal Misty, MD;  Location: [facility];  Service: Vascular;  Laterality: Right;  . ERCP N/A 12/02/2015   Procedure: ENDOSCOPIC RETROGRADE CHOLANGIOPANCREATOGRAPHY (ERCP);  Surgeon: Carol Ada, MD;  Location: [facility];  Service: Endoscopy;  Laterality: N/A;  . ESOPHAGOGASTRODUODENOSCOPY (EGD) WITH PROPOFOL N/A 12/06/2015   Procedure: ESOPHAGOGASTRODUODENOSCOPY (EGD) WITH PROPOFOL;  Surgeon: Carol Ada, MD;  [facility];  Service: Endoscopy;  Laterality: N/A;  . EUS N/A 12/06/2015   Procedure: UPPER ENDOSCOPIC ULTRASOUND (EUS) LINEAR;  Surgeon: Carol Ada, MD;  [facility];  Service: Endoscopy;  Laterality: N/A;  . FEMORAL-TIBIAL BYPASS GRAFT Right 06/02/2015   Procedure: RIGHT PROFUNDA FEMORIS TO POSTERIOR TIBIAL  ARTERY BYPASS USING RIGHT NON-REVERSED GREATER SAPHENOUS VEIN,  SUBCUTANEOUS TUNNEL;  Surgeon: Mal Misty, MD;  Location: [facility];  Service: Vascular;  Laterality: Right;  . INTRAOPERATIVE ARTERIOGRAM Right 06/02/2015   Procedure: INTRA OPERATIVE ARTERIOGRAM RIGHT LOWER LEG;  Surgeon: Mal Misty, MD;  Location: [facility];  Service: Vascular;  Laterality: Right;  . IR GENERIC HISTORICAL  12/03/2015   IR INT EXT BILIARY DRAIN WITH CHOLANGIOGRAM 12/03/2015 [name], MD [facility]  . No history of Surgery     . PERIPHERAL VASCULAR CATHETERIZATION N/A 06/01/2015   Procedure: Abdominal Aortogram;  Surgeon: Serafina Mitchell, MD;  Location: [facility];  Service: Cardiovascular;  Laterality: N/A;  . PERIPHERAL VASCULAR CATHETERIZATION N/A 07/07/2015   Procedure: Aortic Arch Angiography;  Surgeon: Conrad [place], MD;  Location: [facility];  Service: Cardiovascular;  Laterality: N/A;  . PERIPHERAL VASCULAR CATHETERIZATION N/A 07/07/2015   Procedure: Upper Extremity Angiography;  Surgeon: Conrad Vista, MD;  Location: [facility];  Service: Cardiovascular;  Laterality: N/A;  . PERIPHERAL VASCULAR CATHETERIZATION Left 07/07/2015   Procedure: Peripheral Vascular Intervention;  Surgeon: Conrad , MD;  Location: [facility];  Service: Cardiovascular;  Laterality: Left;  left subclavian and axillary  . VEIN HARVEST Right 06/02/2015   Procedure: VEIN HARVEST RIGHT GREATER SAPHENOUS VEIN;  Surgeon: Mal Misty, MD;  Location: [facility];  Service: Vascular;  Laterality: Right;    Medications: Reviewed  Allergies: No Known Allergies  Family history: His mother had breast cancer. His maternal grandmother had kidney cancer. His maternal great-grandmother had breast cancer. He has one sister. No children.  Social History:   He lives in [place]. He works for a [employer]. He quit smoking cigarettes 8 months ago. No alcohol use. No risk factor for HIV or hepatitis.     ROS:    Positives include: 60 pound weight loss, anorexia, nausea/vomiting prior to duodenal stent placement, constipation  A complete ROS was otherwise negative.  Physical Exam:  Blood pressure (!) 85/50, pulse 97, temperature 98.4 F (36.9 C), temperature source Oral, resp. rate 16, height 5\' 11"  (1.803 m), weight 168 lb 3.2 oz (76.3 kg), SpO2 100 %.  HEENT: Oropharynx without visible mass, neck without mass Lungs: Clear bilaterally Cardiac: Regular rate and rhythm Abdomen: No hepatosplenomegaly, no mass, right upper abdomen biliary drain site without evidence of infection GU: Testes without mass  Lymph nodes: No cervical, supraclavicular, axillary, or inguinal nodes Neurologic: Alert and oriented, the motor exam appears intact in the upper and lower extremities Skin: No rash, 3-4cm ulceration at the left lower pretibial area, ulcer at the left fifth toe, almost completely healed ulcer at the dorsum of the right foot Musculoskeletal: No spine tenderness   LAB:  CBC  Lab Results  Component Value Date   WBC 7.6 12/08/2015   HGB 10.8 (L) 12/08/2015   HCT 33.3 (L) 12/08/2015   MCV 87.4 12/08/2015   PLT 380 12/08/2015   NEUTROABS 3.6 12/08/2015     CMP      Component Value Date/Time   NA 131 (L) 12/14/2015 1158   K 4.6 12/14/2015 1158   CL 95 (L) 12/14/2015 1158   CO2 26 12/14/2015 1158   GLUCOSE 397 (H) 12/14/2015 1158   BUN 11 12/14/2015 1158   CREATININE 0.67 (L)  12/14/2015 1158   CALCIUM 9.4 12/14/2015 1158   PROT 6.5 12/14/2015 1158   ALBUMIN 3.4 (L) 12/14/2015 1158   AST 21 12/14/2015 1158   ALT 39 12/14/2015 1158   ALKPHOS 246 (H) 12/14/2015 1158   BILITOT 3.2 (H) 12/14/2015 1158   GFRNONAA >60 12/08/2015 1450   GFRAA >60 12/08/2015 1450     Imaging:  As per history of present illness, CT images from 11/30/2015 reviewed   Assessment/Plan:   1. Pancreas cancer, pancreas head mass with invasion/obstruction of the duodenum  Clinical stage IIa (T3 N0),  FNA biopsy of the pancreas mass and a duodenal ulcer on 12/06/2015 confirmed adenocarcinoma  EUS on 12/06/2015 revealed invasion of the portal vein 2. Diabetes  3.   Peripheral vascular disease, status post stenting of the left subclavian artery and peripheral bypass surgery  4.   Cardiomyopathy  5.   Leg and foot ulcer secondary to diabetes and peripheral vascular disease-followed at the wound clinic  6.   Obstructive jaundice secondary to #1, status post placement of an external-internal biliary drain 12/03/2015  7.    Anorexia/weight loss   Disposition:   Donald Steele presented with obstructive jaundice. He is noted to have a pancreas head mass with obstruction/invasion of the duodenum and invasion of the portal vein.  His case was presented at the GI tumor conference on 12/14/2015. He does not appear to have a resectable tumor at present. He was seen by myself, Dr. Lisbeth Renshaw, and Dr. Barry Dienes in the multidisciplinary GI oncology clinic today. The recommendation is to proceed with systemic chemotherapy and then a restaging CT evaluation. He may be a candidate for stereotactic radiosurgery or surgery in the future.  He currently has multiple lower extremity ulcers and is followed at the wound clinic. Dr. Barry Dienes will place a Port-A-Cath. He will return for an office visit and further discussion in approximately 2 weeks. We will consider beginning gemcitabine/Abraxane chemotherapy if the leg/foot ulcers continue to heal.  Approximately 50 minutes were spent with the patient today. The majority of the time was used for counseling and coordination of care.  Betsy Coder, MD  12/16/2015, 4:45 PM

## 2015-12-16 NOTE — Therapy (Signed)
[facility] [address] [place], [place], [address] Phone: [phone]   Fax:  [phone]  Physical Therapy Evaluation  Patient Details  Name: Donald Steele MRN: [id number] Date of Birth: 10-Mar-1961 Referring Provider: Dr. Betsy Coder  Encounter Date: 12/16/2015      PT End of Session - 12/16/15 1204    Visit Number 1   PT Start Time 1140   PT Stop Time 1153   PT Time Calculation (min) 13 min   Activity Tolerance Patient tolerated treatment well   Behavior During Therapy [facility] for tasks assessed/performed      Past Medical History:  Diagnosis Date  . Cardiomyopathy ([place])    a. Echo 2/17 - EF 35-40%, mod diff HK, inf-septal, inf-lat AK, Gr 1 DD, mild MR  . Carotid stenosis    a. Carotid US 2/17 - bilat ICA 1-39%  . Chronic combined systolic and diastolic CHF (congestive heart failure) ([place])   . Diabetes mellitus   . History of nuclear stress test    a. Nuc study 2/17 - Myocardial perfusion is abnormal. Findings consistent with prior myocardial infarction (inf, inf-lat, apical inf, apical lateral) with peri-infarct ischemia in apical inferolateral and apical lateral regions. This is a high risk study. Overall left ventricular systolic function was abnormal. LV cavity size is severely enlarged. Nuclear stress EF: 29%.   . Hyperlipidemia   . Peripheral vascular disease due to secondary diabetes mellitus ([place])    a. s/p R Fem-Tibial bypass 2/17    Past Surgical History:  Procedure Laterality Date  . CARDIAC CATHETERIZATION N/A 06/30/2015   Procedure: Left Heart Cath and Coronary Angiography;  Surgeon: Jettie Booze, MD;  Location: [facility];  Service: Cardiovascular;  Laterality: N/A;  . DUODENAL STENT PLACEMENT N/A 12/06/2015   Procedure: DUODENAL STENT PLACEMENT;  Surgeon: Carol Ada, MD;  [facility];  Service: Endoscopy;  Laterality: N/A;  . ENDARTERECTOMY FEMORAL Right 06/02/2015   Procedure: ENDARTERECTOMY RIGHT PROFUNDA FEMORIS ARTERY;  Surgeon: Mal Misty, MD;  Location: [facility];  Service: Vascular;  Laterality: Right;  . ERCP N/A 12/02/2015   Procedure: ENDOSCOPIC RETROGRADE CHOLANGIOPANCREATOGRAPHY (ERCP);  Surgeon: Carol Ada, MD;  [facility];  Service: Endoscopy;  Laterality: N/A;  . ESOPHAGOGASTRODUODENOSCOPY (EGD) WITH PROPOFOL N/A 12/06/2015   Procedure: ESOPHAGOGASTRODUODENOSCOPY (EGD) WITH PROPOFOL;  Surgeon: Carol Ada, MD;  [facility];  Service: Endoscopy;  Laterality: N/A;  . EUS N/A 12/06/2015   Procedure: UPPER ENDOSCOPIC ULTRASOUND (EUS) LINEAR;  Surgeon: Carol Ada, MD;  [facility];  Service: Endoscopy;  Laterality: N/A;  . FEMORAL-TIBIAL BYPASS GRAFT Right 06/02/2015   Procedure: RIGHT PROFUNDA FEMORIS TO POSTERIOR TIBIAL ARTERY BYPASS USING RIGHT NON-REVERSED GREATER SAPHENOUS VEIN,  SUBCUTANEOUS TUNNEL;  Surgeon: Mal Misty, MD;  Location: [facility];  Service: Vascular;  Laterality: Right;  . INTRAOPERATIVE ARTERIOGRAM Right 06/02/2015   Procedure: INTRA OPERATIVE ARTERIOGRAM RIGHT LOWER LEG;  Surgeon: Mal Misty, MD;  Location: [facility];  Service: Vascular;  Laterality: Right;  . IR GENERIC HISTORICAL  12/03/2015   IR INT EXT BILIARY DRAIN WITH CHOLANGIOGRAM 12/03/2015 [name], MD [facility]  . No history of Surgery     . PERIPHERAL VASCULAR CATHETERIZATION N/A 06/01/2015   Procedure: Abdominal Aortogram;  Surgeon: Serafina Mitchell, MD;  Location: [facility];  Service: Cardiovascular;  Laterality: N/A;  . PERIPHERAL VASCULAR CATHETERIZATION N/A 07/07/2015   Procedure: Aortic Arch Angiography;  Surgeon: Conrad [place], MD;  Location: [facility];  Service: Cardiovascular;  Laterality: N/A;  . PERIPHERAL VASCULAR CATHETERIZATION N/A 07/07/2015   Procedure: Upper Extremity Angiography;  Surgeon: Conrad Staves, MD;  Location: [facility];  Service: Cardiovascular;  Laterality: N/A;  .  PERIPHERAL VASCULAR CATHETERIZATION Left 07/07/2015   Procedure: Peripheral Vascular Intervention;  Surgeon: Conrad Campbell, MD;  Location: [facility];  Service: Cardiovascular;  Laterality: Left;  left subclavian and axillary  . VEIN HARVEST Right 06/02/2015   Procedure: VEIN HARVEST RIGHT GREATER SAPHENOUS VEIN;  Surgeon: Mal Misty, MD;  Location: [facility];  Service: Vascular;  Laterality: Right;    There were no vitals filed for this visit.       Subjective Assessment - 12/16/15 1157    Subjective Patient is attending GI clinic to see the team.  Patient has pancreas cancer that was diagnosed on 12/06/2015 by endoscopic ultrasound.    Patient is accompained by: Family member  wife   Patient Stated Goals education    Currently in Pain? Yes   Pain Score 5    Pain Location Flank   Pain Orientation Right   Pain Descriptors / Indicators Dull;Sore   Pain Type Acute pain   Pain Onset 1 to 4 weeks ago   Pain Frequency Intermittent   Aggravating Factors  not sure   Pain Relieving Factors stretching   Multiple Pain Sites No            [facility] PT Assessment - 12/16/15 0001      Assessment   Medical Diagnosis Pancreatic Cancer   Referring Provider Dr. Betsy Coder   Onset Date/Surgical Date 12/06/15   Prior Therapy None     Precautions   Precautions Other (comment)   Precaution Comments cancer precautions; ulcers on foot     Restrictions   Weight Bearing Restrictions No     Balance Screen   Has the patient fallen in the past 6 months No   Has the patient had a decrease in activity level because of a fear of falling?  No   Is the patient reluctant to leave their home because of a fear of falling?  No     Home Ecologist residence   Living Arrangements Spouse/significant other     Prior Function   Level of Independence Independent     Cognition   Overall Cognitive Status Within Functional Limits for tasks assessed      Observation/Other Assessments   Focus on Therapeutic Outcomes (FOTO)  Therapist discretion is 50% limitation due to ulcers on foot and medical condition     Posture/Postural Control   Posture/Postural Control No significant limitations     ROM / Strength   AROM / PROM / Strength Strength     Strength   Overall Strength Comments bil. lower extremity strength is 4/5, bil. shoulder flexion is 4/5                           PT Education - 12/16/15 1203    Education provided Yes   Education Details walking program, balance exercises, tips to conserve energy   Person(s) Educated Patient;Spouse   Methods Explanation;Demonstration;Handout   Comprehension Verbalized understanding;Returned demonstration             PT Long Term Goals - 12/16/15 1211      PT LONG TERM GOAL #1   Title education on tips to conserve  energy while having chemotherapy   Time 1   Period Days   Status Achieved     PT LONG TERM GOAL #2   Title balance and walking exercise to decrease rate of falls while having chemotherapy   Time 1   Period Days   Status Achieved               Plan - 12/16/15 1205    Clinical Impression Statement Patient is a 55 year old male with diagnosis of Pancreatic Cancer on 12/06/2015 by endoscopic ultrasound.  Patient is having pain in the right flank at level 5/10 and decreases with stretches.  Patient has several ulcers on his feet and lower leg.  Patient attends wound care for treatment of the ulcers.  Patient is not able to work at this time.  He lives with his wife.  Bilateral lower extremity strength is 4/5 and bilateral shoulder flexion 4/5.  Patient will be having chemotherapy and possible radiation.  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   Patient benefitted from physical therapy to educate on conserving energy, walking program, and balance exercises.    Rehab Potential Good   Clinical Impairments Affecting Rehab Potential ulcers on feet and lower leg; diabetes; ischemic cardiomyopathy   PT Frequency 1x / week   PT Duration --  1 session for GI clinic   PT Treatment/Interventions Patient/family education;Therapeutic exercise   PT Next Visit Plan Discharge to HEP due to 1 time session   PT Home Exercise Plan Current HEP   Recommended Other Services None   Consulted and Agree with Plan of Care Patient;Family member/caregiver   Family Member Consulted wife      Patient will benefit from skilled therapeutic intervention in order to improve the following deficits and impairments:  Pain, Decreased endurance, Decreased activity tolerance, Decreased balance  Visit Diagnosis: Muscle weakness (generalized) - Plan: PT plan of care cert/re-cert     Problem List Patient Active Problem List   Diagnosis Date Noted  . Adenocarcinoma ([place]) 12/14/2015  . Liver enzyme elevation 12/14/2015  . Pancreatic insufficiency ([place]) 12/09/2015  . 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 Obstructive jaundice due to malignant neoplasm 12/01/2015  . Pancreatic mass 12/01/2015  . Obstructive jaundice 12/01/2015  . Former smoker 11/15/2015  . Vaccine counseling 11/15/2015  . Tooth decay 11/15/2015  . Ischemic cardiomyopathy 07/14/2015  . CAD (coronary artery disease) 07/14/2015  . Hypertensive heart disease 07/14/2015  . HLD (hyperlipidemia) 07/14/2015  . Subclavian artery stenosis, left 07/07/2015  . Abnormal stress test   . Ischemic finger 06/28/2015  . Chronic combined systolic and diastolic CHF (congestive heart failure) ([place])   . 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 Preop cardiovascular exam 06/01/2015  . Carotid bruit 06/01/2015  . Nonhealing ulcer of right lower extremity  ([place])   . DM (diabetes mellitus), type 2 with peripheral vascular complications ([place]) [id number]  . Femoral bruit 05/31/2015  . Smoker 05/31/2015  . Skin breakdown 05/31/2015  . Leg ulcer, left ([place]) 05/31/2015  . Type 2 diabetes mellitus with diabetic neuropathy, without long-term current use of insulin ([place]) 05/31/2015  . Right foot pain 05/31/2015  . PAD (peripheral artery disease) ([place]) 05/31/2015    Earlie Counts, PT 12/16/15 12:14 PM   [facility]  [place] [address], [place], [place], [address] Phone: [phone]   Fax:  [phone]  Name: Lindon Max MRN: [id number] Date of Birth: Dec 18, 1960

## 2015-12-16 NOTE — Patient Instructions (Signed)
Care Plan Summary- 12/16/2015 Name:  Donald Steele      DOB: Oct 15, 1960 Your Medical Team:  Medical Oncologist:  Dr. Ma Rings Radiation Oncologist:  Dr. Kyung Rudd Surgeon:   Dr. [name] Type of Cancer: Adenocarcinoma of Pancreas  Stage/Grade: Undetermined  *Exact staging of your cancer is based on size of the tumor, depth of invasion, involvement of lymph nodes or not, and whether or not the cancer has spread beyond the primary site.   Recommendations: Based on information available as of today's consult. Recommendations may change depending on the results of further tests or exams. 1) Chemotherapy with Gemzar & Abraxane for 2-3 months then restage with scans 2) Could do radiation therapy after initial chemotherapy is completed if needed 3) Re-evaluate for surgery after restaging scan (after chemo) _______________________________________________________________________ Next Steps: 1) Dr. Marlowe Aschoff office will call with the date of your port placement 2) Return to Dr. Benay Spice in 2 weeks to discuss starting treatment 3) [facility]ology on 12/22/15 at 0730 for drain Questions? Merceda Elks, RN, BSN at [phone]. Manuela Schwartz is your Oncology Nurse Navigator and is available to assist you while you're receiving your medical care at [facility]

## 2015-12-16 NOTE — Progress Notes (Signed)
Oncology Nurse Navigator Documentation  Oncology Nurse Navigator Flowsheets 12/16/2015  Navigator Location [facility]-Med Onc  Navigator Encounter Type Clinic/MDC  Abnormal Finding Date -  Confirmed Diagnosis Date -  Patient Visit Type MedOnc;RadOnc;Surgery  Treatment Phase Pre-Tx/Tx Discussion  Barriers/Navigation Needs Education  Education Newly Diagnosed Cancer Education;Understanding Cancer/ Treatment Options;Preparing for Upcoming Surgery/ Treatment  Interventions Education Method;Medication assitance--provided [place] copay card with instructions on how to activate  Education Method Verbal;Written;Teach-back  Support Groups/Services GI Support Group;[place]  Acuity Level 2  Time Spent with Patient 20  Met with patient and girlfriend, Geni Bers after GI [place] visit. Explained the role of the GI Nurse Navigator and provided New Patient Packet with information on: 1. Pancreas cancer--anatomy of pancreas; CA 19.9 marker test, port information 2. Support groups 3. Advanced Directives 4. Fall Safety Plan Answered questions, reviewed current treatment plan using TEACH back and provided emotional support. Provided copy of current treatment plan. Explained to patient where to register for his procedure on 12/22/15 in IR to have his drain internalized.  He understands he will return in 2 weeks to discuss chemotherapy again with Dr. Benay Spice, who recommends Gemzar/Abraxane. Dr. Barry Dienes will work on getting his PAC placed. He understands he needs to get his leg ulcers healing better before starting chemotherapy. His girlfriend is very attentive and asks appropriate questions during his appointment.  Merceda Elks, RN, BSN GI Oncology [place]

## 2015-12-16 NOTE — Progress Notes (Signed)
[facility] Psychosocial Distress Screening Clinical Social Work  Clinical Social Work met with pt and his girlfriend, Jacque at [facility] Clinic to introduce self, explain role of CSW and to review the distress screening protocol.  The patient scored a 0 on the Psychosocial Distress Thermometer which indicates no distress. Clinical Social Worker met with pt and significant other to assess for distress and other psychosocial needs. Pt denied having any anxiety or stress related to his diagnosis currently. He shared he was stressed when he first found out, but now is better. 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 He has been on STD through his work and has insurance through work as well. Pt's GF has assisted him in starting the application for SS disability and he had a phone interview recently. Girlfriend is very involved and supportive, she works full-time as a housekeeper. They report to have a car for transportation. CSW educated them about additional resources to assist, such as support programs, financial counselor, etc. They appreciated assistance and plan to follow up accordingly.   ONCBCN DISTRESS SCREENING 12/16/2015  Screening Type Initial Screening  Distress experienced in past week (1-10) 0    Clinical Social Worker follow up needed: No.  If yes, follow up plan:  Grier Hock, LCSW Clinical Social Worker [facility]  [facility] Phone: [phone] Fax: [phone]    

## 2015-12-16 NOTE — Patient Instructions (Signed)
Tandem Stance    Can hold onto counter if not feeling steady. Right foot in front of left, heel touching toe both feet "straight ahead". Stand on Foot Triangle of Support with both feet. Balance in this position _30__ seconds. Do with left foot in front of right. 3 times each  Copyright  VHI. All rights reserved.    Stance: single leg on floor. Raise leg. Hold _15__ seconds. Repeat with other leg. __3_ reps per set, _1__ sets per day .  Can hold onto counter to keep steady  Copyright  VHI. All rights reserved.    Tips for Energy Conservation for Activities of Daily Living . Plan ahead to avoid rushing. . Sit down to bathe and dry off. Wear a terry robe instead of drying off. . Use a shower/bath organizer to decrease leaning and reaching. . Use extension handles on sponges and brushes. Susa Simmonds grab rails in the bathroom or use an elevated toilet seat. Hoyle Barr out clothes and toiletries before dressing. . Minimize leaning over to put on clothes and shoes. Bring your foot to your knee to apply socks and shoes. . Wear comfortable shoes and low-heeled, slip on shoes. Wear button front shirts rather than pullovers. Housekeeping . Schedule household tasks throughout the week. . Do housework sitting down when possible. . Delegate heavy housework, shopping, laundry and child care when possible. . Drag or slide objects rather than lifting. . Sit when ironing and take rest periods. . Stop working before becoming overly tired. Shopping . Organize list by aisle. . Use a grocery cart for support. Marland Kitchen Shop at less busy times. . Ask for help with getting to the car. Meal Preparation . Use convenience and easy-to-prepare foods. . Use small appliances that take less effort to use. Marland Kitchen Prepare meals sitting down. . Soak dishes instead of scrubbing and let dishes air dry. . Prepare double portions and freeze half. Child Care . Plan activities that can be done sitting down, such as drawing  pictures, playing games, reading, and computer games. . Encourage children to climb up onto your lap or into the highchair instead of being lifted. . Make a game of the household chores so that children will want to help. . Delegate child care when possible.  Earlie Counts, PT, [facility]; [place], [place], [place] [id number]   Ways to get started on an exercise program 1.  Start for 10 minutes per day with a walking program. 2. Work towards 15 minutes of exercise per day 3. When you do an aerobic exercise program start on a low level 4. Water aerobics is a good place due to decreased strain on your joints 5. Begin your exercise program gradually and progress slowly over time 6. When exercising use correct form. a. Keep neutral spine b. Engage abdominals c. Keep chest up  d. Chin down e. Do not lock your knees  Earlie Counts, PT [facility] [address], [place], [place] [address] [phone]

## 2015-12-20 ENCOUNTER — Telehealth: Payer: Self-pay | Admitting: *Deleted

## 2015-12-20 ENCOUNTER — Encounter (HOSPITAL_BASED_OUTPATIENT_CLINIC_OR_DEPARTMENT_OTHER): Payer: BLUE CROSS/BLUE SHIELD | Attending: Surgery

## 2015-12-20 ENCOUNTER — Other Ambulatory Visit: Payer: Self-pay | Admitting: Radiology

## 2015-12-20 DIAGNOSIS — E11621 Type 2 diabetes mellitus with foot ulcer: Secondary | ICD-10-CM | POA: Diagnosis present

## 2015-12-20 DIAGNOSIS — C250 Malignant neoplasm of head of pancreas: Secondary | ICD-10-CM | POA: Diagnosis not present

## 2015-12-20 DIAGNOSIS — Z87891 Personal history of nicotine dependence: Secondary | ICD-10-CM | POA: Insufficient documentation

## 2015-12-20 DIAGNOSIS — M86372 Chronic multifocal osteomyelitis, left ankle and foot: Secondary | ICD-10-CM | POA: Insufficient documentation

## 2015-12-20 DIAGNOSIS — L97522 Non-pressure chronic ulcer of other part of left foot with fat layer exposed: Secondary | ICD-10-CM | POA: Diagnosis not present

## 2015-12-20 DIAGNOSIS — I70248 Atherosclerosis of native arteries of left leg with ulceration of other part of lower left leg: Secondary | ICD-10-CM | POA: Insufficient documentation

## 2015-12-20 DIAGNOSIS — L97822 Non-pressure chronic ulcer of other part of left lower leg with fat layer exposed: Secondary | ICD-10-CM | POA: Insufficient documentation

## 2015-12-20 MED ORDER — PANCRELIPASE (LIP-PROT-AMYL) 24000-76000 UNITS PO CPEP: 2.0000 | ORAL_CAPSULE | Freq: Three times a day (TID) | ORAL | 6 refills | Status: DC

## 2015-12-20 NOTE — Telephone Encounter (Signed)
Girlfriend called and left VM X4 within 2 hour time period that [facility] [address] [facility]on and nurse needs to [facility] [address]. This RN confirmed with [facility] that [facility] was received by [address] and they forwarded it to [facility] and it is ready for pick up. Patient needs to bring his copay card and his insurance card. Notified Geni Bers that script is ready and to bring both cards. She tells RN that she has called to activate the copay card.

## 2015-12-20 NOTE — Telephone Encounter (Signed)
Oncology Nurse Navigator Documentation  Oncology Nurse Navigator Flowsheets 12/20/2015  Navigator Location [facility]  Navigator Encounter Type Telephone  Telephone Incoming Call;Outgoing Call;Medication Assistance  Abnormal Finding Date -  Confirmed Diagnosis Date -  Patient Visit Type -  Treatment Phase -  Barriers/Navigation Needs Family concerns  Education   Interventions Medication assistance--called in the script Dr. Barry [facility]ien[facility] and left message for pharmacist to assist them with activation of his copay assist card from [place] -  Time Spent with Patient 15  HIs girlfriend had left VM twice on Friday night reporting that [facility] not [facility] Creon that Dr. Barry Dienes had e-scribed on 12/16/15. Verified the script on [facility] chart and [facility]. Girlfriend notified.

## 2015-12-20 NOTE — Telephone Encounter (Signed)
Message from [place] with [facility] Dr. Gearldine Shown DEA # for Rx that was called in today. Returned call with this info.

## 2015-12-21 ENCOUNTER — Telehealth: Payer: Self-pay | Admitting: *Deleted

## 2015-12-21 NOTE — Telephone Encounter (Signed)
Oncology Nurse Navigator Documentation  Oncology Nurse Navigator Flowsheets 12/21/2015  Navigator Location [facility]  Navigator Encounter Type Telephone  Telephone Incoming Call;Outgoing Call;Medication Assistance;Symptom Manag--girlfriend left VM that Creon was not covered and the copay card did not work.   Abnormal Finding Date -  Confirmed Diagnosis Date -  Patient Visit Type -  Treatment Phase -  Barriers/Navigation Needs Family concerns--patient not eating; encouraged her to offer him small snacks every 3 hours and to take his pain pill for pain at external drain site  Education Pain/ Symptom Management;Concerns with Insurance Coverage  Interventions Medication assistance--message to L. Chrismon at [employer] to work on [place] -  Time Spent with Patient 30  Confirmed with [facility] that [facility] rejected the Creon as "not covered". She suggests trying to do a prior authorization first at [phone] and use ID [id number]. The copay card did not work since [employer] did not cover the drug. Discussed status of things with his girlfriend, Geni Bers. Inquired what symptoms he is having that we can try to address while waiting for PA. She said he is not nauseated, had good BM today. Pain is at drain site. Just has no appetite and won't eat. Will inquire with MD if an appetite stimulant could be prescribed for him. Reminded her this is all due to his cancer and goal is that treatment will help him feel like eating again.

## 2015-12-21 NOTE — Progress Notes (Signed)
Radiation Oncology         [phone] ________________________________  Name: Donald Steele MRN: [id number]  Date: 12/16/2015  DOB: 1960/05/21  [id number], DAVID Audelia Acton, PA-C  Tysinger, Camelia Eng, PA-C     REFERRING PHYSICIAN: Carlena Hurl, PA-C   DIAGNOSIS: The encounter diagnosis was Cancer of head of pancreas ([place]).   HISTORY OF PRESENT ILLNESS::Donald Steele is a 55 y.o. male who is seen for an initial consultation visit regarding the patient's diagnosis of pancreatic cancer. The patient developed some jaundice as well as abdominal pain and vomiting. A CT scan of the abdomen and pelvis revealed a mildly dilated common bile duct as well as an ill-defined mass noted in the pancreatic head with dilation of the pancreatic duct as well.The mass was noted to be closely approximated to the gastric antrum. No significant adenopathy was seen. An ERCP procedure was performed. Ulceration of the duodenal bulb was noted. The common bile duct could not be cannulated and therefore interventional radiology was consulted for placement of an internal/external biliary drain on 12/03/2015.  The patient proceeded with an endoscopic ultrasound on 12/06/2015. Stenosis was noted in the duodenal bulb. A nonbleeding ulcer was present in the first portion of the duodenum. Biopsies were taken. The mass was noted to most likely originate from the pancreatic head. 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    PREVIOUS RADIATION THERAPY: No   PAST MEDICAL HISTORY:  has a past medical history of Cardiomyopathy ([place]); Carotid stenosis; Chronic combined systolic and diastolic CHF (congestive heart failure) ([place]); Diabetes mellitus; History of nuclear stress test; Hyperlipidemia; and Peripheral vascular disease due to secondary diabetes mellitus ([place]).     PAST SURGICAL HISTORY: Past Surgical History:  Procedure  Laterality Date  . CARDIAC CATHETERIZATION N/A 06/30/2015   Procedure: Left Heart Cath and Coronary Angiography;  Surgeon: Jettie Booze, MD;  Location: [place];  Service: Cardiovascular;  Laterality: N/A;  . DUODENAL STENT PLACEMENT N/A 12/06/2015   Procedure: DUODENAL STENT PLACEMENT;  Surgeon: Carol Ada, MD;  Location: [facility];  Service: Endoscopy;  Laterality: N/A;  . ENDARTERECTOMY FEMORAL Right 06/02/2015   Procedure: ENDARTERECTOMY RIGHT PROFUNDA FEMORIS ARTERY;  Surgeon: Mal Misty, MD;  Location: [facility];  Service: Vascular;  Laterality: Right;  . ERCP N/A 12/02/2015   Procedure: ENDOSCOPIC RETROGRADE CHOLANGIOPANCREATOGRAPHY (ERCP);  Surgeon: Carol Ada, MD;  Location: [facility];  Service: Endoscopy;  Laterality: N/A;  . ESOPHAGOGASTRODUODENOSCOPY (EGD) WITH PROPOFOL N/A 12/06/2015   Procedure: ESOPHAGOGASTRODUODENOSCOPY (EGD) WITH PROPOFOL;  Surgeon: Carol Ada, MD;  [facility]: [facility];  Service: Endoscopy;  Laterality: N/A;  . EUS N/A 12/06/2015   Procedure: UPPER ENDOSCOPIC ULTRASOUND (EUS) LINEAR;  Surgeon: Carol Ada, MD;  [facility];  Service: Endoscopy;  Laterality: N/A;  . FEMORAL-TIBIAL BYPASS GRAFT Right 06/02/2015   Procedure: RIGHT PROFUNDA FEMORIS TO POSTERIOR TIBIAL ARTERY BYPASS USING RIGHT NON-REVERSED GREATER SAPHENOUS VEIN,  SUBCUTANEOUS TUNNEL;  Surgeon: Mal Misty, MD;  Location: [facility];  Service: Vascular;  Laterality: Right;  . INTRAOPERATIVE ARTERIOGRAM Right 06/02/2015   Procedure: INTRA OPERATIVE ARTERIOGRAM RIGHT LOWER LEG;  Surgeon: Mal Misty, MD;  Location: [place] [facility];  Service: Vascular;  Laterality: Right;  . IR GENERIC HISTORICAL  12/03/2015   IR INT EXT BILIARY DRAIN WITH CHOLANGIOGRAM 12/03/2015 [name], MD [facility]  . No history of Surgery     . PERIPHERAL VASCULAR CATHETERIZATION  N/A 06/01/2015   Procedure: Abdominal Aortogram;  Surgeon: Serafina Mitchell, MD;  Location: [facility];   Service: Cardiovascular;  Laterality: N/A;  . PERIPHERAL VASCULAR CATHETERIZATION N/A 07/07/2015   Procedure: Aortic Arch Angiography;  Surgeon: Conrad Lowndesville, MD;  Location: [place];  Service: Cardiovascular;  Laterality: N/A;  . PERIPHERAL VASCULAR CATHETERIZATION N/A 07/07/2015   Procedure: Upper Extremity Angiography;  Surgeon: Conrad Charlo, MD;  Location: [place] [facility];  Service: Cardiovascular;  Laterality: N/A;  . PERIPHERAL VASCULAR CATHETERIZATION Left 07/07/2015   Procedure: Peripheral Vascular Intervention;  Surgeon: Conrad Woodhull, MD;  Location: [facility];  Service: Cardiovascular;  Laterality: Left;  left subclavian and axillary  . VEIN HARVEST Right 06/02/2015   Procedure: VEIN HARVEST RIGHT GREATER SAPHENOUS VEIN;  Surgeon: Mal Misty, MD;  Location: [facility];  Service: Vascular;  Laterality: Right;     FAMILY HISTORY: family history is not on file.   SOCIAL HISTORY:  reports that he has quit smoking. He has a 35.00 pack-year smoking history. He has never used smokeless tobacco. He reports that he does not drink alcohol or use drugs.   ALLERGIES: Review of patient's allergies indicates no known allergies.   MEDICATIONS:  Current Outpatient Prescriptions  Medication Sig Dispense Refill  . aspirin 81 MG EC tablet Take 1 tablet (81 mg total) by mouth daily. Swallow whole. 90 tablet 3  . atorvastatin (LIPITOR) 40 MG tablet Take 1 tablet (40 mg total) by mouth daily. 90 tablet 3  . clopidogrel (PLAVIX) 75 MG tablet Take 1 tablet (75 mg total) by mouth daily. 90 tablet 3  . collagenase (SANTYL) ointment Apply topically daily. Apply to right foot wound daily. Place moist saline gauze on top and wrap foot daily. 15 g 0  . doxycycline (VIBRA-TABS) 100 MG tablet Take 1 tablet (100 mg total) by mouth 2 (two) times daily. 20 tablet 0  . furosemide (LASIX) 20 MG tablet Take 1 tablet (20 mg total) by mouth daily. 90 tablet 3  . insulin glargine (LANTUS) 100 UNIT/ML  injection Inject 0.14 mLs (14 Units total) into the skin at bedtime. 10 mL 11  . losartan (COZAAR) 25 MG tablet Take 1 tablet (25 mg total) by mouth daily. (Patient not taking: Reported on 12/14/2015) 90 tablet 3  . metoprolol succinate (TOPROL XL) 25 MG 24 hr tablet Take 1 tablet (25 mg total) by mouth daily. 90 tablet 3  . NEEDLE, DISP, 30 G (BD DISP NEEDLES) 30G X 1/2" MISC 1 each by Does not apply route at bedtime. (Patient not taking: Reported on 12/14/2015) 90 each 3  . nitroGLYCERIN (NITROSTAT) 0.4 MG SL tablet Place 1 tablet (0.4 mg total) under the tongue every 5 (five) minutes as needed for chest pain. (Patient not taking: Reported on 12/14/2015) 25 tablet 3  . Pancrelipase, Lip-Prot-Amyl, (CREON) 24000 units CPEP Take 2 capsules (48,000 Units total) by mouth 3 (three) times daily with meals. 180 capsule 6  . pregabalin (LYRICA) 200 MG capsule Take 1 capsule (200 mg total) by mouth 2 (two) times daily. 60 capsule 2  . traMADol (ULTRAM) 50 MG tablet Take 1 tablet (50 mg total) by mouth every 12 (twelve) hours as needed for moderate pain or severe pain. (Patient not taking: Reported on 12/14/2015) 45 tablet 0   No current facility-administered medications for this encounter.      REVIEW OF SYSTEMS:  A 15 point review of systems is documented in the  electronic medical record. This was obtained by the nursing staff. However, I reviewed this with the patient to discuss relevant findings and make appropriate changes.  Pertinent items are noted in HPI.    PHYSICAL EXAM:  vitals were not taken for this visit.  ECOG = 1  0 - Asymptomatic (Fully active, able to carry on all predisease activities without restriction)  1 - Symptomatic but completely ambulatory (Restricted in physically strenuous activity but ambulatory and able to carry out work of a light or sedentary nature. For example, light housework, office work)  2 - Symptomatic, <50% in bed during the day (Ambulatory and capable of all self  care but unable to carry out any work activities. Up and about more than 50% of waking hours)  3 - Symptomatic, >50% in bed, but not bedbound (Capable of only limited self-care, confined to bed or chair 50% or more of waking hours)  4 - Bedbound (Completely disabled. Cannot carry on any self-care. Totally confined to bed or chair)  5 - Death   Eustace Pen MM, Creech RH, Tormey DC, et al. [phone]). "Toxicity and response criteria of the [facility] Group". [place] Oncol. 5 (6): 649-55  General: Well-developed, in no acute distress HEENT: Normocephalic, atraumatic; oral cavity clear Neck: Supple without any lymphadenopathy Cardiovascular: Regular rate and rhythm Respiratory: Clear to auscultation bilaterally GI: Soft, nontender, normal bowel sounds Extremities: No edema present Neuro: No focal deficits     LABORATORY DATA:  Lab Results  Component Value Date   WBC 7.6 12/08/2015   HGB 10.8 (L) 12/08/2015   HCT 33.3 (L) 12/08/2015   MCV 87.4 12/08/2015   PLT 380 12/08/2015   Lab Results  Component Value Date   NA 131 (L) 12/14/2015   K 4.6 12/14/2015   CL 95 (L) 12/14/2015   CO2 26 12/14/2015   Lab Results  Component Value Date   ALT 39 12/14/2015   AST 21 12/14/2015   ALKPHOS 246 (H) 12/14/2015   BILITOT 3.2 (H) 12/14/2015      RADIOGRAPHY: Dg Abd 1 View  Result Date: 12/06/2015 CLINICAL DATA:  Duodenal stent placement EXAM: ABDOMEN - 1 VIEW COMPARISON:  Fluoroscopic series of December 05, 2015 FINDINGS: A duodenal stent is in place in the expected location of the descending duodenum. A percutaneous biliary drainage tube is in place and appears to be in stable position. There is barium within the colon and rectum. No small or large bowel obstructive pattern is observed. IMPRESSION: No evidence of bowel obstruction. No complication observed following biliary stent placement. Electronically Signed   By: David  Martinique M.D.   On: 12/06/2015 17:22   Ct Chest W  Contrast  Result Date: 11/30/2015 CLINICAL DATA:  Initial evaluation for weight loss, elevated liver function tests, jaundice. EXAM: CT CHEST, ABDOMEN, AND PELVIS WITH CONTRAST TECHNIQUE: Multidetector CT imaging of the chest, abdomen and pelvis was performed following the standard protocol during bolus administration of intravenous contrast. CONTRAST:  133mL ISOVUE-300 IOPAMIDOL (ISOVUE-300) INJECTION 61% COMPARISON:  None available. FINDINGS: CT CHEST FINDINGS Few scattered subcentimeter hypodense nodules partially visualize within the thyroid gland, of doubtful clinical significance. Visualized thyroid otherwise unremarkable. No pathologically enlarged mediastinal, hilar, or axillary lymph nodes identified. Intrathoracic aorta of normal caliber without acute abnormality. Scattered calcified plaque within the aortic arch in at the origin of the great vessels. Vascular stent in place at the proximal left subclavian artery. Flow within the stent appears to be patent. No high-grade stenosis at the  origin of the great vessels. Heart size within normal limits. Prominent coronary artery calcifications noted, most evident within the LAD. No pericardial effusion. Limited evaluation the pulmonary arteries grossly unremarkable. The lungs are clear without focal infiltrate, pulmonary edema, or pleural effusion. No pneumothorax. Single 4 mm nodular density noted within the superior segment of the right lower lobe (series 4, image 63), indeterminate. Additional 2 mm nodular density along the right major fissure (series 4, image 53), most consistent with a small intra pulmonic lymph node. No other worrisome pulmonary nodule or mass identified. No acute osseous abnormality. No worrisome lytic or blastic osseous lesions. CT ABDOMEN PELVIS FINDINGS Few scattered cystic lucencies noted within the right hepatic lobe near the gallbladder fossa, largest of which measures 1.5 x 1.7 cm (series 2, image 55). These are indeterminate,  and may in reflect dilated biliary ducts. Liver otherwise unremarkable with no other focal intrahepatic lesions identified. Gallbladder somewhat hydropic in appearance without acute inflammatory changes. Common bile duct mildly dilated at 8 mm. Mild intrahepatic biliary dilatation at the hilum of the liver. No cholelithiasis or choledocholithiasis identified. Spleen and adrenal glands within normal limits. There is a somewhat ill-defined hypodense mass centered at the pancreatic head which measures approximately 3.1 x 3.9 x 2.9 cm, highly concerning for primary pancreatic neoplasm. Pancreas at this level is somewhat ill-defined which shaggy margins. There is dilatation of the pancreatic duct distally up to 4 mm. Remainder the pancreatic body and tail are somewhat atrophic in appearance. Mass closely approximates the gastric antrum/pyloric region anteriorly as well as the duodenum laterally. Fat about the celiac axis and SMA is well preserved without frank vascular encasement. No significant peripancreatic adenopathy. Kidneys are equal in size with symmetric enhancement. No nephrolithiasis, hydronephrosis, or focal enhancing renal mass. Stomach within normal limits. No evidence for bowel obstruction. No abnormal wall thickening, mucosal enhancement, or inflammatory fat stranding seen about the bowels. Appendix well visualized in the right lower quadrant and is normal caliber and appearance without associated inflammatory changes suggest acute appendicitis. Moderate amount of retained stool within the colon. Partially distended bladder within normal limits without acute abnormality. Prostate within normal limits. No free intraperitoneal air. No free fluid. No pathologically enlarged intra-abdominal or pelvic lymph nodes. Postsurgical changes noted within the right groin. Advanced aorto bi-iliac atherosclerotic disease. No aneurysm. No acute osseous abnormality. No worrisome lytic or blastic osseous lesions.  IMPRESSION: 1. Poorly defined hypodense mass at the pancreatic head, measuring approximately 3.1 x 3.9 x 2.9 cm, highly concerning for primary pancreatic neoplasm. Associated mild pancreatic and biliary ductal dilatation as above. 2. Hypodense lesions at the central aspect of the liver, somewhat indeterminate, and may reflect focally dilated biliary ducts. Possible metastases not entirely excluded. 3. Single small 4 mm nodular density within the superior segment of the right lower lobe, indeterminate. Attention at follow-up recommended. 4. Prominent coronary artery and aorto bi-iliac atherosclerotic disease. Electronically Signed   By: Jeannine Boga M.D.   On: 11/30/2015 17:02   Ct Abdomen Pelvis W Contrast  Result Date: 11/30/2015 CLINICAL DATA:  Initial evaluation for weight loss, elevated liver function tests, jaundice. EXAM: CT CHEST, ABDOMEN, AND PELVIS WITH CONTRAST TECHNIQUE: Multidetector CT imaging of the chest, abdomen and pelvis was performed following the standard protocol during bolus administration of intravenous contrast. CONTRAST:  131mL ISOVUE-300 IOPAMIDOL (ISOVUE-300) INJECTION 61% COMPARISON:  None available. FINDINGS: CT CHEST FINDINGS Few scattered subcentimeter hypodense nodules partially visualize within the thyroid gland, of doubtful clinical significance. Visualized thyroid otherwise unremarkable.  No pathologically enlarged mediastinal, hilar, or axillary lymph nodes identified. Intrathoracic aorta of normal caliber without acute abnormality. Scattered calcified plaque within the aortic arch in at the origin of the great vessels. Vascular stent in place at the proximal left subclavian artery. Flow within the stent appears to be patent. No high-grade stenosis at the origin of the great vessels. Heart size within normal limits. Prominent coronary artery calcifications noted, most evident within the LAD. No pericardial effusion. Limited evaluation the pulmonary arteries grossly  unremarkable. The lungs are clear without focal infiltrate, pulmonary edema, or pleural effusion. No pneumothorax. Single 4 mm nodular density noted within the superior segment of the right lower lobe (series 4, image 63), indeterminate. Additional 2 mm nodular density along the right major fissure (series 4, image 53), most consistent with a small intra pulmonic lymph node. No other worrisome pulmonary nodule or mass identified. No acute osseous abnormality. No worrisome lytic or blastic osseous lesions. CT ABDOMEN PELVIS FINDINGS Few scattered cystic lucencies noted within the right hepatic lobe near the gallbladder fossa, largest of which measures 1.5 x 1.7 cm (series 2, image 55). These are indeterminate, and may in reflect dilated biliary ducts. Liver otherwise unremarkable with no other focal intrahepatic lesions identified. Gallbladder somewhat hydropic in appearance without acute inflammatory changes. Common bile duct mildly dilated at 8 mm. Mild intrahepatic biliary dilatation at the hilum of the liver. No cholelithiasis or choledocholithiasis identified. Spleen and adrenal glands within normal limits. There is a somewhat ill-defined hypodense mass centered at the pancreatic head which measures approximately 3.1 x 3.9 x 2.9 cm, highly concerning for primary pancreatic neoplasm. Pancreas at this level is somewhat ill-defined which shaggy margins. There is dilatation of the pancreatic duct distally up to 4 mm. Remainder the pancreatic body and tail are somewhat atrophic in appearance. Mass closely approximates the gastric antrum/pyloric region anteriorly as well as the duodenum laterally. Fat about the celiac axis and SMA is well preserved without frank vascular encasement. No significant peripancreatic adenopathy. Kidneys are equal in size with symmetric enhancement. No nephrolithiasis, hydronephrosis, or focal enhancing renal mass. Stomach within normal limits. No evidence for bowel obstruction. No abnormal  wall thickening, mucosal enhancement, or inflammatory fat stranding seen about the bowels. Appendix well visualized in the right lower quadrant and is normal caliber and appearance without associated inflammatory changes suggest acute appendicitis. Moderate amount of retained stool within the colon. Partially distended bladder within normal limits without acute abnormality. Prostate within normal limits. No free intraperitoneal air. No free fluid. No pathologically enlarged intra-abdominal or pelvic lymph nodes. Postsurgical changes noted within the right groin. Advanced aorto bi-iliac atherosclerotic disease. No aneurysm. No acute osseous abnormality. No worrisome lytic or blastic osseous lesions. IMPRESSION: 1. Poorly defined hypodense mass at the pancreatic head, measuring approximately 3.1 x 3.9 x 2.9 cm, highly concerning for primary pancreatic neoplasm. Associated mild pancreatic and biliary ductal dilatation as above. 2. Hypodense lesions at the central aspect of the liver, somewhat indeterminate, and may reflect focally dilated biliary ducts. Possible metastases not entirely excluded. 3. Single small 4 mm nodular density within the superior segment of the right lower lobe, indeterminate. Attention at follow-up recommended. 4. Prominent coronary artery and aorto bi-iliac atherosclerotic disease. Electronically Signed   By: Jeannine Boga M.D.   On: 11/30/2015 17:02   Mr Foot Left Wo Contrast  Result Date: 12/02/2015 CLINICAL DATA:  Pt's wounds on his left leg and left small toe were rewrapped. The area has yellowish green drainage and  a foul smell. Pt states he has not changed his bandage since Tuesday when the wound care center changed her bandage . History of diabetes. EXAM: MRI OF THE LEFT FOOT WITHOUT CONTRAST TECHNIQUE: Multiplanar, multisequence MR imaging was performed. No intravenous contrast was administered. COMPARISON:  Left foot x-rays 11/22/2015 FINDINGS: Patient motion degrades image  quality limiting evaluation. Bones/Joint/Cartilage Soft tissue ulceration along the dorsal lateral aspect of the fifth phalanx extending down to the cortex. Mild marrow edema within the distal aspect of the fifth proximal phalanx and the base of the fifth middle phalanx without a definitive cortical destruction or periosteal reaction. No other marrow signal abnormality. No fracture or dislocation. Normal alignment. No joint effusion. Ligaments Collateral ligaments are intact. Muscles and Tendons Visualized flexor, peroneal and extensor compartment tendons are intact. Diffuse T2 hyperintense signal throughout the plantar musculature likely neurogenic. Soft tissue No fluid collection or hematoma. No soft tissue mass. Mild soft tissue edema along the dorsal aspect of the midfoot and forefoot. IMPRESSION: 1. Soft tissue ulceration along the dorsal lateral aspect of the fifth phalanx extending down to the cortex. Mild marrow edema within the distal aspect of the fifth proximal phalanx and the base of the fifth middle phalanx without a definitive cortical destruction or periosteal reaction differential considerations include osteomyelitis versus reactive marrow edema. Electronically Signed   By: Kathreen Devoid   On: 12/02/2015 08:02   Dg Foot Complete Left  Result Date: 11/22/2015 CLINICAL DATA:  Osteomyelitis. EXAM: LEFT FOOT - COMPLETE 3+ VIEW COMPARISON:  08/16/2015. FINDINGS: Erosive changes of the distal phalanx of the left fifth digit cannot be excluded. Osteomyelitis cannot be excluded. Diffuse degenerative change. No radiopaque foreign body . No other focal abnormality IMPRESSION: 1. Erosive changes noted of the distal phalanx of the left fifth digit cannot be excluded. Osteomyelitis cannot be excluded. 2. Diffuse degenerative change.  No other acute abnormality. Electronically Signed   By: Marcello Moores  Register   On: 11/22/2015 13:45   Dg Duanne Limerick  W/kub  Result Date: 12/05/2015 CLINICAL DATA:  Pancreas cancer.   Evaluate for obstruction. EXAM: UPPER GI SERIES WITH KUB TECHNIQUE: After obtaining a scout radiograph a routine upper GI series was performed using thin barium FLUOROSCOPY TIME:  Radiation Exposure Index (as provided by the fluoroscopic device): 107.4 mGy COMPARISON:  None. FINDINGS: On the scout radiograph there is a percutaneous transhepatic biliary drainage catheter overlying the right upper quadrant of the abdomen. Enteric contrast material from recent CT of the abdomen and pelvis is identified throughout the colon up to the level of the rectum. The patient ingested approximately 8 ounces of thin barium. When the patient was placed in the right lateral orientation there was prompt emptying of the stomach into the duodenum. There is a focal area of marked circumferential luminal narrowing involving the proximal portion of the descending duodenum. Here, the lumen of the duodenum is narrowed to approximately 4 mm. IMPRESSION: 1. Focal area of marked luminal narrowing involving the proximal portion of the descending duodenum secondary to pancreas mass. Here, the lumen of the duodenum measures approximately 4 mm. Electronically Signed   By: Kerby Moors M.D.   On: 12/05/2015 12:04   Dg C-arm 61-120 Min-no Report  Result Date: 12/02/2015 CLINICAL DATA: ercp C-ARM 61-120 MINUTES Fluoroscopy was utilized by the requesting physician.  No radiographic interpretation.   Ir Int Lianne Cure Biliary Drain With Cholangiogram  Result Date: 12/03/2015 INDICATION: Obstructive jaundice, central pancreatic mass, unsuccessfully ERCP stent insertion. EXAM: IR INT-EXT BILIARY DRAIN W/  CHOLANGIOGRAM MEDICATIONS: 3.375 g Zosyn; The antibiotic was administered within an appropriate time frame prior to the initiation of the procedure. ANESTHESIA/SEDATION: Moderate (conscious) sedation was employed during this procedure. A total of Versed 6 mg and Fentanyl 300 mcg was administered intravenously. Moderate Sedation Time: 73 minutes. The  patient's level of consciousness and vital signs were monitored continuously by radiology nursing throughout the procedure under my direct supervision. FLUOROSCOPY TIME:  Fluoroscopy Time: 30 minutes 36 seconds (614 mGy). COMPLICATIONS: None immediate. PROCEDURE: Informed written consent was obtained from the patient after a thorough discussion of the procedural risks, benefits and alternatives. All questions were addressed. Maximal Sterile Barrier Technique was utilized including caps, mask, sterile gowns, sterile gloves, sterile drape, hand hygiene and skin antiseptic. A timeout was performed prior to the initiation of the procedure. Under sterile conditions and local anesthesia, initially percutaneous ultrasound access performed of the right hepatic lobe in the mid axillary line with a 21 gauge needle. Several passes were made. Eventually a peripheral right hepatic bile duct was accessed for cholangiogram. Cholangiogram performed. This demonstrates minor intrahepatic biliary dilatation. Extrahepatic common bile duct is dilated with obstruction distally. Several additional 21 gauge needle passes were made through the liver with ultrasound and fluoroscopy. Eventually, an inferior right hepatic duct was accessed with a 21 gauge needle more centrally. A 0.018 guidewire was eventually advanced into the common bile duct. Four [place] Accustick dilator set was advanced. Outer dilator was advanced to the common bile duct. Contrast injection confirms position. Amplatz guidewire advanced and crossed into the duodenum. Ten [place] tract dilatation performed. Eventually, a 32 [place] internal external biliary drain was able to be advanced over the Amplatz guidewire with the retention loop formed in the duodenum. Peripheral radiopaque marker within the right hepatic duct. Contrast injection confirms adequate drainage of the right and left hepatic ducts. Catheter secured with a Prolene retention suture and connected to external  gravity drainage. Sterile dressing applied. No immediate complication. Patient tolerated the procedure well. Bile sample sent for Gram stain and culture. IMPRESSION: Difficult but successful ultrasound and fluoroscopically guided right 10 French internal external biliary drain insertion. Electronically Signed   By: Jerilynn Mages.  Shick M.D.   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 The patient's case was discussed in multidisciplinary GI conference and it was felt to represent a pancreatic cancer originating from this location. 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 After multidisciplinary discussion, it was felt to be appropriate to proceed with systemic chemotherapy followed by possible stereotactic body radiation treatment. The patient can then be evaluated with further imaging to determine the suitability of proceeding with resection.  I discussed this plan with the patient including the details of stereotactic body radiation treatment. All of his questions were answered. We did review the possible side effects and risks of such a treatment.   PLAN: the patient will proceed with initial systemic chemotherapy followed by possible stereotactic body radiation treatment prior to and anticipated resection depending on further imaging.    ________________________________   Jodelle Gross, MD, PhD   **Disclaimer: This note was dictated with voice recognition software. Similar sounding words can inadvertently be transcribed and this note may contain transcription errors which may not have been corrected upon publication of note.**

## 2015-12-22 ENCOUNTER — Ambulatory Visit (HOSPITAL_COMMUNITY)
Admission: RE | Admit: 2015-12-22 | Discharge: 2015-12-22 | Disposition: A | Discharge status: Home / Self Care | Payer: BLUE CROSS/BLUE SHIELD | Source: Ambulatory Visit | Attending: Gastroenterology | Admitting: Gastroenterology

## 2015-12-22 ENCOUNTER — Other Ambulatory Visit (HOSPITAL_COMMUNITY): Payer: BLUE CROSS/BLUE SHIELD

## 2015-12-22 ENCOUNTER — Telehealth: Payer: Self-pay | Admitting: *Deleted

## 2015-12-22 ENCOUNTER — Encounter (HOSPITAL_COMMUNITY): Payer: Self-pay

## 2015-12-22 DIAGNOSIS — I739 Peripheral vascular disease, unspecified: Secondary | ICD-10-CM | POA: Diagnosis not present

## 2015-12-22 DIAGNOSIS — E119 Type 2 diabetes mellitus without complications: Secondary | ICD-10-CM | POA: Insufficient documentation

## 2015-12-22 DIAGNOSIS — Z794 Long term (current) use of insulin: Secondary | ICD-10-CM | POA: Insufficient documentation

## 2015-12-22 DIAGNOSIS — Z79899 Other long term (current) drug therapy: Secondary | ICD-10-CM | POA: Diagnosis not present

## 2015-12-22 DIAGNOSIS — K831 Obstruction of bile duct: Secondary | ICD-10-CM | POA: Insufficient documentation

## 2015-12-22 DIAGNOSIS — E785 Hyperlipidemia, unspecified: Secondary | ICD-10-CM | POA: Insufficient documentation

## 2015-12-22 DIAGNOSIS — C250 Malignant neoplasm of head of pancreas: Secondary | ICD-10-CM | POA: Insufficient documentation

## 2015-12-22 DIAGNOSIS — I5042 Chronic combined systolic (congestive) and diastolic (congestive) heart failure: Secondary | ICD-10-CM | POA: Insufficient documentation

## 2015-12-22 DIAGNOSIS — I429 Cardiomyopathy, unspecified: Secondary | ICD-10-CM | POA: Diagnosis not present

## 2015-12-22 DIAGNOSIS — C259 Malignant neoplasm of pancreas, unspecified: Secondary | ICD-10-CM

## 2015-12-22 HISTORY — DX: Malignant (primary) neoplasm, unspecified: C80.1

## 2015-12-22 HISTORY — PX: IR GENERIC HISTORICAL: IMG1180011

## 2015-12-22 LAB — COMPREHENSIVE METABOLIC PANEL
ALBUMIN: 3.1 g/dL — AB (ref 3.5–5.0)
ALT: 23 U/L (ref 17–63)
ANION GAP: 10 (ref 5–15)
AST: 19 U/L (ref 15–41)
Alkaline Phosphatase: 165 U/L — ABNORMAL HIGH (ref 38–126)
BUN: 11 mg/dL (ref 6–20)
CHLORIDE: 99 mmol/L — AB (ref 101–111)
CO2: 28 mmol/L (ref 22–32)
Calcium: 8.9 mg/dL (ref 8.9–10.3)
Creatinine, Ser: 0.76 mg/dL (ref 0.61–1.24)
GFR calc Af Amer: >60 mL/min (ref >60)
Glucose, Bld: 189 mg/dL — ABNORMAL HIGH (ref 65–99)
POTASSIUM: 3.6 mmol/L (ref 3.5–5.1)
Sodium: 137 mmol/L (ref 135–145)
Total Bilirubin: 2.1 mg/dL — ABNORMAL HIGH (ref 0.3–1.2)
Total Protein: 7.1 g/dL (ref 6.5–8.1)

## 2015-12-22 LAB — CBC WITH DIFFERENTIAL/PLATELET
BASOS ABS: 0.1 10*3/uL (ref 0.0–0.1)
Basophils Relative: 1 %
Eosinophils Absolute: 0.5 10*3/uL (ref 0.0–0.7)
Eosinophils Relative: 5 %
HCT: 31.9 % — ABNORMAL LOW (ref 39.0–52.0)
Hemoglobin: 10.8 g/dL — ABNORMAL LOW (ref 13.0–17.0)
Lymphocytes Relative: 34 %
Lymphs Abs: 3.2 10*3/uL (ref 0.7–4.0)
MCH: 27.6 pg (ref 26.0–34.0)
MCHC: 33.9 g/dL (ref 30.0–36.0)
MCV: 81.4 fL (ref 78.0–100.0)
MONO ABS: 1 10*3/uL (ref 0.1–1.0)
Monocytes Relative: 10 %
Neutro Abs: 4.7 10*3/uL (ref 1.7–7.7)
Neutrophils Relative %: 50 %
Platelets: 334 10*3/uL (ref 150–400)
RBC: 3.92 MIL/uL — ABNORMAL LOW (ref 4.22–5.81)
RDW: 13.7 % (ref 11.5–15.5)
WBC: 9.4 10*3/uL (ref 4.0–10.5)

## 2015-12-22 LAB — GLUCOSE, CAPILLARY: GLUCOSE-CAPILLARY: 188 mg/dL — AB (ref 65–99)

## 2015-12-22 LAB — PROTIME-INR
INR: 1.07
PROTHROMBIN TIME: 13.9 s (ref 11.4–15.2)

## 2015-12-22 MED ORDER — PIPERACILLIN-TAZOBACTAM 3.375 G IVPB
3.3750 g | Freq: Once | INTRAVENOUS | Status: AC
  Administered 2015-12-22: 3.375 g via INTRAVENOUS
  Filled 2015-12-22: qty 50

## 2015-12-22 MED ORDER — KETOROLAC TROMETHAMINE 30 MG/ML IJ SOLN
INTRAMUSCULAR | Status: AC
  Filled 2015-12-22: qty 1

## 2015-12-22 MED ORDER — SODIUM CHLORIDE 0.9 % IV SOLN
INTRAVENOUS | Status: DC
  Administered 2015-12-22: 10:00:00 via INTRAVENOUS

## 2015-12-22 MED ORDER — IOPAMIDOL (ISOVUE-300) INJECTION 61%
50.0000 mL | Freq: Once | INTRAVENOUS | Status: AC | PRN
  Administered 2015-12-22: 40 mL

## 2015-12-22 MED ORDER — MIDAZOLAM HCL 2 MG/2ML IJ SOLN
INTRAMUSCULAR | Status: AC | PRN
  Administered 2015-12-22 (×3): 1 mg via INTRAVENOUS

## 2015-12-22 MED ORDER — KETOROLAC TROMETHAMINE 30 MG/ML IJ SOLN
INTRAMUSCULAR | Status: AC | PRN
  Administered 2015-12-22: 30 mg via INTRAVENOUS

## 2015-12-22 MED ORDER — FENTANYL CITRATE (PF) 100 MCG/2ML IJ SOLN
INTRAMUSCULAR | Status: AC | PRN
  Administered 2015-12-22 (×2): 50 ug via INTRAVENOUS

## 2015-12-22 MED ORDER — LIDOCAINE HCL 1 % IJ SOLN
INTRAMUSCULAR | Status: AC
  Filled 2015-12-22: qty 20

## 2015-12-22 MED ORDER — FENTANYL CITRATE (PF) 100 MCG/2ML IJ SOLN
INTRAMUSCULAR | Status: AC
  Filled 2015-12-22: qty 4

## 2015-12-22 MED ORDER — MIDAZOLAM HCL 2 MG/2ML IJ SOLN
INTRAMUSCULAR | Status: AC
  Filled 2015-12-22: qty 6

## 2015-12-22 MED ORDER — TRAMADOL HCL 50 MG PO TABS
50.0000 mg | ORAL_TABLET | ORAL | Status: AC
  Administered 2015-12-22: 50 mg via ORAL
  Filled 2015-12-22: qty 1

## 2015-12-22 NOTE — Progress Notes (Signed)
Patient ID: Donald Steele, male   DOB: 08-17-1960, 55 y.o.   MRN: [id number]    Referring Physician(s): Hung,Patrick  Supervising Physician: Corrie Mckusick  Patient Status:  Outpatient  Chief Complaint:  Pancreatic cancer, biliary obstruction  Subjective: Patient familiar to IR service from prior internal/external biliary drain placement on 12/03/15. He has a history of pancreatic adenocarcinoma/biliary obstruction as well as duodenal stenting and presents again today for follow-up cholangiogram and possible biliary stent placement. He is currently asymptomatic with exception of occ cough/mild biliary drain site soreness and his biliary drain is capped. Additional medical history as below. Past Medical History:  Diagnosis Date  . 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 Cardiomyopathy ([place])    a. Echo 2/17 - EF 35-40%, mod diff HK, inf-septal, inf-lat AK, Gr 1 DD, mild MR  . Carotid stenosis    a. Carotid US 2/17 - bilat ICA 1-39%  . Chronic combined systolic and diastolic CHF (congestive heart failure) ([place])   . Diabetes mellitus   . History of nuclear stress test    a. Nuc study 2/17 - Myocardial perfusion is abnormal. Findings consistent with prior myocardial infarction (inf, inf-lat, apical inf, apical lateral) with peri-infarct ischemia in apical inferolateral and apical lateral regions. This is a high risk study. Overall left ventricular systolic function was abnormal. LV cavity size is severely enlarged. Nuclear stress EF: 29%.   . Hyperlipidemia   . Peripheral vascular disease due to secondary diabetes mellitus ([place])    a. s/p R Fem-Tibial bypass 2/17   Past Surgical History:  Procedure Laterality Date  . CARDIAC CATHETERIZATION N/A 06/30/2015   Procedure: Left Heart Cath and Coronary Angiography;  Surgeon: Jettie Booze, MD;  Location: [facility];  Service: Cardiovascular;  Laterality: N/A;  . DUODENAL STENT PLACEMENT N/A 12/06/2015   Procedure:  DUODENAL STENT PLACEMENT;  Surgeon: Carol Ada, MD;  [facility]: [facility];  Service: Endoscopy;  Laterality: N/A;  . ENDARTERECTOMY FEMORAL Right 06/02/2015   Procedure: ENDARTERECTOMY RIGHT PROFUNDA FEMORIS ARTERY;  Surgeon: Mal Misty, MD;  Location: [facility];  Service: Vascular;  Laterality: Right;  . ERCP N/A 12/02/2015   Procedure: ENDOSCOPIC RETROGRADE CHOLANGIOPANCREATOGRAPHY (ERCP);  Surgeon: Carol Ada, MD;  Location: [facility];  Service: Endoscopy;  Laterality: N/A;  . ESOPHAGOGASTRODUODENOSCOPY (EGD) WITH PROPOFOL N/A 12/06/2015   Procedure: ESOPHAGOGASTRODUODENOSCOPY (EGD) WITH PROPOFOL;  Surgeon: Carol Ada, MD;  [facility];  Service: Endoscopy;  Laterality: N/A;  . EUS N/A 12/06/2015   Procedure: UPPER ENDOSCOPIC ULTRASOUND (EUS) LINEAR;  Surgeon: Carol Ada, MD;  [facility];  Service: Endoscopy;  Laterality: N/A;  . FEMORAL-TIBIAL BYPASS GRAFT Right 06/02/2015   Procedure: RIGHT PROFUNDA FEMORIS TO POSTERIOR TIBIAL ARTERY BYPASS USING RIGHT NON-REVERSED GREATER SAPHENOUS VEIN,  SUBCUTANEOUS TUNNEL;  Surgeon: Mal Misty, MD;  Location: [facility];  Service: Vascular;  Laterality: Right;  . INTRAOPERATIVE ARTERIOGRAM Right 06/02/2015   Procedure: INTRA OPERATIVE ARTERIOGRAM RIGHT LOWER LEG;  Surgeon: Mal Misty, MD;  Location: [facility];  Service: Vascular;  Laterality: Right;  . IR GENERIC HISTORICAL  12/03/2015   IR INT EXT BILIARY DRAIN WITH CHOLANGIOGRAM 12/03/2015 [name], MD [facility]  . No history of Surgery     . PERIPHERAL VASCULAR CATHETERIZATION N/A 06/01/2015   Procedure: Abdominal Aortogram;  Surgeon: Serafina [place], MD;  Location: [facility];  Service: Cardiovascular;  Laterality: N/A;  . PERIPHERAL VASCULAR CATHETERIZATION N/A 07/07/2015   Procedure: Aortic Arch Angiography;  Surgeon: Conrad Boswell, MD;  Location: [facility];  Service: Cardiovascular;  Laterality: N/A;  . PERIPHERAL VASCULAR CATHETERIZATION N/A  07/07/2015   Procedure: Upper Extremity Angiography;  Surgeon: Conrad Monroe, MD;  Location: [facility];  Service: Cardiovascular;  Laterality: N/A;  . PERIPHERAL VASCULAR CATHETERIZATION Left 07/07/2015   Procedure: Peripheral Vascular Intervention;  Surgeon: Conrad [place] Ayr, MD;  Location: [facility];  Service: Cardiovascular;  Laterality: Left;  left subclavian and axillary  . VEIN HARVEST Right 06/02/2015   Procedure: VEIN HARVEST RIGHT GREATER SAPHENOUS VEIN;  Surgeon: Mal Misty, MD;  Location: [facility];  Service: Vascular;  Laterality: Right;     Allergies: Review of patient's allergies indicates no known allergies.  Medications: Prior to Admission medications   Medication Sig Start Date End Date Taking? Authorizing Provider  aspirin 81 MG EC tablet Take 1 tablet (81 mg total) by mouth daily. Swallow whole. 07/15/15  Yes Camelia Eng Tysinger, PA-C  atorvastatin (LIPITOR) 40 MG tablet Take 1 tablet (40 mg total) by mouth daily. 07/14/15  Yes Liliane Shi, PA-C  clopidogrel (PLAVIX) 75 MG tablet Take 1 tablet (75 mg total) by mouth daily. 07/15/15  Yes Camelia Eng Tysinger, PA-C  collagenase (SANTYL) ointment Apply topically daily. Apply to right foot wound daily. Place moist saline gauze on top and wrap foot daily. 06/07/15  Yes Alvia Grove, PA-C  doxycycline (VIBRA-TABS) 100 MG tablet Take 1 tablet (100 mg total) by mouth 2 (two) times daily. 12/15/15  Yes Camelia Eng Tysinger, PA-C  furosemide (LASIX) 20 MG tablet Take 1 tablet (20 mg total) by mouth daily. 07/15/15  Yes Camelia Eng Tysinger, PA-C  Insulin Degludec (TRESIBA FLEXTOUCH [place]) Inject 15 Units into the skin at bedtime.   Yes Historical Provider, MD  losartan (COZAAR) 25 MG tablet Take 1 tablet (25 mg total) by mouth daily. 07/15/15  Yes Camelia Eng Tysinger, PA-C  metoprolol succinate (TOPROL XL) 25 MG 24 hr tablet Take 1 tablet (25 mg total) by mouth daily. 07/15/15  Yes Camelia Eng Tysinger, PA-C  pregabalin (LYRICA) 200 MG capsule Take  1 capsule (200 mg total) by mouth 2 (two) times daily. 11/15/15  Yes Camelia Eng Tysinger, PA-C  insulin glargine (LANTUS) 100 UNIT/ML injection Inject 0.14 mLs (14 Units total) into the skin at bedtime. 12/09/15   Debbe Odea, MD  NEEDLE, DISP, 30 G (BD DISP NEEDLES) 30G X 1/2" MISC 1 each by Does not apply route at bedtime. Patient not taking: Reported on 12/14/2015 11/17/15   Camelia Eng Tysinger, PA-C  nitroGLYCERIN (NITROSTAT) 0.4 MG SL tablet Place 1 tablet (0.4 mg total) under the tongue every 5 (five) minutes as needed for chest pain. Patient not taking: Reported on 12/14/2015 06/17/15   Liliane Shi, PA-C  Pancrelipase, Lip-Prot-Amyl, (CREON) 24000 units CPEP Take 2 capsules (48,000 Units total) by mouth 3 (three) times daily with meals. 12/20/15   Ladell Pier, MD  traMADol (ULTRAM) 50 MG tablet Take 1 tablet (50 mg total) by mouth every 12 (twelve) hours as needed for moderate pain or severe pain. Patient not taking: Reported on 12/14/2015 11/15/15   Camelia Eng Tysinger, PA-C     Vital Signs: BP 99/60   Pulse 77   Temp 98.2 F (36.8 C) (Oral)   Resp 18   SpO2 100%   Physical Exam patient awake, alert. Chest with slightly diminished breath sounds left base, right clear. Heart with regular rate and rhythm. Abdomen  soft, positive bowel sounds, right upper quadrant biliary drain intact/capped, insertion site mildly tender, no apparent leakage. Lower extremities without significant edema; wound noted on left anterior tibial region  Imaging: No results found.  Labs:  CBC:  Recent Labs  12/04/15 0422 12/05/15 0449 12/06/15 1201 12/08/15 0359  WBC 9.4 9.3 8.5 7.6  HGB 11.4* 12.1* 11.4* 10.8*  HCT 34.7* 37.5* 36.5* 33.3*  PLT 332 368 390 380    COAGS:  Recent Labs  06/01/15 2015 06/02/15 0300 06/24/15 1024 06/24/15 1858 12/02/15 0409  INR 1.02 1.11 1.04 1.07 1.18  APTT 32  --   --  29  --     BMP:  Recent Labs  12/05/15 0449 12/06/15 1201 12/08/15 0359 12/08/15 1450  12/14/15 1158  NA 140 138 139 135 131*  K 3.6 3.7 3.2* 4.0 4.6  CL 104 102 103 101 95*  CO2 27 26 29 27 26   GLUCOSE 92 92 274* 317* 397*  BUN 10 16 15 13 11   CALCIUM 9.4 9.4 8.7* 8.6* 9.4  CREATININE 0.54* 0.66 0.80 0.79 0.67*  GFRNONAA >60 >60 >60 >60  --   GFRAA >60 >60 >60 >60  --     LIVER FUNCTION TESTS:  Recent Labs  12/06/15 1201 12/08/15 0359 12/08/15 1450 12/14/15 1158  BILITOT 5.5* 4.0* 4.1* 3.2*  AST 62* 47* 50* 21  ALT 100* 84* 89* 39  ALKPHOS 514* 370* 405* 246*  PROT 7.3 6.3* 6.8 6.5  ALBUMIN 3.1* 2.8* 3.2* 3.4*    Assessment and Plan: Patient with history of recently diagnosed pancreatic cancer with biliary obstruction , status post prior duodenal stenting as well as internal/external biliary drain on 12/03/15. He presents today for follow-up cholangiogram with possible biliary stent placement. Details/risks of procedure, including but not limited to, internal bleeding, infection, inability to place stent and /or need to maintain current biliary drain discussed with patient and family with their understanding and consent.Labs pending.Last dose of plavix was yesterday.   Electronically Signed: D. Rowe Robert 12/22/2015, 9:05 AM   I spent a total of 20 minutes at the the patient's bedside AND on the patient's hospital floor or unit, greater than 50% of which was counseling/coordinating care for cholangiogram with possible stent placement

## 2015-12-22 NOTE — Progress Notes (Signed)
Spoke with Dr.Wagoner about pt's pain level, 7/10 rt flank.  Pt still groggy, face grimaces but pt still sleepy.  Dr. Jacqualyn Posey also states to go over this with K.Allred, PA also.  Rowe Robert states ok to give tramadol but allow pt to wake up more, then give the med.

## 2015-12-22 NOTE — Procedures (Signed)
Interventional Radiology Procedure Note  Procedure: Through the tube cholangiogram.  Placement of 12mm x 44mm self-expanding metallic fully-covered biliary stent across malignant occlusion of common bile duct. Post dilation to 68mm.  Removal of drain.   Complications: None Recommendations:  - Observe x 1 hour - Routine dressing changes at the skin surface.  - Do not submerge for 7 days - Routine care   Signed,  Dulcy Fanny. Earleen Newport, DO

## 2015-12-22 NOTE — Discharge Instructions (Signed)
Biliary Sten, Care After Refer to this sheet in the next few weeks. These instructions provide you with information on caring for yourself after your procedure. Your health care provider may also give you more specific instructions. Your treatment has been planned according to current medical practices, but problems sometimes occur. Call your health care provider if you have any problems or questions after your procedure. WHAT TO EXPECT AFTER THE PROCEDURE After your procedure, it is typical to have the following:  Pain or soreness at the incision site.  Drowsiness for several hours after the procedure.  Some bruising at the  site.  HOME CARE INSTRUCTIONS   Do not use machinery, drive, or make legal decisions for 24 hours after your procedure.  Have someone drive you home.  Resume your usual diet. Avoid alcoholic beverages for 24 hours after your procedure.  Rest for the remainder of the day.  Only take over-the-counter or prescription medicines for pain, discomfort, or fever as directed by your health care provider. Do not take aspirin or blood thinners unless directed otherwise. This can make bleeding worse.  Take showers, not baths. Avoid pools and hot tubs.   Keep all follow-up appointments. SEEK MEDICAL CARE IF:   Your pain gets worse and is not relieved with pain medicines after an initial improvement.  You have a fever.  You have chills. SEEK IMMEDIATE MEDICAL CARE IF:   If you have redness, soreness, swelling, or drainage like pus at the incision site, seek help right away, these could be signs of infection at the incision site.    This information is not intended to replace advice given to you by your health care provider. Make sure you discuss any questions you have with your health care provider.   Document Released: 11/15/2003 Document Revised: 04/07/2013 Document Reviewed: 12/08/2012 Elsevier Interactive Patient Education 2016 Elsevier Inc. Moderate Conscious  Sedation, Adult, Care After Refer to this sheet in the next few weeks. These instructions provide you with information on caring for yourself after your procedure. Your health care provider may also give you more specific instructions. Your treatment has been planned according to current medical practices, but problems sometimes occur. Call your health care provider if you have any problems or questions after your procedure. WHAT TO EXPECT AFTER THE PROCEDURE  After your procedure:  You may feel sleepy, clumsy, and have poor balance for several hours.  Vomiting may occur if you eat too soon after the procedure. HOME CARE INSTRUCTIONS  Do not participate in any activities where you could become injured for at least 24 hours. Do not:  Drive.  Swim.  Ride a bicycle.  Operate heavy machinery.  Cook.  Use power tools.  Climb ladders.  Work from a high place.  Do not make important decisions or sign legal documents until you are improved.  If you vomit, drink water, juice, or soup when you can drink without vomiting. Make sure you have little or no nausea before eating solid foods.  Only take over-the-counter or prescription medicines for pain, discomfort, or fever as directed by your health care provider.  Make sure you and your family fully understand everything about the medicines given to you, including what side effects may occur.  You should not drink alcohol, take sleeping pills, or take medicines that cause drowsiness for at least 24 hours.  If you smoke, do not smoke without supervision.  If you are feeling better, you may resume normal activities 24 hours after you were sedated.  Keep all appointments with your health care provider. SEEK MEDICAL CARE IF:  Your skin is pale or bluish in color.  You continue to feel nauseous or vomit.  Your pain is getting worse and is not helped by medicine.  You have bleeding or swelling.  You are still sleepy or feeling  clumsy after 24 hours. SEEK IMMEDIATE MEDICAL CARE IF:  You develop a rash.  You have difficulty breathing.  You develop any type of allergic problem.  You have a fever. MAKE SURE YOU:  Understand these instructions.  Will watch your condition.  Will get help right away if you are not doing well or get worse.   This information is not intended to replace advice given to you by your health care provider. Make sure you discuss any questions you have with your health care provider.   Document Released: 01/21/2013 Document Revised: 04/23/2014 Document Reviewed: 01/21/2013 Elsevier Interactive Patient Education [employer].

## 2015-12-22 NOTE — Telephone Encounter (Signed)
Oncology Nurse Navigator Documentation  Oncology Nurse Navigator Flowsheets 12/22/2015  Navigator Location [facility]  Navigator Encounter Type Telephone  Telephone Medication Assistance--PA for Creon  Abnormal Finding Date -  Confirmed Diagnosis Date -  Patient Visit Type -  Treatment Phase -  Barriers/Navigation Needs -  Education -  Interventions Other--called PA service on 9/6 to initiate process and should receive fax from company. Called back on 12/22/15-told that they have the PA in process and should receive the fax by end of business day today. Will make collaborative nurse aware in case fax come in tomorrow [phone])  Education Method -  Support Groups/Services -  Acuity -  Time Spent with Patient 15  Per Dr. Benay Spice : too high risk for DVT for steroid or Megace for appetite. Getting his disease under control will be his best option to improve appetite.

## 2015-12-23 ENCOUNTER — Telehealth: Payer: Self-pay | Admitting: *Deleted

## 2015-12-23 NOTE — Telephone Encounter (Signed)
Call placed to f/u on prior auth regarding medication assistance for Creon. PA service informed this RN that Creon is not covered and would be denied even if prior auth was obtained.  Will inform S. Coward, GI Navigator.

## 2015-12-26 ENCOUNTER — Telehealth: Payer: Self-pay | Admitting: *Deleted

## 2015-12-26 ENCOUNTER — Telehealth: Payer: Self-pay

## 2015-12-26 NOTE — Telephone Encounter (Signed)
Gave  Right number here in chart

## 2015-12-26 NOTE — Telephone Encounter (Signed)
Keshia from home health care called and wanted to speak with someone about pt discharge he has denied the last three visits so she would like for someone to call her

## 2015-12-26 NOTE — Telephone Encounter (Signed)
Had to get right number for [facility]

## 2015-12-26 NOTE — Telephone Encounter (Signed)
Spoke with a gentleman and he states this is the incorrect number. Thanks Wells Guiles

## 2015-12-26 NOTE — Telephone Encounter (Signed)
Attempted call to [facility] with no answer, no VCM

## 2015-12-26 NOTE — Telephone Encounter (Signed)
I need the phone number

## 2015-12-26 NOTE — Telephone Encounter (Signed)
Attempted call to [facility] with home health at [phone]. # states service is temporarily disabled.

## 2015-12-26 NOTE — Telephone Encounter (Signed)
[phone] 

## 2015-12-26 NOTE — Telephone Encounter (Signed)
pls call home health back to inquire.  I couldnt get anyone to answer the phone

## 2015-12-26 NOTE — Telephone Encounter (Signed)
Oncology Nurse Navigator Documentation  Oncology Nurse Navigator Flowsheets 12/26/2015  Navigator Location [facility]  Navigator Encounter Type Telephone  Telephone Incoming Call;Outgoing Call  Abnormal Finding Date -  Confirmed Diagnosis Date -  Patient Visit Type -  Treatment Phase -  Barriers/Navigation Needs Family concerns--not eating per Jacqueline;Education His Creon was denied by insurance-do not cover this drug Abdominal pain and gas with eating  Education Pain/ Symptom Management--try small frequent meals and try taking a Gas-X with food or try OTC Mylicon or Gaviscon. Encouraged him to drink at least #2 Ensure/day and get in 64 ounces of liquid/day. Soft diet due to lack of teeth in back to chew. Explained that he needs protein and calories to heal his wounds and do well with his chemotherapy. Encouraged him to try taking a pain pill after a meal to see if this helps his abdominal pain  Interventions Education Method--gave girlfriend the PAP phone # for [employer] to call to see if he qualifies for free drug from [place] -  Time Spent with Patient 30

## 2015-12-27 ENCOUNTER — Encounter (HOSPITAL_COMMUNITY)
Admission: RE | Admit: 2015-12-27 | Discharge: 2015-12-27 | Disposition: A | Discharge status: Home / Self Care | Payer: BLUE CROSS/BLUE SHIELD | Source: Ambulatory Visit | Attending: General Surgery | Admitting: General Surgery

## 2015-12-27 ENCOUNTER — Encounter (HOSPITAL_COMMUNITY): Payer: Self-pay

## 2015-12-27 ENCOUNTER — Inpatient Hospital Stay (HOSPITAL_COMMUNITY): Admission: RE | Admit: 2015-12-27 | Payer: BLUE CROSS/BLUE SHIELD | Source: Ambulatory Visit

## 2015-12-27 DIAGNOSIS — L97919 Non-pressure chronic ulcer of unspecified part of right lower leg with unspecified severity: Secondary | ICD-10-CM | POA: Diagnosis not present

## 2015-12-27 DIAGNOSIS — K831 Obstruction of bile duct: Secondary | ICD-10-CM | POA: Diagnosis not present

## 2015-12-27 DIAGNOSIS — I255 Ischemic cardiomyopathy: Secondary | ICD-10-CM | POA: Diagnosis not present

## 2015-12-27 DIAGNOSIS — E1151 Type 2 diabetes mellitus with diabetic peripheral angiopathy without gangrene: Secondary | ICD-10-CM | POA: Diagnosis not present

## 2015-12-27 DIAGNOSIS — D630 Anemia in neoplastic disease: Secondary | ICD-10-CM | POA: Diagnosis not present

## 2015-12-27 DIAGNOSIS — I2510 Atherosclerotic heart disease of native coronary artery without angina pectoris: Secondary | ICD-10-CM | POA: Diagnosis not present

## 2015-12-27 DIAGNOSIS — Z794 Long term (current) use of insulin: Secondary | ICD-10-CM | POA: Diagnosis not present

## 2015-12-27 DIAGNOSIS — K8681 Exocrine pancreatic insufficiency: Secondary | ICD-10-CM | POA: Diagnosis not present

## 2015-12-27 DIAGNOSIS — L97929 Non-pressure chronic ulcer of unspecified part of left lower leg with unspecified severity: Secondary | ICD-10-CM | POA: Diagnosis not present

## 2015-12-27 DIAGNOSIS — I509 Heart failure, unspecified: Secondary | ICD-10-CM | POA: Diagnosis not present

## 2015-12-27 DIAGNOSIS — I110 Hypertensive heart disease with heart failure: Secondary | ICD-10-CM | POA: Diagnosis not present

## 2015-12-27 DIAGNOSIS — C250 Malignant neoplasm of head of pancreas: Secondary | ICD-10-CM | POA: Diagnosis not present

## 2015-12-27 DIAGNOSIS — Z87891 Personal history of nicotine dependence: Secondary | ICD-10-CM | POA: Diagnosis not present

## 2015-12-27 DIAGNOSIS — E440 Moderate protein-calorie malnutrition: Secondary | ICD-10-CM | POA: Diagnosis not present

## 2015-12-27 DIAGNOSIS — E11621 Type 2 diabetes mellitus with foot ulcer: Secondary | ICD-10-CM | POA: Diagnosis not present

## 2015-12-27 DIAGNOSIS — E7800 Pure hypercholesterolemia, unspecified: Secondary | ICD-10-CM | POA: Diagnosis not present

## 2015-12-27 HISTORY — DX: Essential (primary) hypertension: I10

## 2015-12-27 HISTORY — DX: Obstruction of duodenum: K31.5

## 2015-12-27 HISTORY — DX: Weakness: R53.1

## 2015-12-27 HISTORY — DX: Atherosclerotic heart disease of native coronary artery without angina pectoris: I25.10

## 2015-12-27 LAB — URINALYSIS, ROUTINE W REFLEX MICROSCOPIC
Bilirubin Urine: NEGATIVE
GLUCOSE, UA: NEGATIVE mg/dL
Hgb urine dipstick: NEGATIVE
Ketones, ur: NEGATIVE mg/dL
LEUKOCYTES UA: NEGATIVE
Nitrite: NEGATIVE
PH: 5.5 (ref 5.0–8.0)
PROTEIN: NEGATIVE mg/dL
Specific Gravity, Urine: 1.015 (ref 1.005–1.030)

## 2015-12-27 LAB — GLUCOSE, CAPILLARY: Glucose-Capillary: 207 mg/dL — ABNORMAL HIGH (ref 65–99)

## 2015-12-27 NOTE — Progress Notes (Addendum)
Anesthesia Chart Review: Patient is a 55 year old male scheduled for insertion of a Port-a-cath on 12/29/15 by Dr. Barry Dienes. DX: Pancreatic cancer.  History includes DM2, HTN, HLD, PVD with right foot ulcer s/p right profunda femoris-PTA bypass 06/02/15, former smoker (quit 05/18/15), generalized weakness, mild (ICA) carotid artery stenosis 0/0174, combined systolic and diastolic CHF, dilated cardiomyopathy, CAD (medical therapy unless symptoms 06/2015). S/P angioplasty and stenting of left SCA 07/07/15.  Admitted 12/01/15-12/09/15 with a pancreatic mass, jaundice, weight loss. 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 S/P common bile duct stent 12/22/15.   - PCP is Chana Bode, PA-C (first established on 05/31/15).  - Oncologist is Dr. Benay Spice. - GI is Dr. Benson Norway. - He was seen by cardiologist Dr. Johnsie Cancel on 06/01/15 for preoperative clearance and cleared without testing (in the setting of somewhat urgent need for vascular surgery for limb salvage). He was followed by cardiology during his post-operative course. Echo was done due to abnormal EKG and showed cardiomyopathy (EF 35-40%). B-blocker was added with plans for out-patient Myoview which was high risk. Cardiac cath was done (see below). Medical therapy preferred unless he started having symptoms. Last office visit was 09/06/15. Patient on ARB and B-blocker at that time. In regards to patient's ischemic cardiomyopathy/CAD/CHF:  1. Ischemic CM - EF ~ 40%.  His BP is soft.  Will continue current dose of beta-blocker, angiotensin receptor blocker.  If BP increases, consider changing Toprol to Coreg and adding Spironolactone at some point.  Consider repeat Echo at some point in the next 90 days as well.   2. CAD - Diffuse small vessel disease by  recent LHC. Decision was made initially for medical Rx.  He was not felt to have significant symptoms to proceed with PCI.  If anginal symptoms progress, PCI of a long diffusely disease RI can be considered.  FFR of the LAD could also be considered.  Currently, not having any anginal symptoms.                         -  Continue ASA, Plavix, beta blocker, statin.                     3. Combined Systolic and Diastolic CHF - Volume appears stable.  He is NYHA 2.  Continue Losartan 25 mg QD, Toprol-XL 25 mg QD.      Meds include ASA 4m, Lipitor, Plavix, doxycycline, Lasix, Tresiba, Lantus, Humalog, losartan, Toprol XL, Nitro, Pancrelipase, Lyrica, tramadol. Per [facility], patient to hold Plavix 5 days prior to procedure.  BP (!) (P) 104/53   Pulse (P) 84   Temp (P) 36.5 C   Resp (P) 18   Ht (P) 5' 11"  (1.803 m)   Wt (P) 164 lb 8 oz (74.6 kg)   SpO2 (P) 100%   BMI (P) 22.94 kg/m   07/14/15 EKG: NSR, inferior infarct (age undetermined), ST/T wave abnormality, consider lateral and inferior ischemia. No changes when compared to previous tracing.  06/03/15 Echo: Study Conclusions - Left ventricle: The cavity size was normal. Systolic function was   moderately reduced. The estimated ejection fraction was in the   range of 35% to 40%. Moderate diffuse hypokinesis. There is   akinesis of the basalinferoseptal myocardium. There is akinesis   of the basal-mid myocardium. There  is akinesis of the   entireinferolateral myocardium. There was an increased relative   contribution of atrial contraction to ventricular filling.   Doppler parameters are consistent with abnormal left ventricular   relaxation (grade 1 diastolic dysfunction). - Aortic valve: Trileaflet; mildly thickened, mildly calcified   leaflets. - Mitral valve: There was mild regurgitation. - Tricuspid valve: There was trivial regurgitation.  06/15/15 Nuclear stress test:  Nuclear stress EF: 29%.  There was no ST segment deviation noted  during stress.  Defect 1: There is a large defect of severe severity present in the basal inferior, basal inferolateral, mid inferior, mid inferolateral, apical inferior and apical lateral location. Mostly infarct with mild peri-infarct ischemia in the apical inferolateral and apical lateral regions.  Defect 2: There is a small defect of moderate severity present in the apical septal location. There is no reversible ischemia in this region.  Findings consistent with prior myocardial infarction with peri-infarct ischemia.  This is a high risk study.  The left ventricular ejection fraction is severely decreased (<30%).  06/30/15 Cardiac cath (Dr. Irish Lack):  Mid LAD lesion, 50-60% stenosed.  Dist LAD lesion, 50% stenosed.  Ramus lesion, 75% stenosed.  RPDA lesion, 99% stenosed.  There is moderate left ventricular systolic dysfunction.  Patent proximal left subclavian. There appears to be a subtotal occlusion at the bend.  Moderate LV dysfunction with normal LVEDP. LVEF 35-45% by visual estimate. There are wall motion abnormalities in the LV. There are segmental wall motion abnormalities in the LV. Generally small, diffusely diseased coronary vessels. No high risk lesions at this time. Given his lack of symptoms, would favor medical therapy. Would allow him to have his PV procedure performed. Hopefully, his foot ulcer and his left hand ulcer will heal. If he develops angina, could bring him back for treatment of the ramus which is a long, diffusely diseased vessel. Could also pressure wire the mid LAD.  06/01/15 Carotid U/S: Summary: - The vertebral arteries appear patent with antegrade flow. The   left vertebral artery waveform is diminished. - Findings consistent with 1-39 percent stenosis involving the   right internal carotid artery and the left internal carotid   artery. - Findings consistent with stenosis involving the right external   carotid artery and the left external carotid  artery, left   significantly greater than right.  Preoperative labs noted. Cr 0.76, glucose 189, alk phos 165, AST 19, ALT 23, H/H 10.8/31.9. PT/INR WNL. A1c 11/15/15 was 11.9 (He was started on nightly insulin per Tysinger, PA-C).  Discussed cardiac history with anesthesiologist Dr. [place] [facility] [place]. If no acute CV/CHF symptoms the day of surgery then would anticipate that he could proceed as planned with PAC insertion. He will get a fasting CBG on arrival.  [facility] Short Stay Center/Anesthesiology Phone [phone] 12/27/2015 5:08 PM  Addendum: In communication with Dr. Barry Dienes, she wrote that she has communicated with Dr. Johnsie Cancel who thought patient would be "high risk and would not recommend big surgery (whipple for him). Ok for port."  [facility] Short Stay Center/Anesthesiology Phone [phone] 12/28/2015 11:18 AM

## 2015-12-27 NOTE — Progress Notes (Signed)
Per Raquel Sarna at Dr. Marlowe Aschoff office, patient was instructed to stop Plavix but can continue taking Aspirin 81 mg.  Patient's fiance, states that last dose of Plavix was 12/24/15.

## 2015-12-27 NOTE — Pre-Procedure Instructions (Signed)
Donald Steele  12/27/2015      [id number] [address], [place] [address] Phone: [phone] Fax: [phone]    Your procedure is scheduled on Thursday, September 14th, 2017.  Report to [facility] Admitting at 8:00 A.M.   Call this number if you have problems the morning of surgery:  [phone]   Remember:  Do not eat food or drink liquids after midnight.   Take these medicines the morning of surgery with A SIP OF WATER: Metoprolol Succinate (Toprol XL), Pregabalin (Lyrica), Tramadol (Ultram) if needed.   Please follow your MD's instructions on when to stop Plavix and Aspirin.   Stop taking: Naproxen, Aleve, Ibuprofen, Advil, Motrin, BC's, Goody's, Fish oil, all herbal medications, and all vitamins.    WHAT DO I DO ABOUT MY DIABETES MEDICATION?  . THE NIGHT BEFORE SURGERY, take 7 units of Tresiba insulin.      . If your CBG is greater than 220 mg/dL, you may take  of your sliding scale (correction) dose of insulin.  If blood sugar is greater than 220 the morning of surgery, you may take 2 of Humalog Insulin.      How to Manage Your Diabetes Before and After Surgery  Why is it important to control my blood sugar before and after surgery? . Improving blood sugar levels before and after surgery helps healing and can limit problems. . A way of improving blood sugar control is eating a healthy diet by: o  Eating less sugar and carbohydrates o  Increasing activity/exercise o  Talking with your doctor about reaching your blood sugar goals . High blood sugars (greater than 180 mg/dL) can raise your risk of infections and slow your recovery, so you will need to focus on controlling your diabetes during the weeks before surgery. . Make sure that the doctor who takes care of your diabetes knows about your planned surgery including the date and location.  How do I manage my blood sugar before  surgery? . Check your blood sugar at least 4 times a day, starting 2 days before surgery, to make sure that the level is not too high or low. o Check your blood sugar the morning of your surgery when you wake up and every 2 hours until you get to the Short Stay unit. . If your blood sugar is less than 70 mg/dL, you will need to treat for low blood sugar: o Do not take insulin. o Treat a low blood sugar (less than 70 mg/dL) with  cup of clear juice (cranberry or apple), 4 glucose tablets, OR glucose gel. o Recheck blood sugar in 15 minutes after treatment (to make sure it is greater than 70 mg/dL). If your blood sugar is not greater than 70 mg/dL on recheck, call [phone] for further instructions. . Report your blood sugar to the short stay nurse when you get to Short Stay.  . If you are admitted to the hospital after surgery: o Your blood sugar will be checked by the staff and you will probably be given insulin after surgery (instead of oral diabetes medicines) to make sure you have good blood sugar levels. o The goal for blood sugar control after surgery is 80-180 mg/dL.     Do not wear jewelry, make-up or nail polish.  Do not wear lotions, powders, or perfumes, or deoderant.  Men may shave face and neck.  Do not bring valuables to the  hospital.  [facility] is not responsible for any belongings or valuables.  Contacts, dentures or bridgework may not be worn into surgery.  Leave your suitcase in the car.  After surgery it may be brought to your room.  For patients admitted to the hospital, discharge time will be determined by your treatment team.  Patients discharged the day of surgery will not be allowed to drive home.   Special instructions:  Preparing for Surgery.   Please read over the following fact sheets that you were given.   [facility]- Preparing For Surgery  Before surgery, you can play an important role. Because skin is not sterile, your skin needs to be as free of  germs as possible. You can reduce the number of germs on your skin by washing with CHG (chlorahexidine gluconate) Soap before surgery.  CHG is an antiseptic cleaner which kills germs and bonds with the skin to continue killing germs even after washing.  Please do not use if you have an allergy to CHG or antibacterial soaps. If your skin becomes reddened/irritated stop using the CHG.  Do not shave (including legs and underarms) for at least 48 hours prior to first CHG shower. It is OK to shave your face.  Please follow these instructions carefully.   1. Shower the NIGHT BEFORE SURGERY and the MORNING OF SURGERY with CHG.   2. If you chose to wash your hair, wash your hair first as usual with your normal shampoo.  3. After you shampoo, rinse your hair and body thoroughly to remove the shampoo.  4. Use CHG as you would any other liquid soap. You can apply CHG directly to the skin and wash gently with a scrungie or a clean washcloth.   5. Apply the CHG Soap to your body ONLY FROM THE NECK DOWN.  Do not use on open wounds or open sores. Avoid contact with your eyes, ears, mouth and genitals (private parts). Wash genitals (private parts) with your normal soap.  6. Wash thoroughly, paying special attention to the area where your surgery will be performed.  7. Thoroughly rinse your body with warm water from the neck down.  8. DO NOT shower/wash with your normal soap after using and rinsing off the CHG Soap.  9. Pat yourself dry with a CLEAN TOWEL.   10. Wear CLEAN PAJAMAS   11. Place CLEAN SHEETS on your bed the night of your first shower and DO NOT SLEEP WITH PETS.  Day of Surgery: Do not apply any deodorants/lotions. Please wear clean clothes to the hospital/surgery center.

## 2015-12-27 NOTE — Progress Notes (Signed)
PCP - Dr. Chana Bode Cardiologist - pt. Denies seeing a cardiologist   EKG - 07/14/15 CXR - 12/06/15  Echo- 05/2015 Stress test - 06/2015 Cardiac Cath - 06/2015  Patient denies chest pain and shortness of breath at PAT appointment.  Patient accompanied to PAT appointment with significant other who assisted in giving health history and review medications.  Both patient and significant were poor historians and appeared to be unaware of diagnosis that were listed for patient.    Patient states that he checks his blood sugars 2-3 times daily and that his fasting glucose is now in the 180's but was previously in the 300's.  He states that he is doing a better job with his medications and his diet.    Significant other informed nurse that Plavix was stopped on 12/24/15 per Dr. Marlowe Aschoff office.  With patient present, nurse contacted Dr. Marlowe Aschoff office and spoke with Raquel Sarna who stated that patient was to continue taking Aspirin.  Patient and significant other verbalized understanding.

## 2015-12-27 NOTE — H&P (Signed)
Donald Steele 12/16/2015 7:55 AM Location: [facility] Patient #: [id number] DOB: August 05, 1960 Undefined / Language: Donald Steele / Race: Black or African American Male   History of Present Illness Donald Klein MD; 12/16/2015 12:59 PM) The patient is a 55 year old male who presents with pancreatic cancer. Pt is seen as consultation at the request of Dr. Benay Steele. Patient is a 55 year old male who is admitted to the hospital between August 17 and August 25 with a diagnosis of obstructive jaundice. He has significant history for ischemic cardiomyopathy and peripheral vascular disease. Over the last 6 months he has had weight loss 60 pounds, diarrhea, and difficulty eating. He also has intermittent abdominal pain.  On the hospital, he underwent workup and staging. The findings are below but basically he had a 3.9 cm pancreatic head mass that was obstructing the duodenum partially. ERCP was not successful based on the duodenal component in the mass and he required internal/external drainage in radiology. He also had a duodenal stent placed. He was noted to have worsening of his diabetes. He does take insulin. He has some nonhealing lower extremity wounds that he has been working on. His bilirubin has come down to 3.2 from 13.9. His hemoglobin and hematocrit has been stable. His blood sugars have been mostly in the 200s and 300s. Chest CT is negative for metastatic disease. Of note, while in house his biliary drain fluid was sent for cytology and was noted to have yeast present on pathology.  Images and reports are reviewed. Pt also discussed in multidisciplinary fashion.   CA 19-9 10  EGD Endoscopic Finding Findings: The examined esophagus was endoscopically normal. The entire examined stomach was endoscopically normal. An acquired extrinsic severe stenosis was found in the duodenal bulb and was traversed. This was stented with a 22 mm x 60 mm enteral Wallstent and a 22 mm x 90 mm enteral  Wallstent. One non-bleeding cratered duodenal ulcer with no stigmata of bleeding was found in the first portion of the duodenum. The lesion was 10 mm in largest dimension. Biopsies were taken with a cold forceps for histology. Duodenal stent was placed.   EUS A round mass was identified in the pancreatic head. The mass was hypoechoic. The mass measured 36 mm by 31 mm in maximal cross-sectional diameter. The outer margins were irregular. There was sonographic evidence suggesting invasion into the portal vein (manifested by interface loss greater than or equal to 15 mm). An intact interface was seen between the mass and the superior mesenteric artery and celiac trunk suggesting a lack of invasion. The remainder of the pancreas was examined. The endosonographic appearance of parenchyma and the upstream pancreatic duct indicated parenchymal atrophy and 4 mm ductal dilation as well as side branch duct dilatio. Fine needle aspiration for cytology was performed. Color Doppler imaging was utilized prior to needle puncture to confirm a lack of significant vascular structures within the needle path. Five passes were made with the 25 gauge needle using a transduodenal approach. A stylet was used. A cytologist was present and performed a preliminary cytologic examination. The cellularity of the specimen was adequate. Final cytology results are pending.  pathology Diagnosis Duodenum, Biopsy, mass - POSITIVE FOR ADENOCARCINOMA.  Cytology Diagnosis FINE NEEDLE ASPIRATION, ENDOSCOPIC, PANCREAS, HEAD (SPECIMEN 1 OF 1 COLLECTED 12/06/15): MALIGNANT CELLS CONSISTENT WITH ADENOCARCINOMA.  Dg Abd 1 View  Result Date: 12/06/2015 CLINICAL DATA: Duodenal stent placement EXAM: ABDOMEN - 1 VIEW COMPARISON: Fluoroscopic series of December 05, 2015 FINDINGS: A duodenal stent is  in place in the expected location of the descending duodenum. A percutaneous biliary drainage tube is in place and appears to be in  stable position. There is barium within the colon and rectum. No small or large bowel obstructive pattern is observed. IMPRESSION: No evidence of bowel obstruction. No complication observed following biliary stent placement. Electronically Signed By: Donald Steele M.D. On: 12/06/2015 17:22  Ct Chest W Contrast  Result Date: 11/30/2015 CLINICAL DATA: Initial evaluation for weight loss, elevated liver function tests, jaundice. EXAM: CT CHEST, ABDOMEN, AND PELVIS WITH CONTRAST TECHNIQUE: Multidetector CT imaging of the chest, abdomen and pelvis was performed following the standard protocol during bolus administration of intravenous contrast. CONTRAST: 145mL ISOVUE-300 IOPAMIDOL (ISOVUE-300) INJECTION 61% COMPARISON: None available. FINDINGS: CT CHEST FINDINGS Few scattered subcentimeter hypodense nodules partially visualize within the thyroid gland, of doubtful clinical significance. Visualized thyroid otherwise unremarkable. No pathologically enlarged mediastinal, hilar, or axillary lymph nodes identified. Intrathoracic aorta of normal caliber without acute abnormality. Scattered calcified plaque within the aortic arch in at the origin of the great vessels. Vascular stent in place at the proximal left subclavian artery. Flow within the stent appears to be patent. No high-grade stenosis at the origin of the great vessels. Heart size within normal limits. Prominent coronary artery calcifications noted, most evident within the LAD. No pericardial effusion. Limited evaluation the pulmonary arteries grossly unremarkable. The lungs are clear without focal infiltrate, pulmonary edema, or pleural effusion. No pneumothorax. Single 4 mm nodular density noted within the superior segment of the right lower lobe (series 4, image 63), indeterminate. Additional 2 mm nodular density along the right major fissure (series 4, image 53), most consistent with a small intra pulmonic lymph node. No other worrisome pulmonary  nodule or mass identified. No acute osseous abnormality. No worrisome lytic or blastic osseous lesions. CT ABDOMEN PELVIS FINDINGS Few scattered cystic lucencies noted within the right hepatic lobe near the gallbladder fossa, largest of which measures 1.5 x 1.7 cm (series 2, image 55). These are indeterminate, and may in reflect dilated biliary ducts. Liver otherwise unremarkable with no other focal intrahepatic lesions identified. Gallbladder somewhat hydropic in appearance without acute inflammatory changes. Common bile duct mildly dilated at 8 mm. Mild intrahepatic biliary dilatation at the hilum of the liver. No cholelithiasis or choledocholithiasis identified. Spleen and adrenal glands within normal limits. There is a somewhat ill-defined hypodense mass centered at the pancreatic head which measures approximately 3.1 x 3.9 x 2.9 cm, highly concerning for primary pancreatic neoplasm. Pancreas at this level is somewhat ill-defined which shaggy margins. There is dilatation of the pancreatic duct distally up to 4 mm. Remainder the pancreatic body and tail are somewhat atrophic in appearance. Mass closely approximates the gastric antrum/pyloric region anteriorly as well as the duodenum laterally. Fat about the celiac axis and SMA is well preserved without frank vascular encasement. No significant peripancreatic adenopathy. Kidneys are equal in size with symmetric enhancement. No nephrolithiasis, hydronephrosis, or focal enhancing renal mass. Stomach within normal limits. No evidence for bowel obstruction. No abnormal wall thickening, mucosal enhancement, or inflammatory fat stranding seen about the bowels. Appendix well visualized in the right lower quadrant and is normal caliber and appearance without associated inflammatory changes suggest acute appendicitis. Moderate amount of retained stool within the colon. Partially distended bladder within normal limits without acute abnormality. Prostate within normal  limits. No free intraperitoneal air. No free fluid. No pathologically enlarged intra-abdominal or pelvic lymph nodes. Postsurgical changes noted within the right groin. Advanced aorto bi-iliac  atherosclerotic disease. No aneurysm. No acute osseous abnormality. No worrisome lytic or blastic osseous lesions. IMPRESSION: 1. Poorly defined hypodense mass at the pancreatic head, measuring approximately 3.1 x 3.9 x 2.9 cm, highly concerning for primary pancreatic neoplasm. Associated mild pancreatic and biliary ductal dilatation as above. 2. Hypodense lesions at the central aspect of the liver, somewhat indeterminate, and may reflect focally dilated biliary ducts. Possible metastases not entirely excluded. 3. Single small 4 mm nodular density within the superior segment of the right lower lobe, indeterminate. Attention at follow-up recommended. 4. Prominent coronary artery and aorto bi-iliac atherosclerotic disease. Electronically Signed By: Jeannine Boga M.D. On: 11/30/2015 17:02  Ct Abdomen Pelvis W Contrast  Result Date: 11/30/2015 CLINICAL DATA: Initial evaluation for weight loss, elevated liver function tests, jaundice. EXAM: CT CHEST, ABDOMEN, AND PELVIS WITH CONTRAST TECHNIQUE: Multidetector CT imaging of the chest, abdomen and pelvis was performed following the standard protocol during bolus administration of intravenous contrast. CONTRAST: 159mL ISOVUE-300 IOPAMIDOL (ISOVUE-300) INJECTION 61% COMPARISON: None available. FINDINGS: CT CHEST FINDINGS Few scattered subcentimeter hypodense nodules partially visualize within the thyroid gland, of doubtful clinical significance. Visualized thyroid otherwise unremarkable. No pathologically enlarged mediastinal, hilar, or axillary lymph nodes identified. Intrathoracic aorta of normal caliber without acute abnormality. Scattered calcified plaque within the aortic arch in at the origin of the great vessels. Vascular stent in place at the proximal left  subclavian artery. Flow within the stent appears to be patent. No high-grade stenosis at the origin of the great vessels. Heart size within normal limits. Prominent coronary artery calcifications noted, most evident within the LAD. No pericardial effusion. Limited evaluation the pulmonary arteries grossly unremarkable. The lungs are clear without focal infiltrate, pulmonary edema, or pleural effusion. No pneumothorax. Single 4 mm nodular density noted within the superior segment of the right lower lobe (series 4, image 63), indeterminate. Additional 2 mm nodular density along the right major fissure (series 4, image 53), most consistent with a small intra pulmonic lymph node. No other worrisome pulmonary nodule or mass identified. No acute osseous abnormality. No worrisome lytic or blastic osseous lesions. CT ABDOMEN PELVIS FINDINGS Few scattered cystic lucencies noted within the right hepatic lobe near the gallbladder fossa, largest of which measures 1.5 x 1.7 cm (series 2, image 55). These are indeterminate, and may in reflect dilated biliary ducts. Liver otherwise unremarkable with no other focal intrahepatic lesions identified. Gallbladder somewhat hydropic in appearance without acute inflammatory changes. Common bile duct mildly dilated at 8 mm. Mild intrahepatic biliary dilatation at the hilum of the liver. No cholelithiasis or choledocholithiasis identified. Spleen and adrenal glands within normal limits. There is a somewhat ill-defined hypodense mass centered at the pancreatic head which measures approximately 3.1 x 3.9 x 2.9 cm, highly concerning for primary pancreatic neoplasm. Pancreas at this level is somewhat ill-defined which shaggy margins. There is dilatation of the pancreatic duct distally up to 4 mm. Remainder the pancreatic body and tail are somewhat atrophic in appearance. Mass closely approximates the gastric antrum/pyloric region anteriorly as well as the duodenum laterally. Fat about the  celiac axis and SMA is well preserved without frank vascular encasement. No significant peripancreatic adenopathy. Kidneys are equal in size with symmetric enhancement. No nephrolithiasis, hydronephrosis, or focal enhancing renal mass. Stomach within normal limits. No evidence for bowel obstruction. No abnormal wall thickening, mucosal enhancement, or inflammatory fat stranding seen about the bowels. Appendix well visualized in the right lower quadrant and is normal caliber and appearance without associated inflammatory changes suggest acute  appendicitis. Moderate amount of retained stool within the colon. Partially distended bladder within normal limits without acute abnormality. Prostate within normal limits. No free intraperitoneal air. No free fluid. No pathologically enlarged intra-abdominal or pelvic lymph nodes. Postsurgical changes noted within the right groin. Advanced aorto bi-iliac atherosclerotic disease. No aneurysm. No acute osseous abnormality. No worrisome lytic or blastic osseous lesions. IMPRESSION: 1. Poorly defined hypodense mass at the pancreatic head, measuring approximately 3.1 x 3.9 x 2.9 cm, highly concerning for primary pancreatic neoplasm. Associated mild pancreatic and biliary ductal dilatation as above. 2. Hypodense lesions at the central aspect of the liver, somewhat indeterminate, and may reflect focally dilated biliary ducts. Possible metastases not entirely excluded. 3. Single small 4 mm nodular density within the superior segment of the right lower lobe, indeterminate. Attention at follow-up recommended. 4. Prominent coronary artery and aorto bi-iliac atherosclerotic disease. Electronically Signed By: Jeannine Boga M.D. On: 11/30/2015 17:02   Dg Ugi W/kub  Result Date: 12/05/2015 CLINICAL DATA: Pancreas cancer. Evaluate for obstruction. EXAM: UPPER GI SERIES WITH KUB TECHNIQUE: After obtaining a scout radiograph a routine upper GI series was performed using thin  barium FLUOROSCOPY TIME: Radiation Exposure Index (as provided by the fluoroscopic device): 107.4 mGy COMPARISON: None. FINDINGS: On the scout radiograph there is a percutaneous transhepatic biliary drainage catheter overlying the right upper quadrant of the abdomen. Enteric contrast material from recent CT of the abdomen and pelvis is identified throughout the colon up to the level of the rectum. The patient ingested approximately 8 ounces of thin barium. When the patient was placed in the right lateral orientation there was prompt emptying of the stomach into the duodenum. There is a focal area of marked circumferential luminal narrowing involving the proximal portion of the descending duodenum. Here, the lumen of the duodenum is narrowed to approximately 4 mm. IMPRESSION: 1. Focal area of marked luminal narrowing involving the proximal portion of the descending duodenum secondary to pancreas mass. Here, the lumen of the duodenum measures approximately 4 mm. Electronically Signed By: Kerby Moors M.D. On: 12/05/2015 12:04   Ir Int Lianne Cure Biliary Drain With Cholangiogram  Result Date: 12/03/2015 INDICATION: Obstructive jaundice, central pancreatic mass, unsuccessfully ERCP stent insertion. EXAM: IR INT-EXT BILIARY DRAIN W/ CHOLANGIOGRAM MEDICATIONS: 3.375 g Zosyn; The antibiotic was administered within an appropriate time frame prior to the initiation of the procedure. ANESTHESIA/SEDATION: Moderate (conscious) sedation was employed during this procedure. A total of Versed 6 mg and Fentanyl 300 mcg was administered intravenously. Moderate Sedation Time: 73 minutes. The patient's level of consciousness and vital signs were monitored continuously by radiology nursing throughout the procedure under my direct supervision. FLUOROSCOPY TIME: Fluoroscopy Time: 30 minutes 36 seconds (614 mGy). COMPLICATIONS: None immediate. PROCEDURE: Informed written consent was obtained from the patient after a thorough  discussion of the procedural risks, benefits and alternatives. All questions were addressed. Maximal Sterile Barrier Technique was utilized including caps, mask, sterile gowns, sterile gloves, sterile drape, hand hygiene and skin antiseptic. A timeout was performed prior to the initiation of the procedure. Under sterile conditions and local anesthesia, initially percutaneous ultrasound access performed of the right hepatic lobe in the mid axillary line with a 21 gauge needle. Several passes were made. Eventually a peripheral right hepatic bile duct was accessed for cholangiogram. Cholangiogram performed. This demonstrates minor intrahepatic biliary dilatation. Extrahepatic common bile duct is dilated with obstruction distally. Several additional 21 gauge needle passes were made through the liver with ultrasound and fluoroscopy. Eventually, an inferior right hepatic duct  was accessed with a 21 gauge needle more centrally. A 0.018 guidewire was eventually advanced into the common bile duct. Four [place] Accustick dilator set was advanced. Outer dilator was advanced to the common bile duct. Contrast injection confirms position. Amplatz guidewire advanced and crossed into the duodenum. Ten [place] tract dilatation performed. Eventually, a 70 [place] internal external biliary drain was able to be advanced over the Amplatz guidewire with the retention loop formed in the duodenum. Peripheral radiopaque marker within the right hepatic duct. Contrast injection confirms adequate drainage of the right and left hepatic ducts. Catheter secured with a Prolene retention suture and connected to external gravity drainage. Sterile dressing applied. No immediate complication. Patient tolerated the procedure well. Bile sample sent for Gram stain and culture. IMPRESSION: Difficult but successful ultrasound and fluoroscopically guided right 10 French internal external biliary drain insertion. Electronically Signed By: Jerilynn Mages. Shick M.D. On:  12/03/2015 17:07  CBC Status: Abnormal Collection Time: 12/05/15 4:49 AM Result Value Ref Range WBC 9.3 4.0 - 10.5 K/uL RBC 4.35 4.22 - 5.81 MIL/uL Hemoglobin 12.1 (L) 13.0 - 17.0 g/dL HCT 37.5 (L) 39.0 - 52.0 % MCV 86.2 78.0 - 100.0 fL MCH 27.8 26.0 - 34.0 pg MCHC 32.3 30.0 - 36.0 g/dL RDW 15.0 11.5 - 15.5 % Platelets 368 150 - 400 K/uL Comprehensive metabolic panel Status: Abnormal Collection Time: 12/05/15 4:49 AM Result Value Ref Range Sodium 140 135 - 145 mmol/L Potassium 3.6 3.5 - 5.1 mmol/L Chloride 104 101 - 111 mmol/L CO2 27 22 - 32 mmol/L Glucose, Bld 92 65 - 99 mg/dL BUN 10 6 - 20 mg/dL Creatinine, Ser 0.54 (L) 0.61 - 1.24 mg/dL Calcium 9.4 8.9 - 10.3 mg/dL Total Protein 6.7 6.5 - 8.1 g/dL Albumin 3.1 (L) 3.5 - 5.0 g/dL AST 52 (H) 15 - 41 U/L ALT 105 (H) 17 - 63 U/L Alkaline Phosphatase 542 (H) 38 - 126 U/L Total Bilirubin 6.3 (H) 0.3 - 1.2 mg/dL GFR calc non Af Amer >60 >60 mL/min GFR calc Af Amer >60 >60 mL/min Comment:    Other Problems Donald Klein, MD; 12/16/2015 12:42 PM) Diabetes Mellitus Hypercholesterolemia Pancreatic Cancer  Past Surgical History Donald Klein, MD; 12/16/2015 12:42 PM) No pertinent past surgical history  Diagnostic Studies History Donald Klein, MD; 12/16/2015 12:42 PM) Colonoscopy never  Medication History Conni Slipper, RN; 12/16/2015 7:55 AM) Medications Reconciled  Social History Donald Klein, MD; 12/16/2015 12:42 PM) Caffeine use Carbonated beverages, Coffee, Tea. No alcohol use No drug use Tobacco use Never smoker.  Family History Donald Klein, MD; 12/16/2015 12:42 PM) Family history unknown First Degree Relatives    Review of Systems Donald Klein MD; 12/16/2015 12:42 PM) General Present- Appetite Loss, Fatigue and Weight Loss. Not Present- Chills, Fever, Night Sweats and Weight Gain. Skin Not Present- Change in Wart/Mole, Dryness, Hives, Jaundice, New Lesions, Non-Healing Wounds, Rash and  Ulcer. HEENT Present- Wears glasses/contact lenses. Not Present- Earache, Hearing Loss, Hoarseness, Nose Bleed, Oral Ulcers, Ringing in the Ears, Seasonal Allergies, Sinus Pain, Sore Throat, Visual Disturbances and Yellow Eyes. Respiratory Present- Chronic Cough. Not Present- Bloody sputum, Difficulty Breathing, Snoring and Wheezing. Breast Not Present- Breast Mass, Breast Pain, Nipple Discharge and Skin Changes. Cardiovascular Not Present- Chest Pain, Difficulty Breathing Lying Down, Leg Cramps, Palpitations, Rapid Heart Rate, Shortness of Breath and Swelling of Extremities. Gastrointestinal Present- Constipation and Nausea. Not Present- Abdominal Pain, Bloating, Bloody Stool, Change in Bowel Habits, Chronic diarrhea, Difficulty Swallowing, Excessive gas, Gets full quickly at meals, Hemorrhoids, Indigestion, Rectal Pain and Vomiting.  Male Genitourinary Not Present- Blood in Urine, Change in Urinary Stream, Frequency, Impotence, Nocturia, Painful Urination, Urgency and Urine Leakage. Neurological Not Present- Decreased Memory, Fainting, Headaches, Numbness, Seizures, Tingling, Tremor, Trouble walking and Weakness. Psychiatric Not Present- Anxiety, Bipolar, Change in Sleep Pattern, Depression, Fearful and Frequent crying. Endocrine Not Present- Cold Intolerance, Excessive Hunger, Hair Changes, Heat Intolerance, Hot flashes and New Diabetes. Hematology Not Present- Blood Thinners, Easy Bruising, Excessive bleeding, Gland problems, HIV and Persistent Infections.  Vitals Donald Klein MD; 12/16/2015 12:44 PM) 12/16/2015 12:43 PM Weight: 167.9 lb Height: 71in Body Surface Area: 1.96 m Body Mass Index: 23.42 kg/m  Temp.: 98.5F  Pulse: 97 (Regular)  Resp.: 16 (Unlabored)  P.OX: 100% (Room air) BP: 85/50 (Sitting, Left Arm, Standard)       Physical Exam Donald Klein MD; 12/16/2015 12:45 PM) General Mental Status-Alert. General Appearance-Consistent with stated  age. Hydration-Well hydrated. Voice-Normal. Note: wearing what appears to be dirty clothes.   Integumentary Note: bilateral foot ulcers.   Head and Neck Head-normocephalic, atraumatic with no lesions or palpable masses. Trachea-midline. Thyroid Gland Characteristics - normal size and consistency.  Eye Eyeball - Bilateral-Extraocular movements intact. Sclera/Conjunctiva - Bilateral-No scleral icterus.  Chest and Lung Exam Chest and lung exam reveals -quiet, even and easy respiratory effort with no use of accessory muscles and on auscultation, normal breath sounds, no adventitious sounds and normal vocal resonance. Inspection Chest Wall - Normal. Back - normal.  Cardiovascular Cardiovascular examination reveals -normal heart sounds, regular rate and rhythm with no murmurs and normal pedal pulses bilaterally.  Abdomen Inspection Inspection of the abdomen reveals - No Hernias. Palpation/Percussion Palpation and Percussion of the abdomen reveal - Soft, Non Tender, No Rebound tenderness, No Rigidity (guarding) and No hepatosplenomegaly. Auscultation Auscultation of the abdomen reveals - Bowel sounds normal.  Neurologic Neurologic evaluation reveals -alert and oriented x 3 with no impairment of recent or remote memory. Mental Status-Normal.  Musculoskeletal Global Assessment -Note: no gross deformities.  Normal Exam - Left-Upper Extremity Strength Normal and Lower Extremity Strength Normal. Normal Exam - Right-Upper Extremity Strength Normal and Lower Extremity Strength Normal.    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 The patient has lost significant weight and currently has a moderate functional status. His having significant issues with his blood sugar control and has several lower  extremity ulcers. He has gained 2 pounds back is discharged from the hospital.  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 I think he is likely not to be a good surgical candidate. I am communicating with his cardiologist to determine their risk assessment for possible Whipple. He will start with chemotherapy and then he will undergo repeat imaging.  The duodenal invasion is certainly concerning for this being a more aggressive disease.  We discussed Port-A-Cath placement. I reviewed the risks of the procedure including pneumothorax, malfunction, clot, vascular difficulties, possible need for revision. We will do this in the next few weeks.  I will see him back in several months for reassessment as long as no progression. Current Plans Started Creon 24000UNIT, 2 (two) Unit three times daily, #180, 12/16/2015, Ref. x6. 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 I advised patient  to try to keep his sugar below 200. His is going to work with his physician to do this. 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 He is given a prescription co-pay card and a prescription was sent to the pharmacy. ISCHEMIC CARDIOMYOPATHY (I25.5) Impression: Communication sent to cardiology in order for risk assessment for the possibility of a Whipple down the line after neoadjuvant treatment. PERIPHERAL VASCULAR DISEASE IN DIABETES MELLITUS (E11.51) Impression: The patient will need to do some wound healing prior to starting chemotherapy. Inflow has been maximized by vascular surgery. 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 Hopefully  this can improve his nutritional status.    Signed by Donald Klein, MD (12/16/2015 12:59 PM)

## 2015-12-28 ENCOUNTER — Encounter (HOSPITAL_COMMUNITY): Payer: Self-pay

## 2015-12-29 ENCOUNTER — Encounter (HOSPITAL_COMMUNITY): Payer: Self-pay | Admitting: Urology

## 2015-12-29 ENCOUNTER — Ambulatory Visit (HOSPITAL_COMMUNITY): Payer: BLUE CROSS/BLUE SHIELD

## 2015-12-29 ENCOUNTER — Encounter (HOSPITAL_COMMUNITY)
Admission: RE | Disposition: A | Discharge status: Home / Self Care | Payer: Self-pay | Source: Ambulatory Visit | Attending: General Surgery

## 2015-12-29 ENCOUNTER — Ambulatory Visit (HOSPITAL_COMMUNITY): Payer: BLUE CROSS/BLUE SHIELD | Admitting: Vascular Surgery

## 2015-12-29 ENCOUNTER — Ambulatory Visit (HOSPITAL_COMMUNITY): Payer: BLUE CROSS/BLUE SHIELD | Admitting: Anesthesiology

## 2015-12-29 ENCOUNTER — Ambulatory Visit (HOSPITAL_COMMUNITY)
Admission: RE | Admit: 2015-12-29 | Discharge: 2015-12-29 | Disposition: A | Discharge status: Home / Self Care | Payer: BLUE CROSS/BLUE SHIELD | Source: Ambulatory Visit | Attending: General Surgery | Admitting: General Surgery

## 2015-12-29 DIAGNOSIS — C250 Malignant neoplasm of head of pancreas: Secondary | ICD-10-CM | POA: Diagnosis not present

## 2015-12-29 DIAGNOSIS — I110 Hypertensive heart disease with heart failure: Secondary | ICD-10-CM | POA: Insufficient documentation

## 2015-12-29 DIAGNOSIS — I255 Ischemic cardiomyopathy: Secondary | ICD-10-CM | POA: Insufficient documentation

## 2015-12-29 DIAGNOSIS — C801 Malignant (primary) neoplasm, unspecified: Secondary | ICD-10-CM

## 2015-12-29 DIAGNOSIS — I2510 Atherosclerotic heart disease of native coronary artery without angina pectoris: Secondary | ICD-10-CM | POA: Insufficient documentation

## 2015-12-29 DIAGNOSIS — Z794 Long term (current) use of insulin: Secondary | ICD-10-CM | POA: Insufficient documentation

## 2015-12-29 DIAGNOSIS — E7800 Pure hypercholesterolemia, unspecified: Secondary | ICD-10-CM | POA: Insufficient documentation

## 2015-12-29 DIAGNOSIS — Z87891 Personal history of nicotine dependence: Secondary | ICD-10-CM | POA: Insufficient documentation

## 2015-12-29 DIAGNOSIS — L97919 Non-pressure chronic ulcer of unspecified part of right lower leg with unspecified severity: Secondary | ICD-10-CM | POA: Insufficient documentation

## 2015-12-29 DIAGNOSIS — Z95828 Presence of other vascular implants and grafts: Secondary | ICD-10-CM

## 2015-12-29 DIAGNOSIS — E11621 Type 2 diabetes mellitus with foot ulcer: Secondary | ICD-10-CM | POA: Insufficient documentation

## 2015-12-29 DIAGNOSIS — D630 Anemia in neoplastic disease: Secondary | ICD-10-CM | POA: Insufficient documentation

## 2015-12-29 DIAGNOSIS — K831 Obstruction of bile duct: Secondary | ICD-10-CM | POA: Insufficient documentation

## 2015-12-29 DIAGNOSIS — K8681 Exocrine pancreatic insufficiency: Secondary | ICD-10-CM | POA: Insufficient documentation

## 2015-12-29 DIAGNOSIS — E1151 Type 2 diabetes mellitus with diabetic peripheral angiopathy without gangrene: Secondary | ICD-10-CM | POA: Insufficient documentation

## 2015-12-29 DIAGNOSIS — I509 Heart failure, unspecified: Secondary | ICD-10-CM | POA: Insufficient documentation

## 2015-12-29 DIAGNOSIS — E440 Moderate protein-calorie malnutrition: Secondary | ICD-10-CM | POA: Insufficient documentation

## 2015-12-29 DIAGNOSIS — L97929 Non-pressure chronic ulcer of unspecified part of left lower leg with unspecified severity: Secondary | ICD-10-CM | POA: Insufficient documentation

## 2015-12-29 HISTORY — PX: PORTACATH PLACEMENT: SHX2246

## 2015-12-29 LAB — GLUCOSE, CAPILLARY
GLUCOSE-CAPILLARY: 149 mg/dL — AB (ref 65–99)
Glucose-Capillary: 140 mg/dL — ABNORMAL HIGH (ref 65–99)

## 2015-12-29 SURGERY — INSERTION, TUNNELED CENTRAL VENOUS DEVICE, WITH PORT
Anesthesia: General | Site: Chest

## 2015-12-29 MED ORDER — FENTANYL CITRATE (PF) 100 MCG/2ML IJ SOLN: 25.0000 ug | INTRAMUSCULAR | Status: DC | PRN

## 2015-12-29 MED ORDER — LACTATED RINGERS IV SOLN
INTRAVENOUS | Status: DC
  Administered 2015-12-29: 09:00:00 via INTRAVENOUS

## 2015-12-29 MED ORDER — OXYCODONE HCL 5 MG PO TABS: 5.0000 mg | ORAL_TABLET | ORAL | Status: DC | PRN

## 2015-12-29 MED ORDER — HEPARIN SOD (PORK) LOCK FLUSH 100 UNIT/ML IV SOLN
INTRAVENOUS | Status: DC | PRN
  Administered 2015-12-29: 500 [IU]

## 2015-12-29 MED ORDER — ACETAMINOPHEN 650 MG RE SUPP
650.0000 mg | RECTAL | Status: DC | PRN
  Filled 2015-12-29: qty 1

## 2015-12-29 MED ORDER — SODIUM CHLORIDE 0.9% FLUSH: 3.0000 mL | INTRAVENOUS | Status: DC | PRN

## 2015-12-29 MED ORDER — ACETAMINOPHEN 325 MG PO TABS
650.0000 mg | ORAL_TABLET | ORAL | Status: DC | PRN
  Filled 2015-12-29: qty 2

## 2015-12-29 MED ORDER — SODIUM CHLORIDE 0.9% FLUSH: 3.0000 mL | Freq: Two times a day (BID) | INTRAVENOUS | Status: DC

## 2015-12-29 MED ORDER — CHLORHEXIDINE GLUCONATE CLOTH 2 % EX PADS: 6.0000 | MEDICATED_PAD | Freq: Once | CUTANEOUS | Status: DC

## 2015-12-29 MED ORDER — 0.9 % SODIUM CHLORIDE (POUR BTL) OPTIME
TOPICAL | Status: DC | PRN
  Administered 2015-12-29: 1000 mL

## 2015-12-29 MED ORDER — ONDANSETRON HCL 4 MG/2ML IJ SOLN
INTRAMUSCULAR | Status: DC | PRN
  Administered 2015-12-29: 4 mg via INTRAVENOUS

## 2015-12-29 MED ORDER — BUPIVACAINE-EPINEPHRINE (PF) 0.25% -1:200000 IJ SOLN
INTRAMUSCULAR | Status: AC
  Filled 2015-12-29: qty 30

## 2015-12-29 MED ORDER — SODIUM CHLORIDE 0.9 % IV SOLN: 250.0000 mL | INTRAVENOUS | Status: DC | PRN

## 2015-12-29 MED ORDER — FENTANYL CITRATE (PF) 100 MCG/2ML IJ SOLN
INTRAMUSCULAR | Status: DC | PRN
  Administered 2015-12-29: 50 ug via INTRAVENOUS

## 2015-12-29 MED ORDER — FENTANYL CITRATE (PF) 100 MCG/2ML IJ SOLN
INTRAMUSCULAR | Status: AC
  Filled 2015-12-29: qty 2

## 2015-12-29 MED ORDER — LIDOCAINE HCL (PF) 1 % IJ SOLN
INTRAMUSCULAR | Status: AC
  Filled 2015-12-29: qty 30

## 2015-12-29 MED ORDER — PHENYLEPHRINE HCL 10 MG/ML IJ SOLN
INTRAVENOUS | Status: DC | PRN
  Administered 2015-12-29: 40 ug/min via INTRAVENOUS

## 2015-12-29 MED ORDER — LIDOCAINE HCL (CARDIAC) 20 MG/ML IV SOLN
INTRAVENOUS | Status: DC | PRN
  Administered 2015-12-29: 100 mg via INTRAVENOUS

## 2015-12-29 MED ORDER — LACTATED RINGERS IV SOLN
INTRAVENOUS | Status: DC | PRN
  Administered 2015-12-29: 10:00:00 via INTRAVENOUS

## 2015-12-29 MED ORDER — PROMETHAZINE HCL 25 MG/ML IJ SOLN: 6.2500 mg | INTRAMUSCULAR | Status: DC | PRN

## 2015-12-29 MED ORDER — OXYCODONE HCL 5 MG PO TABS: 5.0000 mg | ORAL_TABLET | Freq: Once | ORAL | Status: DC | PRN

## 2015-12-29 MED ORDER — HEPARIN SOD (PORK) LOCK FLUSH 100 UNIT/ML IV SOLN
INTRAVENOUS | Status: AC
  Filled 2015-12-29: qty 5

## 2015-12-29 MED ORDER — MIDAZOLAM HCL 2 MG/2ML IJ SOLN
INTRAMUSCULAR | Status: AC
  Filled 2015-12-29: qty 2

## 2015-12-29 MED ORDER — ONDANSETRON HCL 4 MG/2ML IJ SOLN: 4.0000 mg | Freq: Four times a day (QID) | INTRAMUSCULAR | Status: DC | PRN

## 2015-12-29 MED ORDER — PROPOFOL 10 MG/ML IV BOLUS
INTRAVENOUS | Status: AC
  Filled 2015-12-29: qty 20

## 2015-12-29 MED ORDER — MIDAZOLAM HCL 5 MG/5ML IJ SOLN
INTRAMUSCULAR | Status: DC | PRN
  Administered 2015-12-29: 1 mg via INTRAVENOUS

## 2015-12-29 MED ORDER — OXYCODONE HCL 5 MG PO TABS: 5.0000 mg | ORAL_TABLET | Freq: Four times a day (QID) | ORAL | 0 refills | Status: DC | PRN

## 2015-12-29 MED ORDER — LIDOCAINE HCL 1 % IJ SOLN
INTRAMUSCULAR | Status: DC | PRN
  Administered 2015-12-29: 10:00:00

## 2015-12-29 MED ORDER — CEFAZOLIN SODIUM-DEXTROSE 2-4 GM/100ML-% IV SOLN
2.0000 g | INTRAVENOUS | Status: AC
  Administered 2015-12-29: 2 g via INTRAVENOUS
  Filled 2015-12-29: qty 100

## 2015-12-29 MED ORDER — SODIUM CHLORIDE 0.9 % IV SOLN
INTRAVENOUS | Status: DC | PRN
  Administered 2015-12-29: 10:00:00

## 2015-12-29 MED ORDER — PROPOFOL 10 MG/ML IV BOLUS
INTRAVENOUS | Status: DC | PRN
  Administered 2015-12-29: 150 mg via INTRAVENOUS

## 2015-12-29 MED ORDER — OXYCODONE HCL 5 MG/5ML PO SOLN: 5.0000 mg | Freq: Once | ORAL | Status: DC | PRN

## 2015-12-29 MED ORDER — SUCCINYLCHOLINE CHLORIDE 20 MG/ML IJ SOLN
INTRAMUSCULAR | Status: DC | PRN
  Administered 2015-12-29: 100 mg via INTRAVENOUS

## 2015-12-29 SURGICAL SUPPLY — 50 items
BAG DECANTER FOR FLEXI CONT (MISCELLANEOUS) ×3 IMPLANT
BLADE SURG 11 STRL SS (BLADE) ×3 IMPLANT
BLADE SURG 15 STRL LF DISP TIS (BLADE) ×1 IMPLANT
BLADE SURG 15 STRL SS (BLADE) ×2
CANISTER SUCTION 2500CC (MISCELLANEOUS) IMPLANT
CHLORAPREP W/TINT 10.5 ML (MISCELLANEOUS) ×3 IMPLANT
COVER SURGICAL LIGHT HANDLE (MISCELLANEOUS) ×3 IMPLANT
COVER TRANSDUCER ULTRASND GEL (DRAPE) IMPLANT
CRADLE DONUT ADULT HEAD (MISCELLANEOUS) ×3 IMPLANT
DECANTER SPIKE VIAL GLASS SM (MISCELLANEOUS) ×6 IMPLANT
DERMABOND ADVANCED (GAUZE/BANDAGES/DRESSINGS) ×2
DERMABOND ADVANCED .7 DNX12 (GAUZE/BANDAGES/DRESSINGS) ×1 IMPLANT
DRAPE C-ARM 42X72 X-RAY (DRAPES) ×3 IMPLANT
DRAPE CHEST BREAST 15X10 FENES (DRAPES) ×3 IMPLANT
DRAPE UTILITY XL STRL (DRAPES) ×6 IMPLANT
DRAPE WARM FLUID 44X44 (DRAPE) IMPLANT
ELECT COATED BLADE 2.86 ST (ELECTRODE) ×3 IMPLANT
ELECT REM PT RETURN 9FT ADLT (ELECTROSURGICAL) ×3
ELECTRODE REM PT RTRN 9FT ADLT (ELECTROSURGICAL) ×1 IMPLANT
GAUZE SPONGE 4X4 16PLY XRAY LF (GAUZE/BANDAGES/DRESSINGS) ×3 IMPLANT
GEL ULTRASOUND 20GR AQUASONIC (MISCELLANEOUS) IMPLANT
GLOVE BIO SURGEON STRL SZ 6 (GLOVE) ×3 IMPLANT
GLOVE BIOGEL PI IND STRL 6.5 (GLOVE) ×1 IMPLANT
GLOVE BIOGEL PI IND STRL 7.0 (GLOVE) ×1 IMPLANT
GLOVE BIOGEL PI INDICATOR 6.5 (GLOVE) ×2
GLOVE BIOGEL PI INDICATOR 7.0 (GLOVE) ×2
GLOVE SURG SS PI 7.0 STRL IVOR (GLOVE) ×3 IMPLANT
GOWN STRL REUS W/ TWL LRG LVL3 (GOWN DISPOSABLE) ×1 IMPLANT
GOWN STRL REUS W/TWL 2XL LVL3 (GOWN DISPOSABLE) ×3 IMPLANT
GOWN STRL REUS W/TWL LRG LVL3 (GOWN DISPOSABLE) ×2
KIT BASIN OR (CUSTOM PROCEDURE TRAY) ×3 IMPLANT
KIT PORT POWER 8FR ISP CVUE (Catheter) ×3 IMPLANT
KIT ROOM TURNOVER OR (KITS) ×3 IMPLANT
NEEDLE HYPO 25GX1X1/2 BEV (NEEDLE) ×3 IMPLANT
NS IRRIG 1000ML POUR BTL (IV SOLUTION) ×3 IMPLANT
PACK SURGICAL SETUP 50X90 (CUSTOM PROCEDURE TRAY) ×3 IMPLANT
PAD ARMBOARD 7.5X6 YLW CONV (MISCELLANEOUS) ×3 IMPLANT
PENCIL BUTTON HOLSTER BLD 10FT (ELECTRODE) ×3 IMPLANT
SUT MON AB 4-0 PC3 18 (SUTURE) ×3 IMPLANT
SUT PROLENE 2 0 SH DA (SUTURE) ×6 IMPLANT
SUT VIC AB 3-0 SH 27 (SUTURE) ×2
SUT VIC AB 3-0 SH 27X BRD (SUTURE) ×1 IMPLANT
SYR 20ML ECCENTRIC (SYRINGE) ×6 IMPLANT
SYR 5ML LUER SLIP (SYRINGE) ×3 IMPLANT
SYR CONTROL 10ML LL (SYRINGE) ×3 IMPLANT
TOWEL OR 17X24 6PK STRL BLUE (TOWEL DISPOSABLE) ×3 IMPLANT
TOWEL OR 17X26 10 PK STRL BLUE (TOWEL DISPOSABLE) ×3 IMPLANT
TUBE CONNECTING 12'X1/4 (SUCTIONS)
TUBE CONNECTING 12X1/4 (SUCTIONS) IMPLANT
YANKAUER SUCT BULB TIP NO VENT (SUCTIONS) IMPLANT

## 2015-12-29 NOTE — Discharge Instructions (Addendum)
[facility] °Office Phone Number [phone] ° °BREAST BIOPSY/ PARTIAL MASTECTOMY: POST OP INSTRUCTIONS ° °Always review your discharge instruction sheet given to you by the facility where your surgery was performed. ° °IF YOU HAVE DISABILITY OR FAMILY LEAVE FORMS, YOU MUST BRING THEM TO THE OFFICE FOR PROCESSING.  DO NOT GIVE THEM TO YOUR DOCTOR. ° °1. A prescription for pain medication may be given to you upon discharge.  Take your pain medication as prescribed, if needed.  If narcotic pain medicine is not needed, then you may take acetaminophen (Tylenol) or ibuprofen (Advil) as needed. °2. Take your usually prescribed medications unless otherwise directed °3. If you need a refill on your pain medication, please contact your pharmacy.  They will contact our office to request authorization.  Prescriptions will not be filled after 5pm or on week-ends. °4. You should eat very light the first 24 hours after surgery, such as soup, crackers, pudding, etc.  Resume your normal diet the day after surgery. °5. Most patients will experience some swelling and bruising in the breast.  Ice packs and a good support bra will help.  Swelling and bruising can take several days to resolve.  °6. It is common to experience some constipation if taking pain medication after surgery.  Increasing fluid intake and taking a stool softener will usually help or prevent this problem from occurring.  A mild laxative (Milk of Magnesia or Miralax) should be taken according to package directions if there are no bowel movements after 48 hours. °7. Unless discharge instructions indicate otherwise, you may remove your bandages 48 hours after surgery, and you may shower at that time.  You may have steri-strips (small skin tapes) in place directly over the incision.  These strips should be left on the skin for 7-10 days.   Any sutures or staples will be removed at the office during your follow-up visit. °8. ACTIVITIES:  You may resume  regular daily activities (gradually increasing) beginning the next day.  Wearing a good support bra or sports bra (or the breast binder) minimizes pain and swelling.  You may have sexual intercourse when it is comfortable. °a. You may drive when you no longer are taking prescription pain medication, you can comfortably wear a seatbelt, and you can safely maneuver your car and apply brakes. °b. RETURN TO WORK:  __________1 week_______________ °9. You should see your doctor in the office for a follow-up appointment approximately two weeks after your surgery.  Your doctor’s nurse will typically make your follow-up appointment when she calls you with your pathology report.  Expect your pathology report 2-3 business days after your surgery.  You may call to check if you do not hear from us after three days. ° ° °WHEN TO CALL YOUR DOCTOR: °1. Fever over 101.0 °2. Nausea and/or vomiting. °3. Extreme swelling or bruising. °4. Continued bleeding from incision. °5. Increased pain, redness, or drainage from the incision. ° °The clinic staff is available to answer your questions during regular business hours.  Please don’t hesitate to call and ask to speak to one of the nurses for clinical concerns.  If you have a medical emergency, go to the nearest emergency room or call 911.  A surgeon from [facility] is always on call at the hospital. ° °For further questions, please visit [email or web address]  ° °

## 2015-12-29 NOTE — Transfer of Care (Signed)
Immediate Anesthesia Transfer of Care Note  Patient: Donald Steele  Procedure(s) Performed: Procedure(s): INSERTION PORT-A-CATH (N/A)  Patient Location: PACU  Anesthesia Type:General  Level of Consciousness: awake, alert , oriented and sedated  Airway & Oxygen Therapy: Patient Spontanous Breathing and Patient connected to nasal cannula oxygen  Post-op Assessment: Report given to RN, Post -op Vital signs reviewed and stable and Patient moving all extremities X 4  Post vital signs: Reviewed and stable  Last Vitals:  Vitals:   12/29/15 0838 12/29/15 1134  BP: 117/66 (!) 157/76  Pulse: 72 78  Resp: 16 (!) 21  Temp: 37.1 C 36.4 C    Last Pain:  Vitals:   12/29/15 1134  TempSrc:   PainSc: Asleep         Complications: No apparent anesthesia complications

## 2015-12-29 NOTE — Anesthesia Procedure Notes (Signed)
Procedure Name: Intubation Date/Time: 12/29/2015 10:36 AM Performed by: Gaylene Brooks Pre-anesthesia Checklist: Patient identified, Emergency Drugs available, Suction available and Patient being monitored Patient Re-evaluated:Patient Re-evaluated prior to inductionOxygen Delivery Method: Circle System Utilized Preoxygenation: Pre-oxygenation with 100% oxygen Intubation Type: IV induction Ventilation: Mask ventilation without difficulty Laryngoscope Size: Miller and 2 Grade View: Grade I Tube type: Oral Tube size: 7.5 mm Number of attempts: 1 Airway Equipment and Method: Stylet and Oral airway Placement Confirmation: ETT inserted through vocal cords under direct vision,  positive ETCO2 and breath sounds checked- equal and bilateral Secured at: 23 cm Tube secured with: Tape Dental Injury: Teeth and Oropharynx as per pre-operative assessment  Comments: LMA was attempted x2 with LMA 5 (2 different types of LMA). We could not ventilate adequately through either one. Intubation performed with no difficulty.

## 2015-12-29 NOTE — Anesthesia Postprocedure Evaluation (Signed)
Anesthesia Post Note  Patient: Donald Steele  Procedure(s) Performed: Procedure(s) (LRB): INSERTION PORT-A-CATH (N/A)  Patient location during evaluation: PACU Anesthesia Type: General Level of consciousness: awake and alert Pain management: pain level controlled Vital Signs Assessment: post-procedure vital signs reviewed and stable Respiratory status: spontaneous breathing, nonlabored ventilation, respiratory function stable and patient connected to nasal cannula oxygen Cardiovascular status: blood pressure returned to baseline and stable Postop Assessment: no signs of nausea or vomiting Anesthetic complications: no    Last Vitals:  Vitals:   12/29/15 1215 12/29/15 1230  BP: 127/82 (!) 115/54  Pulse: 73 71  Resp: 18 14  Temp:  36.7 C    Last Pain:  Vitals:   12/29/15 1300  TempSrc:   PainSc: 0-No pain                 Catalina Gravel

## 2015-12-29 NOTE — Op Note (Signed)
PREOPERATIVE DIAGNOSIS:  Pancreatic cancer     POSTOPERATIVE DIAGNOSIS:  Same     PROCEDURE: Right subclavian port placement, Bard ClearVue  Power Port, MRI safe, 8-French.      SURGEON:  [name], MD      ANESTHESIA:  General   FINDINGS:  Good venous return, easy flush, and tip of the catheter and   SVC 19 cm.      SPECIMEN:  None.      ESTIMATED BLOOD LOSS:  Minimal.      COMPLICATIONS:  None known.      PROCEDURE:  Pt was identified in the holding area and taken to   the operating room, where patient was placed supine on the operating room   table.  General anesthesia was induced.  Patient's arms were tucked and the upper   chest and neck were prepped and draped in sterile fashion.  Time-out was   performed according to the surgical safety check list.  When all was   correct, we continued.   Local anesthetic was administered over this   area at the angle of the clavicle.  The vein was accessed with 2 passes of the needle. There was good venous return and the wire passed easily with no ectopy.   Fluoroscopy was used to confirm that the wire was in the vena cava.      The patient was placed back level and the area for the pocket was anethetized   with local anesthetic.  A 3-cm transverse incision was made with a #15   blade.  Cautery was used to divide the subcutaneous tissues down to the   pectoralis muscle.  An [employer] retractor was used to elevate the skin   while a pocket was created on top of the pectoralis fascia.  The port   was placed into the pocket to confirm that it was of adequate size.  The   catheter was preattached to the port.  The port was then secured to the   pectoralis fascia with four 2-0 Prolene sutures.  These were clamped and   not tied down yet.    The catheter was tunneled through to the wire exit   site.  The catheter was placed along the wire to determine what length it should be to be in the SVC.  The catheter was cut at 19 cm.  The tunneler  sheath and dilator were passed over the wire and the dilator and wire were removed.  The catheter was advanced through the tunneler sheath and the tunneler sheath was pulled away.  Care was taken to keep the catheter in the tunneler sheath as this occurred. This was advanced and the tunneler sheath was removed.  There was good venous   return and easy flush of the catheter.  The Prolene sutures were tied   down to the pectoral fascia.  The skin was reapproximated using 3-0   Vicryl interrupted deep dermal sutures.    Fluoroscopy was used to re-confirm good position of the catheter.  The skin   was then closed using 4-0 Monocryl in a subcuticular fashion.  The port was flushed with concentrated heparin flush as well.  The wounds were then cleaned, dried, and dressed with Dermabond.  The patient was awakened from anesthesia and taken to the PACU in stable condition.  Needle, sponge, and instrument counts were correct.               [name], MD

## 2015-12-29 NOTE — Anesthesia Preprocedure Evaluation (Signed)
Anesthesia Evaluation  Patient identified by MRN, date of birth, ID band Patient awake    Reviewed: Allergy & Precautions, NPO status , Patient's Chart, lab work & pertinent test results, reviewed documented beta blocker date and time   Airway Mallampati: II  TM Distance: >3 FB Neck ROM: Full    Dental  (+) Teeth Intact, Dental Advisory Given, Poor Dentition, Missing   Pulmonary former smoker,    Pulmonary exam normal breath sounds clear to auscultation       Cardiovascular hypertension, Pt. on medications and Pt. on home beta blockers + CAD, + Peripheral Vascular Disease (s/p Right fem-tib bypass) and +CHF  Normal cardiovascular exam Rhythm:Regular Rate:Normal  Echo 2/17 - EF 35-40%, mod diff HK, inf-septal, inf-lat AK, Gr 1 DD, mild MR   Neuro/Psych negative neurological ROS  negative psych ROS   GI/Hepatic negative GI ROS, Neg liver ROS, Pancreatic mass   Endo/Other  negative endocrine ROSdiabetes, Type 2, Insulin Dependent  Renal/GU negative Renal ROS     Musculoskeletal negative musculoskeletal ROS (+)   Abdominal   Peds  Hematology  (+) Blood dyscrasia (Plavix stopped 12/24/15), anemia ,   Anesthesia Other Findings Day of surgery medications reviewed with the patient.  Reproductive/Obstetrics                             Anesthesia Physical Anesthesia Plan  ASA: III  Anesthesia Plan: General   Post-op Pain Management:    Induction: Intravenous  Airway Management Planned: LMA  Additional Equipment:   Intra-op Plan:   Post-operative Plan: Extubation in OR  Informed Consent: I have reviewed the patients History and Physical, chart, labs and discussed the procedure including the risks, benefits and alternatives for the proposed anesthesia with the patient or authorized representative who has indicated his/her understanding and acceptance.   Dental advisory given  Plan  Discussed with: CRNA  Anesthesia Plan Comments: (Risks/benefits of general anesthesia discussed with patient including risk of damage to teeth, lips, gum, and tongue, nausea/vomiting, allergic reactions to medications, and the possibility of heart attack, stroke and death.  All patient questions answered.  Patient wishes to proceed.)        Anesthesia Quick Evaluation

## 2015-12-29 NOTE — Interval H&P Note (Signed)
History and Physical Interval Note:  12/29/2015 10:16 AM  [name]  has presented today for surgery, with the diagnosis of PANCREATIC CANCER  The various methods of treatment have been discussed with the patient and family. After consideration of risks, benefits and other options for treatment, the patient has consented to  Procedure(s): INSERTION PORT-A-CATH (N/A) as a surgical intervention .  The patient's history has been reviewed, patient examined, no change in status, stable for surgery.  I have reviewed the patient's chart and labs.  Questions were answered to the patient's satisfaction.     Donald Steele

## 2015-12-30 ENCOUNTER — Encounter (HOSPITAL_COMMUNITY): Payer: Self-pay | Admitting: General Surgery

## 2016-01-02 ENCOUNTER — Other Ambulatory Visit (HOSPITAL_BASED_OUTPATIENT_CLINIC_OR_DEPARTMENT_OTHER): Payer: BLUE CROSS/BLUE SHIELD | Admitting: *Deleted

## 2016-01-02 ENCOUNTER — Other Ambulatory Visit: Payer: Self-pay | Admitting: *Deleted

## 2016-01-02 ENCOUNTER — Telehealth: Payer: Self-pay | Admitting: Oncology

## 2016-01-02 ENCOUNTER — Ambulatory Visit (HOSPITAL_BASED_OUTPATIENT_CLINIC_OR_DEPARTMENT_OTHER): Payer: BLUE CROSS/BLUE SHIELD | Admitting: Oncology

## 2016-01-02 VITALS — BP 82/60 | HR 92 | Temp 98.5°F | Resp 16 | Ht 71.0 in | Wt 160.8 lb

## 2016-01-02 DIAGNOSIS — G893 Neoplasm related pain (acute) (chronic): Secondary | ICD-10-CM

## 2016-01-02 DIAGNOSIS — E119 Type 2 diabetes mellitus without complications: Secondary | ICD-10-CM

## 2016-01-02 DIAGNOSIS — I959 Hypotension, unspecified: Secondary | ICD-10-CM | POA: Diagnosis not present

## 2016-01-02 DIAGNOSIS — C250 Malignant neoplasm of head of pancreas: Secondary | ICD-10-CM

## 2016-01-02 DIAGNOSIS — I739 Peripheral vascular disease, unspecified: Secondary | ICD-10-CM

## 2016-01-02 MED ORDER — OXYCODONE HCL 5 MG PO TABS: 5.0000 mg | ORAL_TABLET | ORAL | 0 refills | Status: DC | PRN

## 2016-01-02 MED ORDER — MORPHINE SULFATE ER 15 MG PO TBCR: 15.0000 mg | EXTENDED_RELEASE_TABLET | Freq: Two times a day (BID) | ORAL | 0 refills | Status: DC

## 2016-01-02 MED ORDER — HEPARIN SOD (PORK) LOCK FLUSH 100 UNIT/ML IV SOLN
500.0000 [IU] | Freq: Once | INTRAVENOUS | Status: AC
  Administered 2016-01-02: 500 [IU] via INTRAVENOUS
  Filled 2016-01-02: qty 5

## 2016-01-02 MED ORDER — SODIUM CHLORIDE 0.9 % IV SOLN
Freq: Once | INTRAVENOUS | Status: AC
  Administered 2016-01-02: 14:00:00 via INTRAVENOUS

## 2016-01-02 MED ORDER — SODIUM CHLORIDE 0.9% FLUSH
10.0000 mL | INTRAVENOUS | Status: AC | PRN
  Administered 2016-01-02: 10 mL via INTRAVENOUS
  Filled 2016-01-02: qty 10

## 2016-01-02 MED ORDER — OXYCODONE-ACETAMINOPHEN 5-325 MG PO TABS
ORAL_TABLET | ORAL | Status: AC
  Filled 2016-01-02: qty 2

## 2016-01-02 MED ORDER — OXYCODONE-ACETAMINOPHEN 5-325 MG PO TABS
2.0000 | ORAL_TABLET | Freq: Once | ORAL | Status: AC
  Administered 2016-01-02: 2 via ORAL

## 2016-01-02 MED ORDER — LIDOCAINE-PRILOCAINE 2.5-2.5 % EX CREA: 1.0000 "application " | TOPICAL_CREAM | CUTANEOUS | 0 refills | Status: AC | PRN

## 2016-01-02 NOTE — Progress Notes (Signed)
START ON PATHWAY REGIMEN - Pancreatic  PANOS60: Nab-Paclitaxel (Abraxane) 125 mg/m2 D1, 8, 15 + Gemcitabine 1,000 mg/m2 D1, 8, 15 q28 Days x 2 Cycles   A cycle is every 28 days:     Nab-Paclitaxel (protein bound) (Abraxane(R)) 125 mg/m2 in NS to a concentration of 5 mg/mL given IV over 30 mins days 1, 8, and 15 followed by Dose Mod: None     Gemcitabine (Gemzar(R)) 1000 mg/m2 in 250 mL NS IV over 30 minutes days 1, 8, and 15. Dose Mod: None Additional Orders: Hold therapy and notify physician if [place] < 1500.  **Always confirm dose/schedule in your pharmacy ordering system**    Patient Characteristics: Adenocarcinoma, Locally Advanced, Anatomically Unresectable, First Line, PS = 0, 1 Current evidence of distant metastases? No AJCC T Stage: 3 AJCC N Stage: 0 AJCC Stage Grouping: IIA AJCC M Stage: 0 Histology: Adenocarcinoma Line of therapy: First Line Would you be surprised if this patient died  in the next year? I would NOT be surprised if this patient died in the next year  Intent of Therapy: Non-Curative / Palliative Intent, Discussed with Patient

## 2016-01-02 NOTE — Progress Notes (Signed)
Donald Steele states that pain is down to a "3" to his abdomen after taking 2 percocet.

## 2016-01-02 NOTE — Addendum Note (Signed)
Addended by: San Morelle on: 01/02/2016 03:33 PM   Modules accepted: Orders

## 2016-01-02 NOTE — Progress Notes (Signed)
Per Dr. Benay Spice, Lasix, Cozaar, Toprol-XL, Creon and Ultram removed from patient's medication list.  New medication list printed and given and reviewed with patient.

## 2016-01-02 NOTE — Addendum Note (Signed)
Addended by: San Morelle on: 01/02/2016 04:06 PM   Modules accepted: Orders, SmartSet

## 2016-01-02 NOTE — Progress Notes (Signed)
Patient on plan of care prior to pathways. 

## 2016-01-02 NOTE — Telephone Encounter (Signed)
Gave relative avs report and appointments for September and October  °

## 2016-01-02 NOTE — Progress Notes (Signed)
  Donald Steele OFFICE PROGRESS NOTE   Diagnosis: Pancreas cancer  INTERVAL HISTORY:   Donald Steele returns as scheduled. He continues to have abdominal pain. Tramadol and 5 mg of oxycodone did not relieve the pain. No diarrhea. He has a poor appetite. He reports the leg and foot ulcers are healing. He is scheduled for an appointment at the wound clinic tomorrow. The biliary drain was internalized on 12/22/2015. He underwent placement of a Port-A-Cath on 12/29/2015.  Objective:  Vital signs in last 24 hours:  Blood pressure (!) 82/60, pulse 92, temperature 98.5 F (36.9 C), temperature source Oral, resp. rate 16, height 5\' 11"  (1.803 m), weight 160 lb 12.8 oz (72.9 kg), SpO2 100 %.    HEENT: Mild scleral icterus, whitecoat over the tongue, mucous membranes are moist Resp: Lungs clear bilaterally Cardio: Regular rate and rhythm GI: No hepatomegaly, no mass, nontender Vascular: Stasis change at the lower leg bilaterally with a bandage at the left lower leg   Portacath/PICC-without erythema  Lab Results:  Lab Results  Component Value Date   WBC 9.4 12/22/2015   HGB 10.8 (L) 12/22/2015   HCT 31.9 (L) 12/22/2015   MCV 81.4 12/22/2015   PLT 334 12/22/2015   NEUTROABS 4.7 12/22/2015     Medications: I have reviewed the patient's current medications.  1. 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 EUS on 12/06/2015 revealed invasion of the portal vein 2. Diabetes  3.   Peripheral vascular disease, status post stenting of the left subclavian artery and peripheral bypass surgery  4.   Cardiomyopathy  5.   Leg and foot ulcer secondary to diabetes and peripheral vascular disease-followed at the wound clinic  6.   Obstructive jaundice secondary to #1, status post placement of an external-internal biliary  drain 12/03/2015, Stent internalized 12/22/2015  7.    Anorexia/weight loss  8.    Port-A-Cath placed 12/29/2015  9.    Abdominal pain secondary to pancreas cancer   Disposition:  Donald Steele has locally advanced pancreas cancer. He is symptomatic with pain and anorexia. He is hypotensive today. We will minister intravenous fluids and discontinue blood pressure medications.  He will attend a chemotherapy teaching class. The plan is to initiate gemcitabine/Abraxane chemotherapy on 01/09/2016 if his performance status has improved. He will return for an office visit on 01/09/2016.  We will adjust the narcotic analgesic regimen today. I discussed the case with Dr. Con Memos. He is okay with the plan to proceed with chemotherapy.  Betsy Coder, MD  01/02/2016  1:08 PM

## 2016-01-03 ENCOUNTER — Other Ambulatory Visit: Payer: BLUE CROSS/BLUE SHIELD

## 2016-01-03 ENCOUNTER — Encounter: Payer: Self-pay | Admitting: *Deleted

## 2016-01-08 ENCOUNTER — Other Ambulatory Visit: Payer: Self-pay | Admitting: Oncology

## 2016-01-09 ENCOUNTER — Other Ambulatory Visit (HOSPITAL_BASED_OUTPATIENT_CLINIC_OR_DEPARTMENT_OTHER): Payer: BLUE CROSS/BLUE SHIELD

## 2016-01-09 ENCOUNTER — Ambulatory Visit: Payer: BLUE CROSS/BLUE SHIELD

## 2016-01-09 ENCOUNTER — Ambulatory Visit (HOSPITAL_BASED_OUTPATIENT_CLINIC_OR_DEPARTMENT_OTHER): Payer: BLUE CROSS/BLUE SHIELD | Admitting: Nurse Practitioner

## 2016-01-09 ENCOUNTER — Telehealth: Payer: Self-pay | Admitting: Oncology

## 2016-01-09 ENCOUNTER — Ambulatory Visit (HOSPITAL_BASED_OUTPATIENT_CLINIC_OR_DEPARTMENT_OTHER): Payer: BLUE CROSS/BLUE SHIELD

## 2016-01-09 VITALS — BP 92/62 | HR 104 | Temp 97.8°F | Resp 18 | Ht 71.0 in | Wt 156.7 lb

## 2016-01-09 DIAGNOSIS — Z5111 Encounter for antineoplastic chemotherapy: Secondary | ICD-10-CM | POA: Diagnosis not present

## 2016-01-09 DIAGNOSIS — E119 Type 2 diabetes mellitus without complications: Secondary | ICD-10-CM | POA: Diagnosis not present

## 2016-01-09 DIAGNOSIS — C250 Malignant neoplasm of head of pancreas: Secondary | ICD-10-CM

## 2016-01-09 DIAGNOSIS — Z95828 Presence of other vascular implants and grafts: Secondary | ICD-10-CM | POA: Insufficient documentation

## 2016-01-09 DIAGNOSIS — G893 Neoplasm related pain (acute) (chronic): Secondary | ICD-10-CM

## 2016-01-09 LAB — CBC WITH DIFFERENTIAL/PLATELET
BASO%: 1 % (ref 0.0–2.0)
Basophils Absolute: 0.1 10*3/uL (ref 0.0–0.1)
EOS%: 3.7 % (ref 0.0–7.0)
Eosinophils Absolute: 0.3 10*3/uL (ref 0.0–0.5)
HCT: 30.7 % — ABNORMAL LOW (ref 38.4–49.9)
HGB: 9.9 g/dL — ABNORMAL LOW (ref 13.0–17.1)
LYMPH%: 34.2 % (ref 14.0–49.0)
MCH: 26.9 pg — ABNORMAL LOW (ref 27.2–33.4)
MCHC: 32.2 g/dL (ref 32.0–36.0)
MCV: 83.4 fL (ref 79.3–98.0)
MONO#: 0.6 10*3/uL (ref 0.1–0.9)
MONO%: 8.5 % (ref 0.0–14.0)
NEUT%: 52.6 % (ref 39.0–75.0)
NEUTROS ABS: 3.9 10*3/uL (ref 1.5–6.5)
Platelets: 505 10*3/uL — ABNORMAL HIGH (ref 140–400)
RBC: 3.68 10*6/uL — AB (ref 4.20–5.82)
RDW: 16.8 % — ABNORMAL HIGH (ref 11.0–14.6)
WBC: 7.5 10*3/uL (ref 4.0–10.3)
lymph#: 2.5 10*3/uL (ref 0.9–3.3)

## 2016-01-09 LAB — COMPREHENSIVE METABOLIC PANEL
ALT: 15 U/L (ref 0–55)
ANION GAP: 12 meq/L — AB (ref 3–11)
AST: 16 U/L (ref 5–34)
Albumin: 2.8 g/dL — ABNORMAL LOW (ref 3.5–5.0)
Alkaline Phosphatase: 116 U/L (ref 40–150)
BILIRUBIN TOTAL: 1.18 mg/dL (ref 0.20–1.20)
BUN: 7.3 mg/dL (ref 7.0–26.0)
CHLORIDE: 106 meq/L (ref 98–109)
CO2: 21 meq/L — AB (ref 22–29)
CREATININE: 0.8 mg/dL (ref 0.7–1.3)
Calcium: 9.3 mg/dL (ref 8.4–10.4)
EGFR: >90 mL/min/{1.73_m2} (ref >90)
GLUCOSE: 117 mg/dL (ref 70–140)
Potassium: 4 mEq/L (ref 3.5–5.1)
SODIUM: 140 meq/L (ref 136–145)
TOTAL PROTEIN: 7 g/dL (ref 6.4–8.3)

## 2016-01-09 MED ORDER — SODIUM CHLORIDE 0.9 % IV SOLN
800.0000 mg/m2 | Freq: Once | INTRAVENOUS | Status: AC
  Administered 2016-01-09: 1520 mg via INTRAVENOUS
  Filled 2016-01-09: qty 39.98

## 2016-01-09 MED ORDER — SODIUM CHLORIDE 0.9 % IV SOLN
Freq: Once | INTRAVENOUS | Status: AC
  Administered 2016-01-09: 14:00:00 via INTRAVENOUS

## 2016-01-09 MED ORDER — PROCHLORPERAZINE MALEATE 10 MG PO TABS
ORAL_TABLET | ORAL | Status: AC
  Filled 2016-01-09: qty 1

## 2016-01-09 MED ORDER — HEPARIN SOD (PORK) LOCK FLUSH 100 UNIT/ML IV SOLN
500.0000 [IU] | Freq: Once | INTRAVENOUS | Status: AC | PRN
  Administered 2016-01-09: 500 [IU]
  Filled 2016-01-09: qty 5

## 2016-01-09 MED ORDER — PACLITAXEL PROTEIN-BOUND CHEMO INJECTION 100 MG
100.0000 mg/m2 | Freq: Once | INTRAVENOUS | Status: AC
  Administered 2016-01-09: 200 mg via INTRAVENOUS
  Filled 2016-01-09: qty 40

## 2016-01-09 MED ORDER — SODIUM CHLORIDE 0.9% FLUSH
10.0000 mL | INTRAVENOUS | Status: DC | PRN
  Administered 2016-01-09: 10 mL
  Filled 2016-01-09: qty 10

## 2016-01-09 MED ORDER — PROCHLORPERAZINE MALEATE 10 MG PO TABS
10.0000 mg | ORAL_TABLET | Freq: Once | ORAL | Status: AC
  Administered 2016-01-09: 10 mg via ORAL

## 2016-01-09 MED ORDER — SODIUM CHLORIDE 0.9 % IJ SOLN
10.0000 mL | INTRAMUSCULAR | Status: DC | PRN
  Administered 2016-01-09: 10 mL via INTRAVENOUS
  Filled 2016-01-09: qty 10

## 2016-01-09 NOTE — Patient Instructions (Signed)
[facility] Discharge Instructions for Patients Receiving Chemotherapy  Today you received the following chemotherapy agents:  Abraxane, Gemzar  To help prevent nausea and vomiting after your treatment, we encourage you to take your nausea medication as prescribed.   If you develop nausea and vomiting that is not controlled by your nausea medication, call the clinic.   BELOW ARE SYMPTOMS THAT SHOULD BE REPORTED IMMEDIATELY:  *FEVER GREATER THAN 100.5 F  *CHILLS WITH OR WITHOUT FEVER  NAUSEA AND VOMITING THAT IS NOT CONTROLLED WITH YOUR NAUSEA MEDICATION  *UNUSUAL SHORTNESS OF BREATH  *UNUSUAL BRUISING OR BLEEDING  TENDERNESS IN MOUTH AND THROAT WITH OR WITHOUT PRESENCE OF ULCERS  *URINARY PROBLEMS  *BOWEL PROBLEMS  UNUSUAL RASH Items with * indicate a potential emergency and should be followed up as soon as possible.  Feel free to call the clinic you have any questions or concerns. The clinic phone number is [phone].  Please show the CHEMO ALERT CARD at check-in to the Emergency Department and triage nurse.   

## 2016-01-09 NOTE — Progress Notes (Addendum)
  [place] OFFICE PROGRESS NOTE   Diagnosis:  Pancreas cancer   INTERVAL HISTORY:   Donald Steele returns as scheduled. He felt better following the IV fluids last week. Appetite remains poor. No significant abdominal pain. He did not start the MS Contin and oxycodone. He states that he does not tolerate pain medication well. He denies fever. No diarrhea. No lightheadedness or dizziness.  Objective:  Vital signs in last 24 hours:  Blood pressure 92/62, pulse (!) 104, temperature 97.8 F (36.6 C), temperature source Oral, resp. rate 18, height 5\' 11"  (1.803 m), weight 156 lb 11.2 oz (71.1 kg), SpO2 100 %.    HEENT: Mild white coating over tongue. Resp: Lungs clear bilaterally. Cardio: Regular rate and rhythm. GI: Abdomen soft and nontender. No hepatomegaly. No mass. Vascular: No leg edema. Bandage at the left lower leg. Port-A-Cath without erythema.  Lab Results:  Lab Results  Component Value Date   WBC 7.5 01/09/2016   HGB 9.9 (L) 01/09/2016   HCT 30.7 (L) 01/09/2016   MCV 83.4 01/09/2016   PLT 505 (H) 01/09/2016   NEUTROABS 3.9 01/09/2016    Imaging:  No results found.  Medications: I have reviewed the patient's current medications.  Assessment/Plan: 1. 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 Initiation of gemcitabine/Abraxane every 2 weeks beginning 01/09/2016 2. Diabetes  3. Peripheral vascular disease, status post stenting of the left subclavian artery and peripheral bypass surgery  4. Cardiomyopathy  5. Leg and foot ulcer secondary to diabetes and peripheral vascular disease-followed at the wound clinic  6. Obstructive jaundice secondary to #1, status post placement of anexternal-internal biliary drain 12/03/2015,  stent internalized 12/22/2015; bilirubin normal 01/09/2016  7. Anorexia/weight loss  8.    Port-A-Cath placed 12/29/2015  9.    Abdominal pain secondary to pancreas cancer   Disposition: Donald Steele appears stable. He continues to have a poor performance status. His bilirubin has normalized.  He is scheduled to begin treatment with gemcitabine/Abraxane today. We again reviewed potential toxicities. Questions were answered. He is agreeable to proceed. The chemotherapy will be given on a 2 week schedule.  He will return for a follow-up visit and cycle 2 in 2 weeks. He will contact the office in the interim with any problems.  He will continue close follow-up at the wound clinic.  Patient seen with Dr. Benay Spice.  Ned Card ANP/GNP-BC   01/09/2016  11:41 AM This was a shared visit with Ned Card. Donald Steele appears stable. The bilirubin has normalized. The plan is to begin gemcitabine/Abraxane today. I discussed the case with Dr. Con Memos last week.  Julieanne Manson, M.D.

## 2016-01-09 NOTE — Telephone Encounter (Signed)
Gave patient avs report and appointments for October  °

## 2016-01-10 ENCOUNTER — Telehealth: Payer: Self-pay | Admitting: *Deleted

## 2016-01-10 DIAGNOSIS — E11621 Type 2 diabetes mellitus with foot ulcer: Secondary | ICD-10-CM | POA: Diagnosis not present

## 2016-01-10 NOTE — Telephone Encounter (Signed)
-----   Message from Ma Hillock, RN sent at 01/09/2016  3:47 PM EDT ----- Regarding: 1st Chemo F/U: Donald Steele Patient received 1st abraxame/gemzar treatment and tolerated it without any complications. Please call patient to follow-up.

## 2016-01-15 ENCOUNTER — Other Ambulatory Visit: Payer: Self-pay | Admitting: Oncology

## 2016-01-16 ENCOUNTER — Telehealth: Payer: Self-pay

## 2016-01-16 ENCOUNTER — Ambulatory Visit: Payer: BLUE CROSS/BLUE SHIELD

## 2016-01-16 NOTE — Telephone Encounter (Signed)
Faxed disability paperwork to [employer]

## 2016-01-17 ENCOUNTER — Encounter (HOSPITAL_BASED_OUTPATIENT_CLINIC_OR_DEPARTMENT_OTHER): Payer: BLUE CROSS/BLUE SHIELD | Attending: Surgery

## 2016-01-17 DIAGNOSIS — E1151 Type 2 diabetes mellitus with diabetic peripheral angiopathy without gangrene: Secondary | ICD-10-CM | POA: Insufficient documentation

## 2016-01-17 DIAGNOSIS — I70248 Atherosclerosis of native arteries of left leg with ulceration of other part of lower left leg: Secondary | ICD-10-CM | POA: Insufficient documentation

## 2016-01-17 DIAGNOSIS — B9620 Unspecified Escherichia coli [E. coli] as the cause of diseases classified elsewhere: Secondary | ICD-10-CM | POA: Insufficient documentation

## 2016-01-17 DIAGNOSIS — I429 Cardiomyopathy, unspecified: Secondary | ICD-10-CM | POA: Insufficient documentation

## 2016-01-17 DIAGNOSIS — I6529 Occlusion and stenosis of unspecified carotid artery: Secondary | ICD-10-CM | POA: Insufficient documentation

## 2016-01-17 DIAGNOSIS — L03116 Cellulitis of left lower limb: Secondary | ICD-10-CM | POA: Insufficient documentation

## 2016-01-17 DIAGNOSIS — E1169 Type 2 diabetes mellitus with other specified complication: Secondary | ICD-10-CM | POA: Insufficient documentation

## 2016-01-17 DIAGNOSIS — E11621 Type 2 diabetes mellitus with foot ulcer: Secondary | ICD-10-CM | POA: Insufficient documentation

## 2016-01-17 DIAGNOSIS — L97822 Non-pressure chronic ulcer of other part of left lower leg with fat layer exposed: Secondary | ICD-10-CM | POA: Insufficient documentation

## 2016-01-17 DIAGNOSIS — C250 Malignant neoplasm of head of pancreas: Secondary | ICD-10-CM | POA: Insufficient documentation

## 2016-01-17 DIAGNOSIS — L97522 Non-pressure chronic ulcer of other part of left foot with fat layer exposed: Secondary | ICD-10-CM | POA: Insufficient documentation

## 2016-01-17 DIAGNOSIS — I509 Heart failure, unspecified: Secondary | ICD-10-CM | POA: Insufficient documentation

## 2016-01-17 DIAGNOSIS — Z79899 Other long term (current) drug therapy: Secondary | ICD-10-CM | POA: Insufficient documentation

## 2016-01-17 DIAGNOSIS — Z95820 Peripheral vascular angioplasty status with implants and grafts: Secondary | ICD-10-CM | POA: Insufficient documentation

## 2016-01-17 DIAGNOSIS — M86372 Chronic multifocal osteomyelitis, left ankle and foot: Secondary | ICD-10-CM | POA: Insufficient documentation

## 2016-01-17 DIAGNOSIS — Z87891 Personal history of nicotine dependence: Secondary | ICD-10-CM | POA: Insufficient documentation

## 2016-01-22 ENCOUNTER — Other Ambulatory Visit: Payer: Self-pay | Admitting: Oncology

## 2016-01-23 ENCOUNTER — Ambulatory Visit (HOSPITAL_BASED_OUTPATIENT_CLINIC_OR_DEPARTMENT_OTHER): Payer: BLUE CROSS/BLUE SHIELD | Admitting: Nurse Practitioner

## 2016-01-23 ENCOUNTER — Ambulatory Visit: Payer: BLUE CROSS/BLUE SHIELD

## 2016-01-23 ENCOUNTER — Other Ambulatory Visit (HOSPITAL_BASED_OUTPATIENT_CLINIC_OR_DEPARTMENT_OTHER): Payer: BLUE CROSS/BLUE SHIELD

## 2016-01-23 ENCOUNTER — Ambulatory Visit: Payer: BLUE CROSS/BLUE SHIELD | Admitting: Nutrition

## 2016-01-23 ENCOUNTER — Telehealth: Payer: Self-pay | Admitting: *Deleted

## 2016-01-23 ENCOUNTER — Telehealth: Payer: Self-pay | Admitting: Nurse Practitioner

## 2016-01-23 ENCOUNTER — Ambulatory Visit (HOSPITAL_BASED_OUTPATIENT_CLINIC_OR_DEPARTMENT_OTHER): Payer: BLUE CROSS/BLUE SHIELD

## 2016-01-23 VITALS — BP 117/72 | HR 92 | Temp 97.8°F | Resp 17 | Ht 71.0 in | Wt 153.5 lb

## 2016-01-23 DIAGNOSIS — C250 Malignant neoplasm of head of pancreas: Secondary | ICD-10-CM

## 2016-01-23 DIAGNOSIS — Z5111 Encounter for antineoplastic chemotherapy: Secondary | ICD-10-CM | POA: Diagnosis not present

## 2016-01-23 DIAGNOSIS — Z95828 Presence of other vascular implants and grafts: Secondary | ICD-10-CM

## 2016-01-23 LAB — CBC WITH DIFFERENTIAL/PLATELET
BASO%: 0.5 % (ref 0.0–2.0)
BASOS ABS: 0 10*3/uL (ref 0.0–0.1)
EOS ABS: 0.5 10*3/uL (ref 0.0–0.5)
EOS%: 5.8 % (ref 0.0–7.0)
HCT: 28.2 % — ABNORMAL LOW (ref 38.4–49.9)
HEMOGLOBIN: 9.2 g/dL — AB (ref 13.0–17.1)
LYMPH#: 2.5 10*3/uL (ref 0.9–3.3)
LYMPH%: 28.7 % (ref 14.0–49.0)
MCH: 26.6 pg — ABNORMAL LOW (ref 27.2–33.4)
MCHC: 32.6 g/dL (ref 32.0–36.0)
MCV: 81.5 fL (ref 79.3–98.0)
MONO#: 1.1 10*3/uL — AB (ref 0.1–0.9)
MONO%: 12.9 % (ref 0.0–14.0)
NEUT#: 4.5 10*3/uL (ref 1.5–6.5)
NEUT%: 52.1 % (ref 39.0–75.0)
NRBC: 0 % (ref 0–0)
PLATELETS: 413 10*3/uL — AB (ref 140–400)
RBC: 3.46 10*6/uL — ABNORMAL LOW (ref 4.20–5.82)
RDW: 15.2 % — AB (ref 11.0–14.6)
WBC: 8.6 10*3/uL (ref 4.0–10.3)

## 2016-01-23 LAB — COMPREHENSIVE METABOLIC PANEL
ALBUMIN: 2.8 g/dL — AB (ref 3.5–5.0)
ALK PHOS: 102 U/L (ref 40–150)
ALT: 10 U/L (ref 0–55)
AST: 8 U/L (ref 5–34)
Anion Gap: 11 mEq/L (ref 3–11)
BILIRUBIN TOTAL: 0.77 mg/dL (ref 0.20–1.20)
BUN: 5.9 mg/dL — ABNORMAL LOW (ref 7.0–26.0)
CO2: 26 mEq/L (ref 22–29)
Calcium: 9.2 mg/dL (ref 8.4–10.4)
Chloride: 100 mEq/L (ref 98–109)
Creatinine: 0.8 mg/dL (ref 0.7–1.3)
GLUCOSE: 293 mg/dL — AB (ref 70–140)
Potassium: 3.8 mEq/L (ref 3.5–5.1)
SODIUM: 137 meq/L (ref 136–145)
TOTAL PROTEIN: 6.7 g/dL (ref 6.4–8.3)

## 2016-01-23 MED ORDER — HEPARIN SOD (PORK) LOCK FLUSH 100 UNIT/ML IV SOLN
500.0000 [IU] | Freq: Once | INTRAVENOUS | Status: AC | PRN
  Administered 2016-01-23: 500 [IU]
  Filled 2016-01-23: qty 5

## 2016-01-23 MED ORDER — PACLITAXEL PROTEIN-BOUND CHEMO INJECTION 100 MG
100.0000 mg/m2 | Freq: Once | INTRAVENOUS | Status: AC
  Administered 2016-01-23: 200 mg via INTRAVENOUS
  Filled 2016-01-23: qty 40

## 2016-01-23 MED ORDER — SODIUM CHLORIDE 0.9% FLUSH
10.0000 mL | INTRAVENOUS | Status: DC | PRN
  Administered 2016-01-23: 10 mL
  Filled 2016-01-23: qty 10

## 2016-01-23 MED ORDER — SODIUM CHLORIDE 0.9 % IJ SOLN
10.0000 mL | INTRAMUSCULAR | Status: DC | PRN
  Administered 2016-01-23: 10 mL via INTRAVENOUS
  Filled 2016-01-23: qty 10

## 2016-01-23 MED ORDER — GEMCITABINE HCL CHEMO INJECTION 1 GM/26.3ML
800.0000 mg/m2 | Freq: Once | INTRAVENOUS | Status: AC
  Administered 2016-01-23: 1520 mg via INTRAVENOUS
  Filled 2016-01-23: qty 39.98

## 2016-01-23 MED ORDER — PROCHLORPERAZINE MALEATE 10 MG PO TABS
ORAL_TABLET | ORAL | Status: AC
  Filled 2016-01-23: qty 1

## 2016-01-23 MED ORDER — SODIUM CHLORIDE 0.9 % IV SOLN
Freq: Once | INTRAVENOUS | Status: AC
  Administered 2016-01-23: 14:00:00 via INTRAVENOUS

## 2016-01-23 MED ORDER — PROCHLORPERAZINE MALEATE 10 MG PO TABS
10.0000 mg | ORAL_TABLET | Freq: Once | ORAL | Status: AC
  Administered 2016-01-23: 10 mg via ORAL

## 2016-01-23 NOTE — Telephone Encounter (Signed)
Per LOS I have scheduled appt and notified the scheduler 

## 2016-01-23 NOTE — Progress Notes (Signed)
  [place] OFFICE PROGRESS NOTE   Diagnosis:  Pancreas cancer  INTERVAL HISTORY:   Mr. Zumalt returns as scheduled. He completed cycle 1 of gemcitabine/Abraxane 01/09/2016. He denies nausea/vomiting. No mouth sores. No diarrhea. No rash. No fever. Appetite is better. He states that he is "hungry". He continues to have drainage from the left lower leg wound. Mild intermittent abdominal pain. He takes tramadol as needed.  Objective:  Vital signs in last 24 hours:  Blood pressure 117/72, pulse 92, temperature 97.8 F (36.6 C), temperature source Oral, resp. rate 17, height 5\' 11"  (1.803 m), weight 153 lb 8 oz (69.6 kg), SpO2 100 %.    HEENT: No thrush or ulcers. Resp: Lungs clear bilaterally. Cardio: Regular rate and rhythm. GI: Abdomen soft and nontender. No hepatomegaly. No mass. Vascular: Stasis changes at the lower legs bilaterally. Bandage at the left lower leg. Port-A-Cath without erythema.    Lab Results:  Lab Results  Component Value Date   WBC 7.5 01/09/2016   HGB 9.9 (L) 01/09/2016   HCT 30.7 (L) 01/09/2016   MCV 83.4 01/09/2016   PLT 505 (H) 01/09/2016   NEUTROABS 3.9 01/09/2016    Imaging:  No results found.  Medications: I have reviewed the patient's current medications.  Assessment/Plan: 1. 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 Initiation of gemcitabine/Abraxane every 2 weeks beginning 01/09/2016 2. Diabetes  3. Peripheral vascular disease, status post stenting of the left subclavian artery and peripheral bypass surgery  4. Cardiomyopathy  5. Leg and foot ulcer secondary to diabetes and peripheral vascular disease-followed at the wound clinic  6. Obstructive jaundice secondary to #1, status post  placement of anexternal-internal biliary drain 12/03/2015, stent internalized 12/22/2015; bilirubin normal 01/09/2016  7. Anorexia/weight loss  8. Port-A-Cath placed 12/29/2015  9. Abdominal pain secondary to pancreas cancer   Disposition: Mr. Pearson appears stable. He has completed 1 cycle of gemcitabine/Abraxane. Plan to proceed with cycle 2 today as scheduled. He will return for a follow-up visit and cycle 3 in 2 weeks. He will continue close follow-up with the wound clinic.  Plan reviewed with Dr. Benay Spice.    Ned Card ANP/GNP-BC   01/23/2016  11:57 AM

## 2016-01-23 NOTE — Progress Notes (Signed)
Nutrition follow-up completed with patient receiving chemotherapy for cancer of the pancreas. Weight has continued to drop and was documented as 153.5 pounds October 9, down from 168.2 pounds September 1. Patient reports he thought he was eating better. He is not sure why he is losing weight. Noted glucose 293, and albumin 2.8.  Nutrition diagnosis: Inadequate oral intake continues. Severe malnutrition.  Continuous  Intervention:  Educated patient to increase calories and protein in meals and snacks throughout the day. Encouraged patient to increase oral nutrition supplements to 3 times daily and provided coupons for Glucerna and boost glucose control. Questions were answered.  Teach back method used.  Monitoring, evaluation, goals: Patient will tolerate increased calories and protein to minimize further weight loss.  Next visit Monday, October 23, during infusion.  **Disclaimer: This note was dictated with voice recognition software. Similar sounding words can inadvertently be transcribed and this note may contain transcription errors which may not have been corrected upon publication of note.**

## 2016-01-23 NOTE — Telephone Encounter (Signed)
Message sent to chemo scheduler to add chemo. Avs report and appointment schedule given to patient, per 01/23/16 los.

## 2016-01-23 NOTE — Patient Instructions (Signed)
[facility] Discharge Instructions for Patients Receiving Chemotherapy  Today you received the following chemotherapy agents: Abraxane and Gemzar   To help prevent nausea and vomiting after your treatment, we encourage you to take your nausea medication as directed.    If you develop nausea and vomiting that is not controlled by your nausea medication, call the clinic.   BELOW ARE SYMPTOMS THAT SHOULD BE REPORTED IMMEDIATELY:  *FEVER GREATER THAN 100.5 F  *CHILLS WITH OR WITHOUT FEVER  NAUSEA AND VOMITING THAT IS NOT CONTROLLED WITH YOUR NAUSEA MEDICATION  *UNUSUAL SHORTNESS OF BREATH  *UNUSUAL BRUISING OR BLEEDING  TENDERNESS IN MOUTH AND THROAT WITH OR WITHOUT PRESENCE OF ULCERS  *URINARY PROBLEMS  *BOWEL PROBLEMS  UNUSUAL RASH Items with * indicate a potential emergency and should be followed up as soon as possible.  Feel free to call the clinic you have any questions or concerns. The clinic phone number is [phone].  Please show the CHEMO ALERT CARD at check-in to the Emergency Department and triage nurse.   

## 2016-01-24 DIAGNOSIS — E1151 Type 2 diabetes mellitus with diabetic peripheral angiopathy without gangrene: Secondary | ICD-10-CM | POA: Diagnosis not present

## 2016-01-24 DIAGNOSIS — L97822 Non-pressure chronic ulcer of other part of left lower leg with fat layer exposed: Secondary | ICD-10-CM | POA: Diagnosis not present

## 2016-01-24 DIAGNOSIS — M86372 Chronic multifocal osteomyelitis, left ankle and foot: Secondary | ICD-10-CM | POA: Diagnosis not present

## 2016-01-24 DIAGNOSIS — L97522 Non-pressure chronic ulcer of other part of left foot with fat layer exposed: Secondary | ICD-10-CM | POA: Diagnosis not present

## 2016-01-24 DIAGNOSIS — Z95820 Peripheral vascular angioplasty status with implants and grafts: Secondary | ICD-10-CM | POA: Diagnosis not present

## 2016-01-24 DIAGNOSIS — I509 Heart failure, unspecified: Secondary | ICD-10-CM | POA: Diagnosis not present

## 2016-01-24 DIAGNOSIS — B9620 Unspecified Escherichia coli [E. coli] as the cause of diseases classified elsewhere: Secondary | ICD-10-CM | POA: Diagnosis not present

## 2016-01-24 DIAGNOSIS — I6529 Occlusion and stenosis of unspecified carotid artery: Secondary | ICD-10-CM | POA: Diagnosis not present

## 2016-01-24 DIAGNOSIS — E1169 Type 2 diabetes mellitus with other specified complication: Secondary | ICD-10-CM | POA: Diagnosis not present

## 2016-01-24 DIAGNOSIS — I429 Cardiomyopathy, unspecified: Secondary | ICD-10-CM | POA: Diagnosis not present

## 2016-01-24 DIAGNOSIS — I70248 Atherosclerosis of native arteries of left leg with ulceration of other part of lower left leg: Secondary | ICD-10-CM | POA: Diagnosis not present

## 2016-01-24 DIAGNOSIS — L03116 Cellulitis of left lower limb: Secondary | ICD-10-CM | POA: Diagnosis not present

## 2016-01-24 DIAGNOSIS — E11621 Type 2 diabetes mellitus with foot ulcer: Secondary | ICD-10-CM | POA: Diagnosis present

## 2016-01-24 DIAGNOSIS — Z79899 Other long term (current) drug therapy: Secondary | ICD-10-CM | POA: Diagnosis not present

## 2016-01-24 DIAGNOSIS — C250 Malignant neoplasm of head of pancreas: Secondary | ICD-10-CM | POA: Diagnosis not present

## 2016-01-24 DIAGNOSIS — Z87891 Personal history of nicotine dependence: Secondary | ICD-10-CM | POA: Diagnosis not present

## 2016-01-31 DIAGNOSIS — E11621 Type 2 diabetes mellitus with foot ulcer: Secondary | ICD-10-CM | POA: Diagnosis not present

## 2016-02-05 ENCOUNTER — Other Ambulatory Visit: Payer: Self-pay | Admitting: Oncology

## 2016-02-06 ENCOUNTER — Ambulatory Visit (HOSPITAL_BASED_OUTPATIENT_CLINIC_OR_DEPARTMENT_OTHER): Payer: BLUE CROSS/BLUE SHIELD

## 2016-02-06 ENCOUNTER — Telehealth: Payer: Self-pay | Admitting: Oncology

## 2016-02-06 ENCOUNTER — Telehealth: Payer: Self-pay | Admitting: *Deleted

## 2016-02-06 ENCOUNTER — Ambulatory Visit: Payer: BLUE CROSS/BLUE SHIELD | Admitting: Nutrition

## 2016-02-06 ENCOUNTER — Ambulatory Visit (HOSPITAL_COMMUNITY)
Admission: RE | Admit: 2016-02-06 | Discharge: 2016-02-06 | Disposition: A | Discharge status: Home / Self Care | Payer: BLUE CROSS/BLUE SHIELD | Source: Ambulatory Visit | Attending: Oncology | Admitting: Oncology

## 2016-02-06 ENCOUNTER — Other Ambulatory Visit (HOSPITAL_BASED_OUTPATIENT_CLINIC_OR_DEPARTMENT_OTHER): Payer: BLUE CROSS/BLUE SHIELD

## 2016-02-06 ENCOUNTER — Ambulatory Visit (HOSPITAL_BASED_OUTPATIENT_CLINIC_OR_DEPARTMENT_OTHER): Payer: BLUE CROSS/BLUE SHIELD | Admitting: Oncology

## 2016-02-06 ENCOUNTER — Ambulatory Visit: Payer: BLUE CROSS/BLUE SHIELD

## 2016-02-06 VITALS — BP 93/76 | HR 96 | Temp 97.7°F | Resp 17 | Ht 71.0 in | Wt 156.9 lb

## 2016-02-06 DIAGNOSIS — C250 Malignant neoplasm of head of pancreas: Secondary | ICD-10-CM

## 2016-02-06 DIAGNOSIS — Z5111 Encounter for antineoplastic chemotherapy: Secondary | ICD-10-CM | POA: Diagnosis not present

## 2016-02-06 DIAGNOSIS — Z95828 Presence of other vascular implants and grafts: Secondary | ICD-10-CM

## 2016-02-06 DIAGNOSIS — E119 Type 2 diabetes mellitus without complications: Secondary | ICD-10-CM

## 2016-02-06 LAB — COMPREHENSIVE METABOLIC PANEL
ALBUMIN: 2.7 g/dL — AB (ref 3.5–5.0)
ALK PHOS: 100 U/L (ref 40–150)
ALT: 8 U/L (ref 0–55)
AST: 9 U/L (ref 5–34)
Anion Gap: 9 mEq/L (ref 3–11)
BILIRUBIN TOTAL: 0.44 mg/dL (ref 0.20–1.20)
BUN: 4.9 mg/dL — AB (ref 7.0–26.0)
CALCIUM: 9.1 mg/dL (ref 8.4–10.4)
CO2: 25 mEq/L (ref 22–29)
Chloride: 100 mEq/L (ref 98–109)
Creatinine: 0.8 mg/dL (ref 0.7–1.3)
GLUCOSE: 275 mg/dL — AB (ref 70–140)
POTASSIUM: 4.2 meq/L (ref 3.5–5.1)
Sodium: 135 mEq/L — ABNORMAL LOW (ref 136–145)
TOTAL PROTEIN: 6.9 g/dL (ref 6.4–8.3)

## 2016-02-06 LAB — CBC WITH DIFFERENTIAL/PLATELET
BASO%: 0.2 % (ref 0.0–2.0)
BASOS ABS: 0 10*3/uL (ref 0.0–0.1)
EOS%: 4.9 % (ref 0.0–7.0)
Eosinophils Absolute: 0.5 10*3/uL (ref 0.0–0.5)
HEMATOCRIT: 27.2 % — AB (ref 38.4–49.9)
HEMOGLOBIN: 8.7 g/dL — AB (ref 13.0–17.1)
LYMPH#: 3 10*3/uL (ref 0.9–3.3)
LYMPH%: 29.8 % (ref 14.0–49.0)
MCH: 25.6 pg — AB (ref 27.2–33.4)
MCHC: 32 g/dL (ref 32.0–36.0)
MCV: 80 fL (ref 79.3–98.0)
MONO#: 1.5 10*3/uL — AB (ref 0.1–0.9)
MONO%: 14.9 % — ABNORMAL HIGH (ref 0.0–14.0)
NEUT#: 5 10*3/uL (ref 1.5–6.5)
NEUT%: 50.2 % (ref 39.0–75.0)
NRBC: 0 % (ref 0–0)
Platelets: 460 10*3/uL — ABNORMAL HIGH (ref 140–400)
RBC: 3.4 10*6/uL — ABNORMAL LOW (ref 4.20–5.82)
RDW: 15 % — AB (ref 11.0–14.6)
WBC: 9.9 10*3/uL (ref 4.0–10.3)

## 2016-02-06 MED ORDER — PROCHLORPERAZINE MALEATE 10 MG PO TABS
10.0000 mg | ORAL_TABLET | Freq: Once | ORAL | Status: AC
  Administered 2016-02-06: 10 mg via ORAL

## 2016-02-06 MED ORDER — HEPARIN SOD (PORK) LOCK FLUSH 100 UNIT/ML IV SOLN
500.0000 [IU] | Freq: Once | INTRAVENOUS | Status: AC | PRN
  Administered 2016-02-06: 500 [IU]
  Filled 2016-02-06: qty 5

## 2016-02-06 MED ORDER — PACLITAXEL PROTEIN-BOUND CHEMO INJECTION 100 MG
100.0000 mg/m2 | Freq: Once | INTRAVENOUS | Status: AC
  Administered 2016-02-06: 200 mg via INTRAVENOUS
  Filled 2016-02-06: qty 40

## 2016-02-06 MED ORDER — SODIUM CHLORIDE 0.9 % IJ SOLN
10.0000 mL | INTRAMUSCULAR | Status: DC | PRN
  Administered 2016-02-06: 10 mL via INTRAVENOUS
  Filled 2016-02-06: qty 10

## 2016-02-06 MED ORDER — SODIUM CHLORIDE 0.9 % IV SOLN
Freq: Once | INTRAVENOUS | Status: AC
  Administered 2016-02-06: 12:00:00 via INTRAVENOUS

## 2016-02-06 MED ORDER — SODIUM CHLORIDE 0.9 % IV SOLN
800.0000 mg/m2 | Freq: Once | INTRAVENOUS | Status: AC
  Administered 2016-02-06: 1520 mg via INTRAVENOUS
  Filled 2016-02-06: qty 39.98

## 2016-02-06 MED ORDER — SODIUM CHLORIDE 0.9% FLUSH
10.0000 mL | INTRAVENOUS | Status: DC | PRN
  Administered 2016-02-06: 10 mL
  Filled 2016-02-06: qty 10

## 2016-02-06 MED ORDER — PROCHLORPERAZINE MALEATE 10 MG PO TABS
ORAL_TABLET | ORAL | Status: AC
  Filled 2016-02-06: qty 1

## 2016-02-06 NOTE — Progress Notes (Signed)
*  Preliminary Results* Left lower extremity venous duplex completed. Study was technically limited due to poor patient cooperation secondary to pain. Visualized veins of the left lower extremity are negative for deep vein thrombosis. There is no evidence of left Baker's cyst.  02/06/2016 2:32 PM  Maudry Mayhew, BS, RVT, RDCS, RDMS

## 2016-02-06 NOTE — Telephone Encounter (Signed)
Received report from vascular lab. Negative for DVT in L leg. Dr. Benay Spice made aware.

## 2016-02-06 NOTE — Progress Notes (Signed)
Nutrition follow-up completed with patient receiving chemotherapy for cancer of the pancreas. Weight has improved and was documented as 156 pounds today increased from 153.5 pounds October 9. Patient reports he continues to eat well. Patient is drinking Ensure Plus per his report.  Nutrition diagnosis: Inadequate oral intake improved. Severe malnutrition continues but has improved.  Intervention: Patient educated to continue high-calorie, high-protein foods throughout the day to promote weight maintenance/weight gain. Encouraged oral nutrition supplements between meals.  Provided coupons. Questions were answered.  Teach back method used.  Monitoring, evaluation, goals: Patient will continue to tolerate increased calories and protein for weight maintenance/weight gain.  Next visit: Monday, November 20, during infusion.  **Disclaimer: This note was dictated with voice recognition software. Similar sounding words can inadvertently be transcribed and this note may contain transcription errors which may not have been corrected upon publication of note.**

## 2016-02-06 NOTE — Telephone Encounter (Signed)
GAVE PATIENT AVS REPORT AND APPOINTMENT FOR November AND DOPPLER TO DAY AT [facility] 2PM -INFUSION MADE AWARE OF DOPPLER APPOINTMENT.

## 2016-02-06 NOTE — Patient Instructions (Signed)
[facility] Discharge Instructions for Patients Receiving Chemotherapy  Today you received the following chemotherapy agents:  Abraxane, Gemzar  To help prevent nausea and vomiting after your treatment, we encourage you to take your nausea medication as prescribed.   If you develop nausea and vomiting that is not controlled by your nausea medication, call the clinic.   BELOW ARE SYMPTOMS THAT SHOULD BE REPORTED IMMEDIATELY:  *FEVER GREATER THAN 100.5 F  *CHILLS WITH OR WITHOUT FEVER  NAUSEA AND VOMITING THAT IS NOT CONTROLLED WITH YOUR NAUSEA MEDICATION  *UNUSUAL SHORTNESS OF BREATH  *UNUSUAL BRUISING OR BLEEDING  TENDERNESS IN MOUTH AND THROAT WITH OR WITHOUT PRESENCE OF ULCERS  *URINARY PROBLEMS  *BOWEL PROBLEMS  UNUSUAL RASH Items with * indicate a potential emergency and should be followed up as soon as possible.  Feel free to call the clinic you have any questions or concerns. The clinic phone number is [phone].  Please show the CHEMO ALERT CARD at check-in to the Emergency Department and triage nurse.   

## 2016-02-06 NOTE — Progress Notes (Signed)
  [place] OFFICE PROGRESS NOTE   Diagnosis: Pancreas cancer  INTERVAL HISTORY:   Donald Steele Has returns as scheduled. He completed another treatment with gemcitabine/Abraxane on 01/23/2016. He reports tolerating the chemotherapy well. He continues to have numbness in the toes. He feels the left foot numbness has progressed. He continues to have a nonhealing ulcer at the left pretibial area. He will see Dr. Con Memos tomorrow. He reports improvement in abdominal pain. His appetite is improved. He has noted swelling of the left leg for the past few days. No pain.  Objective:  Vital signs in last 24 hours:  Blood pressure 93/76, pulse 96, temperature 97.7 F (36.5 C), temperature source Oral, resp. rate 17, height 5\' 11"  (1.803 m), weight 156 lb 14.4 oz (71.2 kg), SpO2 100 %.    HEENT: [place] over the tongue, no buccal thrush Resp: Lungs clear bilaterally Cardio: Regular rate and rhythm GI: No hepatosplenomegaly, nontender Vascular: Trace edema throughout the left lower leg  Skin: Deep 4-5 cm left pretibial ulcer   Portacath/PICC-without erythema  Lab Results:  Lab Results  Component Value Date   WBC 9.9 02/06/2016   HGB 8.7 (L) 02/06/2016   HCT 27.2 (L) 02/06/2016   MCV 80.0 02/06/2016   PLT 460 (H) 02/06/2016   NEUTROABS 5.0 02/06/2016     Medications: I have reviewed the patient's current medications.  Assessment/Plan: 1. 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 Initiation of gemcitabine/Abraxane every 2 weeks beginning 01/09/2016 2. Diabetes  3. Peripheral vascular disease, status post stenting of the left subclavian artery and peripheral bypass surgery  4. Cardiomyopathy  5. Leg and foot ulcer secondary to diabetes and  peripheral vascular disease-followed at the wound clinic  6. Obstructive jaundice secondary to #1, status post placement of anexternal-internal biliary drain 12/03/2015, stent internalized 12/22/2015; bilirubin normal 01/09/2016  7. Anorexia/weight loss  8. Port-A-Cath placed 12/29/2015  9. Abdominal pain secondary to pancreas cancer     Disposition:  Donald Steele is tolerating the chemotherapy well. The plan is to proceed with a third cycle today. He will return for an office visit and chemotherapy in 2 weeks.  He continues follow-up at the wound clinic for treatment of the left leg ulcer. We will refer him for a Doppler of the left leg to rule out a deep vein thrombosis.  Betsy Coder, MD  02/06/2016  10:31 AM

## 2016-02-07 ENCOUNTER — Telehealth: Payer: Self-pay | Admitting: Medical

## 2016-02-07 NOTE — Telephone Encounter (Signed)
Get him in for diabetes f/u.   Have him bring recent blood sugar numbers

## 2016-02-07 NOTE — Telephone Encounter (Signed)
Made him a appt for nov 1st

## 2016-02-14 DIAGNOSIS — E11621 Type 2 diabetes mellitus with foot ulcer: Secondary | ICD-10-CM | POA: Diagnosis not present

## 2016-02-15 ENCOUNTER — Encounter: Payer: Self-pay | Admitting: Medical

## 2016-02-15 ENCOUNTER — Ambulatory Visit (INDEPENDENT_AMBULATORY_CARE_PROVIDER_SITE_OTHER): Payer: BLUE CROSS/BLUE SHIELD | Admitting: Medical

## 2016-02-15 ENCOUNTER — Telehealth: Payer: Self-pay | Admitting: Medical

## 2016-02-15 VITALS — BP 108/62 | HR 56 | Wt 179.0 lb

## 2016-02-15 DIAGNOSIS — E785 Hyperlipidemia, unspecified: Secondary | ICD-10-CM

## 2016-02-15 DIAGNOSIS — C250 Malignant neoplasm of head of pancreas: Secondary | ICD-10-CM

## 2016-02-15 DIAGNOSIS — L909 Atrophic disorder of skin, unspecified: Secondary | ICD-10-CM

## 2016-02-15 DIAGNOSIS — E1151 Type 2 diabetes mellitus with diabetic peripheral angiopathy without gangrene: Secondary | ICD-10-CM

## 2016-02-15 DIAGNOSIS — L97919 Non-pressure chronic ulcer of unspecified part of right lower leg with unspecified severity: Secondary | ICD-10-CM

## 2016-02-15 DIAGNOSIS — R238 Other skin changes: Secondary | ICD-10-CM

## 2016-02-15 DIAGNOSIS — I110 Hypertensive heart disease with heart failure: Secondary | ICD-10-CM

## 2016-02-15 DIAGNOSIS — I739 Peripheral vascular disease, unspecified: Secondary | ICD-10-CM

## 2016-02-15 DIAGNOSIS — I5042 Chronic combined systolic (congestive) and diastolic (congestive) heart failure: Secondary | ICD-10-CM

## 2016-02-15 LAB — GLUCOSE, POCT (MANUAL RESULT ENTRY): POC GLUCOSE: 213 mg/dL — AB (ref 70–99)

## 2016-02-15 LAB — POCT GLYCOSYLATED HEMOGLOBIN (HGB A1C): HEMOGLOBIN A1C: 9.6

## 2016-02-15 MED ORDER — CIPROFLOXACIN HCL 500 MG PO TABS: 500.0000 mg | ORAL_TABLET | Freq: Two times a day (BID) | ORAL | 0 refills | Status: DC

## 2016-02-15 MED ORDER — INSULIN LISPRO 100 UNIT/ML (KWIKPEN): 5.0000 [IU] | PEN_INJECTOR | Freq: Three times a day (TID) | SUBCUTANEOUS | 5 refills | Status: DC

## 2016-02-15 MED ORDER — INSULIN DEGLUDEC 100 UNIT/ML ~~LOC~~ SOPN: 17.0000 [IU] | PEN_INJECTOR | Freq: Every day | SUBCUTANEOUS | 5 refills | Status: DC

## 2016-02-15 MED ORDER — MICROLET LANCETS MISC: 1 refills | Status: DC

## 2016-02-15 MED ORDER — DOXYCYCLINE HYCLATE 100 MG PO CAPS: 100.0000 mg | ORAL_CAPSULE | Freq: Two times a day (BID) | ORAL | 0 refills | Status: DC

## 2016-02-15 MED ORDER — BAYER CONTOUR NEXT EZ W/DEVICE KIT: 10.0000 | PACK | Freq: Two times a day (BID) | 0 refills | Status: DC

## 2016-02-15 MED ORDER — GLUCOSE BLOOD VI STRP: ORAL_STRIP | 1 refills | Status: AC

## 2016-02-15 MED ORDER — ONETOUCH VERIO W/DEVICE KIT: 1.0000 | PACK | Freq: Every day | 0 refills | Status: AC

## 2016-02-15 NOTE — Progress Notes (Signed)
Subjective: Chief Complaint  Patient presents with  . dm check    pt is feeling light head , having sone dizzness, pt is not sure of the insulin that he is using    Here for f/u on diabetes.  Since last visit here has been seeing wound clinic regularly, oncology regularly.   Diabetes - he ran out of meal time insulin about a month ago.   He thinks he is taking tresiba night time.   He notes a month ago his girlfriend was killed in a drive by shooting.  Her car was taken in as evidence and his glucometer and testing supplies were in her car.  hasn't been able to retreive those items.  Thus, not checking sugars.  Eating 1-2 meals daily, ensure shakes at times.  Not much appetite, but is holding his weight, doing ok on cancer therapy  His main concern today is his ulcer.   Since last visit here he has had worsening ulcer of left lower leg.  Just saw wound clinic yesterday.  Left foot has been swelling some.   He hasn't had revascularization surgery of left leg but hasn't had the right.  Surgery to revascularize to debride the ulcer is on hold.   As long as he is getting palliative chemo, surgery can't really be performed.   Past Medical History:  Diagnosis Date  . 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 Cardiomyopathy ([place])    a. Echo 2/17 - EF 35-40%, mod diff HK, inf-septal, inf-lat AK, Gr 1 DD, mild MR  . Carotid stenosis    a. Carotid US 2/17 - bilat ICA 1-39%  . Chronic combined systolic and diastolic CHF (congestive heart failure) ([place])   . Coronary artery disease   . Diabetes mellitus   . Duodenal stenosis   . Generalized weakness   . History of nuclear stress test    a. Nuc study 2/17 - Myocardial perfusion is abnormal. Findings consistent with prior myocardial infarction (inf, inf-lat, apical inf, apical lateral) with peri-infarct ischemia in apical inferolateral and apical lateral regions. This is a high risk study. Overall left ventricular systolic function was  abnormal. LV cavity size is severely enlarged. Nuclear stress EF: 29%.   . Hyperlipidemia   . Hypertension   . Peripheral vascular disease due to secondary diabetes mellitus ([place])    a. s/p R Fem-Tibial bypass 2/17   Current Outpatient Prescriptions on File Prior to Visit  Medication Sig Dispense Refill  . amoxicillin-clavulanate (AUGMENTIN) 875-125 MG tablet Take 1 tablet by mouth 2 (two) times daily.  0  . aspirin 81 MG EC tablet Take 1 tablet (81 mg total) by mouth daily. Swallow whole. 90 tablet 3  . atorvastatin (LIPITOR) 40 MG tablet Take 1 tablet (40 mg total) by mouth daily. 90 tablet 3  . collagenase (SANTYL) ointment Apply topically daily. Apply to right foot wound daily. Place moist saline gauze on top and wrap foot daily. 15 g 0  . lidocaine-prilocaine (EMLA) cream Apply 1 application topically as needed. Apply to port site 1 hr prior to port access and cover with plastic wrap 30 g 0  . morphine (MS CONTIN) 15 MG 12 hr tablet Take 1 tablet (15 mg total) by mouth every 12 (twelve) hours. 60 tablet 0  . NEEDLE, DISP, 30 G (BD DISP NEEDLES) 30G X 1/2" MISC 1 each by Does not apply route at bedtime. 90 each 3  . clopidogrel (PLAVIX) 75 MG tablet Take 1  tablet (75 mg total) by mouth daily. (Patient not taking: Reported on 02/15/2016) 90 tablet 3  . Insulin Degludec (TRESIBA FLEXTOUCH [place]) Inject 15 Units into the skin at bedtime.    . insulin glargine (LANTUS) 100 UNIT/ML injection Inject 0.14 mLs (14 Units total) into the skin at bedtime. (Patient not taking: Reported on 02/06/2016) 10 mL 11  . insulin lispro (HUMALOG) 100 UNIT/ML injection Inject 5 Units into the skin 3 (three) times daily before meals. Take 5 units at mealtime.  If you skip a meal, then just take 2.5 unites.  This is in addition to the long acting pen at night.    Marland Kitchen VITAMIN A PO Take 1 tablet by mouth daily.     Current Facility-Administered Medications on File Prior to Visit  Medication Dose Route Frequency Provider  Last Rate Last Dose  . sodium chloride flush (NS) 0.9 % injection 10 mL  10 mL Intravenous PRN Ladell Pier, MD   10 mL at 01/02/16 1550  . sodium chloride flush (NS) 0.9 % injection 10 mL  10 mL Intracatheter PRN Ladell Pier, MD   10 mL at 01/09/16 1559    Past Surgical History:  Procedure Laterality Date  . CARDIAC CATHETERIZATION N/A 06/30/2015   Procedure: Left Heart Cath and Coronary Angiography;  Surgeon: Jettie Booze, MD;  Location: [facility];  Service: Cardiovascular;  Laterality: N/A;  . DUODENAL STENT PLACEMENT N/A 12/06/2015   Procedure: DUODENAL STENT PLACEMENT;  Surgeon: Carol Ada, MD;  [facility];  Service: Endoscopy;  Laterality: N/A;  . ENDARTERECTOMY FEMORAL Right 06/02/2015   Procedure: ENDARTERECTOMY RIGHT PROFUNDA FEMORIS ARTERY;  Surgeon: Mal Misty, MD;  Location: [facility];  Service: Vascular;  Laterality: Right;  . ERCP N/A 12/02/2015   Procedure: ENDOSCOPIC RETROGRADE CHOLANGIOPANCREATOGRAPHY (ERCP);  Surgeon: Carol Ada, MD;  Location: [facility];  Service: Endoscopy;  Laterality: N/A;  . ESOPHAGOGASTRODUODENOSCOPY (EGD) WITH PROPOFOL N/A 12/06/2015   Procedure: ESOPHAGOGASTRODUODENOSCOPY (EGD) WITH PROPOFOL;  Surgeon: Carol Ada, MD;  [facility];  Service: Endoscopy;  Laterality: N/A;  . EUS N/A 12/06/2015   Procedure: UPPER ENDOSCOPIC ULTRASOUND (EUS) LINEAR;  Surgeon: Carol Ada, MD;  [facility];  Service: Endoscopy;  Laterality: N/A;  . FEMORAL-TIBIAL BYPASS GRAFT Right 06/02/2015   Procedure: RIGHT PROFUNDA FEMORIS TO POSTERIOR TIBIAL ARTERY BYPASS USING RIGHT NON-REVERSED GREATER SAPHENOUS VEIN,  SUBCUTANEOUS TUNNEL;  Surgeon: Mal Misty, MD;  Location: [facility];  Service: Vascular;  Laterality: Right;  . INTRAOPERATIVE ARTERIOGRAM Right 06/02/2015   Procedure: INTRA OPERATIVE ARTERIOGRAM RIGHT LOWER LEG;  Surgeon: Mal Misty, MD;  Location: [facility];  Service: Vascular;  Laterality: Right;  . IR  GENERIC HISTORICAL  12/03/2015   IR INT EXT BILIARY DRAIN WITH CHOLANGIOGRAM 12/03/2015 [name], MD [facility]-INTER[facility]  . IR GENERIC HISTORICAL  12/22/2015   IR BILIARY STENT(S) EXISTING ACCESS INC DILATION CATH EXCHANGE 12/22/2015 Corrie Mckusick, DO [facility]  . MULTIPLE TOOTH EXTRACTIONS    . No history of Surgery     . PERIPHERAL VASCULAR CATHETERIZATION N/A 06/01/2015   Procedure: Abdominal Aortogram;  Surgeon: Serafina Mitchell, MD;  Location: [facility];  Service: Cardiovascular;  Laterality: N/A;  . PERIPHERAL VASCULAR CATHETERIZATION N/A 07/07/2015   Procedure: Aortic Arch Angiography;  Surgeon: Conrad Guymon, MD;  Location: [facility];  Service: Cardiovascular;  Laterality: N/A;  . PERIPHERAL VASCULAR CATHETERIZATION N/A 07/07/2015   Procedure: Upper Extremity Angiography;  Surgeon: Jannette Fogo  Bridgett Larsson, MD;  Location: [facility];  Service: Cardiovascular;  Laterality: N/A;  . PERIPHERAL VASCULAR CATHETERIZATION Left 07/07/2015   Procedure: Peripheral Vascular Intervention;  Surgeon: Conrad Rothschild, MD;  Location: [facility];  Service: Cardiovascular;  Laterality: Left;  left subclavian and axillary  . PORTACATH PLACEMENT N/A 12/29/2015   Procedure: INSERTION PORT-A-CATH;  Surgeon: [name], MD;  Location: [facility];  Service: General;  Laterality: N/A;  . VEIN HARVEST Right 06/02/2015   Procedure: VEIN HARVEST RIGHT GREATER SAPHENOUS VEIN;  Surgeon: Mal Misty, MD;  Location: [facility];  Service: Vascular;  Laterality: Right;    ROS as in subjective   Objective: BP 108/62   Pulse (!) 56   Wt 179 lb (81.2 kg)   BMI 24.97 kg/m   Wt Readings from Last 3 Encounters:  02/15/16 179 lb (81.2 kg)  02/06/16 156 lb 14.4 oz (71.2 kg)  01/23/16 153 lb 8 oz (69.6 kg)    Gen: nad Foul wound odor from left lower leg Lungs clear Neuro: limited exam, walking somewhat slow to exam room without assistance, CN2-12 intact Left lower leg with 8cm high x 4cm wide ulcer with  necrotic tissue lower half and upper half with gray white purulent foul discharge, tendon visible, left foot swollen Foot warm, and barely palpable left pedal pulses palpated   Assessment: Encounter Diagnoses  Name Primary?  . DM (diabetes mellitus), type 2 with peripheral vascular complications (HCC) Yes  . Cancer of head of pancreas ([place])   . Hypertensive heart disease with heart failure ([place])   . PAD (peripheral artery disease) ([place])   . Chronic combined systolic and diastolic CHF (congestive heart failure) ([place])   . Hyperlipidemia, unspecified hyperlipidemia type   . Non-healing ulcer of lower extremity, right, with unspecified severity ([place])   . Skin breakdown      Plan: I expressed my sympathy for the loss of his girlfriend earlier this month.  I appreciate her support of him and bringing him in last visit, but sad that she is no longer with [place].  Diabetes- noncompliance due to financial barriers, transportation barriers, overall state of health burden.   I encouraged him to use both the meal time and basal insulin to help control sugars and help as much as possible with wound healing.  Gave new glucometer kit today so he can monitor his sugars  Called pharmacy to verify last refill on insulins and to inquire of any difficulties with him getting the medication.  He continues on chemotherapy per oncology.  Reviewed oncology notes from 02/06/16.    His big issue today is his ulcer of left lower leg and PVD.    I called and spoke to his wound care doctor.  This is a very difficult situation as he really needs debridement and wound vac placement in the OR, but without palliative chemotherapy, life expectancy would be much shorter, maybe just a few weeks.   At this point the goal is to manage the wound as best as possible in the meantime.  We will start him on doxycycline and Cipro antibiotics  We adjusted his medication for diabetes today and sent refills.  We will call back and check  with pharmacy tomorrow to make sure there were no issues at the pharmacy.   Patient Instructions  Recommendations:  Begin Doxycycline antibiotic twice daily for leg wound  Begin Cipro antibiotic twice daily for leg wound  Continue Tresiba long acting insulin at bedtime but  increase to 17 unites nightly  restart Humalog meal time insulin, 5 units with meals  Monitor your sugar readings fasting before breakfast and before meals  Call in a week to update me on your sugars  F/u with oncology and wound clinic next week as planned.  Get me glucose readings in 1-2 weeks.   Spent > 45 minutes face to face with patient in discussion of symptoms, evaluation, plan and recommendations.

## 2016-02-15 NOTE — Patient Instructions (Signed)
Recommendations:  Begin Doxycycline antibiotic twice daily for leg wound  Begin Cipro antibiotic twice daily for leg wound  Continue Tresiba long acting insulin at bedtime but increase to 17 unites nightly  restart Humalog meal time insulin, 5 units with meals  Monitor your sugar readings fasting before breakfast and before meals  Call in a week to update me on your sugars

## 2016-02-15 NOTE — Telephone Encounter (Signed)
Rcvd fax from [facility] stating that Contour Next EZ Meter is Non Formulary and only LifeScan OneTouch Verio can be dispensed

## 2016-02-15 NOTE — Telephone Encounter (Signed)
Complete this change meter and fax to pharmacy

## 2016-02-18 ENCOUNTER — Other Ambulatory Visit: Payer: Self-pay | Admitting: Oncology

## 2016-02-20 ENCOUNTER — Ambulatory Visit: Payer: BLUE CROSS/BLUE SHIELD

## 2016-02-20 ENCOUNTER — Other Ambulatory Visit: Payer: BLUE CROSS/BLUE SHIELD

## 2016-02-20 ENCOUNTER — Ambulatory Visit: Payer: BLUE CROSS/BLUE SHIELD | Admitting: Nurse Practitioner

## 2016-02-21 ENCOUNTER — Emergency Department (HOSPITAL_COMMUNITY): Payer: BLUE CROSS/BLUE SHIELD

## 2016-02-21 ENCOUNTER — Inpatient Hospital Stay (HOSPITAL_COMMUNITY): Payer: BLUE CROSS/BLUE SHIELD

## 2016-02-21 ENCOUNTER — Encounter (HOSPITAL_BASED_OUTPATIENT_CLINIC_OR_DEPARTMENT_OTHER): Payer: BLUE CROSS/BLUE SHIELD | Attending: Surgery

## 2016-02-21 ENCOUNTER — Inpatient Hospital Stay (HOSPITAL_COMMUNITY)
Admission: EM | Admit: 2016-02-21 | Discharge: 2016-03-01 | DRG: 853 | Disposition: A | Discharge status: Home Health | Payer: BLUE CROSS/BLUE SHIELD | Attending: Family Medicine | Admitting: Family Medicine

## 2016-02-21 ENCOUNTER — Encounter (HOSPITAL_COMMUNITY): Payer: Self-pay | Admitting: *Deleted

## 2016-02-21 DIAGNOSIS — M86662 Other chronic osteomyelitis, left tibia and fibula: Secondary | ICD-10-CM | POA: Diagnosis present

## 2016-02-21 DIAGNOSIS — E11621 Type 2 diabetes mellitus with foot ulcer: Secondary | ICD-10-CM | POA: Diagnosis present

## 2016-02-21 DIAGNOSIS — L97526 Non-pressure chronic ulcer of other part of left foot with bone involvement without evidence of necrosis: Secondary | ICD-10-CM | POA: Diagnosis not present

## 2016-02-21 DIAGNOSIS — E785 Hyperlipidemia, unspecified: Secondary | ICD-10-CM | POA: Diagnosis present

## 2016-02-21 DIAGNOSIS — Z95820 Peripheral vascular angioplasty status with implants and grafts: Secondary | ICD-10-CM | POA: Insufficient documentation

## 2016-02-21 DIAGNOSIS — T368X5A Adverse effect of other systemic antibiotics, initial encounter: Secondary | ICD-10-CM | POA: Diagnosis not present

## 2016-02-21 DIAGNOSIS — L97826 Non-pressure chronic ulcer of other part of left lower leg with bone involvement without evidence of necrosis: Secondary | ICD-10-CM | POA: Diagnosis not present

## 2016-02-21 DIAGNOSIS — E1169 Type 2 diabetes mellitus with other specified complication: Secondary | ICD-10-CM | POA: Diagnosis present

## 2016-02-21 DIAGNOSIS — E43 Unspecified severe protein-calorie malnutrition: Secondary | ICD-10-CM | POA: Diagnosis present

## 2016-02-21 DIAGNOSIS — C250 Malignant neoplasm of head of pancreas: Secondary | ICD-10-CM | POA: Diagnosis present

## 2016-02-21 DIAGNOSIS — Z87891 Personal history of nicotine dependence: Secondary | ICD-10-CM

## 2016-02-21 DIAGNOSIS — I96 Gangrene, not elsewhere classified: Secondary | ICD-10-CM | POA: Diagnosis not present

## 2016-02-21 DIAGNOSIS — C259 Malignant neoplasm of pancreas, unspecified: Secondary | ICD-10-CM | POA: Diagnosis not present

## 2016-02-21 DIAGNOSIS — I110 Hypertensive heart disease with heart failure: Secondary | ICD-10-CM | POA: Diagnosis present

## 2016-02-21 DIAGNOSIS — I429 Cardiomyopathy, unspecified: Secondary | ICD-10-CM | POA: Insufficient documentation

## 2016-02-21 DIAGNOSIS — X12XXXA Contact with other hot fluids, initial encounter: Secondary | ICD-10-CM | POA: Diagnosis not present

## 2016-02-21 DIAGNOSIS — I255 Ischemic cardiomyopathy: Secondary | ICD-10-CM | POA: Diagnosis present

## 2016-02-21 DIAGNOSIS — E876 Hypokalemia: Secondary | ICD-10-CM | POA: Diagnosis present

## 2016-02-21 DIAGNOSIS — Z6824 Body mass index (BMI) 24.0-24.9, adult: Secondary | ICD-10-CM

## 2016-02-21 DIAGNOSIS — A419 Sepsis, unspecified organism: Secondary | ICD-10-CM | POA: Diagnosis present

## 2016-02-21 DIAGNOSIS — K5900 Constipation, unspecified: Secondary | ICD-10-CM | POA: Diagnosis not present

## 2016-02-21 DIAGNOSIS — E1140 Type 2 diabetes mellitus with diabetic neuropathy, unspecified: Secondary | ICD-10-CM | POA: Diagnosis present

## 2016-02-21 DIAGNOSIS — T25222A Burn of second degree of left foot, initial encounter: Secondary | ICD-10-CM | POA: Insufficient documentation

## 2016-02-21 DIAGNOSIS — Z794 Long term (current) use of insulin: Secondary | ICD-10-CM

## 2016-02-21 DIAGNOSIS — I5042 Chronic combined systolic (congestive) and diastolic (congestive) heart failure: Secondary | ICD-10-CM | POA: Diagnosis present

## 2016-02-21 DIAGNOSIS — Z7902 Long term (current) use of antithrombotics/antiplatelets: Secondary | ICD-10-CM

## 2016-02-21 DIAGNOSIS — E1152 Type 2 diabetes mellitus with diabetic peripheral angiopathy with gangrene: Secondary | ICD-10-CM | POA: Diagnosis present

## 2016-02-21 DIAGNOSIS — I739 Peripheral vascular disease, unspecified: Secondary | ICD-10-CM | POA: Diagnosis not present

## 2016-02-21 DIAGNOSIS — Z7982 Long term (current) use of aspirin: Secondary | ICD-10-CM

## 2016-02-21 DIAGNOSIS — L97529 Non-pressure chronic ulcer of other part of left foot with unspecified severity: Secondary | ICD-10-CM | POA: Diagnosis present

## 2016-02-21 DIAGNOSIS — L03116 Cellulitis of left lower limb: Secondary | ICD-10-CM | POA: Insufficient documentation

## 2016-02-21 DIAGNOSIS — M869 Osteomyelitis, unspecified: Secondary | ICD-10-CM

## 2016-02-21 DIAGNOSIS — Z79899 Other long term (current) drug therapy: Secondary | ICD-10-CM | POA: Diagnosis not present

## 2016-02-21 DIAGNOSIS — N179 Acute kidney failure, unspecified: Secondary | ICD-10-CM | POA: Diagnosis not present

## 2016-02-21 DIAGNOSIS — R262 Difficulty in walking, not elsewhere classified: Secondary | ICD-10-CM

## 2016-02-21 DIAGNOSIS — I2510 Atherosclerotic heart disease of native coronary artery without angina pectoris: Secondary | ICD-10-CM | POA: Diagnosis present

## 2016-02-21 DIAGNOSIS — D638 Anemia in other chronic diseases classified elsewhere: Secondary | ICD-10-CM | POA: Diagnosis present

## 2016-02-21 DIAGNOSIS — L97509 Non-pressure chronic ulcer of other part of unspecified foot with unspecified severity: Secondary | ICD-10-CM

## 2016-02-21 LAB — COMPREHENSIVE METABOLIC PANEL
ALBUMIN: 2.5 g/dL — AB (ref 3.5–5.0)
ALT: 10 U/L — ABNORMAL LOW (ref 17–63)
ANION GAP: 8 (ref 5–15)
AST: 13 U/L — ABNORMAL LOW (ref 15–41)
Alkaline Phosphatase: 133 U/L — ABNORMAL HIGH (ref 38–126)
BUN: 7 mg/dL (ref 6–20)
CO2: 28 mmol/L (ref 22–32)
Calcium: 8.4 mg/dL — ABNORMAL LOW (ref 8.9–10.3)
Chloride: 94 mmol/L — ABNORMAL LOW (ref 101–111)
Creatinine, Ser: 0.74 mg/dL (ref 0.61–1.24)
GFR calc Af Amer: >60 mL/min (ref >60)
GFR calc non Af Amer: >60 mL/min (ref >60)
GLUCOSE: 266 mg/dL — AB (ref 65–99)
POTASSIUM: 3.3 mmol/L — AB (ref 3.5–5.1)
Sodium: 130 mmol/L — ABNORMAL LOW (ref 135–145)
TOTAL PROTEIN: 7 g/dL (ref 6.5–8.1)
Total Bilirubin: 0.8 mg/dL (ref 0.3–1.2)

## 2016-02-21 LAB — CBC WITH DIFFERENTIAL/PLATELET
BASOS ABS: 0 10*3/uL (ref 0.0–0.1)
BASOS PCT: 0 %
BASOS PCT: 0 %
Basophils Absolute: 0 10*3/uL (ref 0.0–0.1)
Eosinophils Absolute: 0 10*3/uL (ref 0.0–0.7)
Eosinophils Absolute: 0 10*3/uL (ref 0.0–0.7)
Eosinophils Relative: 0 %
Eosinophils Relative: 0 %
HEMATOCRIT: 27.5 % — AB (ref 39.0–52.0)
HEMATOCRIT: 29.1 % — AB (ref 39.0–52.0)
HEMOGLOBIN: 8.8 g/dL — AB (ref 13.0–17.0)
HEMOGLOBIN: 9.5 g/dL — AB (ref 13.0–17.0)
LYMPHS ABS: 1.8 10*3/uL (ref 0.7–4.0)
Lymphocytes Relative: 8 %
Lymphocytes Relative: 8 %
Lymphs Abs: 2.3 10*3/uL (ref 0.7–4.0)
MCH: 25.1 pg — ABNORMAL LOW (ref 26.0–34.0)
MCH: 25.6 pg — ABNORMAL LOW (ref 26.0–34.0)
MCHC: 32 g/dL (ref 30.0–36.0)
MCHC: 32.6 g/dL (ref 30.0–36.0)
MCV: 78.3 fL (ref 78.0–100.0)
MCV: 78.4 fL (ref 78.0–100.0)
MONOS PCT: 13 %
Monocytes Absolute: 3.1 10*3/uL — ABNORMAL HIGH (ref 0.1–1.0)
Monocytes Absolute: 3.5 10*3/uL — ABNORMAL HIGH (ref 0.1–1.0)
Monocytes Relative: 13 %
NEUTROS ABS: 18.4 10*3/uL — AB (ref 1.7–7.7)
NEUTROS ABS: 22.1 10*3/uL — AB (ref 1.7–7.7)
NEUTROS PCT: 79 %
NEUTROS PCT: 79 %
Platelets: 581 10*3/uL — ABNORMAL HIGH (ref 150–400)
Platelets: 612 10*3/uL — ABNORMAL HIGH (ref 150–400)
RBC: 3.51 MIL/uL — ABNORMAL LOW (ref 4.22–5.81)
RBC: 3.71 MIL/uL — AB (ref 4.22–5.81)
RDW: 15.8 % — ABNORMAL HIGH (ref 11.5–15.5)
RDW: 15.8 % — ABNORMAL HIGH (ref 11.5–15.5)
WBC: 23.3 10*3/uL — ABNORMAL HIGH (ref 4.0–10.5)
WBC: 27.9 10*3/uL — ABNORMAL HIGH (ref 4.0–10.5)

## 2016-02-21 LAB — C-REACTIVE PROTEIN: CRP: 20.1 mg/dL — AB (ref <1.0)

## 2016-02-21 LAB — I-STAT CG4 LACTIC ACID, ED: Lactic Acid, Venous: 1.12 mmol/L (ref 0.5–1.9)

## 2016-02-21 LAB — SEDIMENTATION RATE: Sed Rate: 97 mm/hr — ABNORMAL HIGH (ref 0–16)

## 2016-02-21 LAB — PREALBUMIN

## 2016-02-21 LAB — GLUCOSE, CAPILLARY: GLUCOSE-CAPILLARY: 197 mg/dL — AB (ref 65–99)

## 2016-02-21 LAB — LIPASE, BLOOD: Lipase: 11 U/L (ref 11–51)

## 2016-02-21 MED ORDER — PIPERACILLIN-TAZOBACTAM 3.375 G IVPB
3.3750 g | Freq: Three times a day (TID) | INTRAVENOUS | Status: AC
  Administered 2016-02-22 – 2016-02-25 (×12): 3.375 g via INTRAVENOUS
  Filled 2016-02-21 (×13): qty 50

## 2016-02-21 MED ORDER — GADOBENATE DIMEGLUMINE 529 MG/ML IV SOLN
15.0000 mL | Freq: Once | INTRAVENOUS | Status: AC | PRN
  Administered 2016-02-21: 15 mL via INTRAVENOUS

## 2016-02-21 MED ORDER — ATORVASTATIN CALCIUM 40 MG PO TABS
40.0000 mg | ORAL_TABLET | Freq: Every day | ORAL | Status: DC
  Administered 2016-02-21 – 2016-03-01 (×9): 40 mg via ORAL
  Filled 2016-02-21: qty 1
  Filled 2016-02-21: qty 2
  Filled 2016-02-21 (×2): qty 1
  Filled 2016-02-21: qty 2
  Filled 2016-02-21 (×4): qty 1

## 2016-02-21 MED ORDER — DOCUSATE SODIUM 100 MG PO CAPS
100.0000 mg | ORAL_CAPSULE | Freq: Two times a day (BID) | ORAL | Status: DC
  Administered 2016-02-22 – 2016-02-28 (×12): 100 mg via ORAL
  Filled 2016-02-21 (×12): qty 1

## 2016-02-21 MED ORDER — ACETAMINOPHEN 325 MG PO TABS
650.0000 mg | ORAL_TABLET | Freq: Four times a day (QID) | ORAL | Status: DC | PRN
  Filled 2016-02-21: qty 2

## 2016-02-21 MED ORDER — FENTANYL CITRATE (PF) 100 MCG/2ML IJ SOLN
50.0000 ug | Freq: Once | INTRAMUSCULAR | Status: AC
  Administered 2016-02-21: 50 ug via INTRAVENOUS
  Filled 2016-02-21: qty 2

## 2016-02-21 MED ORDER — INSULIN ASPART 100 UNIT/ML ~~LOC~~ SOLN
0.0000 [IU] | Freq: Every day | SUBCUTANEOUS | Status: DC
  Administered 2016-02-29: 2 [IU] via SUBCUTANEOUS

## 2016-02-21 MED ORDER — METRONIDAZOLE IN NACL 5-0.79 MG/ML-% IV SOLN
500.0000 mg | Freq: Three times a day (TID) | INTRAVENOUS | Status: DC
  Administered 2016-02-21: 500 mg via INTRAVENOUS
  Filled 2016-02-21: qty 100

## 2016-02-21 MED ORDER — SENNA 8.6 MG PO TABS
1.0000 | ORAL_TABLET | Freq: Two times a day (BID) | ORAL | Status: DC
  Administered 2016-02-22 – 2016-02-28 (×12): 8.6 mg via ORAL
  Filled 2016-02-21 (×12): qty 1

## 2016-02-21 MED ORDER — ENOXAPARIN SODIUM 40 MG/0.4ML ~~LOC~~ SOLN
40.0000 mg | SUBCUTANEOUS | Status: DC
  Administered 2016-02-22: 40 mg via SUBCUTANEOUS
  Filled 2016-02-21: qty 0.4

## 2016-02-21 MED ORDER — LORAZEPAM 2 MG/ML IJ SOLN
1.0000 mg | INTRAMUSCULAR | Status: DC | PRN
Start: 2016-02-21 — End: 2016-03-01
  Administered 2016-02-21: 1 mg via INTRAVENOUS
  Filled 2016-02-21: qty 1

## 2016-02-21 MED ORDER — ONDANSETRON HCL 4 MG/2ML IJ SOLN
4.0000 mg | Freq: Four times a day (QID) | INTRAMUSCULAR | Status: DC | PRN
  Administered 2016-02-27: 4 mg via INTRAVENOUS
  Filled 2016-02-21: qty 2

## 2016-02-21 MED ORDER — DEXTROSE 5 % IV SOLN
2.0000 g | Freq: Three times a day (TID) | INTRAVENOUS | Status: DC
  Filled 2016-02-21 (×2): qty 2

## 2016-02-21 MED ORDER — HYDROMORPHONE HCL 1 MG/ML IJ SOLN
0.5000 mg | Freq: Once | INTRAMUSCULAR | Status: AC
  Administered 2016-02-21: 0.5 mg via INTRAVENOUS
  Filled 2016-02-21: qty 1

## 2016-02-21 MED ORDER — SODIUM CHLORIDE 0.9 % IV SOLN: INTRAVENOUS | Status: DC

## 2016-02-21 MED ORDER — INSULIN ASPART 100 UNIT/ML ~~LOC~~ SOLN
0.0000 [IU] | Freq: Three times a day (TID) | SUBCUTANEOUS | Status: DC
  Administered 2016-02-22: 3 [IU] via SUBCUTANEOUS
  Administered 2016-02-22: 2 [IU] via SUBCUTANEOUS
  Administered 2016-02-24 (×2): 3 [IU] via SUBCUTANEOUS
  Administered 2016-02-25: 5 [IU] via SUBCUTANEOUS
  Administered 2016-02-27 – 2016-03-01 (×5): 1 [IU] via SUBCUTANEOUS

## 2016-02-21 MED ORDER — HYDROMORPHONE HCL 1 MG/ML IJ SOLN: 1.0000 mg | Freq: Once | INTRAMUSCULAR | Status: DC

## 2016-02-21 MED ORDER — VANCOMYCIN HCL IN DEXTROSE 1-5 GM/200ML-% IV SOLN
1000.0000 mg | Freq: Three times a day (TID) | INTRAVENOUS | Status: AC
  Administered 2016-02-22 – 2016-02-25 (×11): 1000 mg via INTRAVENOUS
  Filled 2016-02-21 (×12): qty 200

## 2016-02-21 MED ORDER — ACETAMINOPHEN 650 MG RE SUPP: 650.0000 mg | Freq: Four times a day (QID) | RECTAL | Status: DC | PRN

## 2016-02-21 MED ORDER — MORPHINE SULFATE (PF) 4 MG/ML IV SOLN
3.0000 mg | INTRAVENOUS | Status: DC | PRN
  Administered 2016-02-22: 3 mg via INTRAVENOUS
  Filled 2016-02-21: qty 1

## 2016-02-21 MED ORDER — ONDANSETRON HCL 4 MG PO TABS: 4.0000 mg | ORAL_TABLET | Freq: Four times a day (QID) | ORAL | Status: DC | PRN

## 2016-02-21 MED ORDER — VANCOMYCIN HCL IN DEXTROSE 1-5 GM/200ML-% IV SOLN
1000.0000 mg | Freq: Once | INTRAVENOUS | Status: AC
  Administered 2016-02-21: 1000 mg via INTRAVENOUS
  Filled 2016-02-21: qty 200

## 2016-02-21 MED ORDER — OXYCODONE HCL 5 MG PO TABS
5.0000 mg | ORAL_TABLET | ORAL | Status: DC | PRN
  Administered 2016-02-21 – 2016-02-28 (×23): 5 mg via ORAL
  Filled 2016-02-21 (×23): qty 1

## 2016-02-21 MED ORDER — PIPERACILLIN-TAZOBACTAM 3.375 G IVPB 30 MIN
3.3750 g | Freq: Once | INTRAVENOUS | Status: AC
  Administered 2016-02-21: 3.375 g via INTRAVENOUS
  Filled 2016-02-21: qty 50

## 2016-02-21 MED ORDER — INSULIN DETEMIR 100 UNIT/ML ~~LOC~~ SOLN
10.0000 [IU] | Freq: Every day | SUBCUTANEOUS | Status: DC
  Administered 2016-02-22 – 2016-02-24 (×3): 10 [IU] via SUBCUTANEOUS
  Filled 2016-02-21 (×6): qty 0.1

## 2016-02-21 MED ORDER — SODIUM CHLORIDE 0.9% FLUSH
10.0000 mL | INTRAVENOUS | Status: DC | PRN
  Administered 2016-02-22 – 2016-02-25 (×4): 10 mL
  Filled 2016-02-21 (×4): qty 40

## 2016-02-21 MED ORDER — POLYETHYLENE GLYCOL 3350 17 G PO PACK
17.0000 g | PACK | Freq: Every day | ORAL | Status: DC | PRN
  Filled 2016-02-21: qty 1

## 2016-02-21 MED ORDER — SODIUM CHLORIDE 0.9 % IV SOLN: INTRAVENOUS | Status: AC

## 2016-02-21 NOTE — Consult Note (Signed)
[facility] Nurse wound consult note Reason for Consult: diabetic foot ulcer Wound type:Dr.T. Percell Miller evaluated patient this afternoon, referred to Dr. Eather Colas who saw patient this evening (see notes).  Plan is for transtibial amputation on the left as soon as possible, continue IV antibiotics. No orders provided by either physician for local wound care. No apparent role for [facility] nurse intervention at this time; will defer to orthopedic POC. [facility] nursing team will not follow, but will remain available to this patient, the nursing and medical teams.  Please re-consult if needed. Thanks, Maudie Flakes, MSN, RN, [place], Arther Abbott  Pager# [phone]

## 2016-02-21 NOTE — ED Notes (Signed)
Called floor to start 20 minute timer

## 2016-02-21 NOTE — Progress Notes (Signed)
55 yr old bcbs pt with [facility] 1 ED visit and 1 admission on today for osteomyelitis CM consult for --Pt's daughter states pt will need a hospital bed, bedside commode, wheelchair, bathing supplies, and pullups upon discharge. She also is interested in home health rn possibly. Please provide available assistance  CM spoke with pt who at this time does not know his choice of home health agency Refers Cm to his daughter "I'll let her handle that. You can call her, Chantel" CM spoke with him about DME being offer through home health agencies Provided a list of home health agencies and private duty agencies for [employer] plus a list of choice connections resources and community health response program information for pt to give to pt Placed resources in pt belonging bag sitting on bedside chair Daughter not present

## 2016-02-21 NOTE — Consult Note (Signed)
ORTHOPAEDIC CONSULTATION  REQUESTING PHYSICIAN: Annita Brod, MD  Chief Complaint: Gangrene abscess chronic osteomyelitis and ulceration left lower extremity.  HPI: Donald Steele is a 55 y.o. 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 Patient states that he's been going to the wound clinic for 30 weeks for wound care. He states that recently he dropped some hot spaghetti on his foot causing the black eschar on the dorsum of his foot.  Of note patient is currently on chemotherapy for pancreatic cancer has diabetes and peripheral vascular disease.  Past Medical History:  Diagnosis Date  . 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 Cardiomyopathy ([place])    a. Echo 2/17 - EF 35-40%, mod diff HK, inf-septal, inf-lat AK, Gr 1 DD, mild MR  . Carotid stenosis    a. Carotid US 2/17 - bilat ICA 1-39%  . Chronic combined systolic and diastolic CHF (congestive heart failure) ([place])   . Coronary artery disease   . Diabetes mellitus   . Duodenal stenosis   . Generalized weakness   . History of nuclear stress test    a. Nuc study 2/17 - Myocardial perfusion is abnormal. Findings consistent with prior myocardial infarction (inf, inf-lat, apical inf, apical lateral) with peri-infarct ischemia in apical inferolateral and apical lateral regions. This is a high risk study. Overall left ventricular systolic function was abnormal. LV cavity size is severely enlarged. Nuclear stress EF: 29%.   . Hyperlipidemia   . Hypertension   . Peripheral vascular disease due to secondary diabetes mellitus ([place])    a. s/p R Fem-Tibial bypass 2/17   Past Surgical History:  Procedure Laterality Date  . CARDIAC CATHETERIZATION N/A 06/30/2015   Procedure: Left Heart Cath and Coronary Angiography;  Surgeon: Jettie Booze, MD;  Location: [facility];  Service: Cardiovascular;  Laterality: N/A;  . DUODENAL STENT PLACEMENT N/A 12/06/2015   Procedure:  DUODENAL STENT PLACEMENT;  Surgeon: Carol Ada, MD;  Location: [facility];  Service: Endoscopy;  Laterality: N/A;  . ENDARTERECTOMY FEMORAL Right 06/02/2015   Procedure: ENDARTERECTOMY RIGHT PROFUNDA FEMORIS ARTERY;  Surgeon: Mal Misty, MD;  Location: [facility];  Service: Vascular;  Laterality: Right;  . ERCP N/A 12/02/2015   Procedure: ENDOSCOPIC RETROGRADE CHOLANGIOPANCREATOGRAPHY (ERCP);  Surgeon: Carol Ada, MD;  Location: [facility];  Service: Endoscopy;  Laterality: N/A;  . ESOPHAGOGASTRODUODENOSCOPY (EGD) WITH PROPOFOL N/A 12/06/2015   Procedure: ESOPHAGOGASTRODUODENOSCOPY (EGD) WITH PROPOFOL;  Surgeon: Carol Ada, MD;  Location: [facility];  Service: Endoscopy;  Laterality: N/A;  . EUS N/A 12/06/2015   Procedure: UPPER ENDOSCOPIC ULTRASOUND (EUS) LINEAR;  Surgeon: Carol Ada, MD;  Location: [facility];  Service: Endoscopy;  Laterality: N/A;  . FEMORAL-TIBIAL BYPASS GRAFT Right 06/02/2015   Procedure: RIGHT PROFUNDA FEMORIS TO POSTERIOR TIBIAL ARTERY BYPASS USING RIGHT NON-REVERSED GREATER SAPHENOUS VEIN,  SUBCUTANEOUS TUNNEL;  Surgeon: Mal Misty, MD;  Location: [facility];  Service: Vascular;  Laterality: Right;  . INTRAOPERATIVE ARTERIOGRAM Right 06/02/2015   Procedure: INTRA OPERATIVE ARTERIOGRAM RIGHT LOWER LEG;  Surgeon: Mal Misty, MD;  Location: [facility];  Service: Vascular;  Laterality: Right;  . IR GENERIC HISTORICAL  12/03/2015   IR INT EXT BILIARY DRAIN WITH CHOLANGIOGRAM 12/03/2015 [name], MD [facility]  . IR GENERIC HISTORICAL  12/22/2015   IR BILIARY STENT(S) EXISTING ACCESS INC DILATION CATH EXCHANGE 12/22/2015 Corrie Mckusick, DO [facility]  . MULTIPLE TOOTH EXTRACTIONS    . No history of  Surgery     . PERIPHERAL VASCULAR CATHETERIZATION N/A 06/01/2015   Procedure: Abdominal Aortogram;  Surgeon: Serafina Mitchell, MD;  Location: [facility];  Service: Cardiovascular;  Laterality: N/A;  . PERIPHERAL VASCULAR CATHETERIZATION N/A 07/07/2015   Procedure:  Aortic Arch Angiography;  Surgeon: Conrad [place], MD;  Location: [facility];  Service: Cardiovascular;  Laterality: N/A;  . PERIPHERAL VASCULAR CATHETERIZATION N/A 07/07/2015   Procedure: Upper Extremity Angiography;  Surgeon: Conrad Pineland, MD;  Location: [facility];  Service: Cardiovascular;  Laterality: N/A;  . PERIPHERAL VASCULAR CATHETERIZATION Left 07/07/2015   Procedure: Peripheral Vascular Intervention;  Surgeon: Conrad Clayton, MD;  Location: [facility];  Service: Cardiovascular;  Laterality: Left;  left subclavian and axillary  . PORTACATH PLACEMENT N/A 12/29/2015   Procedure: INSERTION PORT-A-CATH;  Surgeon: [name], MD;  Location: [facility];  Service: General;  Laterality: N/A;  . VEIN HARVEST Right 06/02/2015   Procedure: VEIN HARVEST RIGHT GREATER SAPHENOUS VEIN;  Surgeon: Mal Misty, MD;  Location: [facility];  Service: Vascular;  Laterality: Right;   Social History   Social History  . Marital status: Single    Spouse name: N/A  . Number of children: N/A  . Years of education: N/A   Social History Main Topics  . Smoking status: Former Smoker    Packs/day: 1.00    Years: 35.00    Quit date: 05/18/2015  . Smokeless tobacco: Never Used  . Alcohol use No  . Drug use: No  . Sexual activity: Not Asked   Other Topics Concern  . None   Social History Narrative   Single, lives with girlfriend Donald Steele   Works in [employer]   Family History  Problem Relation Age of Onset  . Heart attack Neg Hx    - negative except otherwise stated in the family history section No Known Allergies Prior to Admission medications   Medication Sig Start Date End Date Taking? Authorizing Provider  amoxicillin-clavulanate (AUGMENTIN) 875-125 MG tablet Take 1 tablet by mouth 2 (two) times daily. 01/26/16  Yes Historical Provider, MD  aspirin 81 MG EC tablet Take 1 tablet (81 mg total) by mouth daily. Swallow whole. 07/15/15  Yes Camelia Eng Tysinger, PA-C    atorvastatin (LIPITOR) 40 MG tablet Take 1 tablet (40 mg total) by mouth daily. 07/14/15  Yes Liliane Shi, PA-C  Blood Glucose Monitoring Suppl (ONETOUCH VERIO) w/Device KIT 1 kit by Does not apply route daily. 02/15/16  Yes Camelia Eng Tysinger, PA-C  collagenase (SANTYL) ointment Apply topically daily. Apply to right foot wound daily. Place moist saline gauze on top and wrap foot daily. 06/07/15  Yes Alvia Grove, PA-C  glucose blood (ONETOUCH VERIO) test strip Test twice a day 02/15/16  Yes Camelia Eng Tysinger, PA-C  lidocaine-prilocaine (EMLA) cream Apply 1 application topically as needed. Apply to port site 1 hr prior to port access and cover with plastic wrap 01/02/16  Yes Ladell Pier, MD  MICROLET LANCETS MISC Test twice a day. Pt  Uses one touch verio meter 02/15/16  Yes Camelia Eng Tysinger, PA-C  morphine (MS CONTIN) 15 MG 12 hr tablet Take 1 tablet (15 mg total) by mouth every 12 (twelve) hours. Patient taking differently: Take 15 mg by mouth 2 (two) times daily as needed for pain.  01/02/16  Yes Ladell Pier, MD  NEEDLE, DISP, 30 G (BD DISP NEEDLES) 30G X 1/2" MISC 1 each by  Does not apply route at bedtime. 11/17/15  Yes Camelia Eng Tysinger, PA-C  ciprofloxacin (CIPRO) 500 MG tablet Take 1 tablet (500 mg total) by mouth 2 (two) times daily. Patient not taking: Reported on 02/21/2016 02/15/16   Camelia Eng Tysinger, PA-C  clopidogrel (PLAVIX) 75 MG tablet Take 1 tablet (75 mg total) by mouth daily. Patient not taking: Reported on 02/21/2016 07/15/15   Camelia Eng Tysinger, PA-C  doxycycline (VIBRAMYCIN) 100 MG capsule Take 1 capsule (100 mg total) by mouth 2 (two) times daily. Patient not taking: Reported on 02/21/2016 02/15/16   Camelia Eng Tysinger, PA-C  insulin degludec (TRESIBA) 100 UNIT/ML SOPN FlexTouch Pen Inject 0.17 mLs (17 Units total) into the skin daily at 10 pm. Patient not taking: Reported on 02/21/2016 02/15/16   Camelia Eng Tysinger, PA-C  insulin glargine (LANTUS) 100 UNIT/ML injection Inject 0.14 mLs  (14 Units total) into the skin at bedtime. Patient not taking: Reported on 02/21/2016 12/09/15   Debbe Odea, MD  insulin lispro (HUMALOG KWIKPEN) 100 UNIT/ML KiwkPen Inject 0.05 mLs (5 Units total) into the skin 3 (three) times daily. Patient not taking: Reported on 02/21/2016 02/15/16   Camelia Eng Tysinger, PA-C  insulin lispro (HUMALOG) 100 UNIT/ML injection Inject 5 Units into the skin 3 (three) times daily before meals. Take 5 units at mealtime.  If you skip a meal, then just take 2.5 unites.  This is in addition to the long acting pen at night.    Historical Provider, MD   Dg Tibia/fibula Left  Result Date: 02/21/2016 CLINICAL DATA:  Diabetic ulcer distal left tibia fibula EXAM: LEFT TIBIA AND FIBULA - 2 VIEW COMPARISON:  None. FINDINGS: Four views of the left tibia fibula submitted. There is soft tissue defect/ irregularity probable ulcer anterior tibial region. There is adjacent cortical irregularity and osteo lysis anterior aspect of the tibial shaft findings are highly suspicious for osteomyelitis. Further correlation with MRI is recommended. There is soft tissue air anterior to the distal ankle. Findings may represent gangrene or necrotizing fasciitis. IMPRESSION: There is soft tissue defect/ irregularity probable ulcer mid anterior tibial region. There is adjacent cortical irregularity and osteo lysis anterior aspect of the tibial shaft findings are highly suspicious for osteomyelitis. Further correlation with MRI is recommended. There is soft tissue air anterior to the distal ankle. Findings may represent gangrene or necrotizing fasciitis. These results were called by telephone at the time of interpretation on 02/21/2016 at 1:28 pm to Dr. Carmin Muskrat , who verbally acknowledged these results. Electronically Signed   By: Lahoma Crocker M.D.   On: 02/21/2016 13:28   Dg Foot 2 Views Left  Result Date: 02/21/2016 CLINICAL DATA:  Diabetes.  Ulceration left fifth toe. EXAM: DG C-ARM 61-120 MIN-NO REPORT;  LEFT FOOT - 2 VIEW COMPARISON:  11/22/2015. FINDINGS: Soft tissue swelling noted over the left fifth digit. Erosive changes noted the base of the middle phalanx and distal aspect of the proximal phalanx of the left fifth digit. These findings suggest septic arthritis with osteomyelitis. Soft tissue air is noted over the dorsum of the foot consistent with soft tissue infection/ gangrene. Peripheral vascular calcification. IMPRESSION: 1. Findings consistent with septic arthritis and adjacent osteomyelitis involving the proximal interphalangeal joint of the left fifth digit. 2. Soft tissue swelling with diffuse soft tissue air noted over the dorsum of the foot ankle consistent with soft tissue infection/gangrene. 3.  Peripheral vascular disease. These results will be called to the ordering clinician or representative by the Radiologist Assistant, and  communication documented in the PACS or zVision Dashboard. Electronically Signed   By: Marcello Moores  Register   On: 02/21/2016 13:07   - pertinent xrays, CT, MRI studies were reviewed and independently interpreted  Positive ROS: All other systems have been reviewed and were otherwise negative with the exception of those mentioned in the HPI and as above.  Physical Exam: General: Alert, no acute distress Psychiatric: Patient is competent for consent with normal mood and affect Lymphatic: No axillary or cervical lymphadenopathy Cardiovascular: No pedal edema Respiratory: No cyanosis, no use of accessory musculature GI: No organomegaly, abdomen is soft and non-tender  Skin: Patient has chronic gangrenous changes over the distal tibia. There is exposed tibia there is necrotic tendon. Patient has dry gangrenous changes over the dorsum of the foot.   Neurologic: Patient does not have protective sensation bilateral lower extremities.   MUSCULOSKELETAL:  Patient has a palpable dorsalis pedis pulse. There is odor elevated white cell count. Radiographs shows chronic  osteomyelitis at the junction of the middle and distal tibia  Assessment: Assessment: Diabetic insensate neuropathy with peripheral vascular disease with pancreatic cancer currently on chemotherapy with chronic osteomyelitis ulceration and gangrene of the left lower extremity  Plan: [place]: Plan for transtibial amputation on the left. We'll set this up as soon as possible continue IV antibiotics until surgery is scheduled. We will plan on surgery at [facility] or [name] depending on operating room availability.  Thank you for the consult and the opportunity to see [name], [place] [phone] 6:17 PM

## 2016-02-21 NOTE — ED Provider Notes (Addendum)
[facility] DEPT Provider Note   CSN: [id number] Arrival date & time: 02/21/16  1048     History   Chief Complaint Chief Complaint  Patient presents with  . Wound Check    HPI Donald Steele is a 55 y.o. male.  HPI  Patient with multiple medical issues including pancreatic cancer, chronic nonhealing wounds, vasculopathy presents from his wound care center after he went there for evaluation, was found to have progression of multiple lesions on his left lower extremity. Patient is here with his sister who assists with the history of present illness. He notes increasing pain in the area, throughout the left foot, left shin. Wounds have been present and some form for about 2 years, but he notes progression of the past few days and weeks, including new drainage. Pain is severe, sore. No new fever, no chills, no nausea, no vomiting.   Past Medical History:  Diagnosis Date  . 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 Cardiomyopathy ([place])    a. Echo 2/17 - EF 35-40%, mod diff HK, inf-septal, inf-lat AK, Gr 1 DD, mild MR  . Carotid stenosis    a. Carotid US 2/17 - bilat ICA 1-39%  . Chronic combined systolic and diastolic CHF (congestive heart failure) ([place])   . Coronary artery disease   . Diabetes mellitus   . Duodenal stenosis   . Generalized weakness   . History of nuclear stress test    a. Nuc study 2/17 - Myocardial perfusion is abnormal. Findings consistent with prior myocardial infarction (inf, inf-lat, apical inf, apical lateral) with peri-infarct ischemia in apical inferolateral and apical lateral regions. This is a high risk study. Overall left ventricular systolic function was abnormal. LV cavity size is severely enlarged. Nuclear stress EF: 29%.   . Hyperlipidemia   . Hypertension   . Peripheral vascular disease due to secondary diabetes mellitus ([place])    a. s/p R Fem-Tibial bypass 2/17    Patient Active Problem List   Diagnosis Date Noted  .  Port catheter in place 01/09/2016  . Cancer of head of pancreas ([place]) 12/16/2015  . Adenocarcinoma ([place]) 12/14/2015  . Liver enzyme elevation 12/14/2015  . Pancreatic insufficiency 12/09/2015  . 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 Obstructive jaundice due to malignant neoplasm ([place]) 12/01/2015  . Pancreatic mass 12/01/2015  . Obstructive jaundice 12/01/2015  . Former smoker 11/15/2015  . Vaccine counseling 11/15/2015  . Tooth decay 11/15/2015  . Ischemic cardiomyopathy 07/14/2015  . CAD (coronary artery disease) 07/14/2015  . Hypertensive heart disease 07/14/2015  . HLD (hyperlipidemia) 07/14/2015  . Subclavian artery stenosis, left ([place]) 07/07/2015  . Abnormal stress test   . Ischemic finger 06/28/2015  . Chronic combined systolic and diastolic CHF (congestive heart failure) ([place])   . 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, unspecified extremity ([place]) 06/01/2015  . Preop cardiovascular exam 06/01/2015  . Carotid bruit 06/01/2015  . Nonhealing ulcer of right lower extremity ([place])   . DM (diabetes mellitus), type 2 with peripheral vascular complications ([place]) 54/62/7035  . Femoral bruit 05/31/2015  . Skin breakdown 05/31/2015  . Leg ulcer, left ([place]) 05/31/2015  . Type 2 diabetes mellitus with diabetic neuropathy, without long-term current use of insulin ([place]) 05/31/2015  . Right foot pain 05/31/2015  . PAD (peripheral artery disease) ([place]) 05/31/2015    Past Surgical History:  Procedure Laterality Date  . CARDIAC CATHETERIZATION N/A 06/30/2015   Procedure: Left Heart Cath and Coronary Angiography;  Surgeon: Conception Oms  Hassell Done, MD;  Location: [facility];  Service: Cardiovascular;  Laterality: N/A;  . DUODENAL STENT PLACEMENT N/A 12/06/2015   Procedure: DUODENAL STENT PLACEMENT;  Surgeon: Carol Ada, MD;  Location: [facility];  Service: Endoscopy;  Laterality: N/A;  . ENDARTERECTOMY FEMORAL Right 06/02/2015   Procedure: ENDARTERECTOMY RIGHT  PROFUNDA FEMORIS ARTERY;  Surgeon: Mal Misty, MD;  Location: [facility];  Service: Vascular;  Laterality: Right;  . ERCP N/A 12/02/2015   Procedure: ENDOSCOPIC RETROGRADE CHOLANGIOPANCREATOGRAPHY (ERCP);  Surgeon: Carol Ada, MD;  Location: [facility];  Service: Endoscopy;  Laterality: N/A;  . ESOPHAGOGASTRODUODENOSCOPY (EGD) WITH PROPOFOL N/A 12/06/2015   Procedure: ESOPHAGOGASTRODUODENOSCOPY (EGD) WITH PROPOFOL;  Surgeon: Carol Ada, MD;  Location: [facility];  Service: Endoscopy;  Laterality: N/A;  . EUS N/A 12/06/2015   Procedure: UPPER ENDOSCOPIC ULTRASOUND (EUS) LINEAR;  Surgeon: Carol Ada, MD;  Location: [facility];  Service: Endoscopy;  Laterality: N/A;  . FEMORAL-TIBIAL BYPASS GRAFT Right 06/02/2015   Procedure: RIGHT PROFUNDA FEMORIS TO POSTERIOR TIBIAL ARTERY BYPASS USING RIGHT NON-REVERSED GREATER SAPHENOUS VEIN,  SUBCUTANEOUS TUNNEL;  Surgeon: Mal Misty, MD;  Location: [facility];  Service: Vascular;  Laterality: Right;  . INTRAOPERATIVE ARTERIOGRAM Right 06/02/2015   Procedure: INTRA OPERATIVE ARTERIOGRAM RIGHT LOWER LEG;  Surgeon: Mal Misty, MD;  Location: [facility];  Service: Vascular;  Laterality: Right;  . IR GENERIC HISTORICAL  12/03/2015   IR INT EXT BILIARY DRAIN WITH CHOLANGIOGRAM 12/03/2015 [name], MD [facility]  . IR GENERIC HISTORICAL  12/22/2015   IR BILIARY STENT(S) EXISTING ACCESS INC DILATION CATH EXCHANGE 12/22/2015 Corrie Mckusick, DO [facility]  . MULTIPLE TOOTH EXTRACTIONS    . No history of Surgery     . PERIPHERAL VASCULAR CATHETERIZATION N/A 06/01/2015   Procedure: Abdominal Aortogram;  Surgeon: Serafina Mitchell, MD;  Location: [facility];  Service: Cardiovascular;  Laterality: N/A;  . PERIPHERAL VASCULAR CATHETERIZATION N/A 07/07/2015   Procedure: Aortic Arch Angiography;  Surgeon: Conrad Waianae, MD;  Location: [facility];  Service: Cardiovascular;  Laterality: N/A;  . PERIPHERAL VASCULAR CATHETERIZATION N/A 07/07/2015   Procedure:  Upper Extremity Angiography;  Surgeon: Conrad [place], MD;  Location: [facility];  Service: Cardiovascular;  Laterality: N/A;  . PERIPHERAL VASCULAR CATHETERIZATION Left 07/07/2015   Procedure: Peripheral Vascular Intervention;  Surgeon: Conrad Crystal, MD;  Location: [facility];  Service: Cardiovascular;  Laterality: Left;  left subclavian and axillary  . PORTACATH PLACEMENT N/A 12/29/2015   Procedure: INSERTION PORT-A-CATH;  Surgeon: [name], MD;  Location: [facility];  Service: General;  Laterality: N/A;  . VEIN HARVEST Right 06/02/2015   Procedure: VEIN HARVEST RIGHT GREATER SAPHENOUS VEIN;  Surgeon: Mal Misty, MD;  Location: [facility];  Service: Vascular;  Laterality: Right;       Home Medications    Prior to Admission medications   Medication Sig Start Date End Date Taking? Authorizing Provider  amoxicillin-clavulanate (AUGMENTIN) 875-125 MG tablet Take 1 tablet by mouth 2 (two) times daily. 01/26/16   Historical Provider, MD  aspirin 81 MG EC tablet Take 1 tablet (81 mg total) by mouth daily. Swallow whole. 07/15/15   Camelia Eng Tysinger, PA-C  atorvastatin (LIPITOR) 40 MG tablet Take 1 tablet (40 mg total) by mouth daily. 07/14/15   Liliane Shi, PA-C  Blood Glucose Monitoring Suppl (ONETOUCH VERIO) w/Device KIT 1 kit by Does not apply route daily. 02/15/16   Camelia Eng Tysinger, PA-C  ciprofloxacin (  CIPRO) 500 MG tablet Take 1 tablet (500 mg total) by mouth 2 (two) times daily. 02/15/16   Camelia Eng Tysinger, PA-C  clopidogrel (PLAVIX) 75 MG tablet Take 1 tablet (75 mg total) by mouth daily. Patient not taking: Reported on 02/15/2016 07/15/15   Camelia Eng Tysinger, PA-C  collagenase (SANTYL) ointment Apply topically daily. Apply to right foot wound daily. Place moist saline gauze on top and wrap foot daily. 06/07/15   Alvia Grove, PA-C  doxycycline (VIBRAMYCIN) 100 MG capsule Take 1 capsule (100 mg total) by mouth 2 (two) times daily. 02/15/16   Camelia Eng Tysinger, PA-C  glucose blood  [facility] VERIO) test strip Test twice a day 02/15/16   Camelia Eng Tysinger, PA-C  Insulin Degludec (TRESIBA FLEXTOUCH [place]) Inject 15 Units into the skin at bedtime.    Historical Provider, MD  insulin degludec (TRESIBA) 100 UNIT/ML SOPN FlexTouch Pen Inject 0.17 mLs (17 Units total) into the skin daily at 10 pm. 02/15/16   Camelia Eng Tysinger, PA-C  insulin glargine (LANTUS) 100 UNIT/ML injection Inject 0.14 mLs (14 Units total) into the skin at bedtime. Patient not taking: Reported on 02/06/2016 12/09/15   Debbe Odea, MD  insulin lispro (HUMALOG KWIKPEN) 100 UNIT/ML KiwkPen Inject 0.05 mLs (5 Units total) into the skin 3 (three) times daily. 02/15/16   Camelia Eng Tysinger, PA-C  insulin lispro (HUMALOG) 100 UNIT/ML injection Inject 5 Units into the skin 3 (three) times daily before meals. Take 5 units at mealtime.  If you skip a meal, then just take 2.5 unites.  This is in addition to the long acting pen at night.    Historical Provider, MD  lidocaine-prilocaine (EMLA) cream Apply 1 application topically as needed. Apply to port site 1 hr prior to port access and cover with plastic wrap 01/02/16   Ladell Pier, MD  MICROLET LANCETS MISC Test twice a day. Pt  Uses one touch verio meter 02/15/16   Camelia Eng Tysinger, PA-C  morphine (MS CONTIN) 15 MG 12 hr tablet Take 1 tablet (15 mg total) by mouth every 12 (twelve) hours. 01/02/16   Ladell Pier, MD  NEEDLE, DISP, 30 G (BD DISP NEEDLES) 30G X 1/2" MISC 1 each by Does not apply route at bedtime. 11/17/15   Camelia Eng Tysinger, PA-C  VITAMIN A PO Take 1 tablet by mouth daily.    Historical Provider, MD    Family History Family History  Problem Relation Age of Onset  . Heart attack Neg Hx     Social History Social History  Substance Use Topics  . Smoking status: Former Smoker    Packs/day: 1.00    Years: 35.00    Quit date: 05/18/2015  . Smokeless tobacco: Never Used  . Alcohol use No     Allergies   Patient has no known allergies.   Review of  Systems Review of Systems  Constitutional:       Per HPI, otherwise negative  HENT:       Per HPI, otherwise negative  Respiratory:       Per HPI, otherwise negative  Cardiovascular:       Per HPI, otherwise negative  Gastrointestinal: Negative for vomiting.  Endocrine:       Negative aside from HPI  Genitourinary:       Neg aside from HPI   Musculoskeletal:       Per HPI, otherwise negative  Skin: Positive for color change and wound.  Allergic/Immunologic: Positive for immunocompromised state.  Neurological: Negative for syncope.  Hematological: Bruises/bleeds easily.     Physical Exam Updated Vital Signs BP 136/79 (BP Location: Right Arm)   Pulse 108   Temp 98.4 F (36.9 C) (Oral)   Resp 18   Ht 5' 11"  (1.803 m)   Wt 179 lb (81.2 kg)   SpO2 98%   BMI 24.97 kg/m   Physical Exam  Constitutional: He is oriented to person, place, and time. He appears well-developed. No distress.  HENT:  Head: Normocephalic and atraumatic.  Eyes: Conjunctivae and EOM are normal.  Cardiovascular: Normal rate and regular rhythm.   Pulmonary/Chest: Effort normal. No stridor. No respiratory distress.  Abdominal: He exhibits no distension.  Musculoskeletal: He exhibits no edema.       Feet:  Neurological: He is alert and oriented to person, place, and time.  Skin: Skin is warm and dry.     Multiple wounds on L LE. Most prominent one is on the anterior shin approximately 10 cm x 8 cm with visible tendons, soft tissue, purulent drainage Surrounding this there is erythema, tracking inferiorly and superiorly. There is darkened, necrotic skin on the junction of the foot and tibia  Psychiatric: He has a normal mood and affect.  Nursing note and vitals reviewed.    ED Treatments / Results  Labs (all labs ordered are listed, but only abnormal results are displayed) Labs Reviewed  COMPREHENSIVE METABOLIC PANEL - Abnormal; Notable for the following:       Result Value   Sodium 130 (*)     Potassium 3.3 (*)    Chloride 94 (*)    Glucose, Bld 266 (*)    Calcium 8.4 (*)    Albumin 2.5 (*)    AST 13 (*)    ALT 10 (*)    Alkaline Phosphatase 133 (*)    All other components within normal limits  CBC WITH DIFFERENTIAL/PLATELET - Abnormal; Notable for the following:    WBC 23.3 (*)    RBC 3.51 (*)    Hemoglobin 8.8 (*)    HCT 27.5 (*)    MCH 25.1 (*)    RDW 15.8 (*)    Platelets 581 (*)    Neutro Abs 18.4 (*)    Monocytes Absolute 3.1 (*)    All other components within normal limits  CULTURE, BLOOD (ROUTINE X 2)  CULTURE, BLOOD (ROUTINE X 2)  LIPASE, BLOOD  I-STAT CG4 LACTIC ACID, ED    Patient has his wound care documentation with him. Notable for description of multiple old dermal depth wounds. Patient had new dressing applied today.  Radiology Dg Tibia/fibula Left  Result Date: 02/21/2016 CLINICAL DATA:  Diabetic ulcer distal left tibia fibula EXAM: LEFT TIBIA AND FIBULA - 2 VIEW COMPARISON:  None. FINDINGS: Four views of the left tibia fibula submitted. There is soft tissue defect/ irregularity probable ulcer anterior tibial region. There is adjacent cortical irregularity and osteo lysis anterior aspect of the tibial shaft findings are highly suspicious for osteomyelitis. Further correlation with MRI is recommended. There is soft tissue air anterior to the distal ankle. Findings may represent gangrene or necrotizing fasciitis. IMPRESSION: There is soft tissue defect/ irregularity probable ulcer mid anterior tibial region. There is adjacent cortical irregularity and osteo lysis anterior aspect of the tibial shaft findings are highly suspicious for osteomyelitis. Further correlation with MRI is recommended. There is soft tissue air anterior to the distal ankle. Findings may represent gangrene or necrotizing fasciitis. These results were called by telephone at the  time of interpretation on 02/21/2016 at 1:28 pm to Dr. Carmin Muskrat , who verbally acknowledged these  results. Electronically Signed   By: Lahoma Crocker M.D.   On: 02/21/2016 13:28   Dg Foot 2 Views Left  Result Date: 02/21/2016 CLINICAL DATA:  Diabetes.  Ulceration left fifth toe. EXAM: DG C-ARM 61-120 MIN-NO REPORT; LEFT FOOT - 2 VIEW COMPARISON:  11/22/2015. FINDINGS: Soft tissue swelling noted over the left fifth digit. Erosive changes noted the base of the middle phalanx and distal aspect of the proximal phalanx of the left fifth digit. These findings suggest septic arthritis with osteomyelitis. Soft tissue air is noted over the dorsum of the foot consistent with soft tissue infection/ gangrene. Peripheral vascular calcification. IMPRESSION: 1. Findings consistent with septic arthritis and adjacent osteomyelitis involving the proximal interphalangeal joint of the left fifth digit. 2. Soft tissue swelling with diffuse soft tissue air noted over the dorsum of the foot ankle consistent with soft tissue infection/gangrene. 3.  Peripheral vascular disease. These results will be called to the ordering clinician or representative by the Radiologist Assistant, and communication documented in the PACS or zVision Dashboard. Electronically Signed   By: Marcello Moores  Register   On: 02/21/2016 13:07  I discussed the patient's x-ray findings with our radiologist. MRI pending.   Procedures Procedures (including critical care time)  Medications Ordered in ED Medications  LORazepam (ATIVAN) injection 1 mg (not administered)  vancomycin (VANCOCIN) IVPB 1000 mg/200 mL premix (not administered)  fentaNYL (SUBLIMAZE) injection 50 mcg (50 mcg Intravenous Given 02/21/16 1335)  piperacillin-tazobactam (ZOSYN) IVPB 3.375 g (0 g Intravenous Stopped 02/21/16 1435)  vancomycin (VANCOCIN) IVPB 1000 mg/200 mL premix (1,000 mg Intravenous New Bag/Given 02/21/16 1435)    On repeat exam the patient is in similar condition. I have now discussed patient's case with our orthopedic colleague (Dr. Percell Miller), and a possible surgical  evaluation tomorrow, but with his multiple medical problems, including pancreatic cancer, nonhealing wounds, concern for cellulitis, osteomyelitis, he will be admitted to the hospitalist team. Dr. Sharol Given will evaluate the patient as well.  Initial Impression / Assessment and Plan / ED Course  I have reviewed the triage vital signs and the nursing notes.  Pertinent labs & imaging results that were available during my care of the patient were reviewed by me and considered in my medical decision making (see chart for details).  Clinical Course     Patient with multiple medical issues including pancreatic cancer, nonhealing left lower extremity wounds presents with pain and drainage from these wounds, from the wound care center. Here the patient is awake, alert, has notable infection throughout the distal left lower extremity, with the most prominent wound eroding through soft tissue with tendons visible. Given concern for osteomyelitis, deep space infection the patient x-ray, with planned MRI. Patient received empiric broad-spectrum antibiotic coverage, Zosyn, vancomycin. After discussion with our orthopedic team, patient is related to the hospitalist team     Carmin Muskrat, MD 02/21/16 1554  CRITICAL CARE Performed by: Carmin Muskrat Total critical care time: 45 minutes Critical care time was exclusive of separately billable procedures and treating other patients. Critical care was necessary to treat or prevent imminent or life-threatening deterioration. Critical care was time spent personally by me on the following activities: development of treatment plan with patient and/or surrogate as well as nursing, discussions with consultants, evaluation of patient's response to treatment, examination of patient, obtaining history from patient or surrogate, ordering and performing treatments and interventions, ordering and review of laboratory  studies, ordering and review of radiographic studies,  pulse oximetry and re-evaluation of patient's condition.     Carmin Muskrat, MD 02/21/16 [phone]

## 2016-02-21 NOTE — H&P (Signed)
History and Physical  Donald Steele ERX:[id number] DOB: 1960-08-16 DOA: 02/21/2016  Referring physician: Carmin Muskrat, ER physician  PCP: Crisoforo Oxford, PA-C  Outpatient Specialists: Meridee Score, orthopedic surgery Patient coming from: Home & is able to ambulate without assistance  Chief Complaint: Worsening leg ulcer   HPI: Donald Steele is a 55 y.o. male with medical history significant of uncontrolled diabetes mellitus with secondary peripheral vascular disease with chronic left lower extremity foot ulcer that has been managed by wound care. Patient states a few days ago, he dropped a plate of hot spaghetti on the wound. He was seen today wound care which noted worsening ulcerations of his send him over to the emergency room.   ED Course: The emergency room, patient was noted to have a white count of 23.3 one week ago was normal. He is also noted to have an elevated platelet count of 581. Lactic acid level was normal at 1.12. Tib-fib fib x-rays done noted soft tissue defect and irregularity with adjacent cortical irregularity and ostial lysis concerning for ulcer with secondary osteomyelitis as well as some soft tissue air represented possibly gangrene and necrotizing fasciitis. Foot x-rays noted signs consistent with septic arthritis, adjacent osteomyelitis involving the PIP joint of left fifth digit. Orthopedics were notified who after evaluating patient ordered MRI and plan for transmetatarsal agitation, possibly tomorrow. In the meantime, patient will need IV antibiotics and hospitalist was called for evaluation and admission.  Review of Systems: Patient seen after arrival to floor . Pt complains of some foot pain, although it has improved after getting pain medication   Pt denies any headaches, vision changes, dysphagia, chest pain, palpitations, short of breath, wheeze, cough, abdominal pain, hematuria, dysuria, constipation, diarrhea, focal extremity numbness weakness or  pain other than described above .  Review of systems are otherwise negative   Past Medical History:  Diagnosis Date  . 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 Cardiomyopathy ([place])    a. Echo 2/17 - EF 35-40%, mod diff HK, inf-septal, inf-lat AK, Gr 1 DD, mild MR  . Carotid stenosis    a. Carotid US 2/17 - bilat ICA 1-39%  . Chronic combined systolic and diastolic CHF (congestive heart failure) ([place])   . Coronary artery disease   . Diabetes mellitus   . Duodenal stenosis   . Generalized weakness   . History of nuclear stress test    a. Nuc study 2/17 - Myocardial perfusion is abnormal. Findings consistent with prior myocardial infarction (inf, inf-lat, apical inf, apical lateral) with peri-infarct ischemia in apical inferolateral and apical lateral regions. This is a high risk study. Overall left ventricular systolic function was abnormal. LV cavity size is severely enlarged. Nuclear stress EF: 29%.   . Hyperlipidemia   . Hypertension   . Peripheral vascular disease due to secondary diabetes mellitus ([place])    a. s/p R Fem-Tibial bypass 2/17   Past Surgical History:  Procedure Laterality Date  . CARDIAC CATHETERIZATION N/A 06/30/2015   Procedure: Left Heart Cath and Coronary Angiography;  Surgeon: Jettie Booze, MD;  Location: [place] [facility];  Service: Cardiovascular;  Laterality: N/A;  . DUODENAL STENT PLACEMENT N/A 12/06/2015   Procedure: DUODENAL STENT PLACEMENT;  Surgeon: Carol Ada, MD;  Location: [facility];  Service: Endoscopy;  Laterality: N/A;  . ENDARTERECTOMY FEMORAL Right 06/02/2015   Procedure: ENDARTERECTOMY RIGHT PROFUNDA FEMORIS ARTERY;  Surgeon: Mal Misty, MD;  Location: [facility];  Service: Vascular;  Laterality: Right;  .  ERCP N/A 12/02/2015   Procedure: ENDOSCOPIC RETROGRADE CHOLANGIOPANCREATOGRAPHY (ERCP);  Surgeon: Carol Ada, MD;  Location: [facility];  Service: Endoscopy;  Laterality: N/A;  . ESOPHAGOGASTRODUODENOSCOPY (EGD) WITH  PROPOFOL N/A 12/06/2015   Procedure: ESOPHAGOGASTRODUODENOSCOPY (EGD) WITH PROPOFOL;  Surgeon: Carol Ada, MD;  Location: [facility];  Service: Endoscopy;  Laterality: N/A;  . EUS N/A 12/06/2015   Procedure: UPPER ENDOSCOPIC ULTRASOUND (EUS) LINEAR;  Surgeon: Carol Ada, MD;  Location: [facility];  Service: Endoscopy;  Laterality: N/A;  . FEMORAL-TIBIAL BYPASS GRAFT Right 06/02/2015   Procedure: RIGHT PROFUNDA FEMORIS TO POSTERIOR TIBIAL ARTERY BYPASS USING RIGHT NON-REVERSED GREATER SAPHENOUS VEIN,  SUBCUTANEOUS TUNNEL;  Surgeon: Mal Misty, MD;  Location: [facility];  Service: Vascular;  Laterality: Right;  . INTRAOPERATIVE ARTERIOGRAM Right 06/02/2015   Procedure: INTRA OPERATIVE ARTERIOGRAM RIGHT LOWER LEG;  Surgeon: Mal Misty, MD;  Location: [facility];  Service: Vascular;  Laterality: Right;  . IR GENERIC HISTORICAL  12/03/2015   IR INT EXT BILIARY DRAIN WITH CHOLANGIOGRAM 12/03/2015 [name], MD [facility]  . IR GENERIC HISTORICAL  12/22/2015   IR BILIARY STENT(S) EXISTING ACCESS INC DILATION CATH EXCHANGE 12/22/2015 Corrie Mckusick, DO [facility]  . MULTIPLE TOOTH EXTRACTIONS    . No history of Surgery     . PERIPHERAL VASCULAR CATHETERIZATION N/A 06/01/2015   Procedure: Abdominal Aortogram;  Surgeon: Serafina Mitchell, MD;  Location: [place] [facility];  Service: Cardiovascular;  Laterality: N/A;  . PERIPHERAL VASCULAR CATHETERIZATION N/A 07/07/2015   Procedure: Aortic Arch Angiography;  Surgeon: Conrad Toston, MD;  Location: [place] [facility];  Service: Cardiovascular;  Laterality: N/A;  . PERIPHERAL VASCULAR CATHETERIZATION N/A 07/07/2015   Procedure: Upper Extremity Angiography;  Surgeon: Conrad Bloomville, MD;  Location: [place] [facility];  Service: Cardiovascular;  Laterality: N/A;  . PERIPHERAL VASCULAR CATHETERIZATION Left 07/07/2015   Procedure: Peripheral Vascular Intervention;  Surgeon: Conrad , MD;  Location: [facility];  Service: Cardiovascular;  Laterality: Left;   left subclavian and axillary  . PORTACATH PLACEMENT N/A 12/29/2015   Procedure: INSERTION PORT-A-CATH;  Surgeon: [name], MD;  Location: [facility];  Service: General;  Laterality: N/A;  . VEIN HARVEST Right 06/02/2015   Procedure: VEIN HARVEST RIGHT GREATER SAPHENOUS VEIN;  Surgeon: Mal Misty, MD;  Location: [facility];  Service: Vascular;  Laterality: Right;    Social History:  reports that he quit smoking about 9 months ago. He has a 35.00 pack-year smoking history. He has never used smokeless tobacco. He reports that he does not drink alcohol or use drugs.   No Known Allergies  Family History  Problem Relation Age of Onset  . Heart attack Neg Hx       Prior to Admission medications   Medication Sig Start Date End Date Taking? Authorizing Provider  aspirin 81 MG EC tablet Take 1 tablet (81 mg total) by mouth daily. Swallow whole. 07/15/15  Yes Camelia Eng Tysinger, PA-C  atorvastatin (LIPITOR) 40 MG tablet Take 1 tablet (40 mg total) by mouth daily. 07/14/15  Yes Liliane Shi, PA-C  Blood Glucose Monitoring Suppl (ONETOUCH VERIO) w/Device KIT 1 kit by Does not apply route daily. 02/15/16  Yes Camelia Eng Tysinger, PA-C  collagenase (SANTYL) ointment Apply topically daily. Apply to right foot wound daily. Place moist saline gauze on top and wrap foot daily. 06/07/15  Yes Alvia Grove, PA-C  glucose blood (ONETOUCH VERIO) test strip Test twice a day 02/15/16  Yes  Camelia Eng Tysinger, PA-C  lidocaine-prilocaine (EMLA) cream Apply 1 application topically as needed. Apply to port site 1 hr prior to port access and cover with plastic wrap 01/02/16  Yes Ladell Pier, MD  MICROLET LANCETS MISC Test twice a day. Pt  Uses one touch verio meter 02/15/16  Yes Camelia Eng Tysinger, PA-C  morphine (MS CONTIN) 15 MG 12 hr tablet Take 1 tablet (15 mg total) by mouth every 12 (twelve) hours. Patient taking differently: Take 15 mg by mouth 2 (two) times daily as needed for pain.  01/02/16  Yes Ladell Pier, MD    NEEDLE, DISP, 30 G (BD DISP NEEDLES) 30G X 1/2" MISC 1 each by Does not apply route at bedtime. 11/17/15  Yes Camelia Eng Tysinger, PA-C  clopidogrel (PLAVIX) 75 MG tablet Take 1 tablet (75 mg total) by mouth daily. Patient not taking: Reported on 02/21/2016 07/15/15   Camelia Eng Tysinger, PA-C  insulin degludec (TRESIBA) 100 UNIT/ML SOPN FlexTouch Pen Inject 0.17 mLs (17 Units total) into the skin daily at 10 pm. Patient not taking: Reported on 02/21/2016 02/15/16   Camelia Eng Tysinger, PA-C  insulin glargine (LANTUS) 100 UNIT/ML injection Inject 0.14 mLs (14 Units total) into the skin at bedtime. Patient not taking: Reported on 02/21/2016 12/09/15   Debbe Odea, MD  insulin lispro (HUMALOG KWIKPEN) 100 UNIT/ML KiwkPen Inject 0.05 mLs (5 Units total) into the skin 3 (three) times daily. Patient not taking: Reported on 02/21/2016 02/15/16   Camelia Eng Tysinger, PA-C  insulin lispro (HUMALOG) 100 UNIT/ML injection Inject 5 Units into the skin 3 (three) times daily before meals. Take 5 units at mealtime.  If you skip a meal, then just take 2.5 unites.  This is in addition to the long acting pen at night.    Historical Provider, MD    Physical Exam: BP 130/67 (BP Location: Right Arm)   Pulse 99   Temp 98.9 F (37.2 C) (Oral)   Resp 15   Ht _0  (1.803 m)   Wt 78.9 kg (174 lb)   SpO2 98%   BMI 24.27 kg/m   General:  Alert and oriented 3, no acute distress  Eyes: Sclera nonicteric, tracking movements are intact  ENT: Normocephalic, atraumatic, mucous movements are slightly dry  Neck: Supple, no JVD  Cardiovascular: Regular rate and rhythm, S1-S2  Respiratory: Clear to auscultation bilaterally  Abdomen: Soft, nontender, nondistended, positive bowel sounds  Skin: Left foot wrapped with foul-smelling odor  Musculoskeletal: Poor peripheral pulses, otherwise no clubbing or edema  Psychiatric:   Patient is appropriate, no evidence of psychoses Neurologic: no focal deficits           Labs on Admission:   Basic Metabolic Panel:  Recent Labs Lab 02/21/16 1226  NA 130*  K 3.3*  CL 94*  CO2 28  GLUCOSE 266*  BUN 7  CREATININE 0.74  CALCIUM 8.4*   Liver Function Tests:  Recent Labs Lab 02/21/16 1226  AST 13*  ALT 10*  ALKPHOS 133*  BILITOT 0.8  PROT 7.0  ALBUMIN 2.5*    Recent Labs Lab 02/21/16 1226  LIPASE 11   No results for input(s): AMMONIA in the last 168 hours. CBC:  Recent Labs Lab 02/21/16 1226  WBC 23.3*  NEUTROABS 18.4*  HGB 8.8*  HCT 27.5*  MCV 78.3  PLT 581*   Cardiac Enzymes: No results for input(s): CKTOTAL, CKMB, CKMBINDEX, TROPONINI in the last 168 hours.  BNP (last 3 results) No results for  input(s): BNP in the last 8760 hours.  ProBNP (last 3 results) No results for input(s): PROBNP in the last 8760 hours.  CBG: No results for input(s): GLUCAP in the last 168 hours.  Radiological Exams on Admission: Dg Tibia/fibula Left  Result Date: 02/21/2016 CLINICAL DATA:  Diabetic ulcer distal left tibia fibula EXAM: LEFT TIBIA AND FIBULA - 2 VIEW COMPARISON:  None. FINDINGS: Four views of the left tibia fibula submitted. There is soft tissue defect/ irregularity probable ulcer anterior tibial region. There is adjacent cortical irregularity and osteo lysis anterior aspect of the tibial shaft findings are highly suspicious for osteomyelitis. Further correlation with MRI is recommended. There is soft tissue air anterior to the distal ankle. Findings may represent gangrene or necrotizing fasciitis. IMPRESSION: There is soft tissue defect/ irregularity probable ulcer mid anterior tibial region. There is adjacent cortical irregularity and osteo lysis anterior aspect of the tibial shaft findings are highly suspicious for osteomyelitis. Further correlation with MRI is recommended. There is soft tissue air anterior to the distal ankle. Findings may represent gangrene or necrotizing fasciitis. These results were called by telephone at the time of interpretation  on 02/21/2016 at 1:28 pm to Dr. Carmin Muskrat , who verbally acknowledged these results. Electronically Signed   By: Lahoma Crocker M.D.   On: 02/21/2016 13:28   Dg Foot 2 Views Left  Result Date: 02/21/2016 CLINICAL DATA:  Diabetes.  Ulceration left fifth toe. EXAM: DG C-ARM 61-120 MIN-NO REPORT; LEFT FOOT - 2 VIEW COMPARISON:  11/22/2015. FINDINGS: Soft tissue swelling noted over the left fifth digit. Erosive changes noted the base of the middle phalanx and distal aspect of the proximal phalanx of the left fifth digit. These findings suggest septic arthritis with osteomyelitis. Soft tissue air is noted over the dorsum of the foot consistent with soft tissue infection/ gangrene. Peripheral vascular calcification. IMPRESSION: 1. Findings consistent with septic arthritis and adjacent osteomyelitis involving the proximal interphalangeal joint of the left fifth digit. 2. Soft tissue swelling with diffuse soft tissue air noted over the dorsum of the foot ankle consistent with soft tissue infection/gangrene. 3.  Peripheral vascular disease. These results will be called to the ordering clinician or representative by the Radiologist Assistant, and communication documented in the PACS or zVision Dashboard. Electronically Signed   By: Marcello Moores  Register   On: 02/21/2016 13:07    EKG: Not done  Assessment/Plan Present on Admission: . Osteomyelitis of ankle and foot ([place]): Orthopedics, Dr.Duda,  plans to check MRI and then take patient for transmetatarsal amputation. In the meantime, cover wound with IV Zosyn and vancomycin.  Marland Kitchen PAD (peripheral artery disease) ([place]) . Ischemic cardiomyopathy . Chronic combined systolic and diastolic CHF (congestive heart failure) ([place]): Currently euvolemic. Monitor volumes.  . Cancer of head of pancreas [facility]): Being followed by oncology, ongoing chemotherapy.  .   Type 2 diabetes mellitus with diabetic peripheral angiopathy and gangrene, with long-term current use of insulin ([place]):  Levemir and sliding scale. A1c last week noted elevated at 9.6  Principal Problem:   Osteomyelitis of ankle and foot (HCC) Active Problems:   Type 2 diabetes mellitus with diabetic peripheral angiopathy and gangrene, with long-term current use of insulin ([place]): Levemir and sliding scale. A1c last week noted elevated at 9.6   PAD (peripheral artery disease) (HCC)   Chronic combined systolic and diastolic CHF (congestive heart failure) (HCC)   Ischemic cardiomyopathy   Cancer of head of pancreas (HCC)   Diabetic foot ulcer (HCC)   DVT prophylaxis:  Lovenox    Code Status: Full code Family y Communication: Left message for sister  Disposition Plan: Anticipate will go to skilled nursing for rehabilitation following surgery   Consults called: Orthopedic surgery-Duda   Admission status:  Given need for surgery and IV antibiotics and will be here at least for 3-4 days, patient being placed in inpatient status    Annita Brod MD [facility] Pager [phone]  If 7PM-7AM, please contact night-coverage [email or web address] Password [email or web address]  02/21/2016, 7:00 PM

## 2016-02-21 NOTE — ED Triage Notes (Signed)
Patient is alert and oriented x4.  He is currently a cancer patient taking chemo.  Patient currently has two wounds on the left leg and foot.  One wound is over a year old and is to the bone.  The second wound is not healing with out patient care and he was referred to the ED for possible admission for wound care.  Currently he rates his pain 7 of 10.

## 2016-02-21 NOTE — Progress Notes (Signed)
Notified advanced home care staff [phone] via text of pt admission and need to be followed for d/c needs HHRN/dme

## 2016-02-21 NOTE — Consult Note (Signed)
ORTHOPAEDIC CONSULTATION  REQUESTING PHYSICIAN: Carmin Muskrat, MD  Chief Complaint: left leg infection  HPI: Donald Steele is a 55 y.o. male who complains of worsening chronic left leg infection. He has a h/o pancreatic cancer. He has had a chronic wound on his left pretibial area that hs been off and on for multiple years. Over that last 1-2wks the left leg has worsened. He feels pain but has decreased sensation.   Past Medical History:  Diagnosis Date  . 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 Cardiomyopathy ([place])    a. Echo 2/17 - EF 35-40%, mod diff HK, inf-septal, inf-lat AK, Gr 1 DD, mild MR  . Carotid stenosis    a. Carotid US 2/17 - bilat ICA 1-39%  . Chronic combined systolic and diastolic CHF (congestive heart failure) ([place])   . Coronary artery disease   . Diabetes mellitus   . Duodenal stenosis   . Generalized weakness   . History of nuclear stress test    a. Nuc study 2/17 - Myocardial perfusion is abnormal. Findings consistent with prior myocardial infarction (inf, inf-lat, apical inf, apical lateral) with peri-infarct ischemia in apical inferolateral and apical lateral regions. This is a high risk study. Overall left ventricular systolic function was abnormal. LV cavity size is severely enlarged. Nuclear stress EF: 29%.   . Hyperlipidemia   . Hypertension   . Peripheral vascular disease due to secondary diabetes mellitus ([place])    a. s/p R Fem-Tibial bypass 2/17   Past Surgical History:  Procedure Laterality Date  . CARDIAC CATHETERIZATION N/A 06/30/2015   Procedure: Left Heart Cath and Coronary Angiography;  Surgeon: Jettie Booze, MD;  Location: [place] [facility];  Service: Cardiovascular;  Laterality: N/A;  . DUODENAL STENT PLACEMENT N/A 12/06/2015   Procedure: DUODENAL STENT PLACEMENT;  Surgeon: Carol Ada, MD;  Location: [facility];  Service: Endoscopy;  Laterality: N/A;  . ENDARTERECTOMY FEMORAL Right 06/02/2015   Procedure:  ENDARTERECTOMY RIGHT PROFUNDA FEMORIS ARTERY;  Surgeon: Mal Misty, MD;  Location: [facility];  Service: Vascular;  Laterality: Right;  . ERCP N/A 12/02/2015   Procedure: ENDOSCOPIC RETROGRADE CHOLANGIOPANCREATOGRAPHY (ERCP);  Surgeon: Carol Ada, MD;  Location: [facility];  Service: Endoscopy;  Laterality: N/A;  . ESOPHAGOGASTRODUODENOSCOPY (EGD) WITH PROPOFOL N/A 12/06/2015   Procedure: ESOPHAGOGASTRODUODENOSCOPY (EGD) WITH PROPOFOL;  Surgeon: Carol Ada, MD;  [facility];  Service: Endoscopy;  Laterality: N/A;  . EUS N/A 12/06/2015   Procedure: UPPER ENDOSCOPIC ULTRASOUND (EUS) LINEAR;  Surgeon: Carol Ada, MD;  [facility];  Service: Endoscopy;  Laterality: N/A;  . FEMORAL-TIBIAL BYPASS GRAFT Right 06/02/2015   Procedure: RIGHT PROFUNDA FEMORIS TO POSTERIOR TIBIAL ARTERY BYPASS USING RIGHT NON-REVERSED GREATER SAPHENOUS VEIN,  SUBCUTANEOUS TUNNEL;  Surgeon: Mal Misty, MD;  Location: [facility];  Service: Vascular;  Laterality: Right;  . INTRAOPERATIVE ARTERIOGRAM Right 06/02/2015   Procedure: INTRA OPERATIVE ARTERIOGRAM RIGHT LOWER LEG;  Surgeon: Mal Misty, MD;  Location: [facility];  Service: Vascular;  Laterality: Right;  . IR GENERIC HISTORICAL  12/03/2015   IR INT EXT BILIARY DRAIN WITH CHOLANGIOGRAM 12/03/2015 [name], MD [facility]  . IR GENERIC HISTORICAL  12/22/2015   IR BILIARY STENT(S) EXISTING ACCESS INC DILATION CATH EXCHANGE 12/22/2015 Corrie Mckusick, DO [facility]  . MULTIPLE TOOTH EXTRACTIONS    . No history of Surgery     . PERIPHERAL VASCULAR CATHETERIZATION N/A 06/01/2015   Procedure: Abdominal Aortogram;  Surgeon: Serafina Mitchell,  MD;  Location: [facility];  Service: Cardiovascular;  Laterality: N/A;  . PERIPHERAL VASCULAR CATHETERIZATION N/A 07/07/2015   Procedure: Aortic Arch Angiography;  Surgeon: Conrad Si[place] Key, MD;  Location: [facility];  Service: Cardiovascular;  Laterality: N/A;  . PERIPHERAL VASCULAR CATHETERIZATION N/A  07/07/2015   Procedure: Upper Extremity Angiography;  Surgeon: Conrad Wildrose, MD;  Location: [facility];  Service: Cardiovascular;  Laterality: N/A;  . PERIPHERAL VASCULAR CATHETERIZATION Left 07/07/2015   Procedure: Peripheral Vascular Intervention;  Surgeon: Conrad [place], MD;  Location: [facility];  Service: Cardiovascular;  Laterality: Left;  left subclavian and axillary  . PORTACATH PLACEMENT N/A 12/29/2015   Procedure: INSERTION PORT-A-CATH;  Surgeon: [name], MD;  Location: [facility];  Service: General;  Laterality: N/A;  . VEIN HARVEST Right 06/02/2015   Procedure: VEIN HARVEST RIGHT GREATER SAPHENOUS VEIN;  Surgeon: Mal Misty, MD;  Location: [facility];  Service: Vascular;  Laterality: Right;   Social History   Social History  . Marital status: Single    Spouse name: N/A  . Number of children: N/A  . Years of education: N/A   Social History Main Topics  . Smoking status: Former Smoker    Packs/day: 1.00    Years: 35.00    Quit date: 05/18/2015  . Smokeless tobacco: Never Used  . Alcohol use No  . Drug use: No  . Sexual activity: Not Asked   Other Topics Concern  . None   Social History Narrative   Single, lives with girlfriend [name]   Works in [employer]   Family History  Problem Relation Age of Onset  . Heart attack Neg Hx    No Known Allergies Prior to Admission medications   Medication Sig Start Date End Date Taking? Authorizing Provider  amoxicillin-clavulanate (AUGMENTIN) 875-125 MG tablet Take 1 tablet by mouth 2 (two) times daily. 01/26/16  Yes Historical Provider, MD  aspirin 81 MG EC tablet Take 1 tablet (81 mg total) by mouth daily. Swallow whole. 07/15/15  Yes Camelia Eng Tysinger, PA-C  atorvastatin (LIPITOR) 40 MG tablet Take 1 tablet (40 mg total) by mouth daily. 07/14/15  Yes Liliane Shi, PA-C  Blood Glucose Monitoring Suppl (ONETOUCH VERIO) w/Device KIT 1 kit by Does not apply route daily. 02/15/16  Yes Camelia Eng Tysinger,  PA-C  collagenase (SANTYL) ointment Apply topically daily. Apply to right foot wound daily. Place moist saline gauze on top and wrap foot daily. 06/07/15  Yes Alvia Grove, PA-C  glucose blood (ONETOUCH VERIO) test strip Test twice a day 02/15/16  Yes Camelia Eng Tysinger, PA-C  lidocaine-prilocaine (EMLA) cream Apply 1 application topically as needed. Apply to port site 1 hr prior to port access and cover with plastic wrap 01/02/16  Yes Ladell Pier, MD  MICROLET LANCETS MISC Test twice a day. Pt  Uses one touch verio meter 02/15/16  Yes Camelia Eng Tysinger, PA-C  morphine (MS CONTIN) 15 MG 12 hr tablet Take 1 tablet (15 mg total) by mouth every 12 (twelve) hours. Patient taking differently: Take 15 mg by mouth 2 (two) times daily as needed for pain.  01/02/16  Yes Ladell Pier, MD  NEEDLE, DISP, 30 G (BD DISP NEEDLES) 30G X 1/2" MISC 1 each by Does not apply route at bedtime. 11/17/15  Yes Camelia Eng Tysinger, PA-C  ciprofloxacin (CIPRO) 500 MG tablet Take 1 tablet (500 mg total) by mouth 2 (two) times daily. Patient  not taking: Reported on 02/21/2016 02/15/16   Camelia Eng Tysinger, PA-C  clopidogrel (PLAVIX) 75 MG tablet Take 1 tablet (75 mg total) by mouth daily. Patient not taking: Reported on 02/21/2016 07/15/15   Camelia Eng Tysinger, PA-C  doxycycline (VIBRAMYCIN) 100 MG capsule Take 1 capsule (100 mg total) by mouth 2 (two) times daily. Patient not taking: Reported on 02/21/2016 02/15/16   Camelia Eng Tysinger, PA-C  insulin degludec (TRESIBA) 100 UNIT/ML SOPN FlexTouch Pen Inject 0.17 mLs (17 Units total) into the skin daily at 10 pm. Patient not taking: Reported on 02/21/2016 02/15/16   Camelia Eng Tysinger, PA-C  insulin glargine (LANTUS) 100 UNIT/ML injection Inject 0.14 mLs (14 Units total) into the skin at bedtime. Patient not taking: Reported on 02/21/2016 12/09/15   Debbe Odea, MD  insulin lispro (HUMALOG KWIKPEN) 100 UNIT/ML KiwkPen Inject 0.05 mLs (5 Units total) into the skin 3 (three) times daily. Patient  not taking: Reported on 02/21/2016 02/15/16   Camelia Eng Tysinger, PA-C  insulin lispro (HUMALOG) 100 UNIT/ML injection Inject 5 Units into the skin 3 (three) times daily before meals. Take 5 units at mealtime.  If you skip a meal, then just take 2.5 unites.  This is in addition to the long acting pen at night.    Historical Provider, MD   Dg Tibia/fibula Left  Result Date: 02/21/2016 CLINICAL DATA:  Diabetic ulcer distal left tibia fibula EXAM: LEFT TIBIA AND FIBULA - 2 VIEW COMPARISON:  None. FINDINGS: Four views of the left tibia fibula submitted. There is soft tissue defect/ irregularity probable ulcer anterior tibial region. There is adjacent cortical irregularity and osteo lysis anterior aspect of the tibial shaft findings are highly suspicious for osteomyelitis. Further correlation with MRI is recommended. There is soft tissue air anterior to the distal ankle. Findings may represent gangrene or necrotizing fasciitis. IMPRESSION: There is soft tissue defect/ irregularity probable ulcer mid anterior tibial region. There is adjacent cortical irregularity and osteo lysis anterior aspect of the tibial shaft findings are highly suspicious for osteomyelitis. Further correlation with MRI is recommended. There is soft tissue air anterior to the distal ankle. Findings may represent gangrene or necrotizing fasciitis. These results were called by telephone at the time of interpretation on 02/21/2016 at 1:28 pm to Dr. Carmin Muskrat , who verbally acknowledged these results. Electronically Signed   By: Lahoma Crocker M.D.   On: 02/21/2016 13:28   Dg Foot 2 Views Left  Result Date: 02/21/2016 CLINICAL DATA:  Diabetes.  Ulceration left fifth toe. EXAM: DG C-ARM 61-120 MIN-NO REPORT; LEFT FOOT - 2 VIEW COMPARISON:  11/22/2015. FINDINGS: Soft tissue swelling noted over the left fifth digit. Erosive changes noted the base of the middle phalanx and distal aspect of the proximal phalanx of the left fifth digit. These findings  suggest septic arthritis with osteomyelitis. Soft tissue air is noted over the dorsum of the foot consistent with soft tissue infection/ gangrene. Peripheral vascular calcification. IMPRESSION: 1. Findings consistent with septic arthritis and adjacent osteomyelitis involving the proximal interphalangeal joint of the left fifth digit. 2. Soft tissue swelling with diffuse soft tissue air noted over the dorsum of the foot ankle consistent with soft tissue infection/gangrene. 3.  Peripheral vascular disease. These results will be called to the ordering clinician or representative by the Radiologist Assistant, and communication documented in the PACS or zVision Dashboard. Electronically Signed   By: Marcello Moores  Register   On: 02/21/2016 13:07    Positive ROS: All other systems have been reviewed  and were otherwise negative with the exception of those mentioned in the HPI and as above.  Labs cbc  Recent Labs  02/21/16 1226  WBC 23.3*  HGB 8.8*  HCT 27.5*  PLT 581*    Labs inflam No results for input(s): CRP in the last 72 hours.  Invalid input(s): ESR  Labs coag No results for input(s): INR, PTT in the last 72 hours.  Invalid input(s): PT   Recent Labs  02/21/16 1226  NA 130*  K 3.3*  CL 94*  CO2 28  GLUCOSE 266*  BUN 7  CREATININE 0.74  CALCIUM 8.4*    Physical Exam: Vitals:   02/21/16 1109 02/21/16 1456  BP: 136/79 159/75  Pulse: 108 103  Resp: 18 16  Temp: 98.4 F (36.9 C)    General: Alert, no acute distress Cardiovascular: No pedal edema Respiratory: No cyanosis, no use of accessory musculature GI: No organomegaly, abdomen is soft and non-tender Skin: No lesions in the area of chief complaint other than those listed below in MSK exam.  Neurologic: Sensation intact distally save for the below mentioned MSK exam Psychiatric: Patient is competent for consent with normal mood and affect Lymphatic: No axillary or cervical lymphadenopathy  MUSCULOSKELETAL:  LLE:  compatments soft, he has some pretibial necrotic skin with exposed muscle of the anterior comparmtnet. No active drainage. Some surrounding erythema. He has a small toe wound as well.  Other extremities are atraumatic with painless ROM and NVI.  Assessment: Left leg acute on chronic wound  Plan: I have asked Dr. Sharol Given to eval patient. I do not see indications of acute rapid spread of his infection, primarily some loss of necrotic dermis. No indication for emergent debridement. Recommend starting IV abx and following labs and clinical appearance.    Renette Butters, MD Cell [phone]   02/21/2016 3:54 PM

## 2016-02-21 NOTE — Progress Notes (Signed)
Pharmacy Antibiotic Note  Donald Steele is a 55 y.o. male admitted on 02/21/2016 with cellulitis/nonhealing wound in cancer patient currently on chemo treatment. Pharmacy has been consulted for vanc dosing. Note Zosyn was ordered x 1 dose - was gram negative coverage meant to be continued  Plan: Vancomycin 1g IV every 8 hours.  Goal trough 15-20 mcg/mL.  Zosyn ordered x 1 in ER - Does gram negative coverage need to be continued?  Height: 5\' 11"  (180.3 cm) Weight: 179 lb (81.2 kg) IBW/kg (Calculated) : 75.3  Temp (24hrs), Avg:98.4 F (36.9 C), Min:98.4 F (36.9 C), Max:98.4 F (36.9 C)   Recent Labs Lab 02/21/16 1226 02/21/16 1240  WBC 23.3*  --   CREATININE 0.74  --   LATICACIDVEN  --  1.12    Estimated Creatinine Clearance: 109.8 mL/min (by C-G formula based on SCr of 0.74 mg/dL).    No Known Allergies   Thank you for allowing pharmacy to be a part of this patient's care.   Adrian Saran, PharmD, BCPS Pager [phone] 02/21/2016 1:39 PM

## 2016-02-21 NOTE — ED Notes (Signed)
Bed: WA03 Expected date:  Expected time:  Means of arrival:  Comments: Leonides Schanz

## 2016-02-21 NOTE — Progress Notes (Signed)
Spoke with daughter Donald Steele at [phone] who confirms DME listed in CM consult is needed Daughter states the [facility] services will be a choice the pt needs to make during his hospitalization but daughter states the family generally cares for him Donald Steele states she and family are trying to move pt from 2nd floor of home to the first floor of home in the next 2 days Donald [facility] and for [facility] services CM discussed she or pt will be called to discuss delivery of DME and start of [facility] services as needed Donald Steele informed of the resources Cm left in pt belonging bag Daughter very appreciative of resources offered

## 2016-02-21 NOTE — ED Notes (Signed)
I did not see anything to collect labs from Nurse will collect them with an ultrasound IV

## 2016-02-21 NOTE — ED Notes (Signed)
2nd set of BC (63mL, R hand) collected and sent by this RN.

## 2016-02-21 NOTE — Progress Notes (Signed)
Pharmacy Antibiotic Note  Donald Steele is a 55 y.o. male admitted on 02/21/2016 with cellulitis/nonhealing wound in cancer patient currently on chemo treatment.  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  Pharmacy has been consulted for vancomycin and Zosyn dosing.  Plan:  Zosyn 3.375g IV Q8H infused over 4hrs.  Vancomycin 1g IV q8h.  Goal VT 15-20 mcg/ml.  Measure Vanc trough at steady state.  Follow up renal fxn, culture results, and clinical course.   Height: 5\' 11"  (180.3 cm) Weight: 174 lb (78.9 kg) IBW/kg (Calculated) : 75.3  Temp (24hrs), Avg:98.7 F (37.1 C), Min:98.4 F (36.9 C), Max:98.9 F (37.2 C)   Recent Labs Lab 02/21/16 1226 02/21/16 1240  WBC 23.3*  --   CREATININE 0.74  --   LATICACIDVEN  --  1.12    Estimated Creatinine Clearance: 109.8 mL/min (by C-G formula based on SCr of 0.74 mg/dL).    No Known Allergies   Thank you for allowing pharmacy to be a part of this patient's care.  Gretta Arab PharmD, BCPS Pager [phone] 02/21/2016 6:46 PM

## 2016-02-22 ENCOUNTER — Inpatient Hospital Stay (HOSPITAL_COMMUNITY): Payer: BLUE CROSS/BLUE SHIELD

## 2016-02-22 ENCOUNTER — Other Ambulatory Visit (INDEPENDENT_AMBULATORY_CARE_PROVIDER_SITE_OTHER): Payer: Self-pay | Admitting: Family

## 2016-02-22 DIAGNOSIS — I96 Gangrene, not elsewhere classified: Secondary | ICD-10-CM

## 2016-02-22 LAB — CBC
HEMATOCRIT: 26.9 % — AB (ref 39.0–52.0)
Hemoglobin: 8.7 g/dL — ABNORMAL LOW (ref 13.0–17.0)
MCH: 25.3 pg — ABNORMAL LOW (ref 26.0–34.0)
MCHC: 32.3 g/dL (ref 30.0–36.0)
MCV: 78.2 fL (ref 78.0–100.0)
Platelets: 628 10*3/uL — ABNORMAL HIGH (ref 150–400)
RBC: 3.44 MIL/uL — ABNORMAL LOW (ref 4.22–5.81)
RDW: 16 % — AB (ref 11.5–15.5)
WBC: 26.8 10*3/uL — ABNORMAL HIGH (ref 4.0–10.5)

## 2016-02-22 LAB — GLUCOSE, CAPILLARY
GLUCOSE-CAPILLARY: 175 mg/dL — AB (ref 65–99)
Glucose-Capillary: 211 mg/dL — ABNORMAL HIGH (ref 65–99)
Glucose-Capillary: 140 mg/dL — ABNORMAL HIGH (ref 65–99)
Glucose-Capillary: 142 mg/dL — ABNORMAL HIGH (ref 65–99)

## 2016-02-22 LAB — BASIC METABOLIC PANEL
Anion gap: 9 (ref 5–15)
BUN: 6 mg/dL (ref 6–20)
CALCIUM: 8.4 mg/dL — AB (ref 8.9–10.3)
CO2: 30 mmol/L (ref 22–32)
CREATININE: 0.67 mg/dL (ref 0.61–1.24)
Chloride: 95 mmol/L — ABNORMAL LOW (ref 101–111)
GFR calc non Af Amer: >60 mL/min (ref >60)
GLUCOSE: 219 mg/dL — AB (ref 65–99)
Potassium: 3.2 mmol/L — ABNORMAL LOW (ref 3.5–5.1)
Sodium: 134 mmol/L — ABNORMAL LOW (ref 135–145)

## 2016-02-22 LAB — SURGICAL PCR SCREEN
MRSA, PCR: NEGATIVE
STAPHYLOCOCCUS AUREUS: NEGATIVE

## 2016-02-22 LAB — HIV ANTIBODY (ROUTINE TESTING W REFLEX): HIV SCREEN 4TH GENERATION: NONREACTIVE

## 2016-02-22 MED ORDER — POTASSIUM CHLORIDE CRYS ER 20 MEQ PO TBCR
40.0000 meq | EXTENDED_RELEASE_TABLET | Freq: Every day | ORAL | Status: AC
  Administered 2016-02-22 – 2016-02-23 (×2): 40 meq via ORAL
  Filled 2016-02-22 (×2): qty 2

## 2016-02-22 MED ORDER — CHLORHEXIDINE GLUCONATE 4 % EX LIQD: 60.0000 mL | Freq: Once | CUTANEOUS | Status: DC

## 2016-02-22 MED ORDER — CEFAZOLIN SODIUM-DEXTROSE 2-4 GM/100ML-% IV SOLN
2.0000 g | INTRAVENOUS | Status: AC
  Administered 2016-02-23: 2 g via INTRAVENOUS
  Filled 2016-02-22 (×2): qty 100

## 2016-02-22 MED ORDER — HYDROMORPHONE HCL 2 MG/ML IJ SOLN
1.0000 mg | INTRAMUSCULAR | Status: DC | PRN
  Administered 2016-02-24: 1 mg via INTRAVENOUS
  Filled 2016-02-22 (×3): qty 1

## 2016-02-22 NOTE — Progress Notes (Signed)
VASCULAR LAB PRELIMINARY  ARTERIAL  ABI completed: Right ABI indicates moderate arterial disease. Unable to perform left ABI as patient could not tolerate blood pressure cuff at ankle. Unable to obtain left AT waveform, this may be due to bandage interference.     RIGHT    LEFT    PRESSURE WAVEFORM  PRESSURE WAVEFORM  BRACHIAL 140 Bi BRACHIAL 131 Bi  AT 65 Dampened mono AT  Not audible  PT 91 Dampened mono PT  Dampened mono    RIGHT LEFT  ABI 0.65 N/A     Landry Mellow, RDMS, RVT   02/22/2016, 9:40 AM

## 2016-02-22 NOTE — Progress Notes (Signed)
CSW consulted to assist with meds at d/c. CSW is unable to assist with this request. In some cases RNCM is able to assist with meds at d/c. [facility] consult has been placed.  CSW signing off.  Werner Lean LCSW [phone]

## 2016-02-22 NOTE — Progress Notes (Signed)
   02/22/16 1100  Clinical Encounter Type  Visited With Patient and family together  Visit Type Initial  Referral From Nurse  Consult/Referral To Chaplain  Spiritual Encounters  Spiritual Needs Emotional  CHP visited with patient and family member. Offered emotional support. Roe Coombs 02/22/16

## 2016-02-22 NOTE — Progress Notes (Signed)
PROGRESS NOTE    Donald Steele  [id number] DOB: Aug 20, 1960 DOA: 02/21/2016 PCP: Donald Oxford, PA-C   Brief Narrative: 55 year old male with history of uncontrolled diabetes, peripheral vascular disease with chronic left lower extremity foot ulcer that has been managed at wound care center, sent to ER for further evaluation of worsening on. MRI of left foot consistent with cellulitis, myofascitis and osteomyelitis. Already evaluated by orthopedics and transferring to [name] for surgical intervention.  Assessment & Plan:   # Sepsis due to Osteomyelitis of left foot and ankle: Evaluated by orthopedics Dr. Sharol Given. MRI of foot reviewed. Plan for transtibial amputation on the left side at [name] tomorrow. Continue broad spectrum antibiotics with IV vancomycin and Zosyn. Follow up culture results. -Continue wound care and supportive treatment. -Ordered for consult appreciated. -Pain medications adjusted.  #  Type 2 diabetes mellitus with diabetic peripheral angiopathy and gangrene, with long-term current use of insulin: Continue current insulin regimen. Monitor blood sugar level. Diabetic diet.    #Chronic combined systolic and diastolic CHF (congestive heart failure) [name]): Patient is currently euvolemic. He does not have chest pain. Stable.  #Cancer of head of pancreas [name]): Advised outpatient follow-up with oncologist.  # Hypokalemia: Repleted oral potassium. Monitor lab.  DVT prophylaxis: Was on Lovenox. I will discontinue tonight's Lovenox dose for possible surgical intervention tomorrow. Code Status: Full code Family Communication: No family present at bedside Disposition Plan: Now transferring to [name]. He will likely be discharged home versus rehabilitation in 2-3 days depending on surgery and clinical response.    Consultants:   Orthopedics  Procedures: None Antimicrobials: IV vancomycin and Zosyn since November 7  Subjective:  Patient was seen and examined at bedside. Patient reported severe pain in his left leg. Feels weak tired. Denied chills, nausea, vomiting, chest pain, shortness of breath.   Objective: Vitals:   02/21/16 1631 02/21/16 1720 02/21/16 2345 02/22/16 1349  BP: 117/58 130/67 (!) 145/73 124/64  Pulse: 102 99 (!) 122 98  Resp: 18 15 20 15   Temp: 98.9 F (37.2 C) 98.9 F (37.2 C) 98.7 F (37.1 C) 98.6 F (37 C)  TempSrc: Oral Oral Oral Oral  SpO2: 99% 98% 100% 100%  Weight:  78.9 kg (174 lb)    Height:  5\' 11"  (1.803 m)      Intake/Output Summary (Last 24 hours) at 02/22/16 1450 Last data filed at 02/22/16 1349  Gross per 24 hour  Intake             1930 ml  Output              800 ml  Net             1130 ml   Filed Weights   02/21/16 1109 02/21/16 1720  Weight: 81.2 kg (179 lb) 78.9 kg (174 lb)    Examination:  General exam:Looks uncomfortable because of leg pain.  Respiratory system: Clear to auscultation. Respiratory effort normal. No wheezing or crackle Cardiovascular system: S1 & S2 heard, RRR.  No pedal edema. Gastrointestinal system: Abdomen is nondistended, soft and nontender. Normal bowel sounds heard. Central nervous system: Alert and oriented. No focal neurological deficits. Extremities: Left leg has dressing applied, Skin: Left leg has open wound and dressing. Psychiatry: Judgement and insight appear normal. Mood & affect appropriate.     Data Reviewed: I have personally reviewed following labs and imaging studies  CBC:  Recent Labs Lab 02/21/16 1226 02/21/16 1947 02/22/16 0520  WBC 23.3* 27.9* 26.8*  NEUTROABS 18.4* 22.1*  --   HGB 8.8* 9.5* 8.7*  HCT 27.5* 29.1* 26.9*  MCV 78.3 78.4 78.2  PLT 581* 612* 123456*   Basic Metabolic Panel:  Recent Labs Lab 02/21/16 1226 02/22/16 0520  NA 130* 134*  K 3.3* 3.2*  CL 94* 95*  CO2 28 30  GLUCOSE 266* 219*  BUN 7 6  CREATININE 0.74 0.67  CALCIUM 8.4* 8.4*   GFR: Estimated Creatinine Clearance:  109.8 mL/min (by C-G formula based on SCr of 0.67 mg/dL). Liver Function Tests:  Recent Labs Lab 02/21/16 1226  AST 13*  ALT 10*  ALKPHOS 133*  BILITOT 0.8  PROT 7.0  ALBUMIN 2.5*    Recent Labs Lab 02/21/16 1226  LIPASE 11   No results for input(s): AMMONIA in the last 168 hours. Coagulation Profile: No results for input(s): INR, PROTIME in the last 168 hours. Cardiac Enzymes: No results for input(s): CKTOTAL, CKMB, CKMBINDEX, TROPONINI in the last 168 hours. BNP (last 3 results) No results for input(s): PROBNP in the last 8760 hours. HbA1C: No results for input(s): HGBA1C in the last 72 hours. CBG:  Recent Labs Lab 02/22/16 0001 02/22/16 0805 02/22/16 1216  GLUCAP 197* 211* 175*   Lipid Profile: No results for input(s): CHOL, HDL, LDLCALC, TRIG, CHOLHDL, LDLDIRECT in the last 72 hours. Thyroid Function Tests: No results for input(s): TSH, T4TOTAL, FREET4, T3FREE, THYROIDAB in the last 72 hours. Anemia Panel: No results for input(s): VITAMINB12, FOLATE, FERRITIN, TIBC, IRON, RETICCTPCT in the last 72 hours. Sepsis Labs:  Recent Labs Lab 02/21/16 1240  LATICACIDVEN 1.12    Recent Results (from the past 240 hour(s))  Culture, blood (routine x 2)     Status: None (Preliminary result)   Collection Time: 02/21/16 12:30 PM  Result Value Ref Range Status   Specimen Description BLOOD PORTA CATH  Final   Special Requests BOTTLES DRAWN AEROBIC AND ANAEROBIC 5ML  Final   Culture   Final    NO GROWTH < 24 HOURS Performed at [facility]    Report Status PENDING  Incomplete  Surgical PCR screen     Status: None   Collection Time: 02/22/16  4:30 AM  Result Value Ref Range Status   MRSA, PCR NEGATIVE NEGATIVE Final   Staphylococcus aureus NEGATIVE NEGATIVE Final    Comment:        The Xpert SA Assay (FDA approved for NASAL specimens in patients over 39 years of age), is one component of a comprehensive surveillance program.  Test performance has been  validated by [facility] for patients greater than or equal to 62 year old. It is not intended to diagnose infection nor to guide or monitor treatment.          Radiology Studies: Dg Tibia/fibula Left  Result Date: 02/21/2016 CLINICAL DATA:  Diabetic ulcer distal left tibia fibula EXAM: LEFT TIBIA AND FIBULA - 2 VIEW COMPARISON:  None. FINDINGS: Four views of the left tibia fibula submitted. There is soft tissue defect/ irregularity probable ulcer anterior tibial region. There is adjacent cortical irregularity and osteo lysis anterior aspect of the tibial shaft findings are highly suspicious for osteomyelitis. Further correlation with MRI is recommended. There is soft tissue air anterior to the distal ankle. Findings may represent gangrene or necrotizing fasciitis. IMPRESSION: There is soft tissue defect/ irregularity probable ulcer mid anterior tibial region. There is adjacent cortical irregularity and osteo lysis anterior aspect of the tibial shaft findings are highly suspicious  for osteomyelitis. Further correlation with MRI is recommended. There is soft tissue air anterior to the distal ankle. Findings may represent gangrene or necrotizing fasciitis. These results were called by telephone at the time of interpretation on 02/21/2016 at 1:28 pm to Dr. Carmin Muskrat , who verbally acknowledged these results. Electronically Signed   By: Lahoma Crocker M.D.   On: 02/21/2016 13:28   Mr Tibia Fibula Left Wo Contrast  Result Date: 02/22/2016 CLINICAL DATA:  Chronic lower leg wound/infection. EXAM: MRI OF LOWER LEFT EXTREMITY WITHOUT CONTRAST; MRI OF THE LEFT FOREFOOT WITHOUT AND WITH CONTRAST; MRI OF THE LEFT ANKLE WITHOUT AND WITH CONTRAST TECHNIQUE: Multiplanar, multisequence MR imaging of the left lower extremity was performed both before and after administration of intravenous contrast. CONTRAST:  14mL MULTIHANCE GADOBENATE DIMEGLUMINE 529 MG/ML IV SOLN COMPARISON:  Radiographs, same date. FINDINGS:  Chronic open wound involving the lower tibial area anteriorly with air dissecting into the subcutaneous soft tissues down into the ankle area. Diffuse cellulitis and myofasciitis without discrete drainable soft tissue abscess. Diffuse cortical thickening involving the anterior aspect of the tibia with a small focus of mild increased STIR signal intensity in the thickened cortex in the area of the open wound. No significant marrow edema but there appears to be slight enhancement. Findings suspicious for osteomyelitis. Moderate surrounding myofasciitis. There is osteomyelitis involving the proximal and middle phalanges of the fifth toe with septic arthritis at the proximal IP joint. Possible early changes of osteomyelitis also involving the fourth proximal phalanx. No findings for septic arthritis at the ankle joint. There are moderate midfoot degenerative changes but no definite findings for septic arthritis in this area. IMPRESSION: 1. Large open wound along the anterior aspect of the lower tibia with findings suspicious for early osteomyelitis involving the anterior cortex. 2. Diffuse cellulitis and myofasciitis without discrete drainable soft tissue abscess or pyomyositis. 3. The septic arthritis involving the proximal interphalangeal joint of the fifth toe with osteomyelitis involving the proximal and middle phalanges. Suspect osteomyelitis also involving the fourth proximal phalanx. Electronically Signed   By: Marijo Sanes M.D.   On: 02/22/2016 08:15   Mr Foot Left W Wo Contrast  Result Date: 02/22/2016 CLINICAL DATA:  Chronic lower leg wound/infection. EXAM: MRI OF LOWER LEFT EXTREMITY WITHOUT CONTRAST; MRI OF THE LEFT FOREFOOT WITHOUT AND WITH CONTRAST; MRI OF THE LEFT ANKLE WITHOUT AND WITH CONTRAST TECHNIQUE: Multiplanar, multisequence MR imaging of the left lower extremity was performed both before and after administration of intravenous contrast. CONTRAST:  51mL MULTIHANCE GADOBENATE DIMEGLUMINE 529  MG/ML IV SOLN COMPARISON:  Radiographs, same date. FINDINGS: Chronic open wound involving the lower tibial area anteriorly with air dissecting into the subcutaneous soft tissues down into the ankle area. Diffuse cellulitis and myofasciitis without discrete drainable soft tissue abscess. Diffuse cortical thickening involving the anterior aspect of the tibia with a small focus of mild increased STIR signal intensity in the thickened cortex in the area of the open wound. No significant marrow edema but there appears to be slight enhancement. Findings suspicious for osteomyelitis. Moderate surrounding myofasciitis. There is osteomyelitis involving the proximal and middle phalanges of the fifth toe with septic arthritis at the proximal IP joint. Possible early changes of osteomyelitis also involving the fourth proximal phalanx. No findings for septic arthritis at the ankle joint. There are moderate midfoot degenerative changes but no definite findings for septic arthritis in this area. IMPRESSION: 1. Large open wound along the anterior aspect of the lower tibia with findings suspicious  for early osteomyelitis involving the anterior cortex. 2. Diffuse cellulitis and myofasciitis without discrete drainable soft tissue abscess or pyomyositis. 3. The septic arthritis involving the proximal interphalangeal joint of the fifth toe with osteomyelitis involving the proximal and middle phalanges. Suspect osteomyelitis also involving the fourth proximal phalanx. Electronically Signed   By: Marijo Sanes M.D.   On: 02/22/2016 08:15   Mr Ankle Left W Wo Contrast  Result Date: 02/22/2016 CLINICAL DATA:  Chronic lower leg wound/infection. EXAM: MRI OF LOWER LEFT EXTREMITY WITHOUT CONTRAST; MRI OF THE LEFT FOREFOOT WITHOUT AND WITH CONTRAST; MRI OF THE LEFT ANKLE WITHOUT AND WITH CONTRAST TECHNIQUE: Multiplanar, multisequence MR imaging of the left lower extremity was performed both before and after administration of intravenous  contrast. CONTRAST:  69mL MULTIHANCE GADOBENATE DIMEGLUMINE 529 MG/ML IV SOLN COMPARISON:  Radiographs, same date. FINDINGS: Chronic open wound involving the lower tibial area anteriorly with air dissecting into the subcutaneous soft tissues down into the ankle area. Diffuse cellulitis and myofasciitis without discrete drainable soft tissue abscess. Diffuse cortical thickening involving the anterior aspect of the tibia with a small focus of mild increased STIR signal intensity in the thickened cortex in the area of the open wound. No significant marrow edema but there appears to be slight enhancement. Findings suspicious for osteomyelitis. Moderate surrounding myofasciitis. There is osteomyelitis involving the proximal and middle phalanges of the fifth toe with septic arthritis at the proximal IP joint. Possible early changes of osteomyelitis also involving the fourth proximal phalanx. No findings for septic arthritis at the ankle joint. There are moderate midfoot degenerative changes but no definite findings for septic arthritis in this area. IMPRESSION: 1. Large open wound along the anterior aspect of the lower tibia with findings suspicious for early osteomyelitis involving the anterior cortex. 2. Diffuse cellulitis and myofasciitis without discrete drainable soft tissue abscess or pyomyositis. 3. The septic arthritis involving the proximal interphalangeal joint of the fifth toe with osteomyelitis involving the proximal and middle phalanges. Suspect osteomyelitis also involving the fourth proximal phalanx. Electronically Signed   By: Marijo Sanes M.D.   On: 02/22/2016 08:15   Dg Foot 2 Views Left  Result Date: 02/21/2016 CLINICAL DATA:  Diabetes.  Ulceration left fifth toe. EXAM: DG C-ARM 61-120 MIN-NO REPORT; LEFT FOOT - 2 VIEW COMPARISON:  11/22/2015. FINDINGS: Soft tissue swelling noted over the left fifth digit. Erosive changes noted the base of the middle phalanx and distal aspect of the proximal  phalanx of the left fifth digit. These findings suggest septic arthritis with osteomyelitis. Soft tissue air is noted over the dorsum of the foot consistent with soft tissue infection/ gangrene. Peripheral vascular calcification. IMPRESSION: 1. Findings consistent with septic arthritis and adjacent osteomyelitis involving the proximal interphalangeal joint of the left fifth digit. 2. Soft tissue swelling with diffuse soft tissue air noted over the dorsum of the foot ankle consistent with soft tissue infection/gangrene. 3.  Peripheral vascular disease. These results will be called to the ordering clinician or representative by the Radiologist Assistant, and communication documented in the PACS or zVision Dashboard. Electronically Signed   By: Marcello Moores  Register   On: 02/21/2016 13:07        Scheduled Meds: . sodium chloride   Intravenous STAT  . atorvastatin  40 mg Oral Daily  . docusate sodium  100 mg Oral BID  . enoxaparin (LOVENOX) injection  40 mg Subcutaneous Q24H  . insulin aspart  0-5 Units Subcutaneous QHS  . insulin aspart  0-9 Units Subcutaneous TID  WC  . insulin detemir  10 Units Subcutaneous QHS  . piperacillin-tazobactam (ZOSYN)  IV  3.375 g Intravenous Q8H  . potassium chloride  40 mEq Oral Daily  . senna  1 tablet Oral BID  . vancomycin  1,000 mg Intravenous Q8H   Continuous Infusions: . sodium chloride       LOS: 1 day    Time spent: Around 32 minutes.    Dron Tanna Furry, MD [facility] Pager [phone]  If 7PM-7AM, please contact night-coverage [email or web address] Password [email or web address] 02/22/2016, 2:50 PM

## 2016-02-23 ENCOUNTER — Inpatient Hospital Stay (HOSPITAL_COMMUNITY): Payer: BLUE CROSS/BLUE SHIELD | Admitting: Certified Registered"

## 2016-02-23 ENCOUNTER — Encounter (HOSPITAL_COMMUNITY)
Admission: EM | Disposition: A | Discharge status: Home Health | Payer: Self-pay | Source: Home / Self Care | Attending: Internal Medicine

## 2016-02-23 ENCOUNTER — Encounter (HOSPITAL_COMMUNITY): Payer: Self-pay

## 2016-02-23 DIAGNOSIS — I739 Peripheral vascular disease, unspecified: Secondary | ICD-10-CM

## 2016-02-23 DIAGNOSIS — E43 Unspecified severe protein-calorie malnutrition: Secondary | ICD-10-CM | POA: Insufficient documentation

## 2016-02-23 DIAGNOSIS — M869 Osteomyelitis, unspecified: Secondary | ICD-10-CM

## 2016-02-23 HISTORY — PX: AMPUTATION: SHX166

## 2016-02-23 LAB — BASIC METABOLIC PANEL
ANION GAP: 6 (ref 5–15)
BUN: 5 mg/dL — ABNORMAL LOW (ref 6–20)
CHLORIDE: 98 mmol/L — AB (ref 101–111)
CO2: 30 mmol/L (ref 22–32)
Calcium: 8.2 mg/dL — ABNORMAL LOW (ref 8.9–10.3)
Creatinine, Ser: 0.84 mg/dL (ref 0.61–1.24)
GFR calc non Af Amer: >60 mL/min (ref >60)
Glucose, Bld: 117 mg/dL — ABNORMAL HIGH (ref 65–99)
Potassium: 3.4 mmol/L — ABNORMAL LOW (ref 3.5–5.1)
Sodium: 134 mmol/L — ABNORMAL LOW (ref 135–145)

## 2016-02-23 LAB — GLUCOSE, CAPILLARY
Glucose-Capillary: 83 mg/dL (ref 65–99)
Glucose-Capillary: 62 mg/dL — ABNORMAL LOW (ref 65–99)
Glucose-Capillary: 129 mg/dL — ABNORMAL HIGH (ref 65–99)
Glucose-Capillary: 103 mg/dL — ABNORMAL HIGH (ref 65–99)
Glucose-Capillary: 107 mg/dL — ABNORMAL HIGH (ref 65–99)

## 2016-02-23 LAB — CBC
HEMATOCRIT: 26.7 % — AB (ref 39.0–52.0)
HEMOGLOBIN: 8.4 g/dL — AB (ref 13.0–17.0)
MCH: 24.6 pg — AB (ref 26.0–34.0)
MCHC: 31.5 g/dL (ref 30.0–36.0)
MCV: 78.3 fL (ref 78.0–100.0)
Platelets: 625 10*3/uL — ABNORMAL HIGH (ref 150–400)
RBC: 3.41 MIL/uL — AB (ref 4.22–5.81)
RDW: 16.1 % — ABNORMAL HIGH (ref 11.5–15.5)
WBC: 26.8 10*3/uL — ABNORMAL HIGH (ref 4.0–10.5)

## 2016-02-23 SURGERY — AMPUTATION BELOW KNEE
Anesthesia: Choice | Site: Leg Lower | Laterality: Left

## 2016-02-23 MED ORDER — ONDANSETRON HCL 4 MG PO TABS: 4.0000 mg | ORAL_TABLET | Freq: Four times a day (QID) | ORAL | Status: DC | PRN

## 2016-02-23 MED ORDER — ONDANSETRON HCL 4 MG/2ML IJ SOLN
INTRAMUSCULAR | Status: DC | PRN
  Administered 2016-02-23: 4 mg via INTRAVENOUS

## 2016-02-23 MED ORDER — METHOCARBAMOL 500 MG PO TABS
500.0000 mg | ORAL_TABLET | Freq: Four times a day (QID) | ORAL | Status: DC | PRN
  Administered 2016-02-23 – 2016-02-29 (×10): 500 mg via ORAL
  Filled 2016-02-23 (×9): qty 1

## 2016-02-23 MED ORDER — 0.9 % SODIUM CHLORIDE (POUR BTL) OPTIME
TOPICAL | Status: DC | PRN
  Administered 2016-02-23: 1000 mL

## 2016-02-23 MED ORDER — FENTANYL CITRATE (PF) 100 MCG/2ML IJ SOLN
INTRAMUSCULAR | Status: AC
  Filled 2016-02-23: qty 2

## 2016-02-23 MED ORDER — METHOCARBAMOL 1000 MG/10ML IJ SOLN
500.0000 mg | Freq: Four times a day (QID) | INTRAMUSCULAR | Status: DC | PRN
  Filled 2016-02-23: qty 5

## 2016-02-23 MED ORDER — HYDROMORPHONE HCL 1 MG/ML IJ SOLN
0.2500 mg | INTRAMUSCULAR | Status: DC | PRN
  Administered 2016-02-23 (×4): 0.5 mg via INTRAVENOUS

## 2016-02-23 MED ORDER — LACTATED RINGERS IV SOLN
INTRAVENOUS | Status: DC
  Administered 2016-02-23: 16:00:00 via INTRAVENOUS

## 2016-02-23 MED ORDER — MEPERIDINE HCL 25 MG/ML IJ SOLN: 6.2500 mg | INTRAMUSCULAR | Status: DC | PRN

## 2016-02-23 MED ORDER — FENTANYL CITRATE (PF) 100 MCG/2ML IJ SOLN
INTRAMUSCULAR | Status: DC | PRN
  Administered 2016-02-23: 100 ug via INTRAVENOUS

## 2016-02-23 MED ORDER — METOCLOPRAMIDE HCL 5 MG/ML IJ SOLN: 5.0000 mg | Freq: Three times a day (TID) | INTRAMUSCULAR | Status: DC | PRN

## 2016-02-23 MED ORDER — SUCCINYLCHOLINE CHLORIDE 20 MG/ML IJ SOLN
INTRAMUSCULAR | Status: DC | PRN
  Administered 2016-02-23: 100 mg via INTRAVENOUS

## 2016-02-23 MED ORDER — HYDROMORPHONE HCL 2 MG/ML IJ SOLN
INTRAMUSCULAR | Status: AC
  Filled 2016-02-23: qty 1

## 2016-02-23 MED ORDER — DEXTROSE 50 % IV SOLN
25.0000 mL | Freq: Once | INTRAVENOUS | Status: AC
  Administered 2016-02-23: 25 mL via INTRAVENOUS
  Filled 2016-02-23: qty 50

## 2016-02-23 MED ORDER — SUCCINYLCHOLINE CHLORIDE 200 MG/10ML IV SOSY
PREFILLED_SYRINGE | INTRAVENOUS | Status: AC
  Filled 2016-02-23: qty 10

## 2016-02-23 MED ORDER — ACETAMINOPHEN 325 MG PO TABS: 650.0000 mg | ORAL_TABLET | Freq: Four times a day (QID) | ORAL | Status: DC | PRN

## 2016-02-23 MED ORDER — ACETAMINOPHEN 650 MG RE SUPP: 650.0000 mg | Freq: Four times a day (QID) | RECTAL | Status: DC | PRN

## 2016-02-23 MED ORDER — ENSURE ENLIVE PO LIQD
237.0000 mL | Freq: Two times a day (BID) | ORAL | Status: DC
  Administered 2016-02-24 – 2016-03-01 (×7): 237 mL via ORAL

## 2016-02-23 MED ORDER — PROPOFOL 10 MG/ML IV BOLUS
INTRAVENOUS | Status: AC
  Filled 2016-02-23: qty 20

## 2016-02-23 MED ORDER — PROMETHAZINE HCL 25 MG/ML IJ SOLN: 6.2500 mg | INTRAMUSCULAR | Status: DC | PRN

## 2016-02-23 MED ORDER — METOCLOPRAMIDE HCL 5 MG PO TABS: 5.0000 mg | ORAL_TABLET | Freq: Three times a day (TID) | ORAL | Status: DC | PRN

## 2016-02-23 MED ORDER — ONDANSETRON HCL 4 MG/2ML IJ SOLN
INTRAMUSCULAR | Status: AC
  Filled 2016-02-23: qty 2

## 2016-02-23 MED ORDER — LIDOCAINE HCL (CARDIAC) 20 MG/ML IV SOLN
INTRAVENOUS | Status: DC | PRN
  Administered 2016-02-23: 50 mg via INTRAVENOUS

## 2016-02-23 MED ORDER — LACTATED RINGERS IV SOLN: INTRAVENOUS | Status: DC

## 2016-02-23 MED ORDER — ONDANSETRON HCL 4 MG/2ML IJ SOLN: 4.0000 mg | Freq: Four times a day (QID) | INTRAMUSCULAR | Status: DC | PRN

## 2016-02-23 MED ORDER — PROPOFOL 10 MG/ML IV BOLUS
INTRAVENOUS | Status: DC | PRN
  Administered 2016-02-23: 130 mg via INTRAVENOUS

## 2016-02-23 MED ORDER — PRO-STAT SUGAR FREE PO LIQD
30.0000 mL | Freq: Two times a day (BID) | ORAL | Status: DC
  Administered 2016-02-24 – 2016-03-01 (×5): 30 mL via ORAL
  Filled 2016-02-23 (×11): qty 30

## 2016-02-23 MED ORDER — DEXTROSE 50 % IV SOLN
INTRAVENOUS | Status: AC
  Administered 2016-02-23: 25 mL via INTRAVENOUS
  Filled 2016-02-23: qty 50

## 2016-02-23 SURGICAL SUPPLY — 34 items
BLADE SAW RECIP 87.9 MT (BLADE) ×3 IMPLANT
BLADE SURG 21 STRL SS (BLADE) ×3 IMPLANT
BNDG COHESIVE 6X5 TAN STRL LF (GAUZE/BANDAGES/DRESSINGS) ×3 IMPLANT
BNDG GAUZE ELAST 4 BULKY (GAUZE/BANDAGES/DRESSINGS) ×6 IMPLANT
CANISTER WOUND CARE 500ML ATS (WOUND CARE) ×3 IMPLANT
COVER SURGICAL LIGHT HANDLE (MISCELLANEOUS) ×6 IMPLANT
DRAPE HALF SHEET 40X57 (DRAPES) ×3 IMPLANT
DRAPE INCISE IOBAN 85X60 (DRAPES) IMPLANT
DRAPE PROXIMA HALF (DRAPES) ×3 IMPLANT
DRAPE U-SHAPE 47X51 STRL (DRAPES) ×3 IMPLANT
DRSG ADAPTIC 3X8 NADH LF (GAUZE/BANDAGES/DRESSINGS) ×3 IMPLANT
DRSG PAD ABDOMINAL 8X10 ST (GAUZE/BANDAGES/DRESSINGS) ×3 IMPLANT
DRSG VAC ATS LRG SENSATRAC (GAUZE/BANDAGES/DRESSINGS) ×6 IMPLANT
DURAPREP 26ML APPLICATOR (WOUND CARE) ×3 IMPLANT
ELECT REM PT RETURN 9FT ADLT (ELECTROSURGICAL) ×3
ELECTRODE REM PT RTRN 9FT ADLT (ELECTROSURGICAL) ×1 IMPLANT
GAUZE SPONGE 4X4 12PLY STRL (GAUZE/BANDAGES/DRESSINGS) ×3 IMPLANT
GLOVE BIOGEL PI IND STRL 9 (GLOVE) ×1 IMPLANT
GLOVE BIOGEL PI INDICATOR 9 (GLOVE) ×2
GLOVE SURG ORTHO 9.0 STRL STRW (GLOVE) ×3 IMPLANT
GOWN STRL REUS W/ TWL XL LVL3 (GOWN DISPOSABLE) ×2 IMPLANT
GOWN STRL REUS W/TWL XL LVL3 (GOWN DISPOSABLE) ×4
KIT BASIN OR (CUSTOM PROCEDURE TRAY) ×3 IMPLANT
KIT ROOM TURNOVER OR (KITS) ×3 IMPLANT
MANIFOLD NEPTUNE II (INSTRUMENTS) ×3 IMPLANT
NS IRRIG 1000ML POUR BTL (IV SOLUTION) ×3 IMPLANT
PACK GENERAL/GYN (CUSTOM PROCEDURE TRAY) ×3 IMPLANT
PAD ABD 8X10 STRL (GAUZE/BANDAGES/DRESSINGS) ×6 IMPLANT
PAD ARMBOARD 7.5X6 YLW CONV (MISCELLANEOUS) ×3 IMPLANT
SPONGE GAUZE 4X4 12PLY STER LF (GAUZE/BANDAGES/DRESSINGS) ×3 IMPLANT
STOCKINETTE IMPERVIOUS LG (DRAPES) ×3 IMPLANT
SUT SILK 2 0 (SUTURE) ×2
SUT SILK 2-0 18XBRD TIE 12 (SUTURE) ×1 IMPLANT
TOWEL OR 17X26 10 PK STRL BLUE (TOWEL DISPOSABLE) ×3 IMPLANT

## 2016-02-23 NOTE — Progress Notes (Signed)
Returned from PACU alert and oriented. Wound vac in place to left stump. No active drainage observed at this time. Pt offers no c/o discomfort.

## 2016-02-23 NOTE — Interval H&P Note (Signed)
History and Physical Interval Note:  02/23/2016 6:29 AM  Donald Steele  has presented today for surgery, with the diagnosis of Osteomyelitis Left Ankle/Foot  The various methods of treatment have been discussed with the patient and family. After consideration of risks, benefits and other options for treatment, the patient has consented to  Procedure(s): LEFT BELOW KNEE AMPUTATION (Left) as a surgical intervention .  The patient's history has been reviewed, patient examined, no change in status, stable for surgery.  I have reviewed the patient's chart and labs.  Questions were answered to the patient's satisfaction.     Newt Minion

## 2016-02-23 NOTE — Anesthesia Preprocedure Evaluation (Addendum)
Anesthesia Evaluation  Patient identified by MRN, date of birth, ID band Patient awake    Reviewed: Allergy & Precautions, NPO status , Patient's Chart, lab work & pertinent test results  Airway Mallampati: II  TM Distance: >3 FB Neck ROM: Full    Dental  (+) Poor Dentition, Chipped, Missing, Dental Advisory Given   Pulmonary former smoker,    breath sounds clear to auscultation       Cardiovascular hypertension, + CAD, + Peripheral Vascular Disease and +CHF   Rhythm:Regular Rate:Normal     Neuro/Psych negative neurological ROS  negative psych ROS   GI/Hepatic   Endo/Other  diabetes, Type 2, Insulin Dependent  Renal/GU   negative genitourinary   Musculoskeletal negative musculoskeletal ROS (+)   Abdominal   Peds negative pediatric ROS (+)  Hematology negative hematology ROS (+)   Anesthesia Other Findings   Reproductive/Obstetrics negative OB ROS                           Anesthesia Physical Anesthesia Plan  ASA: III  Anesthesia Plan: General   Post-op Pain Management:    Induction: Intravenous  Airway Management Planned: Oral ETT  Additional Equipment:   Intra-op Plan:   Post-operative Plan: Extubation in OR  Informed Consent: I have reviewed the patients History and Physical, chart, labs and discussed the procedure including the risks, benefits and alternatives for the proposed anesthesia with the patient or authorized representative who has indicated his/her understanding and acceptance.   Dental advisory given  Plan Discussed with: CRNA  Anesthesia Plan Comments:        Anesthesia Quick Evaluation

## 2016-02-23 NOTE — Transfer of Care (Signed)
Immediate Anesthesia Transfer of Care Note  Patient: Donald Steele  Procedure(s) Performed: Procedure(s): LEFT BELOW KNEE AMPUTATION (Left)  Patient Location: PACU  Anesthesia Type:General  Level of Consciousness: awake  Airway & Oxygen Therapy: Patient Spontanous Breathing and Patient connected to nasal cannula oxygen  Post-op Assessment: Report given to RN  Post vital signs: Reviewed and stable  Last Vitals:  Vitals:   02/23/16 1300 02/23/16 1756  BP: 126/66 (!) 157/70  Pulse: 98 (!) 110  Resp: 17 19  Temp: 37.1 C     Last Pain:  Vitals:   02/23/16 1300  TempSrc: Oral  PainSc:       Patients Stated Pain Goal: 3 ([id number])  Complications: No apparent anesthesia complications

## 2016-02-23 NOTE — H&P (View-Only) (Signed)
ORTHOPAEDIC CONSULTATION  REQUESTING PHYSICIAN: Annita Brod, MD  Chief Complaint: Gangrene abscess chronic osteomyelitis and ulceration left lower extremity.  HPI: Donald Steele is a 55 y.o. 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 Patient states that he's been going to the wound clinic for 30 weeks for wound care. He states that recently he dropped some hot spaghetti on his foot causing the black eschar on the dorsum of his foot.  Of note patient is currently on chemotherapy for pancreatic cancer has diabetes and peripheral vascular disease.  Past Medical History:  Diagnosis Date  . 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 Cardiomyopathy ([place])    a. Echo 2/17 - EF 35-40%, mod diff HK, inf-septal, inf-lat AK, Gr 1 DD, mild MR  . Carotid stenosis    a. Carotid US 2/17 - bilat ICA 1-39%  . Chronic combined systolic and diastolic CHF (congestive heart failure) ([place])   . Coronary artery disease   . Diabetes mellitus   . Duodenal stenosis   . Generalized weakness   . History of nuclear stress test    a. Nuc study 2/17 - Myocardial perfusion is abnormal. Findings consistent with prior myocardial infarction (inf, inf-lat, apical inf, apical lateral) with peri-infarct ischemia in apical inferolateral and apical lateral regions. This is a high risk study. Overall left ventricular systolic function was abnormal. LV cavity size is severely enlarged. Nuclear stress EF: 29%.   . Hyperlipidemia   . Hypertension   . Peripheral vascular disease due to secondary diabetes mellitus ([place])    a. s/p R Fem-Tibial bypass 2/17   Past Surgical History:  Procedure Laterality Date  . CARDIAC CATHETERIZATION N/A 06/30/2015   Procedure: Left Heart Cath and Coronary Angiography;  Surgeon: Jettie Booze, MD;  Location: [facility];  Service: Cardiovascular;  Laterality: N/A;  . DUODENAL STENT PLACEMENT N/A 12/06/2015   Procedure:  DUODENAL STENT PLACEMENT;  Surgeon: Carol Ada, MD;  Location: [facility];  Service: Endoscopy;  Laterality: N/A;  . ENDARTERECTOMY FEMORAL Right 06/02/2015   Procedure: ENDARTERECTOMY RIGHT PROFUNDA FEMORIS ARTERY;  Surgeon: Mal Misty, MD;  Location: [facility];  Service: Vascular;  Laterality: Right;  . ERCP N/A 12/02/2015   Procedure: ENDOSCOPIC RETROGRADE CHOLANGIOPANCREATOGRAPHY (ERCP);  Surgeon: Carol Ada, MD;  Location: [facility];  Service: Endoscopy;  Laterality: N/A;  . ESOPHAGOGASTRODUODENOSCOPY (EGD) WITH PROPOFOL N/A 12/06/2015   Procedure: ESOPHAGOGASTRODUODENOSCOPY (EGD) WITH PROPOFOL;  Surgeon: Carol Ada, MD;  Location: [facility];  Service: Endoscopy;  Laterality: N/A;  . EUS N/A 12/06/2015   Procedure: UPPER ENDOSCOPIC ULTRASOUND (EUS) LINEAR;  Surgeon: Carol Ada, MD;  Location: [facility];  Service: Endoscopy;  Laterality: N/A;  . FEMORAL-TIBIAL BYPASS GRAFT Right 06/02/2015   Procedure: RIGHT PROFUNDA FEMORIS TO POSTERIOR TIBIAL ARTERY BYPASS USING RIGHT NON-REVERSED GREATER SAPHENOUS VEIN,  SUBCUTANEOUS TUNNEL;  Surgeon: Mal Misty, MD;  Location: [facility];  Service: Vascular;  Laterality: Right;  . INTRAOPERATIVE ARTERIOGRAM Right 06/02/2015   Procedure: INTRA OPERATIVE ARTERIOGRAM RIGHT LOWER LEG;  Surgeon: Mal Misty, MD;  Location: [facility];  Service: Vascular;  Laterality: Right;  . IR GENERIC HISTORICAL  12/03/2015   IR INT EXT BILIARY DRAIN WITH CHOLANGIOGRAM 12/03/2015 [name], MD [facility]  . IR GENERIC HISTORICAL  12/22/2015   IR BILIARY STENT(S) EXISTING ACCESS INC DILATION CATH EXCHANGE 12/22/2015 Corrie Mckusick, DO [facility]  . MULTIPLE TOOTH EXTRACTIONS    . No history of  Surgery     . PERIPHERAL VASCULAR CATHETERIZATION N/A 06/01/2015   Procedure: Abdominal Aortogram;  Surgeon: Serafina Mitchell, MD;  Location: [facility];  Service: Cardiovascular;  Laterality: N/A;  . PERIPHERAL VASCULAR CATHETERIZATION N/A 07/07/2015   Procedure:  Aortic Arch Angiography;  Surgeon: Conrad Moweaqua, MD;  Location: [facility];  Service: Cardiovascular;  Laterality: N/A;  . PERIPHERAL VASCULAR CATHETERIZATION N/A 07/07/2015   Procedure: Upper Extremity Angiography;  Surgeon: Conrad Worden, MD;  Location: [facility];  Service: Cardiovascular;  Laterality: N/A;  . PERIPHERAL VASCULAR CATHETERIZATION Left 07/07/2015   Procedure: Peripheral Vascular Intervention;  Surgeon: Conrad Wausa, MD;  Location: [facility];  Service: Cardiovascular;  Laterality: Left;  left subclavian and axillary  . PORTACATH PLACEMENT N/A 12/29/2015   Procedure: INSERTION PORT-A-CATH;  Surgeon: [name], MD;  Location: [facility];  Service: General;  Laterality: N/A;  . VEIN HARVEST Right 06/02/2015   Procedure: VEIN HARVEST RIGHT GREATER SAPHENOUS VEIN;  Surgeon: Mal Misty, MD;  Location: [facility];  Service: Vascular;  Laterality: Right;   Social History   Social History  . Marital status: Single    Spouse name: N/A  . Number of children: N/A  . Years of education: N/A   Social History Main Topics  . Smoking status: Former Smoker    Packs/day: 1.00    Years: 35.00    Quit date: 05/18/2015  . Smokeless tobacco: Never Used  . Alcohol use No  . Drug use: No  . Sexual activity: Not Asked   Other Topics Concern  . None   Social History Narrative   Single, lives with girlfriend [name]   Works in [employer]   Family History  Problem Relation Age of Onset  . Heart attack Neg Hx    - negative except otherwise stated in the family history section No Known Allergies Prior to Admission medications   Medication Sig Start Date End Date Taking? Authorizing Provider  amoxicillin-clavulanate (AUGMENTIN) 875-125 MG tablet Take 1 tablet by mouth 2 (two) times daily. 01/26/16  Yes Historical Provider, MD  aspirin 81 MG EC tablet Take 1 tablet (81 mg total) by mouth daily. Swallow whole. 07/15/15  Yes Camelia Eng Tysinger, PA-C    atorvastatin (LIPITOR) 40 MG tablet Take 1 tablet (40 mg total) by mouth daily. 07/14/15  Yes Liliane Shi, PA-C  Blood Glucose Monitoring Suppl (ONETOUCH VERIO) w/Device KIT 1 kit by Does not apply route daily. 02/15/16  Yes Camelia Eng Tysinger, PA-C  collagenase (SANTYL) ointment Apply topically daily. Apply to right foot wound daily. Place moist saline gauze on top and wrap foot daily. 06/07/15  Yes Alvia Grove, PA-C  glucose blood (ONETOUCH VERIO) test strip Test twice a day 02/15/16  Yes Camelia Eng Tysinger, PA-C  lidocaine-prilocaine (EMLA) cream Apply 1 application topically as needed. Apply to port site 1 hr prior to port access and cover with plastic wrap 01/02/16  Yes Ladell Pier, MD  MICROLET LANCETS MISC Test twice a day. Pt  Uses one touch verio meter 02/15/16  Yes Camelia Eng Tysinger, PA-C  morphine (MS CONTIN) 15 MG 12 hr tablet Take 1 tablet (15 mg total) by mouth every 12 (twelve) hours. Patient taking differently: Take 15 mg by mouth 2 (two) times daily as needed for pain.  01/02/16  Yes Ladell Pier, MD  NEEDLE, DISP, 30 G (BD DISP NEEDLES) 30G X 1/2" MISC 1 each by  Does not apply route at bedtime. 11/17/15  Yes Camelia Eng Tysinger, PA-C  ciprofloxacin (CIPRO) 500 MG tablet Take 1 tablet (500 mg total) by mouth 2 (two) times daily. Patient not taking: Reported on 02/21/2016 02/15/16   Camelia Eng Tysinger, PA-C  clopidogrel (PLAVIX) 75 MG tablet Take 1 tablet (75 mg total) by mouth daily. Patient not taking: Reported on 02/21/2016 07/15/15   Camelia Eng Tysinger, PA-C  doxycycline (VIBRAMYCIN) 100 MG capsule Take 1 capsule (100 mg total) by mouth 2 (two) times daily. Patient not taking: Reported on 02/21/2016 02/15/16   Camelia Eng Tysinger, PA-C  insulin degludec (TRESIBA) 100 UNIT/ML SOPN FlexTouch Pen Inject 0.17 mLs (17 Units total) into the skin daily at 10 pm. Patient not taking: Reported on 02/21/2016 02/15/16   Camelia Eng Tysinger, PA-C  insulin glargine (LANTUS) 100 UNIT/ML injection Inject 0.14 mLs  (14 Units total) into the skin at bedtime. Patient not taking: Reported on 02/21/2016 12/09/15   Debbe Odea, MD  insulin lispro (HUMALOG KWIKPEN) 100 UNIT/ML KiwkPen Inject 0.05 mLs (5 Units total) into the skin 3 (three) times daily. Patient not taking: Reported on 02/21/2016 02/15/16   Camelia Eng Tysinger, PA-C  insulin lispro (HUMALOG) 100 UNIT/ML injection Inject 5 Units into the skin 3 (three) times daily before meals. Take 5 units at mealtime.  If you skip a meal, then just take 2.5 unites.  This is in addition to the long acting pen at night.    Historical Provider, MD   Dg Tibia/fibula Left  Result Date: 02/21/2016 CLINICAL DATA:  Diabetic ulcer distal left tibia fibula EXAM: LEFT TIBIA AND FIBULA - 2 VIEW COMPARISON:  None. FINDINGS: Four views of the left tibia fibula submitted. There is soft tissue defect/ irregularity probable ulcer anterior tibial region. There is adjacent cortical irregularity and osteo lysis anterior aspect of the tibial shaft findings are highly suspicious for osteomyelitis. Further correlation with MRI is recommended. There is soft tissue air anterior to the distal ankle. Findings may represent gangrene or necrotizing fasciitis. IMPRESSION: There is soft tissue defect/ irregularity probable ulcer mid anterior tibial region. There is adjacent cortical irregularity and osteo lysis anterior aspect of the tibial shaft findings are highly suspicious for osteomyelitis. Further correlation with MRI is recommended. There is soft tissue air anterior to the distal ankle. Findings may represent gangrene or necrotizing fasciitis. These results were called by telephone at the time of interpretation on 02/21/2016 at 1:28 pm to Dr. Carmin Muskrat , who verbally acknowledged these results. Electronically Signed   By: Lahoma Crocker M.D.   On: 02/21/2016 13:28   Dg Foot 2 Views Left  Result Date: 02/21/2016 CLINICAL DATA:  Diabetes.  Ulceration left fifth toe. EXAM: DG C-ARM 61-120 MIN-NO REPORT;  LEFT FOOT - 2 VIEW COMPARISON:  11/22/2015. FINDINGS: Soft tissue swelling noted over the left fifth digit. Erosive changes noted the base of the middle phalanx and distal aspect of the proximal phalanx of the left fifth digit. These findings suggest septic arthritis with osteomyelitis. Soft tissue air is noted over the dorsum of the foot consistent with soft tissue infection/ gangrene. Peripheral vascular calcification. IMPRESSION: 1. Findings consistent with septic arthritis and adjacent osteomyelitis involving the proximal interphalangeal joint of the left fifth digit. 2. Soft tissue swelling with diffuse soft tissue air noted over the dorsum of the foot ankle consistent with soft tissue infection/gangrene. 3.  Peripheral vascular disease. These results will be called to the ordering clinician or representative by the Radiologist Assistant, and  communication documented in the PACS or zVision Dashboard. Electronically Signed   By: Marcello Moores  Register   On: 02/21/2016 13:07   - pertinent xrays, CT, MRI studies were reviewed and independently interpreted  Positive ROS: All other systems have been reviewed and were otherwise negative with the exception of those mentioned in the HPI and as above.  Physical Exam: General: Alert, no acute distress Psychiatric: Patient is competent for consent with normal mood and affect Lymphatic: No axillary or cervical lymphadenopathy Cardiovascular: No pedal edema Respiratory: No cyanosis, no use of accessory musculature GI: No organomegaly, abdomen is soft and non-tender  Skin: Patient has chronic gangrenous changes over the distal tibia. There is exposed tibia there is necrotic tendon. Patient has dry gangrenous changes over the dorsum of the foot.   Neurologic: Patient does not have protective sensation bilateral lower extremities.   MUSCULOSKELETAL:  Patient has a palpable dorsalis pedis pulse. There is odor elevated white cell count. Radiographs shows chronic  osteomyelitis at the junction of the middle and distal tibia  Assessment: Assessment: Diabetic insensate neuropathy with peripheral vascular disease with pancreatic cancer currently on chemotherapy with chronic osteomyelitis ulceration and gangrene of the left lower extremity  Plan: Lan: Plan for transtibial amputation on the left. We'll set this up as soon as possible continue IV antibiotics until surgery is scheduled. We will plan on surgery at [facility] or [name] depending on operating room availability.  Thank you for the consult and the opportunity to see Mr. Zadin Lange, [place] [phone] 6:17 PM

## 2016-02-23 NOTE — Op Note (Signed)
   Date of Surgery: 02/23/2016  INDICATIONS: Mr. Windmiller is a 55 y.o.-year-old male who was treated wound Center for 30 weeks. Patient presented with necrotic foul-smelling left lower extremity with ulceration exposed bone and exposed tendon.Marland Kitchen  PREOPERATIVE DIAGNOSIS: Gangrene left lower extremity  POSTOPERATIVE DIAGNOSIS: Same.  PROCEDURE: Transtibial amputation Application of Prevena wound VAC  SURGEON: Sharol Given, M.D.  ANESTHESIA:  general  IV FLUIDS AND URINE: See anesthesia.  ESTIMATED BLOOD LOSS: Minimal mL.  COMPLICATIONS: None.  DESCRIPTION OF PROCEDURE: The patient was brought to the operating room and underwent a general anesthetic. After adequate levels of anesthesia were obtained patient's lower extremity was prepped using DuraPrep draped into a sterile field. A timeout was called. The foot was draped out of the sterile field with impervious stockinette. A transverse incision was made 11 cm distal to the tibial tubercle. This curved proximally and a large posterior flap was created. The tibia was transected 1 cm proximal to the skin incision. The fibula was transected just proximal to the tibial incision. The tibia was beveled anteriorly. A large posterior flap was created. The sciatic nerve was pulled cut and allowed to retract. The vascular bundles were suture ligated with 2-0 silk. The deep and superficial fascial layers were closed using #1 Vicryl. The skin was closed using staples and 2-0 nylon. The wound was covered with a Prevena wound VAC. There was a good suction fit. . Patient was extubated taken to the PACU in stable condition.   Plan for continue IV antibiotics for 48 hours postoperatively. Orders written for consideration for discharge to skilled nursing postoperatively. Meridee Score, MD [place] 6:00 PM

## 2016-02-23 NOTE — Progress Notes (Signed)
Patient ID: EGE MAVITY, male   DOB: 1960/09/29, 55 y.o.   MRN: [id number]    PROGRESS NOTE    Donald Steele  [id number] DOB: 04/25/1960 DOA: 02/21/2016  PCP: Crisoforo Oxford, PA-C   Brief Narrative:  55 y.o. male with medical history significant of uncontrolled diabetes mellitus with secondary peripheral vascular disease with chronic left lower extremity foot ulcer that has been managed by wound care. Patient states a few days ago, he dropped a plate of hot spaghetti on the wound.   Assessment & Plan: Sepsis due to Osteomyelitis of left foot and ankle - Evaluated by orthopedics Dr. Sharol Given. MRI of foot reviewed. Plan for left BKA - continue vancomycin and zosyn for now  - will need PT eval post op  Type 2 diabetes mellitus with diabetic peripheral angiopathy and gangrene - with long-term current use of insulin: Continue current insulin regimen.  - Monitor blood sugar level. Diabetic diet.    Chronic combined systolic and diastolic CHF (congestive heart failure) ([place]) - Patient is currently euvolemic. He does not have chest pain. Stable.  Cancer of head of pancreas ([place]) - Advised outpatient follow-up with oncologist.  Hypokalemia - supplement, BMP in AM  DVT prophylaxis: Was on Lovenox but this was held pre op.  Code Status: Full  Family Communication: Patient at bedside  Disposition Plan: To be determined   Consultants:   Ortho   Procedures:   Left BKA  Antimicrobials:   Vancomycin 11/07  Zosyn 11/07  Subjective: No events overnight, wants to eat.   Objective: Vitals:   02/22/16 1349 02/22/16 1830 02/22/16 2157 02/23/16 0653  BP: 124/64 114/66 125/64 108/64  Pulse: 98 (!) 102 (!) 111 83  Resp: 15 16 16 16   Temp: 98.6 F (37 C) 98.9 F (37.2 C) 98.2 F (36.8 C) 98.3 F (36.8 C)  TempSrc: Oral  Oral Oral  SpO2: 100% 100% 100% 98%  Weight:      Height:        Intake/Output Summary (Last 24 hours) at 02/23/16 1043 Last data filed at  02/23/16 0410  Gross per 24 hour  Intake              130 ml  Output              450 ml  Net             -320 ml   Filed Weights   02/21/16 1109 02/21/16 1720  Weight: 81.2 kg (179 lb) 78.9 kg (174 lb)    Examination:  General exam: Appears calm and comfortable  Respiratory system: Clear to auscultation. Respiratory effort normal. Cardiovascular system: S1 & S2 heard, RRR. No JVD, murmurs, rubs, gallops or clicks. No pedal edema. Gastrointestinal system: Abdomen is nondistended, soft and nontender. No organomegaly or masses felt.   Data Reviewed: I have personally reviewed following labs and imaging studies  CBC:  Recent Labs Lab 02/21/16 1226 02/21/16 1947 02/22/16 0520 02/23/16 0500  WBC 23.3* 27.9* 26.8* 26.8*  NEUTROABS 18.4* 22.1*  --   --   HGB 8.8* 9.5* 8.7* 8.4*  HCT 27.5* 29.1* 26.9* 26.7*  MCV 78.3 78.4 78.2 78.3  PLT 581* 612* 628* [id number]*   Basic Metabolic Panel:  Recent Labs Lab 02/21/16 1226 02/22/16 0520 02/23/16 0500  NA 130* 134* 134*  K 3.3* 3.2* 3.4*  CL 94* 95* 98*  CO2 28 30 30   GLUCOSE 266* 219* 117*  BUN 7 6 5*  CREATININE 0.74  0.67 0.84  CALCIUM 8.4* 8.4* 8.2*   Liver Function Tests:  Recent Labs Lab 02/21/16 1226  AST 13*  ALT 10*  ALKPHOS 133*  BILITOT 0.8  PROT 7.0  ALBUMIN 2.5*    Recent Labs Lab 02/21/16 1226  LIPASE 11   CBG:  Recent Labs Lab 02/22/16 0805 02/22/16 1216 02/22/16 1620 02/22/16 2201 02/23/16 0652  GLUCAP 211* 175* 140* 142* 83   Urine analysis:    Component Value Date/Time   COLORURINE YELLOW 12/27/2015 1435   APPEARANCEUR CLEAR 12/27/2015 1435   LABSPEC 1.015 12/27/2015 1435   PHURINE 5.5 12/27/2015 1435   GLUCOSEU NEGATIVE 12/27/2015 1435   HGBUR NEGATIVE 12/27/2015 1435   BILIRUBINUR NEGATIVE 12/27/2015 1435   BILIRUBINUR n 11/15/2015 1249   KETONESUR NEGATIVE 12/27/2015 1435   PROTEINUR NEGATIVE 12/27/2015 1435   UROBILINOGEN 0.2 11/15/2015 1249   NITRITE NEGATIVE 12/27/2015  1435   LEUKOCYTESUR NEGATIVE 12/27/2015 1435   Recent Results (from the past 240 hour(s))  Culture, blood (routine x 2)     Status: None (Preliminary result)   Collection Time: 02/21/16 12:30 PM  Result Value Ref Range Status   Specimen Description BLOOD PORTA CATH  Final   Special Requests BOTTLES DRAWN AEROBIC AND ANAEROBIC 5ML  Final   Culture   Final    NO GROWTH < 24 HOURS Performed at [facility]    Report Status PENDING  Incomplete  Culture, blood (routine x 2)     Status: None (Preliminary result)   Collection Time: 02/21/16  2:04 PM  Result Value Ref Range Status   Specimen Description BLOOD RIGHT HAND  Final   Special Requests IN PEDIATRIC BOTTLE [place]  Final   Culture   Final    NO GROWTH 1 DAY Performed at [facility]    Report Status PENDING  Incomplete  Surgical PCR screen     Status: None   Collection Time: 02/22/16  4:30 AM  Result Value Ref Range Status   MRSA, PCR NEGATIVE NEGATIVE Final   Staphylococcus aureus NEGATIVE NEGATIVE Final    Comment:        The Xpert SA Assay (FDA approved for NASAL specimens in patients over 57 years of age), is one component of a comprehensive surveillance program.  Test performance has been validated by [facility] for patients greater than or equal to 6 year old. It is not intended to diagnose infection nor to guide or monitor treatment.       Radiology Studies: Dg Tibia/fibula Left  Result Date: 02/21/2016 CLINICAL DATA:  Diabetic ulcer distal left tibia fibula EXAM: LEFT TIBIA AND FIBULA - 2 VIEW COMPARISON:  None. FINDINGS: Four views of the left tibia fibula submitted. There is soft tissue defect/ irregularity probable ulcer anterior tibial region. There is adjacent cortical irregularity and osteo lysis anterior aspect of the tibial shaft findings are highly suspicious for osteomyelitis. Further correlation with MRI is recommended. There is soft tissue air anterior to the distal ankle. Findings may  represent gangrene or necrotizing fasciitis. IMPRESSION: There is soft tissue defect/ irregularity probable ulcer mid anterior tibial region. There is adjacent cortical irregularity and osteo lysis anterior aspect of the tibial shaft findings are highly suspicious for osteomyelitis. Further correlation with MRI is recommended. There is soft tissue air anterior to the distal ankle. Findings may represent gangrene or necrotizing fasciitis. These results were called by telephone at the time of interpretation on 02/21/2016 at 1:28 pm to Dr. Carmin Muskrat , who verbally  acknowledged these results. Electronically Signed   By: Lahoma Crocker M.D.   On: 02/21/2016 13:28   Mr Tibia Fibula Left Wo Contrast  Result Date: 02/22/2016 CLINICAL DATA:  Chronic lower leg wound/infection. EXAM: MRI OF LOWER LEFT EXTREMITY WITHOUT CONTRAST; MRI OF THE LEFT FOREFOOT WITHOUT AND WITH CONTRAST; MRI OF THE LEFT ANKLE WITHOUT AND WITH CONTRAST TECHNIQUE: Multiplanar, multisequence MR imaging of the left lower extremity was performed both before and after administration of intravenous contrast. CONTRAST:  59mL MULTIHANCE GADOBENATE DIMEGLUMINE 529 MG/ML IV SOLN COMPARISON:  Radiographs, same date. FINDINGS: Chronic open wound involving the lower tibial area anteriorly with air dissecting into the subcutaneous soft tissues down into the ankle area. Diffuse cellulitis and myofasciitis without discrete drainable soft tissue abscess. Diffuse cortical thickening involving the anterior aspect of the tibia with a small focus of mild increased STIR signal intensity in the thickened cortex in the area of the open wound. No significant marrow edema but there appears to be slight enhancement. Findings suspicious for osteomyelitis. Moderate surrounding myofasciitis. There is osteomyelitis involving the proximal and middle phalanges of the fifth toe with septic arthritis at the proximal IP joint. Possible early changes of osteomyelitis also involving  the fourth proximal phalanx. No findings for septic arthritis at the ankle joint. There are moderate midfoot degenerative changes but no definite findings for septic arthritis in this area. IMPRESSION: 1. Large open wound along the anterior aspect of the lower tibia with findings suspicious for early osteomyelitis involving the anterior cortex. 2. Diffuse cellulitis and myofasciitis without discrete drainable soft tissue abscess or pyomyositis. 3. The septic arthritis involving the proximal interphalangeal joint of the fifth toe with osteomyelitis involving the proximal and middle phalanges. Suspect osteomyelitis also involving the fourth proximal phalanx. Electronically Signed   By: Marijo Sanes M.D.   On: 02/22/2016 08:15   Mr Foot Left W Wo Contrast  Result Date: 02/22/2016 CLINICAL DATA:  Chronic lower leg wound/infection. EXAM: MRI OF LOWER LEFT EXTREMITY WITHOUT CONTRAST; MRI OF THE LEFT FOREFOOT WITHOUT AND WITH CONTRAST; MRI OF THE LEFT ANKLE WITHOUT AND WITH CONTRAST TECHNIQUE: Multiplanar, multisequence MR imaging of the left lower extremity was performed both before and after administration of intravenous contrast. CONTRAST:  63mL MULTIHANCE GADOBENATE DIMEGLUMINE 529 MG/ML IV SOLN COMPARISON:  Radiographs, same date. FINDINGS: Chronic open wound involving the lower tibial area anteriorly with air dissecting into the subcutaneous soft tissues down into the ankle area. Diffuse cellulitis and myofasciitis without discrete drainable soft tissue abscess. Diffuse cortical thickening involving the anterior aspect of the tibia with a small focus of mild increased STIR signal intensity in the thickened cortex in the area of the open wound. No significant marrow edema but there appears to be slight enhancement. Findings suspicious for osteomyelitis. Moderate surrounding myofasciitis. There is osteomyelitis involving the proximal and middle phalanges of the fifth toe with septic arthritis at the proximal IP  joint. Possible early changes of osteomyelitis also involving the fourth proximal phalanx. No findings for septic arthritis at the ankle joint. There are moderate midfoot degenerative changes but no definite findings for septic arthritis in this area. IMPRESSION: 1. Large open wound along the anterior aspect of the lower tibia with findings suspicious for early osteomyelitis involving the anterior cortex. 2. Diffuse cellulitis and myofasciitis without discrete drainable soft tissue abscess or pyomyositis. 3. The septic arthritis involving the proximal interphalangeal joint of the fifth toe with osteomyelitis involving the proximal and middle phalanges. Suspect osteomyelitis also involving the fourth proximal  phalanx. Electronically Signed   By: Marijo Sanes M.D.   On: 02/22/2016 08:15   Mr Ankle Left W Wo Contrast  Result Date: 02/22/2016 CLINICAL DATA:  Chronic lower leg wound/infection. EXAM: MRI OF LOWER LEFT EXTREMITY WITHOUT CONTRAST; MRI OF THE LEFT FOREFOOT WITHOUT AND WITH CONTRAST; MRI OF THE LEFT ANKLE WITHOUT AND WITH CONTRAST TECHNIQUE: Multiplanar, multisequence MR imaging of the left lower extremity was performed both before and after administration of intravenous contrast. CONTRAST:  60mL MULTIHANCE GADOBENATE DIMEGLUMINE 529 MG/ML IV SOLN COMPARISON:  Radiographs, same date. FINDINGS: Chronic open wound involving the lower tibial area anteriorly with air dissecting into the subcutaneous soft tissues down into the ankle area. Diffuse cellulitis and myofasciitis without discrete drainable soft tissue abscess. Diffuse cortical thickening involving the anterior aspect of the tibia with a small focus of mild increased STIR signal intensity in the thickened cortex in the area of the open wound. No significant marrow edema but there appears to be slight enhancement. Findings suspicious for osteomyelitis. Moderate surrounding myofasciitis. There is osteomyelitis involving the proximal and middle  phalanges of the fifth toe with septic arthritis at the proximal IP joint. Possible early changes of osteomyelitis also involving the fourth proximal phalanx. No findings for septic arthritis at the ankle joint. There are moderate midfoot degenerative changes but no definite findings for septic arthritis in this area. IMPRESSION: 1. Large open wound along the anterior aspect of the lower tibia with findings suspicious for early osteomyelitis involving the anterior cortex. 2. Diffuse cellulitis and myofasciitis without discrete drainable soft tissue abscess or pyomyositis. 3. The septic arthritis involving the proximal interphalangeal joint of the fifth toe with osteomyelitis involving the proximal and middle phalanges. Suspect osteomyelitis also involving the fourth proximal phalanx. Electronically Signed   By: Marijo Sanes M.D.   On: 02/22/2016 08:15   Dg Foot 2 Views Left  Result Date: 02/21/2016 CLINICAL DATA:  Diabetes.  Ulceration left fifth toe. EXAM: DG C-ARM 61-120 MIN-NO REPORT; LEFT FOOT - 2 VIEW COMPARISON:  11/22/2015. FINDINGS: Soft tissue swelling noted over the left fifth digit. Erosive changes noted the base of the middle phalanx and distal aspect of the proximal phalanx of the left fifth digit. These findings suggest septic arthritis with osteomyelitis. Soft tissue air is noted over the dorsum of the foot consistent with soft tissue infection/ gangrene. Peripheral vascular calcification. IMPRESSION: 1. Findings consistent with septic arthritis and adjacent osteomyelitis involving the proximal interphalangeal joint of the left fifth digit. 2. Soft tissue swelling with diffuse soft tissue air noted over the dorsum of the foot ankle consistent with soft tissue infection/gangrene. 3.  Peripheral vascular disease. These results will be called to the ordering clinician or representative by the Radiologist Assistant, and communication documented in the PACS or zVision Dashboard. Electronically Signed    By: Marcello Moores  Register   On: 02/21/2016 13:07      Scheduled Meds: . atorvastatin  40 mg Oral Daily  .  ceFAZolin (ANCEF) IV  2 g Intravenous On Call to OR  . chlorhexidine  60 mL Topical Once  . docusate sodium  100 mg Oral BID  . insulin aspart  0-5 Units Subcutaneous QHS  . insulin aspart  0-9 Units Subcutaneous TID WC  . insulin detemir  10 Units Subcutaneous QHS  . piperacillin-tazobactam (ZOSYN)  IV  3.375 g Intravenous Q8H  . senna  1 tablet Oral BID  . vancomycin  1,000 mg Intravenous Q8H   Continuous Infusions: . sodium chloride  LOS: 2 days   Time spent: 20 minutes   Faye Ramsay, MD [facility] Pager [phone]  If 7PM-7AM, please contact night-coverage [email or web address] Password [email or web address] 02/23/2016, 10:43 AM

## 2016-02-23 NOTE — Anesthesia Procedure Notes (Signed)
Procedure Name: Intubation Date/Time: 02/23/2016 5:11 PM Performed by: Sampson Si E Pre-anesthesia Checklist: Patient identified, Emergency Drugs available, Suction available and Patient being monitored Patient Re-evaluated:Patient Re-evaluated prior to inductionOxygen Delivery Method: Circle System Utilized Preoxygenation: Pre-oxygenation with 100% oxygen Intubation Type: IV induction Ventilation: Mask ventilation without difficulty Laryngoscope Size: Mac and 3 Grade View: Grade I Tube type: Oral Tube size: 7.5 mm Number of attempts: 1 Airway Equipment and Method: Stylet and Oral airway Placement Confirmation: ETT inserted through vocal cords under direct vision,  positive ETCO2 and breath sounds checked- equal and bilateral Secured at: 22 cm Tube secured with: Tape Dental Injury: Teeth and Oropharynx as per pre-operative assessment

## 2016-02-23 NOTE — Progress Notes (Signed)
Initial Nutrition Assessment  DOCUMENTATION CODES:   Severe malnutrition in context of chronic illness  INTERVENTION:  Once diet advances,   Provide Ensure Enlive po BID, each supplement provides 350 kcal and 20 grams of protein.  Provide 30 ml Prostat po BID, each supplement provides 100 kcal and 15 grams of protein.   NUTRITION DIAGNOSIS:   Malnutrition related to chronic illness as evidenced by severe depletion of body fat, severe depletion of muscle mass.  GOAL:   Patient will meet greater than or equal to 90% of their needs  MONITOR:   Supplement acceptance, Diet advancement, Labs, Weight trends, Skin, I & O's  REASON FOR ASSESSMENT:   Consult Wound healing  ASSESSMENT:   55 year old male with history of uncontrolled diabetes, peripheral vascular disease with chronic left lower extremity foot ulcer that has been managed at wound care center, sent to ER for further evaluation of worsening on. MRI of left foot consistent with cellulitis, myofascitis and osteomyelitis.   Pt is currently NPO for surgery today. Plan for L BKA. Pt reports appetite has been "so so". He reports usual consumption of at least 2 meals a day (mostly canned soup) with Ensure 1-2 daily. Usual body weight reported to be ~174 lbs. Weight has been fluctuating however mostly stable. RD to order Ensure and Prost once diet advances to aid in wound healing.   Nutrition-Focused physical exam completed. Findings are severe fat depletion, severe muscle depletion, and no edema.   Labs and medications reviewed.   Diet Order:  Diet NPO time specified  Skin:  Wound (see comment) (Diabetic ulcer L foot with gangrene)  Last BM:  11/5  Height:   Ht Readings from Last 1 Encounters:  02/21/16 5\' 11"  (1.803 m)    Weight:   Wt Readings from Last 1 Encounters:  02/21/16 174 lb (78.9 kg)    Ideal Body Weight:  78 kg  BMI:  Body mass index is 24.27 kg/m.  Estimated Nutritional Needs:   Kcal:   2200-2400  Protein:  110-120 grams  Fluid:  2.2 - 2.4 L/day  EDUCATION NEEDS:   No education needs identified at this time  Corrin Parker, MS, RD, LDN Pager # [phone] After hours/ weekend pager # [phone]

## 2016-02-23 NOTE — Anesthesia Postprocedure Evaluation (Signed)
Anesthesia Post Note  Patient: Donald Steele  Procedure(s) Performed: Procedure(s) (LRB): LEFT BELOW KNEE AMPUTATION (Left)  Patient location during evaluation: PACU Anesthesia Type: General Level of consciousness: sedated and patient cooperative Pain management: pain level controlled Vital Signs Assessment: post-procedure vital signs reviewed and stable Respiratory status: spontaneous breathing Cardiovascular status: stable Anesthetic complications: no     Last Vitals:  Vitals:   02/23/16 1816 02/23/16 1821  BP: (!) 156/82 (!) 158/80  Pulse: 100 100  Resp: 14 18  Temp:  36.8 C    Last Pain:  Vitals:   02/23/16 1846  TempSrc:   PainSc: 0-No pain   Pain Goal: Patients Stated Pain Goal: 3 (02/22/16 2246)               Nolon Nations

## 2016-02-23 NOTE — Progress Notes (Signed)
SCD's ordered by unit sec.

## 2016-02-23 NOTE — Anesthesia Preprocedure Evaluation (Signed)
Anesthesia Evaluation  Patient identified by MRN, date of birth, ID band Patient awake    Reviewed: Allergy & Precautions, NPO status , Patient's Chart, lab work & pertinent test results  Airway        Dental   Pulmonary former smoker,           Cardiovascular hypertension, + CAD, + Peripheral Vascular Disease and +CHF       Neuro/Psych negative neurological ROS  negative psych ROS   GI/Hepatic negative GI ROS, Neg liver ROS,   Endo/Other  diabetes, Type 2, Insulin Dependent  Renal/GU negative Renal ROS  negative genitourinary   Musculoskeletal negative musculoskeletal ROS (+)   Abdominal   Peds negative pediatric ROS (+)  Hematology negative hematology ROS (+)   Anesthesia Other Findings - HLD  Reproductive/Obstetrics negative OB ROS                             Anesthesia Physical Anesthesia Plan  ASA: III  Anesthesia Plan: General   Post-op Pain Management:    Induction: Intravenous  Airway Management Planned: Oral ETT  Additional Equipment:   Intra-op Plan:   Post-operative Plan: Extubation in OR  Informed Consent: I have reviewed the patients History and Physical, chart, labs and discussed the procedure including the risks, benefits and alternatives for the proposed anesthesia with the patient or authorized representative who has indicated his/her understanding and acceptance.   Dental advisory given  Plan Discussed with: CRNA  Anesthesia Plan Comments:         Anesthesia Quick Evaluation

## 2016-02-24 ENCOUNTER — Encounter (HOSPITAL_COMMUNITY): Payer: Self-pay | Admitting: Orthopedic Surgery

## 2016-02-24 LAB — BASIC METABOLIC PANEL
Anion gap: 9 (ref 5–15)
BUN: 7 mg/dL (ref 6–20)
CALCIUM: 8.4 mg/dL — AB (ref 8.9–10.3)
CHLORIDE: 101 mmol/L (ref 101–111)
CO2: 27 mmol/L (ref 22–32)
CREATININE: 0.9 mg/dL (ref 0.61–1.24)
GFR calc non Af Amer: >60 mL/min (ref >60)
Glucose, Bld: 70 mg/dL (ref 65–99)
Potassium: 3.8 mmol/L (ref 3.5–5.1)
SODIUM: 137 mmol/L (ref 135–145)

## 2016-02-24 LAB — GLUCOSE, CAPILLARY
GLUCOSE-CAPILLARY: 95 mg/dL (ref 65–99)
GLUCOSE-CAPILLARY: 218 mg/dL — AB (ref 65–99)
Glucose-Capillary: 59 mg/dL — ABNORMAL LOW (ref 65–99)
Glucose-Capillary: 56 mg/dL — ABNORMAL LOW (ref 65–99)
Glucose-Capillary: 228 mg/dL — ABNORMAL HIGH (ref 65–99)
Glucose-Capillary: 150 mg/dL — ABNORMAL HIGH (ref 65–99)

## 2016-02-24 LAB — CBC
HCT: 25.4 % — ABNORMAL LOW (ref 39.0–52.0)
Hemoglobin: 7.9 g/dL — ABNORMAL LOW (ref 13.0–17.0)
MCH: 24.5 pg — AB (ref 26.0–34.0)
MCHC: 31.1 g/dL (ref 30.0–36.0)
MCV: 78.9 fL (ref 78.0–100.0)
Platelets: 615 10*3/uL — ABNORMAL HIGH (ref 150–400)
RBC: 3.22 MIL/uL — ABNORMAL LOW (ref 4.22–5.81)
RDW: 16.4 % — AB (ref 11.5–15.5)
WBC: 17.3 10*3/uL — ABNORMAL HIGH (ref 4.0–10.5)

## 2016-02-24 NOTE — Progress Notes (Signed)
Patient ID: Donald Steele, male   DOB: 1960-12-19, 55 y.o.   MRN: [id number]    PROGRESS NOTE    Donald Steele  [id number] DOB: August 14, 1960 DOA: 02/21/2016  PCP: Crisoforo Oxford, PA-C   Brief Narrative:  55 y.o. male with medical history significant of uncontrolled diabetes mellitus with secondary peripheral vascular disease with chronic left lower extremity foot ulcer that has been managed by wound care. Patient states a few days ago, he dropped a plate of hot spaghetti on the wound.   Assessment & Plan: Sepsis due to Osteomyelitis of left foot and ankle - Evaluated by orthopedics Dr. Sharol Given. MRI of foot reviewed.  - Postoperative day 1 left transtibial amputation.  - pt reports less pain - plan for PT eval to determine appropriate discharge plan  - continue ABX, WBC is trending down - CBC in AM  Type 2 diabetes mellitus with diabetic peripheral angiopathy and gangrene - with long-term current use of insulin: Continue current insulin regimen.  - Monitor blood sugar level. Diabetic diet.    Chronic combined systolic and diastolic CHF (congestive heart failure) ([place]) - Patient is currently euvolemic. He does not have chest pain. Stable.  Cancer of head of pancreas ([place]) - Advised outpatient follow-up with oncologist.  Hypokalemia - supplement, BMP in AM  Anemia of chronic disease - drop in Hg post op - no indicated for transfusion at this time - repeat CBC in AM  DVT prophylaxis: Was on Lovenox but this was held pre op.  Code Status: Full  Family Communication: Patient at bedside  Disposition Plan: To be determined   Consultants:   Ortho   Procedures:   Left transtibial amputation 11/09 -->  Antimicrobials:   Vancomycin 11/07  Zosyn 11/07  Subjective: No events overnight, wants to eat.   Objective: Vitals:   02/23/16 2049 02/24/16 0138 02/24/16 0621 02/24/16 1248  BP: 132/73 134/68 124/69 (!) 89/51  Pulse: 99 93 85 92  Resp: 18 16 16 16     Temp: 98.1 F (36.7 C) 98 F (36.7 C) 97.8 F (36.6 C) 97.8 F (36.6 C)  TempSrc: Oral Oral Oral Oral  SpO2: 99% 99% 100% 96%  Weight:      Height:        Intake/Output Summary (Last 24 hours) at 02/24/16 1617 Last data filed at 02/24/16 1300  Gross per 24 hour  Intake             1290 ml  Output              700 ml  Net              590 ml   Filed Weights   02/21/16 1109 02/21/16 1720  Weight: 81.2 kg (179 lb) 78.9 kg (174 lb)    Examination:  General exam: Appears calm and comfortable  Respiratory system: Clear to auscultation. Respiratory effort normal. Cardiovascular system: S1 & S2 heard, RRR. No JVD, murmurs, rubs, gallops or clicks. No pedal edema. Gastrointestinal system: Abdomen is nondistended, soft and nontender. No organomegaly or masses felt.   Data Reviewed: I have personally reviewed following labs and imaging studies  CBC:  Recent Labs Lab 02/21/16 1226 02/21/16 1947 02/22/16 0520 02/23/16 0500 02/24/16 0500  WBC 23.3* 27.9* 26.8* 26.8* 17.3*  NEUTROABS 18.4* 22.1*  --   --   --   HGB 8.8* 9.5* 8.7* 8.4* 7.9*  HCT 27.5* 29.1* 26.9* 26.7* 25.4*  MCV 78.3 78.4 78.2 78.3 78.9  PLT 581* 612* 628* 625* [id number]*   Basic Metabolic Panel:  Recent Labs Lab 02/21/16 1226 02/22/16 0520 02/23/16 0500 02/24/16 0500  NA 130* 134* 134* 137  K 3.3* 3.2* 3.4* 3.8  CL 94* 95* 98* 101  CO2 28 30 30 27   GLUCOSE 266* 219* 117* 70  BUN 7 6 5* 7  CREATININE 0.74 0.67 0.84 0.90  CALCIUM 8.4* 8.4* 8.2* 8.4*   Liver Function Tests:  Recent Labs Lab 02/21/16 1226  AST 13*  ALT 10*  ALKPHOS 133*  BILITOT 0.8  PROT 7.0  ALBUMIN 2.5*    Recent Labs Lab 02/21/16 1226  LIPASE 11   CBG:  Recent Labs Lab 02/23/16 2052 02/24/16 0624 02/24/16 0727 02/24/16 0856 02/24/16 1125  GLUCAP 107* 59* 56* 95 228*   Urine analysis:    Component Value Date/Time   COLORURINE YELLOW 12/27/2015 1435   APPEARANCEUR CLEAR 12/27/2015 1435   LABSPEC 1.015  12/27/2015 1435   PHURINE 5.5 12/27/2015 1435   GLUCOSEU NEGATIVE 12/27/2015 1435   HGBUR NEGATIVE 12/27/2015 1435   BILIRUBINUR NEGATIVE 12/27/2015 1435   BILIRUBINUR n 11/15/2015 1249   KETONESUR NEGATIVE 12/27/2015 1435   PROTEINUR NEGATIVE 12/27/2015 1435   UROBILINOGEN 0.2 11/15/2015 1249   NITRITE NEGATIVE 12/27/2015 1435   LEUKOCYTESUR NEGATIVE 12/27/2015 1435   Recent Results (from the past 240 hour(s))  Culture, blood (routine x 2)     Status: None (Preliminary result)   Collection Time: 02/21/16 12:30 PM  Result Value Ref Range Status   Specimen Description BLOOD PORTA CATH  Final   Special Requests BOTTLES DRAWN AEROBIC AND ANAEROBIC 5ML  Final   Culture   Final    NO GROWTH 3 DAYS Performed at [facility]    Report Status PENDING  Incomplete  Culture, blood (routine x 2)     Status: None (Preliminary result)   Collection Time: 02/21/16  2:04 PM  Result Value Ref Range Status   Specimen Description BLOOD RIGHT HAND  Final   Special Requests IN PEDIATRIC BOTTLE 1CC  Final   Culture   Final    NO GROWTH 3 DAYS Performed at [facility]    Report Status PENDING  Incomplete  Surgical PCR screen     Status: None   Collection Time: 02/22/16  4:30 AM  Result Value Ref Range Status   MRSA, PCR NEGATIVE NEGATIVE Final   Staphylococcus aureus NEGATIVE NEGATIVE Final    Comment:        The Xpert SA Assay (FDA approved for NASAL specimens in patients over 52 years of age), is one component of a comprehensive surveillance program.  Test performance has been validated by [facility] for patients greater than or equal to 55 year old. It is not intended to diagnose infection nor to guide or monitor treatment.       Radiology Studies: No results found.    Scheduled Meds: . atorvastatin  40 mg Oral Daily  . docusate sodium  100 mg Oral BID  . feeding supplement (ENSURE ENLIVE)  237 mL Oral BID BM  . feeding supplement (PRO-STAT SUGAR FREE 64)  30  mL Oral BID  . insulin aspart  0-5 Units Subcutaneous QHS  . insulin aspart  0-9 Units Subcutaneous TID WC  . insulin detemir  10 Units Subcutaneous QHS  . piperacillin-tazobactam (ZOSYN)  IV  3.375 g Intravenous Q8H  . senna  1 tablet Oral BID  . vancomycin  1,000 mg Intravenous Q8H  Continuous Infusions: . sodium chloride    . lactated ringers 10 mL/hr at 02/23/16 1533     LOS: 3 days   Time spent: 20 minutes   Faye Ramsay, MD [facility] Pager [phone]  If 7PM-7AM, please contact night-coverage [email or web address] Password [email or web address] 02/24/2016, 4:17 PM

## 2016-02-24 NOTE — Care Management Note (Signed)
Case Management Note  Patient Details  Name: Donald Steele MRN: [id number] Date of Birth: May 07, 1960  Subjective/Objective: 55 yr old gentleman s/p left BKA with Prevena wound Vac application.                    Action/Plan: Case manager spoke with patient concerning possible Home Health needs. Patient agreeable to CM contacting his daughter-[name]- [phone]. [name] states patient will need hospital bed, wheelchair and 3in1, all DME has been ordered. Patient has received Home Health through [place] in the past, will do so now. Case Manager called referral to [employer]. Patient states his mom and his daughter will assist him at home.    Expected Discharge Date:   (unknown)               Expected Discharge Plan:  [place]  In-House Referral:  NA  Discharge planning Services  CM Consult  Post Acute Care Choice:  Durable Medical Equipment, Home Health Choice offered to:  Adult Children, Patient  DME Arranged:  3-N-1, Wheelchair manual, Hospital bed DME Agency:  [place]:  PT, OT [facility] Agency:  [place]  Status of Service:  In process, will continue to follow  If discussed at Long Length of Stay Meetings, dates discussed:    Additional Comments:  Ninfa Meeker, RN 02/24/2016, 10:47 AM

## 2016-02-24 NOTE — Progress Notes (Signed)
Pharmacy Antibiotic Note  Donald Steele is a 55 y.o. male admitted on 02/21/2016 with cellulitis/nonhealing wound in cancer patient currently on chemo treatment.  Orthopedic consult reports chronic osteomyelitis ulceration and gangrene of the left lower extremity now s/p left transtibial amputation.  Pharmacy has been consulted for vancomycin and Zosyn dosing - to stop 48 hrs post-op per Dr. Jess Barters op note.  Plan:  Zosyn 3.375g IV Q8H infused over 4hrs  Vancomycin 1g IV q8h.  Goal VT 15-20 mcg/ml  Stop date set for 11/11 at 18:00   Height: 5\' 11"  (180.3 cm) Weight: 174 lb (78.9 kg) IBW/kg (Calculated) : 75.3  Temp (24hrs), Avg:98.2 F (36.8 C), Min:97.8 F (36.6 C), Max:98.7 F (37.1 C)   Recent Labs Lab 02/21/16 1226 02/21/16 1240 02/21/16 1947 02/22/16 0520 02/23/16 0500 02/24/16 0500  WBC 23.3*  --  27.9* 26.8* 26.8* 17.3*  CREATININE 0.74  --   --  0.67 0.84 0.90  LATICACIDVEN  --  1.12  --   --   --   --     Estimated Creatinine Clearance: 97.6 mL/min (by C-G formula based on SCr of 0.9 mg/dL).    No Known Allergies   Thank you for allowing pharmacy to be a part of this patient's care.  Renold Genta, PharmD, BCPS Clinical Pharmacist Phone for today - [place] - [phone] 02/24/2016 11:33 AM

## 2016-02-24 NOTE — Progress Notes (Signed)
Patient ID: Donald Steele, male   DOB: 10-16-1960, 55 y.o.   MRN: [id number] Postoperative day 1 left transtibial amputation. Patient is in less pain this morning. Plan for physical therapy progressive ambulation. Discussed with patient if he is able to ambulate safely with therapy he home with home health physical therapy otherwise patient would need placement in rehabilitation.

## 2016-02-24 NOTE — Evaluation (Signed)
Physical Therapy Evaluation Patient Details Name: Donald Steele MRN: [id number] DOB: 1960-12-26 Today's Date: 02/24/2016   History of Present Illness  55 y/o male s/p L transtibial amputation (02/23/16) due to non healing wound.  Pt was recently diagnosed in August with pancreatic CA and had last treatment in September.  PMH includes PVD, HTN, CHF, DM  Clinical Impression  Pt admitted with above diagnosis. Pt currently with functional limitations due to the deficits listed below (see PT Problem List).  Pt will benefit from skilled PT to increase their independence and safety with mobility to allow discharge to the venue listed below.  Recommend SNF for rehab due to new L BKA and stairs to enter home and to get to bedroom.  Pt also has limited caregiver A.  Will follow acutely.     Follow Up Recommendations SNF;Supervision for mobility/OOB    Equipment Recommendations  Wheelchair (measurements PT);Wheelchair cushion (measurements PT)    Recommendations for Other Services       Precautions / Restrictions Precautions Precautions: Fall Precaution Comments: L BKA, wound vac Restrictions LLE Weight Bearing: Non weight bearing      Mobility  Bed Mobility Overal bed mobility: Needs Assistance Bed Mobility: Supine to Sit     Supine to sit: Min assist     General bed mobility comments: MIN A to get trunk upright.  Pt moving slowly, but allowed pt to move L residual limb at own pace for best pain management.  Transfers Overall transfer level: Needs assistance Equipment used: Rolling walker (2 wheeled) Transfers: Sit to/from [facility] Sit to Stand: Min assist;+2 physical assistance Stand pivot transfers: Min assist;+2 physical assistance       General transfer comment: Cues to get R knee bent prior to stand and for hand placement.  During SPT, cues to get fully turned before reaching for chair to sit.  Ambulation/Gait             General Gait Details:  SPT only with RW  Stairs            Wheelchair Mobility    Modified Rankin (Stroke Patients Only)       Balance Overall balance assessment: Needs assistance   Sitting balance-Leahy Scale: Fair       Standing balance-Leahy Scale: Poor                               Pertinent Vitals/Pain Pain Assessment: 0-10 Pain Score: 5  Pain Location: L residual limb Pain Descriptors / Indicators: Grimacing;Guarding;Operative site guarding Pain Intervention(s): Limited activity within patient's tolerance;Monitored during session;Repositioned;Premedicated before session    Home Living Family/patient expects to be discharged to:: Private residence Living Arrangements: Parent;Other relatives Available Help at Discharge: Family;Available PRN/intermittently Type of Home: House Home Access: Stairs to enter Entrance Stairs-Rails: None Entrance Stairs-Number of Steps: 6 Home Layout: Two level;Bed/bath upstairs Home Equipment: Walker - 2 wheels;Cane - single point      Prior Function           Comments: reports limited mobility in the last month due to CA treatments.      Hand Dominance        Extremity/Trunk Assessment   Upper Extremity Assessment: Generalized weakness           Lower Extremity Assessment: Generalized weakness;LLE deficits/detail   LLE Deficits / Details: new L BKA with decreased knee extension     Communication   Communication: No  difficulties  Cognition Arousal/Alertness: Awake/alert Behavior During Therapy: WFL for tasks assessed/performed;Flat affect Overall Cognitive Status: Within Functional Limits for tasks assessed                      General Comments General comments (skin integrity, edema, etc.): wound vac present.  Limited movement of residual limb due to pain.  Educated on phantom pain.    Exercises     Assessment/Plan    PT Assessment Patient needs continued PT services  PT Problem List Decreased  strength;Decreased range of motion;Decreased activity tolerance;Decreased balance;Decreased mobility;Pain          PT Treatment Interventions DME instruction;Gait training;Functional mobility training;Therapeutic activities;Therapeutic exercise;Balance training;Patient/family education    PT Goals (Current goals can be found in the Care Plan section)  Acute Rehab PT Goals Patient Stated Goal: none stated, but agreeable to OOB PT Goal Formulation: With patient Time For Goal Achievement: 03/09/16 Potential to Achieve Goals: Good    Frequency Min 3X/week   Barriers to discharge Inaccessible home environment;Decreased caregiver support stairs to enter 2 level home with bedroom on 2nd floor and family not available 24 hours/day    Co-evaluation               End of Session Equipment Utilized During Treatment: Gait belt Activity Tolerance: Patient limited by pain Patient left: in chair;with call bell/phone within reach;with chair alarm set Nurse Communication: Mobility status         Time: 0950-1007 PT Time Calculation (min) (ACUTE ONLY): 17 min   Charges:   PT Evaluation $PT Eval Moderate Complexity: 1 Procedure     PT G Codes:        Donald Steele 02/24/2016, 10:47 AM

## 2016-02-24 NOTE — Progress Notes (Signed)
Inpatient Diabetes Program Recommendations  AACE/ADA: New Consensus Statement on Inpatient Glycemic Control (2015)  Target Ranges:  Prepandial:   less than 140 mg/dL      Peak postprandial:   less than 180 mg/dL (1-2 hours)      Critically ill patients:  140 - 180 mg/dL   Lab Results  Component Value Date   GLUCAP 228 (H) 02/24/2016   HGBA1C 9.6 02/15/2016    Review of Glycemic Control Hypo this am. May benefit from reduction in basal insulin.  Inpatient Diabetes Program Recommendations:     Decrease Lantus to 7 units QHS.  Will continue to follow. Thank you. Lorenda Peck, RD, LDN, CDE Inpatient Diabetes Coordinator [phone]

## 2016-02-24 NOTE — Care Management Note (Signed)
Case Management Note  Patient Details  Name: Donald Steele MRN: [id number] Date of Birth: 1960-12-25  Subjective/Objective:     55 yr old gentleman s/p left BKA with application of prevena wound Vac. Patient has pancreatic Ca and received most recent treatment on 01/09/16. Patient states he has been bedridden for past month. Physical therapy to see patient.   Action/Plan: CM will continue to follow for discharge plannning.            Expected Discharge Date:   (unknown)               Expected Discharge Plan:     In-House Referral:     Discharge planning Services     Post Acute Care Choice:    Choice offered to:     DME Arranged:    DME Agency:     HH Arranged:    HH Agency:     Status of Service:   in process  If discussed at [employer] of Stay Meetings, dates discussed:    Additional Comments:  Ninfa Meeker, RN 02/24/2016, 10:20 AM

## 2016-02-25 LAB — CBC
HEMATOCRIT: 25.8 % — AB (ref 39.0–52.0)
HEMOGLOBIN: 8 g/dL — AB (ref 13.0–17.0)
MCH: 24.4 pg — ABNORMAL LOW (ref 26.0–34.0)
MCHC: 31 g/dL (ref 30.0–36.0)
MCV: 78.7 fL (ref 78.0–100.0)
Platelets: 639 10*3/uL — ABNORMAL HIGH (ref 150–400)
RBC: 3.28 MIL/uL — ABNORMAL LOW (ref 4.22–5.81)
RDW: 16.2 % — AB (ref 11.5–15.5)
WBC: 12.9 10*3/uL — AB (ref 4.0–10.5)

## 2016-02-25 LAB — BASIC METABOLIC PANEL
ANION GAP: 8 (ref 5–15)
BUN: 7 mg/dL (ref 6–20)
CALCIUM: 8.4 mg/dL — AB (ref 8.9–10.3)
CHLORIDE: 99 mmol/L — AB (ref 101–111)
CO2: 30 mmol/L (ref 22–32)
Creatinine, Ser: 1.05 mg/dL (ref 0.61–1.24)
GFR calc non Af Amer: >60 mL/min (ref >60)
Glucose, Bld: 84 mg/dL (ref 65–99)
Potassium: 3.5 mmol/L (ref 3.5–5.1)
SODIUM: 137 mmol/L (ref 135–145)

## 2016-02-25 LAB — GLUCOSE, CAPILLARY
GLUCOSE-CAPILLARY: 59 mg/dL — AB (ref 65–99)
GLUCOSE-CAPILLARY: 84 mg/dL (ref 65–99)
GLUCOSE-CAPILLARY: 105 mg/dL — AB (ref 65–99)
Glucose-Capillary: 262 mg/dL — ABNORMAL HIGH (ref 65–99)
Glucose-Capillary: 118 mg/dL — ABNORMAL HIGH (ref 65–99)

## 2016-02-25 MED ORDER — INSULIN DETEMIR 100 UNIT/ML ~~LOC~~ SOLN
7.0000 [IU] | Freq: Every day | SUBCUTANEOUS | Status: DC
  Administered 2016-02-25 – 2016-02-27 (×3): 7 [IU] via SUBCUTANEOUS
  Filled 2016-02-25 (×4): qty 0.07

## 2016-02-25 NOTE — Progress Notes (Signed)
Patient ID: Donald Steele, male   DOB: Dec 21, 1960, 55 y.o.   MRN: [id number]    PROGRESS NOTE    Donald Steele  [id number] DOB: 09-24-1960 DOA: 02/21/2016  PCP: Crisoforo Oxford, PA-C   Brief Narrative:  55 y.o. male with medical history significant of uncontrolled diabetes mellitus with secondary peripheral vascular disease with chronic left lower extremity foot ulcer that has been managed by wound care. Patient states a few days ago, he dropped a plate of hot spaghetti on the wound.   Assessment & Plan: Sepsis due to Osteomyelitis of left foot and ankle - Evaluated by orthopedics Dr. Sharol Given. MRI of foot reviewed.  - Postoperative day 2, s/p left transtibial amputation.  - pt reports less pain - plan for PT eval to determine appropriate discharge plan  - continue ABX, WBC is trending down - CBC in AM  Type 2 diabetes mellitus with diabetic peripheral angiopathy and gangrene - with long-term current use of insulin: Continue current insulin regimen - changed lantus to 7 U (was on 10U) - Monitor blood sugar level. Diabetic diet.    Chronic combined systolic and diastolic CHF (congestive heart failure) ([place]) - Patient is currently euvolemic. He does not have chest pain. Stable.  Cancer of head of pancreas ([place]) - Advised outpatient follow-up with oncologist.  Hypokalemia - supplement, BMP in AM  Anemia of chronic disease - drop in Hg post op - no indicated for transfusion at this time as Hg ~ 8 - repeat CBC in AM  DVT prophylaxis: Was on Lovenox but this was held pre op.  Code Status: Full  Family Communication: Patient at bedside  Disposition Plan: To be determined, SNF vs home vs Inpatient rehab   Consultants:   Ortho   Procedures:   Left transtibial amputation 11/09 -->  Antimicrobials:   Vancomycin 11/07  Zosyn 11/07  Subjective: No events overnight.  Objective: Vitals:   02/24/16 0621 02/24/16 1248 02/24/16 2109 02/25/16 0622  BP: 124/69  (!) 89/51 128/67 135/65  Pulse: 85 92 87 87  Resp: 16 16    Temp: 97.8 F (36.6 C) 97.8 F (36.6 C) 98.1 F (36.7 C) 98 F (36.7 C)  TempSrc: Oral Oral Oral Oral  SpO2: 100% 96% 100% 100%  Weight:      Height:        Intake/Output Summary (Last 24 hours) at 02/25/16 1133 Last data filed at 02/25/16 0900  Gross per 24 hour  Intake              490 ml  Output              400 ml  Net               90 ml   Filed Weights   02/21/16 1109 02/21/16 1720  Weight: 81.2 kg (179 lb) 78.9 kg (174 lb)    Examination:  General exam: Appears calm and comfortable  Respiratory system: Clear to auscultation. Respiratory effort normal. Cardiovascular system: S1 & S2 heard, RRR. No JVD, murmurs, rubs, gallops or clicks. No pedal edema. Gastrointestinal system: Abdomen is nondistended, soft and nontender. No organomegaly or masses felt.   Data Reviewed: I have personally reviewed following labs and imaging studies  CBC:  Recent Labs Lab 02/21/16 1226 02/21/16 1947 02/22/16 0520 02/23/16 0500 02/24/16 0500 02/25/16 0427  WBC 23.3* 27.9* 26.8* 26.8* 17.3* 12.9*  NEUTROABS 18.4* 22.1*  --   --   --   --  HGB 8.8* 9.5* 8.7* 8.4* 7.9* 8.0*  HCT 27.5* 29.1* 26.9* 26.7* 25.4* 25.8*  MCV 78.3 78.4 78.2 78.3 78.9 78.7  PLT 581* 612* 628* 625* 615* [id number]*   Basic Metabolic Panel:  Recent Labs Lab 02/21/16 1226 02/22/16 0520 02/23/16 0500 02/24/16 0500 02/25/16 0427  NA 130* 134* 134* 137 137  K 3.3* 3.2* 3.4* 3.8 3.5  CL 94* 95* 98* 101 99*  CO2 28 30 30 27 30   GLUCOSE 266* 219* 117* 70 84  BUN 7 6 5* 7 7  CREATININE 0.74 0.67 0.84 0.90 1.05  CALCIUM 8.4* 8.4* 8.2* 8.4* 8.4*   Liver Function Tests:  Recent Labs Lab 02/21/16 1226  AST 13*  ALT 10*  ALKPHOS 133*  BILITOT 0.8  PROT 7.0  ALBUMIN 2.5*    Recent Labs Lab 02/21/16 1226  LIPASE 11   CBG:  Recent Labs Lab 02/24/16 1620 02/24/16 2113 02/25/16 0616 02/25/16 0725 02/25/16 1125  GLUCAP 218* 150*  59* 84 105*   Urine analysis:    Component Value Date/Time   COLORURINE YELLOW 12/27/2015 [place] 12/27/2015 1435   LABSPEC 1.015 12/27/2015 1435   PHURINE 5.5 12/27/2015 1435   GLUCOSEU NEGATIVE 12/27/2015 1435   HGBUR NEGATIVE 12/27/2015 1435   BILIRUBINUR NEGATIVE 12/27/2015 1435   BILIRUBINUR n 11/15/2015 1249   KETONESUR NEGATIVE 12/27/2015 1435   PROTEINUR NEGATIVE 12/27/2015 1435   UROBILINOGEN 0.2 11/15/2015 1249   NITRITE NEGATIVE 12/27/2015 1435   LEUKOCYTESUR NEGATIVE 12/27/2015 1435   Recent Results (from the past 240 hour(s))  Culture, blood (routine x 2)     Status: None (Preliminary result)   Collection Time: 02/21/16 12:30 PM  Result Value Ref Range Status   Specimen Description BLOOD PORTA CATH  Final   Special Requests BOTTLES DRAWN AEROBIC AND ANAEROBIC 5ML  Final   Culture   Final    NO GROWTH 4 DAYS Performed at [facility]    Report Status PENDING  Incomplete  Culture, blood (routine x 2)     Status: None (Preliminary result)   Collection Time: 02/21/16  2:04 PM  Result Value Ref Range Status   Specimen Description BLOOD RIGHT HAND  Final   Special Requests IN PEDIATRIC BOTTLE 1CC  Final   Culture   Final    NO GROWTH 4 DAYS Performed at [facility]    Report Status PENDING  Incomplete  Surgical PCR screen     Status: None   Collection Time: 02/22/16  4:30 AM  Result Value Ref Range Status   MRSA, PCR NEGATIVE NEGATIVE Final   Staphylococcus aureus NEGATIVE NEGATIVE Final    Comment:        The Xpert SA Assay (FDA approved for NASAL specimens in patients over 19 years of age), is one component of a comprehensive surveillance program.  Test performance has been validated by [facility] for patients greater than or equal to 48 year old. It is not intended to diagnose infection nor to guide or monitor treatment.       Radiology Studies: No results found.    Scheduled Meds: . atorvastatin  40 mg  Oral Daily  . docusate sodium  100 mg Oral BID  . feeding supplement (ENSURE ENLIVE)  237 mL Oral BID BM  . feeding supplement (PRO-STAT SUGAR FREE 64)  30 mL Oral BID  . insulin aspart  0-5 Units Subcutaneous QHS  . insulin aspart  0-9 Units Subcutaneous TID WC  . insulin  detemir  7 Units Subcutaneous QHS  . piperacillin-tazobactam (ZOSYN)  IV  3.375 g Intravenous Q8H  . senna  1 tablet Oral BID  . vancomycin  1,000 mg Intravenous Q8H   Continuous Infusions: . sodium chloride    . lactated ringers 10 mL/hr at 02/23/16 1533     LOS: 4 days   Time spent: 20 minutes   Faye Ramsay, MD [facility] Pager [phone]  If 7PM-7AM, please contact night-coverage [email or web address] Password [facility] 02/25/2016, 11:33 AM

## 2016-02-25 NOTE — Progress Notes (Signed)
Critical lab value morning glucose is 59.  Patient given 4 oz of orange juice.  He is alert and oriented x 4 and asymptomatic.  Will re-check glucose in 15 mins.

## 2016-02-25 NOTE — Progress Notes (Signed)
At 0725 glucose came up to 84.

## 2016-02-25 NOTE — Progress Notes (Signed)
   Subjective: 2 Days Post-Op Procedure(s) (LRB): LEFT BELOW KNEE AMPUTATION (Left) Patient reports pain as moderate.    Objective: Vital signs in last 24 hours: Temp:  [97.8 F (36.6 C)-98.1 F (36.7 C)] 98 F (36.7 C) (11/11 0622) Pulse Rate:  [87-92] 87 (11/11 0622) Resp:  [16] 16 (11/10 1248) BP: (89-135)/(51-67) 135/65 (11/11 0622) SpO2:  [96 %-100 %] 100 % (11/11 0622)  Intake/Output from previous day: 11/10 0701 - 11/11 0700 In: 250 [P.O.:240; I.V.:10] Out: 400 [Urine:400] Intake/Output this shift: Total I/O In: 240 [P.O.:240] Out: -    Recent Labs  02/23/16 0500 02/24/16 0500 02/25/16 0427  HGB 8.4* 7.9* 8.0*    Recent Labs  02/24/16 0500 02/25/16 0427  WBC 17.3* 12.9*  RBC 3.22* 3.28*  HCT 25.4* 25.8*  PLT 615* 639*    Recent Labs  02/24/16 0500 02/25/16 0427  NA 137 137  K 3.8 3.5  CL 101 99*  CO2 27 30  BUN 7 7  CREATININE 0.90 1.05  GLUCOSE 70 84  CALCIUM 8.4* 8.4*   No results for input(s): LABPT, INR in the last 72 hours.  PE:   VAC seal good.   Assessment/Plan: 2 Days Post-Op Procedure(s) (LRB): LEFT BELOW KNEE AMPUTATION (Left) Plan:   SNF vs Home with his Mom and sister.   Marybelle Killings 02/25/2016, 9:40 AM

## 2016-02-26 ENCOUNTER — Encounter (HOSPITAL_COMMUNITY): Payer: Self-pay | Admitting: *Deleted

## 2016-02-26 LAB — BASIC METABOLIC PANEL
Anion gap: 11 (ref 5–15)
BUN: 8 mg/dL (ref 6–20)
CHLORIDE: 98 mmol/L — AB (ref 101–111)
CO2: 30 mmol/L (ref 22–32)
CREATININE: 1.52 mg/dL — AB (ref 0.61–1.24)
Calcium: 8.6 mg/dL — ABNORMAL LOW (ref 8.9–10.3)
GFR calc non Af Amer: 50 mL/min — ABNORMAL LOW (ref >60)
GFR, EST AFRICAN AMERICAN: 57 mL/min — AB (ref >60)
Glucose, Bld: 74 mg/dL (ref 65–99)
Potassium: 3.8 mmol/L (ref 3.5–5.1)
Sodium: 139 mmol/L (ref 135–145)

## 2016-02-26 LAB — CBC
HEMATOCRIT: 25.9 % — AB (ref 39.0–52.0)
HEMOGLOBIN: 8 g/dL — AB (ref 13.0–17.0)
MCH: 24.2 pg — AB (ref 26.0–34.0)
MCHC: 30.9 g/dL (ref 30.0–36.0)
MCV: 78.5 fL (ref 78.0–100.0)
Platelets: 670 10*3/uL — ABNORMAL HIGH (ref 150–400)
RBC: 3.3 MIL/uL — ABNORMAL LOW (ref 4.22–5.81)
RDW: 16.4 % — AB (ref 11.5–15.5)
WBC: 13 10*3/uL — ABNORMAL HIGH (ref 4.0–10.5)

## 2016-02-26 LAB — CULTURE, BLOOD (ROUTINE X 2)
CULTURE: NO GROWTH
Culture: NO GROWTH

## 2016-02-26 LAB — GLUCOSE, CAPILLARY
GLUCOSE-CAPILLARY: 75 mg/dL (ref 65–99)
GLUCOSE-CAPILLARY: 110 mg/dL — AB (ref 65–99)
Glucose-Capillary: 79 mg/dL (ref 65–99)
Glucose-Capillary: 78 mg/dL (ref 65–99)

## 2016-02-26 MED ORDER — BISACODYL 10 MG RE SUPP: 10.0000 mg | Freq: Every day | RECTAL | Status: DC | PRN

## 2016-02-26 NOTE — Progress Notes (Signed)
Patient ID: Donald Steele, male   DOB: 1960/07/13, 55 y.o.   MRN: [id number]    PROGRESS NOTE    Donald Steele  [id number] DOB: 1960-08-04 DOA: 02/21/2016  PCP: Crisoforo Oxford, PA-C   Brief Narrative:  55 y.o. male with medical history significant of uncontrolled diabetes mellitus with secondary peripheral vascular disease with chronic left lower extremity foot ulcer that has been managed by wound care. Patient states a few days ago, he dropped a plate of hot spaghetti on the wound.   Assessment & Plan: Sepsis due to Osteomyelitis of left foot and ankle - Evaluated by orthopedics Dr. Sharol Given. MRI of foot reviewed.  - Postoperative day 3, s/p left transtibial amputation.  - pt reports less pain - continue ABX, WBC is trending down - CBC in AM - plan to d/c SNF in AM  Type 2 diabetes mellitus with diabetic peripheral angiopathy and gangrene - with long-term current use of insulin: Continue current insulin regimen - changed lantus to 7 U (was on 10U) - Monitor blood sugar level. Diabetic diet.    Chronic combined systolic and diastolic CHF (congestive heart failure) ([place]) - Patient is currently euvolemic. He does not have chest pain. Stable.  Cancer of head of pancreas ([place]) - Advised outpatient follow-up with oncologist.  Hypokalemia - supplement, BMP in AM  Anemia of chronic disease - drop in Hg post op - no indicated for transfusion at this time as Hg ~ 8 - repeat CBC in AM  Acute kidney injury  - sudden increase in Cr, unclear etiology - pt not on any nephrotoxic medications - will ask for bladder scan to rule out obstruction - BMP in AM  DVT prophylaxis: Was on Lovenox but this was held pre op.  Code Status: Full  Family Communication: Patient at bedside  Disposition Plan: SNF in AM  Consultants:   Ortho   Procedures:   Left transtibial amputation 11/09 -->  Antimicrobials:   Vancomycin 11/07  Zosyn 11/07  Subjective: No events  overnight.  Objective: Vitals:   02/25/16 1948 02/26/16 0606 02/26/16 0608 02/26/16 1410  BP: (!) 113/57 (!) 96/59 (!) 96/52 123/63  Pulse: 92 93  90  Resp:    17  Temp: 99.1 F (37.3 C) 98.9 F (37.2 C)  98.8 F (37.1 C)  TempSrc: Oral Oral  Oral  SpO2: 100% 100%  96%  Weight:      Height:        Intake/Output Summary (Last 24 hours) at 02/26/16 1527 Last data filed at 02/26/16 0707  Gross per 24 hour  Intake              305 ml  Output              175 ml  Net              130 ml   Filed Weights   02/21/16 1109 02/21/16 1720  Weight: 81.2 kg (179 lb) 78.9 kg (174 lb)    Examination:  General exam: Appears calm and comfortable  Respiratory system: Clear to auscultation. Respiratory effort normal. Cardiovascular system: S1 & S2 heard, RRR. No JVD, murmurs, rubs, gallops or clicks. No pedal edema. Gastrointestinal system: Abdomen is nondistended, soft and nontender. No organomegaly or masses felt.   Data Reviewed: I have personally reviewed following labs and imaging studies  CBC:  Recent Labs Lab 02/21/16 1226 02/21/16 1947 02/22/16 0520 02/23/16 0500 02/24/16 0500 02/25/16 0427 02/26/16 0500  WBC  23.3* 27.9* 26.8* 26.8* 17.3* 12.9* 13.0*  NEUTROABS 18.4* 22.1*  --   --   --   --   --   HGB 8.8* 9.5* 8.7* 8.4* 7.9* 8.0* 8.0*  HCT 27.5* 29.1* 26.9* 26.7* 25.4* 25.8* 25.9*  MCV 78.3 78.4 78.2 78.3 78.9 78.7 78.5  PLT 581* 612* 628* 625* 615* 639* [id number]*   Basic Metabolic Panel:  Recent Labs Lab 02/22/16 0520 02/23/16 0500 02/24/16 0500 02/25/16 0427 02/26/16 0500  NA 134* 134* 137 137 139  K 3.2* 3.4* 3.8 3.5 3.8  CL 95* 98* 101 99* 98*  CO2 30 30 27 30 30   GLUCOSE 219* 117* 70 84 74  BUN 6 5* 7 7 8   CREATININE 0.67 0.84 0.90 1.05 1.52*  CALCIUM 8.4* 8.2* 8.4* 8.4* 8.6*   Liver Function Tests:  Recent Labs Lab 02/21/16 1226  AST 13*  ALT 10*  ALKPHOS 133*  BILITOT 0.8  PROT 7.0  ALBUMIN 2.5*    Recent Labs Lab 02/21/16 1226    LIPASE 11   CBG:  Recent Labs Lab 02/25/16 1125 02/25/16 1656 02/25/16 2032 02/26/16 0612 02/26/16 1149  GLUCAP 105* 262* 118* 79 78   Urine analysis:    Component Value Date/Time   COLORURINE YELLOW 12/27/2015 1435   APPEARANCEUR CLEAR 12/27/2015 1435   LABSPEC 1.015 12/27/2015 1435   PHURINE 5.5 12/27/2015 1435   GLUCOSEU NEGATIVE 12/27/2015 1435   HGBUR NEGATIVE 12/27/2015 1435   BILIRUBINUR NEGATIVE 12/27/2015 1435   BILIRUBINUR n 11/15/2015 1249   KETONESUR NEGATIVE 12/27/2015 1435   PROTEINUR NEGATIVE 12/27/2015 1435   UROBILINOGEN 0.2 11/15/2015 1249   NITRITE NEGATIVE 12/27/2015 1435   LEUKOCYTESUR NEGATIVE 12/27/2015 1435   Recent Results (from the past 240 hour(s))  Culture, blood (routine x 2)     Status: None   Collection Time: 02/21/16 12:30 PM  Result Value Ref Range Status   Specimen Description BLOOD PORTA CATH  Final   Special Requests BOTTLES DRAWN AEROBIC AND ANAEROBIC 5ML  Final   Culture   Final    NO GROWTH 5 DAYS Performed at [facility]    Report Status 02/26/2016 FINAL  Final  Culture, blood (routine x 2)     Status: None   Collection Time: 02/21/16  2:04 PM  Result Value Ref Range Status   Specimen Description BLOOD RIGHT HAND  Final   Special Requests IN PEDIATRIC BOTTLE [place]  Final   Culture   Final    NO GROWTH 5 DAYS Performed at [facility]    Report Status 02/26/2016 FINAL  Final  Surgical PCR screen     Status: None   Collection Time: 02/22/16  4:30 AM  Result Value Ref Range Status   MRSA, PCR NEGATIVE NEGATIVE Final   Staphylococcus aureus NEGATIVE NEGATIVE Final    Comment:        The Xpert SA Assay (FDA approved for NASAL specimens in patients over 29 years of age), is one component of a comprehensive surveillance program.  Test performance has been validated by [facility] for patients greater than or equal to 33 year old. It is not intended to diagnose infection nor to guide or monitor  treatment.       Radiology Studies: No results found.    Scheduled Meds: . atorvastatin  40 mg Oral Daily  . docusate sodium  100 mg Oral BID  . feeding supplement (ENSURE ENLIVE)  237 mL Oral BID BM  . feeding supplement (PRO-STAT  SUGAR FREE 64)  30 mL Oral BID  . insulin aspart  0-5 Units Subcutaneous QHS  . insulin aspart  0-9 Units Subcutaneous TID WC  . insulin detemir  7 Units Subcutaneous QHS  . senna  1 tablet Oral BID   Continuous Infusions: . sodium chloride    . lactated ringers 10 mL/hr at 02/23/16 1533     LOS: 5 days   Time spent: 20 minutes   Faye Ramsay, MD [facility] Pager [phone]  If 7PM-7AM, please contact night-coverage [email or web address] Password [email or web address] 02/26/2016, 3:27 PM

## 2016-02-26 NOTE — Progress Notes (Signed)
   Subjective: 3 Days Post-Op Procedure(s) (LRB): LEFT BELOW KNEE AMPUTATION (Left) Patient reports pain as moderate.    Objective: Vital signs in last 24 hours: Temp:  [98.6 F (37 C)-99.1 F (37.3 C)] 98.9 F (37.2 C) (11/12 0606) Pulse Rate:  [92-95] 93 (11/12 0606) Resp:  [19] 19 (11/11 1406) BP: (96-136)/(52-73) 96/52 (11/12 0608) SpO2:  [100 %] 100 % (11/12 0606)  Intake/Output from previous day: 11/11 0701 - 11/12 0700 In: 815 [P.O.:480; I.V.:85; IV Piggyback:250] Out: 175 [Urine:175] Intake/Output this shift: Total I/O In: 100 [P.O.:100] Out: -    Recent Labs  02/24/16 0500 02/25/16 0427 02/26/16 0500  HGB 7.9* 8.0* 8.0*    Recent Labs  02/25/16 0427 02/26/16 0500  WBC 12.9* 13.0*  RBC 3.28* 3.30*  HCT 25.8* 25.9*  PLT 639* 670*    Recent Labs  02/25/16 0427 02/26/16 0500  NA 137 139  K 3.5 3.8  CL 99* 98*  CO2 30 30  BUN 7 8  CREATININE 1.05 1.52*  GLUCOSE 84 74  CALCIUM 8.4* 8.6*   No results for input(s): LABPT, INR in the last 72 hours.  Vac in place  Assessment/Plan: 3 Days Post-Op Procedure(s) (LRB): LEFT BELOW KNEE AMPUTATION (Left) Up with therapy Discharge to SNF  Marybelle Killings 02/26/2016, 10:50 AM

## 2016-02-27 ENCOUNTER — Inpatient Hospital Stay (HOSPITAL_COMMUNITY): Payer: BLUE CROSS/BLUE SHIELD

## 2016-02-27 LAB — BASIC METABOLIC PANEL
Anion gap: 11 (ref 5–15)
BUN: 10 mg/dL (ref 6–20)
CO2: 28 mmol/L (ref 22–32)
CREATININE: 1.72 mg/dL — AB (ref 0.61–1.24)
Calcium: 8.4 mg/dL — ABNORMAL LOW (ref 8.9–10.3)
Chloride: 98 mmol/L — ABNORMAL LOW (ref 101–111)
GFR, EST AFRICAN AMERICAN: 49 mL/min — AB (ref >60)
GFR, EST NON AFRICAN AMERICAN: 43 mL/min — AB (ref >60)
Glucose, Bld: 100 mg/dL — ABNORMAL HIGH (ref 65–99)
POTASSIUM: 3.6 mmol/L (ref 3.5–5.1)
SODIUM: 137 mmol/L (ref 135–145)

## 2016-02-27 LAB — CBC
HCT: 25.7 % — ABNORMAL LOW (ref 39.0–52.0)
Hemoglobin: 8 g/dL — ABNORMAL LOW (ref 13.0–17.0)
MCH: 24.5 pg — AB (ref 26.0–34.0)
MCHC: 31.1 g/dL (ref 30.0–36.0)
MCV: 78.8 fL (ref 78.0–100.0)
PLATELETS: 666 10*3/uL — AB (ref 150–400)
RBC: 3.26 MIL/uL — AB (ref 4.22–5.81)
RDW: 16.4 % — ABNORMAL HIGH (ref 11.5–15.5)
WBC: 11.4 10*3/uL — ABNORMAL HIGH (ref 4.0–10.5)

## 2016-02-27 LAB — GLUCOSE, CAPILLARY
GLUCOSE-CAPILLARY: 115 mg/dL — AB (ref 65–99)
GLUCOSE-CAPILLARY: 136 mg/dL — AB (ref 65–99)
GLUCOSE-CAPILLARY: 105 mg/dL — AB (ref 65–99)
Glucose-Capillary: 95 mg/dL (ref 65–99)

## 2016-02-27 NOTE — NC FL2 (Signed)
[place] MEDICAID FL2 LEVEL OF CARE SCREENING TOOL     IDENTIFICATION  Patient Name: Donald Steele Birthdate: [birth year] Sex: male Admission Date (Current Location): 02/21/2016  [facility] and [place] Number:  Herbalist and Address:  [facility], [place] [address] [place], [place] [address]      Provider Number: [id number]  Attending Physician Name and Address:  Theodis Blaze, MD  Relative Name and Phone Number:       Current Level of Care: Hospital Recommended Level of Care: [place] Prior Approval Number:    Date Approved/Denied:   PASRR Number: [id number]  Discharge Plan: SNF    Current Diagnoses: Patient Active Problem List   Diagnosis Date Noted  . Protein-calorie malnutrition, severe 02/23/2016  . Osteomyelitis of ankle and foot ([place]) 02/21/2016  . Diabetic foot ulcer ([place]) 02/21/2016  . Port catheter in place 01/09/2016  . Cancer of head of pancreas ([place]) 12/16/2015  . Adenocarcinoma ([place]) 12/14/2015  . Liver enzyme elevation 12/14/2015  . Pancreatic insufficiency 12/09/2015  . 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 Obstructive jaundice due to malignant neoplasm ([place]) 12/01/2015  . Pancreatic mass 12/01/2015  . Obstructive jaundice 12/01/2015  . Former smoker 11/15/2015  . Vaccine counseling 11/15/2015  . Tooth decay 11/15/2015  . Ischemic cardiomyopathy 07/14/2015  . CAD (coronary artery disease) 07/14/2015  . Hypertensive heart disease 07/14/2015  . HLD (hyperlipidemia) 07/14/2015  . Subclavian artery stenosis, left ([place]) 07/07/2015  . Abnormal stress test   . Ischemic finger 06/28/2015  . Chronic combined systolic and diastolic CHF (congestive heart failure) ([place])   . 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 Preop cardiovascular exam 06/01/2015  . Carotid bruit 06/01/2015  . Nonhealing ulcer of right lower extremity ([place])   . Type 2  diabetes mellitus with diabetic peripheral angiopathy and gangrene, with long-term current use of insulin ([place]) 05/31/2015  . Femoral bruit 05/31/2015  . Skin breakdown 05/31/2015  . Leg ulcer, left ([place]) 05/31/2015  . Type 2 diabetes mellitus with diabetic neuropathy, without long-term current use of insulin ([place]) 05/31/2015  . Right foot pain 05/31/2015  . PAD (peripheral artery disease) ([place]) 05/31/2015    Orientation RESPIRATION BLADDER Height & Weight     Self, Time, Situation, Place  Normal Continent Weight: 174 lb (78.9 kg) Height:  5\' 11"  (180.3 cm)  BEHAVIORAL SYMPTOMS/MOOD NEUROLOGICAL BOWEL NUTRITION STATUS      Continent Diet  AMBULATORY STATUS COMMUNICATION OF NEEDS Skin   Limited Assist Verbally Surgical wounds, Wound Vac (left BKA)                       Personal Care Assistance Level of Assistance  Bathing, Dressing Bathing Assistance: Limited assistance   Dressing Assistance: Limited assistance     Functional Limitations Info             SPECIAL CARE FACTORS FREQUENCY  PT (By licensed PT), OT (By licensed OT)     PT Frequency: daily OT Frequency: daily            Contractures Contractures Info: Not present    Additional Factors Info                  Current Medications (02/27/2016):  This is the current hospital active medication list Current Facility-Administered Medications  Medication Dose Route Frequency Provider Last Rate Last Dose  . 0.9 %  sodium chloride  infusion   Intravenous Continuous Annita Brod, MD      . acetaminophen (TYLENOL) tablet 650 mg  650 mg Oral Q6H PRN Annita Brod, MD       Or  . acetaminophen (TYLENOL) suppository 650 mg  650 mg Rectal Q6H PRN Annita Brod, MD      . atorvastatin (LIPITOR) tablet 40 mg  40 mg Oral Daily Annita Brod, MD   40 mg at 02/27/16 0934  . bisacodyl (DULCOLAX) suppository 10 mg  10 mg Rectal Daily PRN Marybelle Killings, MD      . docusate sodium (COLACE) capsule 100  mg  100 mg Oral BID Annita Brod, MD   100 mg at 02/27/16 0934  . feeding supplement (ENSURE ENLIVE) (ENSURE ENLIVE) liquid 237 mL  237 mL Oral BID BM Theodis Blaze, MD   237 mL at 02/25/16 1107  . feeding supplement (PRO-STAT SUGAR FREE 64) liquid 30 mL  30 mL Oral BID Theodis Blaze, MD   30 mL at 02/26/16 2308  . HYDROmorphone (DILAUDID) injection 1 mg  1 mg Intravenous Q4H PRN Dron Tanna Furry, MD   1 mg at 02/24/16 1130  . insulin aspart (novoLOG) injection 0-5 Units  0-5 Units Subcutaneous QHS Annita Brod, MD      . insulin aspart (novoLOG) injection 0-9 Units  0-9 Units Subcutaneous TID WC Annita Brod, MD   5 Units at 02/25/16 1703  . insulin detemir (LEVEMIR) injection 7 Units  7 Units Subcutaneous QHS Theodis Blaze, MD   7 Units at 02/26/16 2309  . lactated ringers infusion   Intravenous Continuous Effie Berkshire, MD 10 mL/hr at 02/23/16 1533    . LORazepam (ATIVAN) injection 1 mg  1 mg Intravenous Q4H PRN Carmin Muskrat, MD   1 mg at 02/21/16 2013  . methocarbamol (ROBAXIN) tablet 500 mg  500 mg Oral Q6H PRN Newt Minion, MD   500 mg at 02/27/16 0315   Or  . methocarbamol (ROBAXIN) 500 mg in dextrose 5 % 50 mL IVPB  500 mg Intravenous Q6H PRN Newt Minion, MD      . metoCLOPramide (REGLAN) tablet 5-10 mg  5-10 mg Oral Q8H PRN Newt Minion, MD       Or  . metoCLOPramide (REGLAN) injection 5-10 mg  5-10 mg Intravenous Q8H PRN Newt Minion, MD      . ondansetron [facility]) tablet 4 mg  4 mg Oral Q6H PRN Annita Brod, MD       Or  . ondansetron (ZOFRAN) injection 4 mg  4 mg Intravenous Q6H PRN Annita Brod, MD   4 mg at 02/27/16 0939  . oxyCODONE (Oxy IR/ROXICODONE) immediate release tablet 5 mg  5 mg Oral Q4H PRN Annita Brod, MD   5 mg at 02/27/16 0738  . polyethylene glycol (MIRALAX / GLYCOLAX) packet 17 g  17 g Oral Daily PRN Annita Brod, MD      . senna (SENOKOT) tablet 8.6 mg  1 tablet Oral BID Annita Brod, MD   8.6 mg at 02/27/16 0934   . sodium chloride flush (NS) 0.9 % injection 10-40 mL  10-40 mL Intracatheter PRN Annita Brod, MD   10 mL at 02/25/16 0427   Facility-Administered Medications Ordered in Other Encounters  Medication Dose Route Frequency Provider Last Rate Last Dose  . sodium chloride flush (NS) 0.9 % injection 10 mL  10 mL  Intravenous PRN Ladell Pier, MD   10 mL at 01/02/16 1550  . sodium chloride flush (NS) 0.9 % injection 10 mL  10 mL Intracatheter PRN Ladell Pier, MD   10 mL at 01/09/16 1559     Discharge Medications: Please see discharge summary for a list of discharge medications.  Relevant Imaging Results:  Relevant Lab Results:   Additional Information SSN: [id number]  Dulcy Fanny, LCSW

## 2016-02-27 NOTE — Progress Notes (Signed)
Physical Therapy Treatment Patient Details Name: Donald Steele MRN: [id number] DOB: [birth year] Today's Date: 02/27/2016    History of Present Illness 55 y/o male s/p L transtibial amputation (02/23/16) due to non healing wound.  Pt was recently diagnosed in August with pancreatic CA and had last treatment in September.  PMH includes PVD, HTN, CHF, DM    PT Comments    Patient limited by pain but willing to participate in session. Pt given HEP handout. Continue to progress as tolerated with anticipated d/c to SNF for further skilled PT services.    Follow Up Recommendations  SNF;Supervision for mobility/OOB     Equipment Recommendations  Wheelchair (measurements PT);Wheelchair cushion (measurements PT)    Recommendations for Other Services       Precautions / Restrictions Precautions Precautions: Fall Precaution Comments: L BKA, wound vac Restrictions Weight Bearing Restrictions: Yes LLE Weight Bearing: Non weight bearing    Mobility  Bed Mobility Overal bed mobility: Needs Assistance Bed Mobility: Supine to Sit     Supine to sit: Min guard     General bed mobility comments: cues for technique; min guard for safety and line management  Transfers Overall transfer level: Needs assistance Equipment used: Rolling walker (2 wheeled);None Transfers: Sit to/from [facility] Sit to Stand: Mod assist;From elevated surface Stand pivot transfers: Min assist       General transfer comment: cues for safe hand placement and technique; pt stood with RW and assist to power up into standing and steady upon stand; despite max cues for safe use of AD to pivot pt reached for recliner arm and pivoted with assist for balance and safe descent to chair; educated on safety when transferring   Ambulation/Gait                 Stairs            Wheelchair Mobility    Modified Rankin (Stroke Patients Only)       Balance                                     Cognition Arousal/Alertness: Awake/alert Behavior During Therapy: WFL for tasks assessed/performed;Flat affect Overall Cognitive Status: Within Functional Limits for tasks assessed                      Exercises Amputee Exercises Quad Sets: AROM;Left;10 reps;Seated Hip ABduction/ADduction: AROM;Left;10 reps;Seated Hip Flexion/Marching: AROM;Left;10 reps;Seated    General Comments General comments (skin integrity, edema, etc.): pt given HEP handout      Pertinent Vitals/Pain Pain Assessment: Faces Faces Pain Scale: Hurts even more Pain Location: L residual limb Pain Descriptors / Indicators: Aching;Grimacing;Guarding;Sore Pain Intervention(s): Limited activity within patient's tolerance;Monitored during session;Premedicated before session;Repositioned    Home Living                      Prior Function            PT Goals (current goals can now be found in the care plan section) Acute Rehab PT Goals Patient Stated Goal: none stated Progress towards PT goals: Progressing toward goals    Frequency    Min 3X/week      PT Plan Current plan remains appropriate    Co-evaluation             End of Session Equipment Utilized During Treatment: Gait belt Activity Tolerance: Patient  limited by pain Patient left: in chair;with call bell/phone within reach     Time: 1032-1117 PT Time Calculation (min) (ACUTE ONLY): 45 min  Charges:  $Therapeutic Exercise: 8-22 mins $Therapeutic Activity: 23-37 mins                    G Codes:      Salina April, PTA Pager: [phone]   02/27/2016, 11:31 AM

## 2016-02-27 NOTE — Progress Notes (Addendum)
Patient ID: Donald Steele, male   DOB: 03-Jan-1961, 55 y.o.   MRN: [id number]    PROGRESS NOTE    Donald Steele  [id number] DOB: 01-12-1961 DOA: 02/21/2016  PCP: Crisoforo Oxford, PA-C   Brief Narrative:  55 y.o. male with medical history significant of uncontrolled diabetes mellitus with secondary peripheral vascular disease with chronic left lower extremity foot ulcer that has been managed by wound care. Patient states a few days ago, he dropped a plate of hot spaghetti on the wound.   Assessment & Plan: Sepsis due to Osteomyelitis of left foot and ankle - Evaluated by orthopedics Dr. Sharol Given. MRI of foot reviewed.  - Postoperative day 4, s/p left transtibial amputation.  - pt reports less pain - WBC is trending down, completed ABX - CBC in AM - plan to d/c SNF in AM if renal function stable   Type 2 diabetes mellitus with diabetic peripheral angiopathy and gangrene - with long-term current use of insulin: Continue current insulin regimen - continue Lantus  - Monitor blood sugar level. Diabetic diet.    Chronic combined systolic and diastolic CHF (congestive heart failure) ([place]) - Patient is currently euvolemic. He does not have chest pain. Stable.  Cancer of head of pancreas ([place]) - Advised outpatient follow-up with oncologist.  Hypokalemia - supplement, BMP in AM  Anemia of chronic disease - drop in Hg post op - no indicated for transfusion at this time as Hg ~ 8 - repeat CBC in AM  Acute kidney injury  - sudden increase in Cr, unclear etiology - pt not on any nephrotoxic medications but was on vanc so this could have contributed  - renal US requested, vanc has been discontinued 11/12 - BMP in AM  DVT prophylaxis: Was on Lovenox but this was held pre op.  Code Status: Full  Family Communication: Patient at bedside  Disposition Plan: SNF in AM if renal function stable   Consultants:   Ortho   Procedures:   Left transtibial amputation 11/09  -->  Antimicrobials:   Vancomycin 11/07 --> 11/12  Zosyn 11/07 --> 11/12  Subjective: No events overnight.  Objective: Vitals:   02/26/16 0608 02/26/16 1410 02/26/16 2027 02/27/16 0604  BP: (!) 96/52 123/63 128/68 132/65  Pulse:  90 90 83  Resp:  17    Temp:  98.8 F (37.1 C) 98.2 F (36.8 C) 98.7 F (37.1 C)  TempSrc:  Oral Oral Oral  SpO2:  96% 100% 100%  Weight:      Height:        Intake/Output Summary (Last 24 hours) at 02/27/16 1033 Last data filed at 02/26/16 2300  Gross per 24 hour  Intake              462 ml  Output             1000 ml  Net             -538 ml   Filed Weights   02/21/16 1109 02/21/16 1720  Weight: 81.2 kg (179 lb) 78.9 kg (174 lb)    Examination:  General exam: Appears calm and comfortable  Respiratory system: Clear to auscultation. Respiratory effort normal. Cardiovascular system: S1 & S2 heard, RRR. No JVD, murmurs, rubs, gallops or clicks. No pedal edema. Gastrointestinal system: Abdomen is nondistended, soft and nontender. No organomegaly or masses felt.   Data Reviewed: I have personally reviewed following labs and imaging studies  CBC:  Recent Labs Lab 02/21/16 1226  02/21/16 1947  02/23/16 0500 02/24/16 0500 02/25/16 0427 02/26/16 0500 02/27/16 0524  WBC 23.3* 27.9*  < > 26.8* 17.3* 12.9* 13.0* 11.4*  NEUTROABS 18.4* 22.1*  --   --   --   --   --   --   HGB 8.8* 9.5*  < > 8.4* 7.9* 8.0* 8.0* 8.0*  HCT 27.5* 29.1*  < > 26.7* 25.4* 25.8* 25.9* 25.7*  MCV 78.3 78.4  < > 78.3 78.9 78.7 78.5 78.8  PLT 581* 612*  < > 625* 615* 639* 670* 666*  < > = values in this interval not displayed. Basic Metabolic Panel:  Recent Labs Lab 02/23/16 0500 02/24/16 0500 02/25/16 0427 02/26/16 0500 02/27/16 0524  NA 134* 137 137 139 137  K 3.4* 3.8 3.5 3.8 3.6  CL 98* 101 99* 98* 98*  CO2 30 27 30 30 28   GLUCOSE 117* 70 84 74 100*  BUN 5* 7 7 8 10   CREATININE 0.84 0.90 1.05 1.52* 1.72*  CALCIUM 8.2* 8.4* 8.4* 8.6* 8.4*    Liver Function Tests:  Recent Labs Lab 02/21/16 1226  AST 13*  ALT 10*  ALKPHOS 133*  BILITOT 0.8  PROT 7.0  ALBUMIN 2.5*    Recent Labs Lab 02/21/16 1226  LIPASE 11   CBG:  Recent Labs Lab 02/26/16 0612 02/26/16 1149 02/26/16 1647 02/26/16 2031 02/27/16 0609  GLUCAP 79 78 75 110* 115*   Urine analysis:    Component Value Date/Time   COLORURINE YELLOW 12/27/2015 [place] 12/27/2015 1435   LABSPEC 1.015 12/27/2015 1435   PHURINE 5.5 12/27/2015 1435   GLUCOSEU NEGATIVE 12/27/2015 1435   HGBUR NEGATIVE 12/27/2015 1435   BILIRUBINUR NEGATIVE 12/27/2015 1435   BILIRUBINUR n 11/15/2015 1249   KETONESUR NEGATIVE 12/27/2015 1435   PROTEINUR NEGATIVE 12/27/2015 1435   UROBILINOGEN 0.2 11/15/2015 1249   NITRITE NEGATIVE 12/27/2015 1435   LEUKOCYTESUR NEGATIVE 12/27/2015 1435   Recent Results (from the past 240 hour(s))  Culture, blood (routine x 2)     Status: None   Collection Time: 02/21/16 12:30 PM  Result Value Ref Range Status   Specimen Description BLOOD PORTA CATH  Final   Special Requests BOTTLES DRAWN AEROBIC AND ANAEROBIC 5ML  Final   Culture   Final    NO GROWTH 5 DAYS Performed at [facility]    Report Status 02/26/2016 FINAL  Final  Culture, blood (routine x 2)     Status: None   Collection Time: 02/21/16  2:04 PM  Result Value Ref Range Status   Specimen Description BLOOD RIGHT HAND  Final   Special Requests IN PEDIATRIC BOTTLE [place]  Final   Culture   Final    NO GROWTH 5 DAYS Performed at [facility]    Report Status 02/26/2016 FINAL  Final  Surgical PCR screen     Status: None   Collection Time: 02/22/16  4:30 AM  Result Value Ref Range Status   MRSA, PCR NEGATIVE NEGATIVE Final   Staphylococcus aureus NEGATIVE NEGATIVE Final    Comment:        The Xpert SA Assay (FDA approved for NASAL specimens in patients over 50 years of age), is one component of a comprehensive surveillance program.  Test  performance has been validated by [facility] for patients greater than or equal to 31 year old. It is not intended to diagnose infection nor to guide or monitor treatment.       Radiology Studies: No  results found.    Scheduled Meds: . atorvastatin  40 mg Oral Daily  . docusate sodium  100 mg Oral BID  . feeding supplement (ENSURE ENLIVE)  237 mL Oral BID BM  . feeding supplement (PRO-STAT SUGAR FREE 64)  30 mL Oral BID  . insulin aspart  0-5 Units Subcutaneous QHS  . insulin aspart  0-9 Units Subcutaneous TID WC  . insulin detemir  7 Units Subcutaneous QHS  . senna  1 tablet Oral BID   Continuous Infusions: . sodium chloride    . lactated ringers 10 mL/hr at 02/23/16 1533     LOS: 6 days   Time spent: 20 minutes   Faye Ramsay, MD [facility] Pager [phone]  If 7PM-7AM, please contact night-coverage [email or web address] Password [email or web address] 02/27/2016, 10:33 AM

## 2016-02-27 NOTE — Care Management (Signed)
Patient's daughter and patient informed social worker that He will go Home at discharge. He has enough family support to be care for adequately. Patient has already been setup with [place].

## 2016-02-28 DIAGNOSIS — C250 Malignant neoplasm of head of pancreas: Secondary | ICD-10-CM

## 2016-02-28 LAB — BASIC METABOLIC PANEL
ANION GAP: 9 (ref 5–15)
BUN: 9 mg/dL (ref 6–20)
CO2: 29 mmol/L (ref 22–32)
Calcium: 8.5 mg/dL — ABNORMAL LOW (ref 8.9–10.3)
Chloride: 99 mmol/L — ABNORMAL LOW (ref 101–111)
Creatinine, Ser: 1.83 mg/dL — ABNORMAL HIGH (ref 0.61–1.24)
GFR calc Af Amer: 46 mL/min — ABNORMAL LOW (ref >60)
GFR, EST NON AFRICAN AMERICAN: 40 mL/min — AB (ref >60)
GLUCOSE: 62 mg/dL — AB (ref 65–99)
POTASSIUM: 3.6 mmol/L (ref 3.5–5.1)
Sodium: 137 mmol/L (ref 135–145)

## 2016-02-28 LAB — CBC
HEMATOCRIT: 25.8 % — AB (ref 39.0–52.0)
HEMOGLOBIN: 8 g/dL — AB (ref 13.0–17.0)
MCH: 24.7 pg — ABNORMAL LOW (ref 26.0–34.0)
MCHC: 31 g/dL (ref 30.0–36.0)
MCV: 79.6 fL (ref 78.0–100.0)
Platelets: 615 10*3/uL — ABNORMAL HIGH (ref 150–400)
RBC: 3.24 MIL/uL — AB (ref 4.22–5.81)
RDW: 16.8 % — ABNORMAL HIGH (ref 11.5–15.5)
WBC: 11.8 10*3/uL — AB (ref 4.0–10.5)

## 2016-02-28 LAB — GLUCOSE, CAPILLARY
GLUCOSE-CAPILLARY: 189 mg/dL — AB (ref 65–99)
GLUCOSE-CAPILLARY: 165 mg/dL — AB (ref 65–99)
Glucose-Capillary: 67 mg/dL (ref 65–99)
Glucose-Capillary: 81 mg/dL (ref 65–99)

## 2016-02-28 LAB — SODIUM, URINE, RANDOM: SODIUM UR: 30 mmol/L

## 2016-02-28 LAB — CREATININE, URINE, RANDOM: CREATININE, URINE: 88.78 mg/dL

## 2016-02-28 MED ORDER — INSULIN DETEMIR 100 UNIT/ML ~~LOC~~ SOLN
5.0000 [IU] | Freq: Every day | SUBCUTANEOUS | Status: DC
  Administered 2016-02-28 – 2016-02-29 (×2): 5 [IU] via SUBCUTANEOUS
  Filled 2016-02-28 (×3): qty 0.05

## 2016-02-28 MED ORDER — SENNOSIDES-DOCUSATE SODIUM 8.6-50 MG PO TABS
1.0000 | ORAL_TABLET | Freq: Two times a day (BID) | ORAL | Status: DC
  Administered 2016-02-28 – 2016-03-01 (×4): 1 via ORAL
  Filled 2016-02-28 (×4): qty 1

## 2016-02-28 NOTE — Progress Notes (Signed)
Patient ID: Donald Steele, male   DOB: 12-19-1960, 55 y.o.   MRN: [id number]    PROGRESS NOTE    Donald Steele  [id number] DOB: [birth year] DOA: 02/21/2016  PCP: Crisoforo Oxford, PA-C   Brief Narrative:  55 y.o. male with medical history significant of uncontrolled diabetes mellitus with secondary peripheral vascular disease with chronic left lower extremity foot ulcer that has been managed by wound care. Patient states a few days ago, he dropped a plate of hot spaghetti on the wound.   Assessment & Plan: Sepsis due to Osteomyelitis of left foot and ankle - Evaluated by orthopedics Dr. Sharol Given. MRI of foot reviewed.  - Postoperative day 5, s/p left transtibial amputation.  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 Diabetic diet.    Chronic combined systolic and diastolic CHF (congestive heart failure) ([place]) - Patient is currently euvolemic. He does not have chest pain. Stable.  Cancer of head of pancreas [facility]) - Advised outpatient follow-up with oncologist. - per Dr. Benay Spice, plan is to resume chemotherapy as an outpatient. He is scheduled for an office visit and chemotherapy on 03/05/2016. - thank you Dr. Benay Spice for visiting pt this AM  Hypokalemia - supplemented and WNL this AM  Anemia of chronic disease - drop in Hg post op - no indicated for transfusion at this time as Hg ~ 8 - repeat CBC in AM  Constipation - last BM last week - started on bowel regimen - monitor closely   Acute kidney injury  - sudden increase in Cr, still trending up, suspect vanc induced ATN  -  pt not on any nephrotoxic medications but was on vanc so this could have contributed  - renal US unremarkable, vanc has been discontinued 11/12 - urine Na and Cr still pending  - BMP in AM  DVT prophylaxis: Was on Lovenox but this was held pre op.  Code Status: Full  Family Communication: Patient at bedside  Disposition Plan: SNF in AM if renal function stable and if pt has BM  Consultants:   Ortho   Procedures:   Left transtibial amputation 11/09 -->  Antimicrobials:   Vancomycin 11/07 --> 11/12  Zosyn 11/07 --> 11/12  Subjective: No events overnight. Reports feeling sicker this AM, has not had BM since last week and feels nauseated.   Objective: Vitals:   02/27/16 0604 02/27/16 1500 02/27/16 1945 02/28/16 0540  BP: 132/65 131/71 135/75 109/63  Pulse: 83 81 85 77  Resp:  16 16 16   Temp: 98.7 F (37.1 C) 98.3 F (36.8 C) 97.9 F (36.6 C) 98.1 F (36.7 C)  TempSrc: Oral  Oral Oral  SpO2: 100% 99% 99% 100%  Weight:      Height:        Intake/Output Summary (Last 24 hours) at 02/28/16 1027 Last data filed at 02/28/16 0900  Gross per 24 hour  Intake              600 ml  Output  1000 ml  Net             -400 ml   Filed Weights   02/21/16 1109 02/21/16 1720  Weight: 81.2 kg (179 lb) 78.9 kg (174 lb)    Examination:  General exam: Appears calm and comfortable  Respiratory system: Clear to auscultation. Respiratory effort normal. Cardiovascular system: S1 & S2 heard, RRR. No JVD, murmurs, rubs, gallops or clicks. No pedal edema. Gastrointestinal system: Abdomen is nondistended, soft and nontender. No organomegaly or masses felt.   Data Reviewed: I have personally reviewed following labs and imaging studies  CBC:  Recent Labs Lab 02/21/16 1226 02/21/16 1947  02/24/16 0500 02/25/16 0427 02/26/16 0500 02/27/16 0524 02/28/16 0505  WBC 23.3* 27.9*  < > 17.3* 12.9* 13.0* 11.4* 11.8*  NEUTROABS 18.4* 22.1*  --   --   --   --   --   --   HGB  8.8* 9.5*  < > 7.9* 8.0* 8.0* 8.0* 8.0*  HCT 27.5* 29.1*  < > 25.4* 25.8* 25.9* 25.7* 25.8*  MCV 78.3 78.4  < > 78.9 78.7 78.5 78.8 79.6  PLT 581* 612*  < > 615* 639* 670* 666* 615*  < > = values in this interval not displayed. Basic Metabolic Panel:  Recent Labs Lab 02/24/16 0500 02/25/16 0427 02/26/16 0500 02/27/16 0524 02/28/16 0505  NA 137 137 139 137 137  K 3.8 3.5 3.8 3.6 3.6  CL 101 99* 98* 98* 99*  CO2 27 30 30 28 29   GLUCOSE 70 84 74 100* 62*  BUN 7 7 8 10 9   CREATININE 0.90 1.05 1.52* 1.72* 1.83*  CALCIUM 8.4* 8.4* 8.6* 8.4* 8.5*   Liver Function Tests:  Recent Labs Lab 02/21/16 1226  AST 13*  ALT 10*  ALKPHOS 133*  BILITOT 0.8  PROT 7.0  ALBUMIN 2.5*    Recent Labs Lab 02/21/16 1226  LIPASE 11   CBG:  Recent Labs Lab 02/27/16 0609 02/27/16 1143 02/27/16 1655 02/27/16 2105 02/28/16 0621  GLUCAP 115* 136* 95 105* 67   Urine analysis:    Component Value Date/Time   COLORURINE YELLOW 12/27/2015 1435   APPEARANCEUR CLEAR 12/27/2015 1435   LABSPEC 1.015 12/27/2015 1435   PHURINE 5.5 12/27/2015 1435   GLUCOSEU NEGATIVE 12/27/2015 1435   HGBUR NEGATIVE 12/27/2015 1435   BILIRUBINUR NEGATIVE 12/27/2015 1435   BILIRUBINUR n 11/15/2015 1249   KETONESUR NEGATIVE 12/27/2015 1435   PROTEINUR NEGATIVE 12/27/2015 1435   UROBILINOGEN 0.2 11/15/2015 1249   NITRITE NEGATIVE 12/27/2015 1435   LEUKOCYTESUR NEGATIVE 12/27/2015 1435   Recent Results (from the past 240 hour(s))  Culture, blood (routine x 2)     Status: None   Collection Time: 02/21/16 12:30 PM  Result Value Ref Range Status   Specimen Description BLOOD PORTA CATH  Final   Special Requests BOTTLES DRAWN AEROBIC AND ANAEROBIC 5ML  Final   Culture   Final    NO GROWTH 5 DAYS Performed at [facility]    Report Status 02/26/2016 FINAL  Final  Culture, blood (routine x 2)     Status: None   Collection Time: 02/21/16  2:04 PM  Result Value Ref Range Status   Specimen Description  BLOOD RIGHT HAND  Final   Special Requests IN PEDIATRIC BOTTLE [place]  Final   Culture   Final    NO GROWTH 5 DAYS Performed at [facility]    Report Status 02/26/2016 FINAL  Final  Surgical PCR screen  Status: None   Collection Time: 02/22/16  4:30 AM  Result Value Ref Range Status   MRSA, PCR NEGATIVE NEGATIVE Final   Staphylococcus aureus NEGATIVE NEGATIVE Final    Comment:        The Xpert SA Assay (FDA approved for NASAL specimens in patients over 53 years of age), is one component of a comprehensive surveillance program.  Test performance has been validated by [facility] for patients greater than or equal to 68 year old. It is not intended to diagnose infection nor to guide or monitor treatment.       Radiology Studies: US Renal  Result Date: 02/27/2016 CLINICAL DATA:  Acute renal failure. EXAM: RENAL / URINARY TRACT ULTRASOUND COMPLETE COMPARISON:  CT scan 11/30/2015 FINDINGS: Right Kidney: Length: 11.7 cm. Normal renal cortical thickness and echogenicity without focal lesions or hydronephrosis. Left Kidney: Length: 12.3 cm. Normal renal cortical thickness and echogenicity without focal lesions or hydronephrosis. Bladder: Appears normal for degree of bladder distention. IMPRESSION: Normal renal ultrasound examination. Electronically Signed   By: Marijo Sanes M.D.   On: 02/27/2016 14:50      Scheduled Meds: . atorvastatin  40 mg Oral Daily  . feeding supplement (ENSURE ENLIVE)  237 mL Oral BID BM  . feeding supplement (PRO-STAT SUGAR FREE 64)  30 mL Oral BID  . insulin aspart  0-5 Units Subcutaneous QHS  . insulin aspart  0-9 Units Subcutaneous TID WC  . insulin detemir  5 Units Subcutaneous QHS  . senna  1 tablet Oral BID  . senna-docusate  1 tablet Oral BID   Continuous Infusions: . sodium chloride       LOS: 7 days   Time spent: 20 minutes   Faye Ramsay, MD [facility] Pager [phone]  If 7PM-7AM, please contact  night-coverage [email or web address] Password [email or web address] 02/28/2016, 10:27 AM

## 2016-02-28 NOTE — Progress Notes (Signed)
Patient ID: Donald Steele, male   DOB: 03/20/1961, 55 y.o.   MRN: [id number] Status post transtibial amputation the left. The dressing is clean and dry no drainage and the wound VAC. Patient feels like he is being rest but feels like he is making progress. Patient is still not sure whether he needs to go home or to skilled nursing.

## 2016-02-28 NOTE — Progress Notes (Signed)
Inpatient Diabetes Program Recommendations  AACE/ADA: New Consensus Statement on Inpatient Glycemic Control (2015)  Target Ranges:  Prepandial:   less than 140 mg/dL      Peak postprandial:   less than 180 mg/dL (1-2 hours)      Critically ill patients:  140 - 180 mg/dL   Results for LAWARNCE, CHUGH (MRN [id number]) as of 02/28/2016 08:03  Ref. Range 02/27/2016 06:09 02/27/2016 11:43 02/27/2016 16:55 02/27/2016 21:05 02/28/2016 06:21  Glucose-Capillary Latest Ref Range: 65 - 99 mg/dL 115 (H) 136 (H) 95 105 (H) 67   Review of Glycemic Control  Current orders for Inpatient glycemic control: Levemir 7 units QHS, Novolog 0-9 units TID with meals, Novolog 0-5 units QHS  Inpatient Diabetes Program Recommendations: Insulin - Basal: Fasting lab glucose 62 mg/dl and finger stick 67 mg/dl this morning. Please consider decreasing Levemir to 5 units QHS.  Thanks, Barnie Alderman, RN, MSN, CDE Diabetes Coordinator Inpatient Diabetes Program [phone] (Team Pager from 8am to 5pm)

## 2016-02-28 NOTE — Progress Notes (Signed)
IP PROGRESS NOTE  Subjective:   Donald Steele is known to me with a history of pancreas cancer. He was admitted with osteomyelitis of the left lower extremity and underwent a trans-tibial amputation on 02/23/2016. Mr. Letter has locally advanced pancreas cancer and was last treated with chemotherapy 02/06/2016. He missed the last scheduled treatment secondary to the current hospitalization.  He has no complaint this morning.  Objective: Vital signs in last 24 hours: Blood pressure 109/63, pulse 77, temperature 98.1 F (36.7 C), temperature source Oral, resp. rate 16, height 5\' 11"  (1.803 m), weight 174 lb (78.9 kg), SpO2 100 %.  Intake/Output from previous day: 11/13 0701 - 11/14 0700 In: 540 [P.O.:540] Out: 1000 [Urine:1000]  Physical Exam:  Lungs: Clear bilaterally Cardiac: Regular rate and rhythm Abdomen: No hepatomegaly, no mass, nontender Extremities: Status post left Tran's tibial amputation, wound VAC in place   Portacath/PICC-without erythema  Lab Results:  Recent Labs  02/27/16 0524 02/28/16 0505  WBC 11.4* 11.8*  HGB 8.0* 8.0*  HCT 25.7* 25.8*  PLT 666* 615*    BMET  Recent Labs  02/27/16 0524 02/28/16 0505  NA 137 137  K 3.6 3.6  CL 98* 99*  CO2 28 29  GLUCOSE 100* 62*  BUN 10 9  CREATININE 1.72* 1.83*  CALCIUM 8.4* 8.5*    Studies/Results: US Renal  Result Date: 02/27/2016 CLINICAL DATA:  Acute renal failure. EXAM: RENAL / URINARY TRACT ULTRASOUND COMPLETE COMPARISON:  CT scan 11/30/2015 FINDINGS: Right Kidney: Length: 11.7 cm. Normal renal cortical thickness and echogenicity without focal lesions or hydronephrosis. Left Kidney: Length: 12.3 cm. Normal renal cortical thickness and echogenicity without focal lesions or hydronephrosis. Bladder: Appears normal for degree of bladder distention. IMPRESSION: Normal renal ultrasound examination. Electronically Signed   By: Marijo Sanes M.D.   On: 02/27/2016 14:50    Medications: I have reviewed the  patient's current medications.  Assessment/Plan: 1. 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 Initiation of gemcitabine/Abraxane every 2 weeks beginning 01/09/2016 2. Diabetes  3. Peripheral vascular disease, status post stenting of the left subclavian artery and peripheral bypass surgery  4. Cardiomyopathy  5. Leg and foot ulcer secondary to diabetes and peripheral vascular disease-followed at the wound clinic  Admit 02/21/2016 with osteomyelitis and gangrene of left lower extremity, status post a transtibial amputation on 02/23/2016  6. Obstructive jaundice secondary to #1, status post placement of anexternal-internal biliary drain 12/03/2015, stent internalized 12/22/2015; bilirubin normal 01/09/2016  7. Anorexia/weight loss  8. Port-A-Cath placed 12/29/2015  9. Abdominal pain secondary to pancreas cancer-improved   Mr. Oros is recovering from the amputation procedure. The plan is to resume chemotherapy as an outpatient. He is scheduled for an office visit and chemotherapy on 03/05/2016. Please call Oncology as needed.    LOS: 7 days   Betsy Coder, MD   02/28/2016, 8:33 AM

## 2016-02-29 LAB — GLUCOSE, CAPILLARY
GLUCOSE-CAPILLARY: 111 mg/dL — AB (ref 65–99)
GLUCOSE-CAPILLARY: 134 mg/dL — AB (ref 65–99)
Glucose-Capillary: 148 mg/dL — ABNORMAL HIGH (ref 65–99)

## 2016-02-29 LAB — BASIC METABOLIC PANEL
Anion gap: 8 (ref 5–15)
BUN: 10 mg/dL (ref 6–20)
CHLORIDE: 99 mmol/L — AB (ref 101–111)
CO2: 30 mmol/L (ref 22–32)
Calcium: 8.6 mg/dL — ABNORMAL LOW (ref 8.9–10.3)
Creatinine, Ser: 1.76 mg/dL — ABNORMAL HIGH (ref 0.61–1.24)
GFR calc non Af Amer: 42 mL/min — ABNORMAL LOW (ref >60)
GFR, EST AFRICAN AMERICAN: 48 mL/min — AB (ref >60)
Glucose, Bld: 114 mg/dL — ABNORMAL HIGH (ref 65–99)
POTASSIUM: 3.6 mmol/L (ref 3.5–5.1)
SODIUM: 137 mmol/L (ref 135–145)

## 2016-02-29 LAB — CBC
HEMATOCRIT: 26.4 % — AB (ref 39.0–52.0)
HEMOGLOBIN: 8 g/dL — AB (ref 13.0–17.0)
MCH: 24.1 pg — AB (ref 26.0–34.0)
MCHC: 30.3 g/dL (ref 30.0–36.0)
MCV: 79.5 fL (ref 78.0–100.0)
Platelets: 661 10*3/uL — ABNORMAL HIGH (ref 150–400)
RBC: 3.32 MIL/uL — AB (ref 4.22–5.81)
RDW: 16.5 % — ABNORMAL HIGH (ref 11.5–15.5)
WBC: 11.2 10*3/uL — ABNORMAL HIGH (ref 4.0–10.5)

## 2016-02-29 MED ORDER — ENSURE ENLIVE PO LIQD: 237.0000 mL | Freq: Two times a day (BID) | ORAL | 1 refills | Status: DC

## 2016-02-29 MED ORDER — POLYETHYLENE GLYCOL 3350 17 G PO PACK: 17.0000 g | PACK | Freq: Every day | ORAL | 0 refills | Status: DC | PRN

## 2016-02-29 MED ORDER — INSULIN GLARGINE 100 UNIT/ML ~~LOC~~ SOLN: 10.0000 [IU] | Freq: Every day | SUBCUTANEOUS | 0 refills | Status: DC

## 2016-02-29 NOTE — Clinical Social Work Note (Signed)
CSW spoke with patient and daughter (Monday 11/13).  Both state the patient will go home at time of discharge with around the clock family support.  RNCM was consulted.  CSW signing off.  Disposition: Home with family support  Nonnie Done, MSW, LCSW  ([id number]) [phone]  Licensed Clinical Social Worker

## 2016-02-29 NOTE — Progress Notes (Signed)
Patient ID: Donald Steele, male   DOB: Aug 25, 1960, 55 y.o.   MRN: [id number] Patient is progressing well from an orthopedic standpoint and may be discharged at this time.

## 2016-02-29 NOTE — Discharge Summary (Signed)
Physician Discharge Summary  Donald Steele QVZ:[id number] DOB: Feb 08, 1961 DOA: 02/21/2016  PCP: Crisoforo Oxford, PA-C  Admit date: 02/21/2016 Discharge date: 02/29/2016  Admitted From: Home Disposition:  Home  Recommendations for Outpatient Follow-up:  1. Follow up with PCP in 1 week 2. Follow-up with orthopedic surgery in 2 weeks 3. Wound vac as an outpatient 4. Follow-up with oncology on 03/05/2016 5. Bowel movement 6. Repeat BMP in 1 week to check creatinine  Home Health: PT, RN  Equipment/Devices: Wound Vac  Discharge Condition: Stable CODE STATUS: Full code    Brief/Interim Summary:  HPI written by Dr. Gevena Barre on 02/21/2016  HPI: Donald Steele is a 55 y.o. male with medical history significant of uncontrolled diabetes mellitus with secondary peripheral vascular disease with chronic left lower extremity foot ulcer that has been managed by wound care. Patient states a few days ago, he dropped a plate of hot spaghetti on the wound. He was seen today wound care which noted worsening ulcerations of his send him over to the emergency room.   ED Course: The emergency room, patient was noted to have a white count of 23.3 one week ago was normal. He is also noted to have an elevated platelet count of 581. Lactic acid level was normal at 1.12. Tib-fib fib x-rays done noted soft tissue defect and irregularity with adjacent cortical irregularity and ostial lysis concerning for ulcer with secondary osteomyelitis as well as some soft tissue air represented possibly gangrene and necrotizing fasciitis. Foot x-rays noted signs consistent with septic arthritis, adjacent osteomyelitis involving the PIP joint of left fifth digit. Orthopedics were notified who after evaluating patient ordered MRI and plan for transmetatarsal agitation, possibly tomorrow. In the meantime, patient will need IV antibiotics and hospitalist was called for evaluation and admission.   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 Orthopedic surgery evaluated and recommended transmetatarsal amputation. Patient transferred to [facility] for this procedure. Patient received 3 days of IV antibiotics before surgery on 11/9 followed by two more days post op. Wound vac placed on stump for healing and orthopedic surgery continued to manage. Blood cultures were negative.  Type 2 diabetes mellitus Patient was on Insulin Detemir 5u while inpatient. Discharged on Lantus 10u day and sliding scale insulin continued.  Chronic combined systolic and diastolic CHF Compensated and euvolemic  Cancer of head of pancreas Evaluated by Dr. Benay Spice. Plan for follow-up as an outpatient to resume chemotherapy.   Hypokalemia Improved with supplementation  Anemia of chronic disease Dropped to 8 post up and remained stable. Asymptomatic.  Constipation Likely secondary to decrease by mouth intake. No abdominal symptoms. Bowel regimen.  Acute kidney injury Creatinine increased likely secondary to vancomycin. Vancomycin discontinued and creatinine reached plateau before trending down.   Discharge Diagnoses:  Principal Problem:   Osteomyelitis of ankle and foot ([place]) Active Problems:   Type 2 diabetes mellitus with diabetic peripheral angiopathy and gangrene, with long-term current use of insulin (HCC)   PAD (peripheral artery disease) (HCC)   Chronic combined systolic and diastolic CHF (congestive heart failure) (HCC)   Ischemic cardiomyopathy   Cancer of head of pancreas (HCC)   Diabetic foot ulcer ([place])   Protein-calorie malnutrition, severe    Discharge Instructions     Medication List    STOP taking these medications   clopidogrel 75 MG tablet Commonly known as:  PLAVIX  collagenase ointment Commonly known as:  SANTYL   insulin degludec 100 UNIT/ML Sopn  FlexTouch Pen Commonly known as:  TRESIBA     TAKE these medications   aspirin 81 MG EC tablet Take 1 tablet (81 mg total) by mouth daily. Swallow whole.   atorvastatin 40 MG tablet Commonly known as:  LIPITOR Take 1 tablet (40 mg total) by mouth daily.   feeding supplement (ENSURE ENLIVE) Liqd Take 237 mLs by mouth 2 (two) times daily between meals. Start taking on:  03/01/2016   glucose blood test strip Commonly known as:  ONETOUCH VERIO Test twice a day   insulin glargine 100 UNIT/ML injection Commonly known as:  LANTUS Inject 0.1 mLs (10 Units total) into the skin at bedtime. What changed:  how much to take   insulin lispro 100 UNIT/ML injection Commonly known as:  HUMALOG Inject 5 Units into the skin 3 (three) times daily before meals. Take 5 units at mealtime.  If you skip a meal, then just take 2.5 unites.  This is in addition to the long acting pen at night. What changed:  Another medication with the same name was removed. Continue taking this medication, and follow the directions you see here.   lidocaine-prilocaine cream Commonly known as:  EMLA Apply 1 application topically as needed. Apply to port site 1 hr prior to port access and cover with plastic wrap   MICROLET LANCETS Misc Test twice a day. Pt  Uses one touch verio meter   morphine 15 MG 12 hr tablet Commonly known as:  MS CONTIN Take 1 tablet (15 mg total) by mouth every 12 (twelve) hours. What changed:  when to take this  reasons to take this   NEEDLE (DISP) 30 G 30G X 1/2" Misc Commonly known as:  BD DISP NEEDLES 1 each by Does not apply route at bedtime.   ONETOUCH VERIO w/Device Kit 1 kit by Does not apply route daily.   polyethylene glycol packet Commonly known as:  MIRALAX / GLYCOLAX Take 17 g by mouth daily as needed for mild constipation.            Durable Medical Equipment        Start     Ordered   02/24/16 1040  For home use only DME lightweight manual wheelchair with  seat cushion  Once    Comments:  Patient suffers from left BKA which impairs their ability to perform daily activities like ambulating in the home.  A  cane will not resolve  issue with performing activities of daily living. A wheelchair will allow patient to safely perform daily activities. Patient is not able to propel themselves in the home using a standard weight wheelchair due to deconditioning. Patient can self propel in the lightweight wheelchair.  Accessories: elevating leg rests (ELRs), wheel locks, extensions and anti-tippers.   02/24/16 1041   02/24/16 1039  For home use only DME Hospital bed  Once    Question Answer Comment  Patient has (list medical condition): Left BKA   The above medical condition requires: Patient requires the ability to reposition frequently   Head must be elevated greater than: 45 degrees   Bed type Semi-electric      02/24/16 1041   02/21/16 1638  For home use only DME Bedside commode  Once     02/21/16 1638     Follow-up Information    Newt Minion, MD Follow up in 2 week(s).   Specialty:  Orthopedic Surgery  Contact information: [place] [address] [place] Follow up.   Why:  Someone from [place] will contact you to arrange start date and time for therapy. Contact information: [place] [address] [phone]        Crisoforo Oxford, PA-C. Schedule an appointment as soon as possible for a visit in 1 week(s).   Specialty:  Family Medicine Why:  Hospital follow-up Contact information: [address] [place] [address] [phone]          No Known Allergies  Consultations:  Orthopedic surgery   Procedures/Studies: Dg Tibia/fibula Left  Result Date: 02/21/2016 CLINICAL DATA:  Diabetic ulcer distal left tibia fibula EXAM: LEFT TIBIA AND FIBULA - 2 VIEW COMPARISON:  None. FINDINGS: Four views of the left tibia  fibula submitted. There is soft tissue defect/ irregularity probable ulcer anterior tibial region. There is adjacent cortical irregularity and osteo lysis anterior aspect of the tibial shaft findings are highly suspicious for osteomyelitis. Further correlation with MRI is recommended. There is soft tissue air anterior to the distal ankle. Findings may represent gangrene or necrotizing fasciitis. IMPRESSION: There is soft tissue defect/ irregularity probable ulcer mid anterior tibial region. There is adjacent cortical irregularity and osteo lysis anterior aspect of the tibial shaft findings are highly suspicious for osteomyelitis. Further correlation with MRI is recommended. There is soft tissue air anterior to the distal ankle. Findings may represent gangrene or necrotizing fasciitis. These results were called by telephone at the time of interpretation on 02/21/2016 at 1:28 pm to Dr. Carmin Muskrat , who verbally acknowledged these results. Electronically Signed   By: Lahoma Crocker M.D.   On: 02/21/2016 13:28   US Renal  Result Date: 02/27/2016 CLINICAL DATA:  Acute renal failure. EXAM: RENAL / URINARY TRACT ULTRASOUND COMPLETE COMPARISON:  CT scan 11/30/2015 FINDINGS: Right Kidney: Length: 11.7 cm. Normal renal cortical thickness and echogenicity without focal lesions or hydronephrosis. Left Kidney: Length: 12.3 cm. Normal renal cortical thickness and echogenicity without focal lesions or hydronephrosis. Bladder: Appears normal for degree of bladder distention. IMPRESSION: Normal renal ultrasound examination. Electronically Signed   By: Marijo Sanes M.D.   On: 02/27/2016 14:50   Mr Tibia Fibula Left Wo Contrast  Result Date: 02/22/2016 CLINICAL DATA:  Chronic lower leg wound/infection. EXAM: MRI OF LOWER LEFT EXTREMITY WITHOUT CONTRAST; MRI OF THE LEFT FOREFOOT WITHOUT AND WITH CONTRAST; MRI OF THE LEFT ANKLE WITHOUT AND WITH CONTRAST TECHNIQUE: Multiplanar, multisequence MR imaging of the left lower  extremity was performed both before and after administration of intravenous contrast. CONTRAST:  59m MULTIHANCE GADOBENATE DIMEGLUMINE 529 MG/ML IV SOLN COMPARISON:  Radiographs, same date. FINDINGS: Chronic open wound involving the lower tibial area anteriorly with air dissecting into the subcutaneous soft tissues down into the ankle area. Diffuse cellulitis and myofasciitis without discrete drainable soft tissue abscess. Diffuse cortical thickening involving the anterior aspect of the tibia with a small focus of mild increased STIR signal intensity in the thickened cortex in the area of the open wound. No significant marrow edema but there appears to be slight enhancement. Findings suspicious for osteomyelitis. Moderate surrounding myofasciitis. There is osteomyelitis involving the proximal and middle phalanges of the fifth toe with septic arthritis at the proximal IP joint. Possible early changes of osteomyelitis also involving the fourth proximal phalanx. No findings for septic arthritis at the ankle joint. There are moderate midfoot degenerative changes but  no definite findings for septic arthritis in this area. IMPRESSION: 1. Large open wound along the anterior aspect of the lower tibia with findings suspicious for early osteomyelitis involving the anterior cortex. 2. Diffuse cellulitis and myofasciitis without discrete drainable soft tissue abscess or pyomyositis. 3. The septic arthritis involving the proximal interphalangeal joint of the fifth toe with osteomyelitis involving the proximal and middle phalanges. Suspect osteomyelitis also involving the fourth proximal phalanx. Electronically Signed   By: Marijo Sanes M.D.   On: 02/22/2016 08:15   Mr Foot Left W Wo Contrast  Result Date: 02/22/2016 CLINICAL DATA:  Chronic lower leg wound/infection. EXAM: MRI OF LOWER LEFT EXTREMITY WITHOUT CONTRAST; MRI OF THE LEFT FOREFOOT WITHOUT AND WITH CONTRAST; MRI OF THE LEFT ANKLE WITHOUT AND WITH CONTRAST  TECHNIQUE: Multiplanar, multisequence MR imaging of the left lower extremity was performed both before and after administration of intravenous contrast. CONTRAST:  40m MULTIHANCE GADOBENATE DIMEGLUMINE 529 MG/ML IV SOLN COMPARISON:  Radiographs, same date. FINDINGS: Chronic open wound involving the lower tibial area anteriorly with air dissecting into the subcutaneous soft tissues down into the ankle area. Diffuse cellulitis and myofasciitis without discrete drainable soft tissue abscess. Diffuse cortical thickening involving the anterior aspect of the tibia with a small focus of mild increased STIR signal intensity in the thickened cortex in the area of the open wound. No significant marrow edema but there appears to be slight enhancement. Findings suspicious for osteomyelitis. Moderate surrounding myofasciitis. There is osteomyelitis involving the proximal and middle phalanges of the fifth toe with septic arthritis at the proximal IP joint. Possible early changes of osteomyelitis also involving the fourth proximal phalanx. No findings for septic arthritis at the ankle joint. There are moderate midfoot degenerative changes but no definite findings for septic arthritis in this area. IMPRESSION: 1. Large open wound along the anterior aspect of the lower tibia with findings suspicious for early osteomyelitis involving the anterior cortex. 2. Diffuse cellulitis and myofasciitis without discrete drainable soft tissue abscess or pyomyositis. 3. The septic arthritis involving the proximal interphalangeal joint of the fifth toe with osteomyelitis involving the proximal and middle phalanges. Suspect osteomyelitis also involving the fourth proximal phalanx. Electronically Signed   By: PMarijo SanesM.D.   On: 02/22/2016 08:15   Mr Ankle Left W Wo Contrast  Result Date: 02/22/2016 CLINICAL DATA:  Chronic lower leg wound/infection. EXAM: MRI OF LOWER LEFT EXTREMITY WITHOUT CONTRAST; MRI OF THE LEFT FOREFOOT WITHOUT AND  WITH CONTRAST; MRI OF THE LEFT ANKLE WITHOUT AND WITH CONTRAST TECHNIQUE: Multiplanar, multisequence MR imaging of the left lower extremity was performed both before and after administration of intravenous contrast. CONTRAST:  155mMULTIHANCE GADOBENATE DIMEGLUMINE 529 MG/ML IV SOLN COMPARISON:  Radiographs, same date. FINDINGS: Chronic open wound involving the lower tibial area anteriorly with air dissecting into the subcutaneous soft tissues down into the ankle area. Diffuse cellulitis and myofasciitis without discrete drainable soft tissue abscess. Diffuse cortical thickening involving the anterior aspect of the tibia with a small focus of mild increased STIR signal intensity in the thickened cortex in the area of the open wound. No significant marrow edema but there appears to be slight enhancement. Findings suspicious for osteomyelitis. Moderate surrounding myofasciitis. There is osteomyelitis involving the proximal and middle phalanges of the fifth toe with septic arthritis at the proximal IP joint. Possible early changes of osteomyelitis also involving the fourth proximal phalanx. No findings for septic arthritis at the ankle joint. There are moderate midfoot degenerative changes but no definite  findings for septic arthritis in this area. IMPRESSION: 1. Large open wound along the anterior aspect of the lower tibia with findings suspicious for early osteomyelitis involving the anterior cortex. 2. Diffuse cellulitis and myofasciitis without discrete drainable soft tissue abscess or pyomyositis. 3. The septic arthritis involving the proximal interphalangeal joint of the fifth toe with osteomyelitis involving the proximal and middle phalanges. Suspect osteomyelitis also involving the fourth proximal phalanx. Electronically Signed   By: Marijo Sanes M.D.   On: 02/22/2016 08:15   Dg Foot 2 Views Left  Result Date: 02/21/2016 CLINICAL DATA:  Diabetes.  Ulceration left fifth toe. EXAM: DG C-ARM 61-120 MIN-NO  REPORT; LEFT FOOT - 2 VIEW COMPARISON:  11/22/2015. FINDINGS: Soft tissue swelling noted over the left fifth digit. Erosive changes noted the base of the middle phalanx and distal aspect of the proximal phalanx of the left fifth digit. These findings suggest septic arthritis with osteomyelitis. Soft tissue air is noted over the dorsum of the foot consistent with soft tissue infection/ gangrene. Peripheral vascular calcification. IMPRESSION: 1. Findings consistent with septic arthritis and adjacent osteomyelitis involving the proximal interphalangeal joint of the left fifth digit. 2. Soft tissue swelling with diffuse soft tissue air noted over the dorsum of the foot ankle consistent with soft tissue infection/gangrene. 3.  Peripheral vascular disease. These results will be called to the ordering clinician or representative by the Radiologist Assistant, and communication documented in the PACS or zVision Dashboard. Electronically Signed   By: Marcello Moores  Register   On: 02/21/2016 13:07     Subjective: Patient reports no issues overnight.  Discharge Exam: Vitals:   02/29/16 0419 02/29/16 1428  BP: (!) 96/48 127/68  Pulse: 86 87  Resp: 16 16  Temp: 98.4 F (36.9 C) 98.4 F (36.9 C)   Vitals:   02/28/16 1500 02/28/16 2100 02/29/16 0419 02/29/16 1428  BP: 107/63 127/63 (!) 96/48 127/68  Pulse: 81 80 86 87  Resp: 16 16 16 16   Temp: 98.1 F (36.7 C) 99.1 F (37.3 C) 98.4 F (36.9 C) 98.4 F (36.9 C)  TempSrc: Oral Oral Oral Oral  SpO2: 100% 100% 100% 100%  Weight:      Height:        General: Pt is alert, awake, not in acute distress. Thin. Cardiovascular: RRR, S1/S2 +, no rubs, no gallops Respiratory: CTA bilaterally, no wheezing, no rhonchi Abdominal: Soft, NT, ND, bowel sounds + Extremities: no edema, no cyanosis. Left BKA with wound vac in place. No erythema present    The results of significant diagnostics from this hospitalization (including imaging, microbiology, ancillary and  laboratory) are listed below for reference.     Microbiology: Recent Results (from the past 240 hour(s))  Culture, blood (routine x 2)     Status: None   Collection Time: 02/21/16 12:30 PM  Result Value Ref Range Status   Specimen Description BLOOD PORTA CATH  Final   Special Requests BOTTLES DRAWN AEROBIC AND ANAEROBIC 5ML  Final   Culture   Final    NO GROWTH 5 DAYS Performed at [facility]    Report Status 02/26/2016 FINAL  Final  Culture, blood (routine x 2)     Status: None   Collection Time: 02/21/16  2:04 PM  Result Value Ref Range Status   Specimen Description BLOOD RIGHT HAND  Final   Special Requests IN PEDIATRIC BOTTLE [place]  Final   Culture   Final    NO GROWTH 5 DAYS Performed at [facility]  Hospital    Report Status 02/26/2016 FINAL  Final  Surgical PCR screen     Status: None   Collection Time: 02/22/16  4:30 AM  Result Value Ref Range Status   MRSA, PCR NEGATIVE NEGATIVE Final   Staphylococcus aureus NEGATIVE NEGATIVE Final    Comment:        The Xpert SA Assay (FDA approved for NASAL specimens in patients over 31 years of age), is one component of a comprehensive surveillance program.  Test performance has been validated by [facility] for patients greater than or equal to 59 year old. It is not intended to diagnose infection nor to guide or monitor treatment.      Labs: BNP (last 3 results) No results for input(s): BNP in the last 8760 hours. Basic Metabolic Panel:  Recent Labs Lab 02/25/16 0427 02/26/16 0500 02/27/16 0524 02/28/16 0505 02/29/16 0445  NA 137 139 137 137 137  K 3.5 3.8 3.6 3.6 3.6  CL 99* 98* 98* 99* 99*  CO2 30 30 28 29 30   GLUCOSE 84 74 100* 62* 114*  BUN 7 8 10 9 10   CREATININE 1.05 1.52* 1.72* 1.83* 1.76*  CALCIUM 8.4* 8.6* 8.4* 8.5* 8.6*   Liver Function Tests: No results for input(s): AST, ALT, ALKPHOS, BILITOT, PROT, ALBUMIN in the last 168 hours. No results for input(s): LIPASE, AMYLASE in the last 168  hours. No results for input(s): AMMONIA in the last 168 hours. CBC:  Recent Labs Lab 02/25/16 0427 02/26/16 0500 02/27/16 0524 02/28/16 0505 02/29/16 0445  WBC 12.9* 13.0* 11.4* 11.8* 11.2*  HGB 8.0* 8.0* 8.0* 8.0* 8.0*  HCT 25.8* 25.9* 25.7* 25.8* 26.4*  MCV 78.7 78.5 78.8 79.6 79.5  PLT 639* 670* 666* 615* 661*   Cardiac Enzymes: No results for input(s): CKTOTAL, CKMB, CKMBINDEX, TROPONINI in the last 168 hours. BNP: Invalid input(s): POCBNP CBG:  Recent Labs Lab 02/28/16 1646 02/28/16 2134 02/29/16 0635 02/29/16 1129 02/29/16 1648  GLUCAP 189* 165* 111* 134* 148*   D-Dimer No results for input(s): DDIMER in the last 72 hours. Hgb A1c No results for input(s): HGBA1C in the last 72 hours. Lipid Profile No results for input(s): CHOL, HDL, LDLCALC, TRIG, CHOLHDL, LDLDIRECT in the last 72 hours. Thyroid function studies No results for input(s): TSH, T4TOTAL, T3FREE, THYROIDAB in the last 72 hours.  Invalid input(s): FREET3 Anemia work up No results for input(s): VITAMINB12, FOLATE, FERRITIN, TIBC, IRON, RETICCTPCT in the last 72 hours. Urinalysis    Component Value Date/Time   COLORURINE YELLOW 12/27/2015 1435   APPEARANCEUR CLEAR 12/27/2015 1435   LABSPEC 1.015 12/27/2015 1435   PHURINE 5.5 12/27/2015 1435   GLUCOSEU NEGATIVE 12/27/2015 1435   HGBUR NEGATIVE 12/27/2015 1435   BILIRUBINUR NEGATIVE 12/27/2015 1435   BILIRUBINUR n 11/15/2015 1249   KETONESUR NEGATIVE 12/27/2015 1435   PROTEINUR NEGATIVE 12/27/2015 1435   UROBILINOGEN 0.2 11/15/2015 1249   NITRITE NEGATIVE 12/27/2015 1435   LEUKOCYTESUR NEGATIVE 12/27/2015 1435   Sepsis Labs Invalid input(s): PROCALCITONIN,  WBC,  LACTICIDVEN Microbiology Recent Results (from the past 240 hour(s))  Culture, blood (routine x 2)     Status: None   Collection Time: 02/21/16 12:30 PM  Result Value Ref Range Status   Specimen Description BLOOD PORTA CATH  Final   Special Requests BOTTLES DRAWN AEROBIC AND  ANAEROBIC 5ML  Final   Culture   Final    NO GROWTH 5 DAYS Performed at [facility]    Report Status 02/26/2016 FINAL  Final  Culture, blood (routine x 2)     Status: None   Collection Time: 02/21/16  2:04 PM  Result Value Ref Range Status   Specimen Description BLOOD RIGHT HAND  Final   Special Requests IN PEDIATRIC BOTTLE [place]  Final   Culture   Final    NO GROWTH 5 DAYS Performed at [facility]    Report Status 02/26/2016 FINAL  Final  Surgical PCR screen     Status: None   Collection Time: 02/22/16  4:30 AM  Result Value Ref Range Status   MRSA, PCR NEGATIVE NEGATIVE Final   Staphylococcus aureus NEGATIVE NEGATIVE Final    Comment:        The Xpert SA Assay (FDA approved for NASAL specimens in patients over 66 years of age), is one component of a comprehensive surveillance program.  Test performance has been validated by [facility] for patients greater than or equal to 35 year old. It is not intended to diagnose infection nor to guide or monitor treatment.     Patient reevaluated on 03/01/2016 as patient ended up staying an extra night secondary to unavailability of wound vac. No change in management. Still stable for discharge.    Time coordinating discharge: Over 30 minutes  SIGNED:   Cordelia Poche, MD [facility] 02/29/2016, 6:20 PM Pager [phone]  If 7PM-7AM, please contact night-coverage [email or web address] Password [email or web address]

## 2016-02-29 NOTE — Progress Notes (Signed)
Physical Therapy Treatment Patient Details Name: Donald Steele MRN: [id number] DOB: [birth year] Today's Date: 02/29/2016    History of Present Illness 55 y/o male s/p L transtibial amputation (02/23/16) due to non healing wound.  Pt was recently diagnosed in August with pancreatic CA and had last treatment in September.  PMH includes PVD, HTN, CHF, DM    PT Comments    Patient tolerated therex and OOB transfers well this session and with less c/o pain in residual limb. Current plan remains appropriate.   Follow Up Recommendations  SNF;Supervision for mobility/OOB     Equipment Recommendations  Wheelchair (measurements PT);Wheelchair cushion (measurements PT)    Recommendations for Other Services       Precautions / Restrictions Precautions Precautions: Fall Precaution Comments: L BKA, wound vac Restrictions Weight Bearing Restrictions: Yes LLE Weight Bearing: Non weight bearing    Mobility  Bed Mobility Overal bed mobility: Needs Assistance Bed Mobility: Supine to Sit     Supine to sit: Min guard     General bed mobility comments: cues for technique; min guard for safety and line management  Transfers Overall transfer level: Needs assistance Equipment used: Rolling walker (2 wheeled);None Transfers: Sit to/from [employer] Sit to Stand: Mod assist;From elevated surface   Squat pivot transfers: Min assist     General transfer comment: sit to stand X3 from EOB working on finding COG with cues for hand/foot placement and technique and assist to power up into standing and to steady upon stand; squat pivot with no AD with min A for safe descent    Ambulation/Gait             General Gait Details: pt unable at this time   Financial trader Rankin (Stroke Patients Only)       Balance                                    Cognition Arousal/Alertness: Awake/alert Behavior During  Therapy: WFL for tasks assessed/performed;Flat affect Overall Cognitive Status: Within Functional Limits for tasks assessed                      Exercises Amputee Exercises Quad Sets: AROM;Left;10 reps;Supine Hip ABduction/ADduction: AROM;Left;10 reps;Sidelying Hip Flexion/Marching: AROM;Left;10 reps;Supine Straight Leg Raises: AROM;Left;Supine;15 reps    General Comments        Pertinent Vitals/Pain Pain Assessment: Faces Faces Pain Scale: Hurts even more Pain Location: L residual limb Pain Descriptors / Indicators: Aching;Grimacing;Guarding;Sore Pain Intervention(s): Limited activity within patient's tolerance;Monitored during session;Premedicated before session;Repositioned    Home Living                      Prior Function            PT Goals (current goals can now be found in the care plan section) Acute Rehab PT Goals Patient Stated Goal: none stated Progress towards PT goals: Progressing toward goals    Frequency    Min 3X/week      PT Plan Current plan remains appropriate    Co-evaluation             End of Session Equipment Utilized During Treatment: Gait belt Activity Tolerance: Patient tolerated treatment well Patient left: in chair;with call bell/phone within reach     Time: 1140-1228 PT  Time Calculation (min) (ACUTE ONLY): 48 min  Charges:  $Therapeutic Exercise: 8-22 mins $Therapeutic Activity: 23-37 mins                    G Codes:      Salina April, PTA Pager: [phone]   02/29/2016, 4:46 PM

## 2016-02-29 NOTE — Progress Notes (Signed)
Nutrition Follow-up  DOCUMENTATION CODES:   Severe malnutrition in context of chronic illness  INTERVENTION:  Recommend continuation of nutritional supplements once discharged home to aid in caloric and protein needs as well as in healing.   NUTRITION DIAGNOSIS:   Malnutrition related to chronic illness as evidenced by severe depletion of body fat, severe depletion of muscle mass; ongoing  GOAL:   Patient will meet greater than or equal to 90% of their needs; progressing  MONITOR:   PO intake, Supplement acceptance, Labs, Weight trends, Skin, I & O's  REASON FOR ASSESSMENT:   Consult Wound healing  ASSESSMENT:   55 year old male with history of uncontrolled diabetes, peripheral vascular disease with chronic left lower extremity foot ulcer that has been managed at wound care center, sent to ER for further evaluation of worsening on. MRI of left foot consistent with cellulitis, myofascitis and osteomyelitis.  PROCEDURE (11/9): Transtibial amputation Application of Prevena wound VAC  Meal completion has been varied from 10-100%. Pt currently has Ensure and Prostat ordered and has been consuming them recently. RD to continue with current orders. Recommend continuation of nutritional supplements post discharge to aid in caloric and protein needs as well as in wound healing.   Labs and medications reviewed.   Diet Order:  Diet Carb Modified Fluid consistency: Thin; Room service appropriate? Yes  Skin:  Wound (see comment) (Stg 1 on buttocks, incision on legs, VAC on L leg)  Last BM:  11/14  Height:   Ht Readings from Last 1 Encounters:  02/21/16 5\' 11"  (1.803 m)    Weight:   Wt Readings from Last 1 Encounters:  02/21/16 174 lb (78.9 kg)    Ideal Body Weight:  78 kg  BMI:  Body mass index is 24.27 kg/m.  Estimated Nutritional Needs:   Kcal:  2200-2400  Protein:  110-120 grams  Fluid:  2.2 - 2.4 L/day  EDUCATION NEEDS:   No education needs identified at  this time  Corrin Parker, MS, RD, LDN Pager # [phone] After hours/ weekend pager # [phone]

## 2016-02-29 NOTE — Discharge Instructions (Signed)
Mr. Wootan, you were admitted because of a bone infection. Your leg was amputated to prevent worsening of this infection. Please follow-up with Dr. Sharol Given as an outpatient in two weeks. You have been set up with a wound vac for home use in the mean time, in addition to home health services. Please follow-up with your primary care physician.

## 2016-02-29 NOTE — Care Management (Signed)
Case manager has spoken with patient and his daughter Donald Steele concerning discharge plan. Hospital bed and wheelchair have been delivered to patient's home. Shantell states that He will need to be taken home via [facility]. Case manager will notify daughter when patient is ready to leave.

## 2016-03-01 DIAGNOSIS — I255 Ischemic cardiomyopathy: Secondary | ICD-10-CM

## 2016-03-01 DIAGNOSIS — E43 Unspecified severe protein-calorie malnutrition: Secondary | ICD-10-CM

## 2016-03-01 DIAGNOSIS — I5042 Chronic combined systolic (congestive) and diastolic (congestive) heart failure: Secondary | ICD-10-CM

## 2016-03-01 LAB — GLUCOSE, CAPILLARY
Glucose-Capillary: 231 mg/dL — ABNORMAL HIGH (ref 65–99)
Glucose-Capillary: 126 mg/dL — ABNORMAL HIGH (ref 65–99)
Glucose-Capillary: 137 mg/dL — ABNORMAL HIGH (ref 65–99)

## 2016-03-01 NOTE — Progress Notes (Signed)
Called on-call for Dr. Sharol Given regard Provena wound vac.  If patient need this wound vac for dc, please provide an order.  Left a message for call back.. Call will go to day-shift RN at [phone].

## 2016-03-01 NOTE — Progress Notes (Signed)
Wound vac switched out to [facility] per physician recommendation. Instructions given to pt with demonstration of operation.

## 2016-03-01 NOTE — Progress Notes (Signed)
Patient here for osteomyelitis and is s/p left BKA.  Patient reports no issues today and is ready to go home.   BP (!) 111/59 (BP Location: Left Arm)   Pulse 83   Temp 98.2 F (36.8 C) (Oral)   Resp 16   Ht 5\' 11"  (1.803 m)   Wt 78.9 kg (174 lb)   SpO2 99%   BMI 24.27 kg/m   Plan for discharge home this afternoon.  Cordelia Poche, MD [facility] 03/01/2016, 5:54 PM Pager: [phone]

## 2016-03-02 ENCOUNTER — Other Ambulatory Visit: Payer: Self-pay | Admitting: *Deleted

## 2016-03-02 ENCOUNTER — Telehealth: Payer: Self-pay | Admitting: Oncology

## 2016-03-02 ENCOUNTER — Telehealth: Payer: Self-pay | Admitting: Medical

## 2016-03-02 ENCOUNTER — Telehealth: Payer: Self-pay | Admitting: *Deleted

## 2016-03-02 DIAGNOSIS — C250 Malignant neoplasm of head of pancreas: Secondary | ICD-10-CM

## 2016-03-02 NOTE — Telephone Encounter (Signed)
Merry Proud from [facility] calling asking for an order for nursing consult to assess sacral wound. Not related to amputation [phone]

## 2016-03-02 NOTE — Telephone Encounter (Signed)
Let him know I have reviewed the hospital reports.  We are praying for him, and I hate he had to lose his leg, but the infection was just spreading worse.    Just have him call and give me some sugar readings in a week.

## 2016-03-02 NOTE — Telephone Encounter (Signed)
Called pt about recent hospital stay.pt states he is filling much better. Pt states he can not make an appt to come up. He states that everything is up in the air and he can't come in until he gets everything squared away. Pt states he will call back and make an appt. Pt states that he does have all meds filled at home and states he does understand how to take. Pt was informed to call the office if he needed anything and when he is ready to make appt.

## 2016-03-02 NOTE — Telephone Encounter (Signed)
Call from pt's daughter requesting to cancel 11/20 appts. He was just released from hospital and has not yet started PT. Having difficulty transferring. Dr. Benay Spice made aware: OK to move appts out to 11/24.

## 2016-03-02 NOTE — Telephone Encounter (Signed)
sw pt to confirm r/s appt to 11/28 per LOS

## 2016-03-05 ENCOUNTER — Encounter: Payer: BLUE CROSS/BLUE SHIELD | Admitting: Nutrition

## 2016-03-05 ENCOUNTER — Ambulatory Visit: Payer: BLUE CROSS/BLUE SHIELD | Admitting: Nurse Practitioner

## 2016-03-05 ENCOUNTER — Other Ambulatory Visit: Payer: BLUE CROSS/BLUE SHIELD

## 2016-03-05 ENCOUNTER — Ambulatory Visit: Payer: BLUE CROSS/BLUE SHIELD

## 2016-03-05 NOTE — Telephone Encounter (Signed)
Called and gave verbal ok per Dr. Sharol Given for eval but any further treatment would have to go through pcp or plaastic referral

## 2016-03-05 NOTE — Telephone Encounter (Signed)
Pt called and informed.

## 2016-03-06 ENCOUNTER — Telehealth (INDEPENDENT_AMBULATORY_CARE_PROVIDER_SITE_OTHER): Payer: Self-pay | Admitting: Radiology

## 2016-03-06 NOTE — Telephone Encounter (Signed)
I called and sw Holly and she states that there is not a sacral ulcer but the family is just covering a healed ulcer with allevyn foam. She states that the pt still has on his wound vac and this has been on since surgery 02/23/16 advised per Dr. Sharol Given to remove this and apply a dry dressing change daily and have the pt to call and make an appt to  Come into the office next week.

## 2016-03-06 NOTE — Telephone Encounter (Signed)
[facility] called and said that she was called to eval a sacral wound for pt, and he does not even have a sacral wound. However, he does still have the [place] wound vac on from the 02/23/16 surgery.  She needs to know if this needs attention?  Any orders for wound vac?  Also do you know who his cancer doctor is?  Please call her to advise. Thanks.

## 2016-03-12 ENCOUNTER — Telehealth: Payer: Self-pay | Admitting: Medical

## 2016-03-12 NOTE — Telephone Encounter (Signed)
Please call patient and first let him know that I am praying for him, and I know he has been through a lot.  I received short term disability papers to complete.  I need to know what dates this applies to.   Starting from date and end date at which time long term disability would kick in.

## 2016-03-12 NOTE — Telephone Encounter (Signed)
Called pt and gave him Shane's message. Pt was not sure what dates should be involved for short term disability and what date long term disability kicks in so pt is going to contact someone to get this info and call us back.

## 2016-03-13 ENCOUNTER — Encounter: Payer: BLUE CROSS/BLUE SHIELD | Admitting: Nutrition

## 2016-03-13 ENCOUNTER — Other Ambulatory Visit: Payer: BLUE CROSS/BLUE SHIELD

## 2016-03-13 ENCOUNTER — Ambulatory Visit: Payer: BLUE CROSS/BLUE SHIELD | Admitting: Nurse Practitioner

## 2016-03-13 ENCOUNTER — Telehealth: Payer: Self-pay

## 2016-03-13 ENCOUNTER — Ambulatory Visit: Payer: BLUE CROSS/BLUE SHIELD

## 2016-03-13 ENCOUNTER — Encounter: Payer: Self-pay | Admitting: *Deleted

## 2016-03-13 ENCOUNTER — Telehealth: Payer: Self-pay | Admitting: Oncology

## 2016-03-13 ENCOUNTER — Encounter: Payer: Self-pay | Admitting: Nutrition

## 2016-03-13 NOTE — Progress Notes (Signed)
Nutrition follow-up could not be completed because patient did not come to chemotherapy today.

## 2016-03-13 NOTE — Progress Notes (Unsigned)
Arvid Right NP notified that pt needs to re-schedule for next week.  Message sent to schedulers to re-schedule patient.

## 2016-03-13 NOTE — Telephone Encounter (Signed)
I called patient at home to make sure he was aware of his appointments for treatment and for MD today.  He stated he thought someone had cancelled them for him "I need at least a week, I have too much going on right now".  Left a message for Lorriane Shire RN so she is aware he is not coming and needs to be rescheduled. Pt reports feeling ok.

## 2016-03-13 NOTE — Telephone Encounter (Signed)
sw pt to confirm r/s appt to Dec 5 per LOS

## 2016-03-18 ENCOUNTER — Other Ambulatory Visit: Payer: Self-pay | Admitting: Oncology

## 2016-03-20 ENCOUNTER — Encounter: Payer: BLUE CROSS/BLUE SHIELD | Admitting: Nutrition

## 2016-03-20 ENCOUNTER — Telehealth: Payer: Self-pay | Admitting: Nurse Practitioner

## 2016-03-20 ENCOUNTER — Ambulatory Visit: Payer: BLUE CROSS/BLUE SHIELD | Admitting: Nurse Practitioner

## 2016-03-20 ENCOUNTER — Other Ambulatory Visit: Payer: BLUE CROSS/BLUE SHIELD

## 2016-03-20 ENCOUNTER — Ambulatory Visit: Payer: BLUE CROSS/BLUE SHIELD

## 2016-03-20 NOTE — Telephone Encounter (Signed)
Appointments rescheduled per patient request. Patient isn't feeling well.

## 2016-03-22 ENCOUNTER — Ambulatory Visit (INDEPENDENT_AMBULATORY_CARE_PROVIDER_SITE_OTHER): Payer: BLUE CROSS/BLUE SHIELD | Admitting: Family

## 2016-03-22 ENCOUNTER — Encounter (INDEPENDENT_AMBULATORY_CARE_PROVIDER_SITE_OTHER): Payer: Self-pay | Admitting: Orthopedic Surgery

## 2016-03-22 VITALS — Ht 71.0 in | Wt 174.0 lb

## 2016-03-22 DIAGNOSIS — Z89512 Acquired absence of left leg below knee: Secondary | ICD-10-CM

## 2016-03-22 NOTE — Progress Notes (Signed)
Office Visit Note   Patient: Donald Steele           Date of Birth: 11/28/1960           MRN: [id number] Visit Date: 03/22/2016              Requested by: Carlena Hurl, PA-C [place], [place] [address] PCP: Crisoforo Oxford, PA-C   Assessment & Plan: Visit Diagnoses:  1. History of left below knee amputation ([place])     Plan: Have provided an order for prosthesis for left BKA to [employer]. Patient to continue with dry dressings and Ace wrap until has obtained shrinker. Will wear shrinker daily once obtaining. Follow-up in the office with [place] in 4 more weeks. Staples were harvested today without incident.  Follow-Up Instructions: Return in about 4 weeks (around 04/19/2016).   Orders:  No orders of the defined types were placed in this encounter.  No orders of the defined types were placed in this encounter.     Procedures: No procedures performed   Clinical Data: No additional findings.   Subjective: Chief Complaint  Patient presents with  . Left Leg - Routine Post Op    02/23/16 left below the knee amputation    Patient is a 55 year old gentleman who is 1 month status post a left below the knee amputation. He is at home with [facility] physical therapy 2-3 times a week. Staples are intact and incision is well approximated. Staples are removed. Pt is not wearing a shrinker and he states he does not have one. No concerns today.    Review of Systems  Constitutional: Negative for chills and fever.     Objective: Vital Signs: Ht 5\' 11"  (1.803 m)   Wt 174 lb (78.9 kg)   BMI 24.27 kg/m   Physical Exam  Constitutional: He is oriented to person, place, and time. He appears well-developed and well-nourished.  Pulmonary/Chest: Effort normal.  Musculoskeletal:  Left residual limb is well consolidated. Incision has nearly healed. There is no drainage no maceration no surrounding erythema no sign of infection.  Neurological: He is alert and oriented to  person, place, and time.  Psychiatric: He has a normal mood and affect.  Nursing note reviewed.   Ortho Exam  Specialty Comments:  No specialty comments available.  Imaging: No results found.   PMFS History: Patient Active Problem List   Diagnosis Date Noted  . Protein-calorie malnutrition, severe 02/23/2016  . Osteomyelitis of ankle and foot ([place]) 02/21/2016  . Diabetic foot ulcer ([place]) 02/21/2016  . Port catheter in place 01/09/2016  . Cancer of head of pancreas ([place]) 12/16/2015  . Adenocarcinoma ([place]) 12/14/2015  . Liver enzyme elevation 12/14/2015  . Pancreatic insufficiency 12/09/2015  . 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 Obstructive jaundice due to malignant neoplasm ([place]) 12/01/2015  . Pancreatic mass 12/01/2015  . Obstructive jaundice 12/01/2015  . Former smoker 11/15/2015  . Vaccine counseling 11/15/2015  . Tooth decay 11/15/2015  . Ischemic cardiomyopathy 07/14/2015  . CAD (coronary artery disease) 07/14/2015  . Hypertensive heart disease 07/14/2015  . HLD (hyperlipidemia) 07/14/2015  . Subclavian artery stenosis, left ([place]) 07/07/2015  . Abnormal stress test   . Ischemic finger 06/28/2015  . Chronic combined systolic and diastolic CHF (congestive heart failure) ([place])   . 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, unspecified extremity ([place]) 06/01/2015  . Preop cardiovascular exam 06/01/2015  . Carotid bruit 06/01/2015  . Nonhealing ulcer of right lower extremity ([place])   .  Type 2 diabetes mellitus with diabetic peripheral angiopathy and gangrene, with long-term current use of insulin ([place]) 05/31/2015  . Femoral bruit 05/31/2015  . Skin breakdown 05/31/2015  . Leg ulcer, left ([place]) 05/31/2015  . Type 2 diabetes mellitus with diabetic neuropathy, without long-term current use of insulin ([place]) 05/31/2015  . Right foot pain 05/31/2015  . PAD (peripheral artery disease) ([place]) 05/31/2015   Past Medical History:  Diagnosis Date  . Cancer  (HCC)    adenocarcinoma of pancreas-Sept.2017  . Cardiomyopathy ([place])    a. Echo 2/17 - EF 35-40%, mod diff HK, inf-septal, inf-lat AK, Gr 1 DD, mild MR  . Carotid stenosis    a. Carotid US 2/17 - bilat ICA 1-39%  . Chronic combined systolic and diastolic CHF (congestive heart failure) ([place])   . Coronary artery disease   . Diabetes mellitus   . Duodenal stenosis   . Generalized weakness   . History of nuclear stress test    a. Nuc study 2/17 - Myocardial perfusion is abnormal. Findings consistent with prior myocardial infarction (inf, inf-lat, apical inf, apical lateral) with peri-infarct ischemia in apical inferolateral and apical lateral regions. This is a high risk study. Overall left ventricular systolic function was abnormal. LV cavity size is severely enlarged. Nuclear stress EF: 29%.   . Hyperlipidemia   . Hypertension   . Peripheral vascular disease due to secondary diabetes mellitus ([place])    a. s/p R Fem-Tibial bypass 2/17    Family History  Problem Relation Age of Onset  . Heart attack Neg Hx     Past Surgical History:  Procedure Laterality Date  . AMPUTATION Left 02/23/2016   Procedure: LEFT BELOW KNEE AMPUTATION;  Surgeon: Newt Minion, MD;  Location: [facility];  Service: Orthopedics;  Laterality: Left;  . CARDIAC CATHETERIZATION N/A 06/30/2015   Procedure: Left Heart Cath and Coronary Angiography;  Surgeon: Jettie Booze, MD;  Location: [facility];  Service: Cardiovascular;  Laterality: N/A;  . DUODENAL STENT PLACEMENT N/A 12/06/2015   Procedure: DUODENAL STENT PLACEMENT;  Surgeon: Carol Ada, MD;  Location: [facility];  Service: Endoscopy;  Laterality: N/A;  . ENDARTERECTOMY FEMORAL Right 06/02/2015   Procedure: ENDARTERECTOMY RIGHT PROFUNDA FEMORIS ARTERY;  Surgeon: Mal Misty, MD;  Location: [facility];  Service: Vascular;  Laterality: Right;  . ERCP N/A 12/02/2015   Procedure: ENDOSCOPIC RETROGRADE CHOLANGIOPANCREATOGRAPHY (ERCP);  Surgeon: Carol Ada, MD;   Location: [facility];  Service: Endoscopy;  Laterality: N/A;  . ESOPHAGOGASTRODUODENOSCOPY (EGD) WITH PROPOFOL N/A 12/06/2015   Procedure: ESOPHAGOGASTRODUODENOSCOPY (EGD) WITH PROPOFOL;  Surgeon: Carol Ada, MD;  [facility]: [facility];  Service: Endoscopy;  Laterality: N/A;  . EUS N/A 12/06/2015   Procedure: UPPER ENDOSCOPIC ULTRASOUND (EUS) LINEAR;  Surgeon: Carol Ada, MD;  [facility];  Service: Endoscopy;  Laterality: N/A;  . FEMORAL-TIBIAL BYPASS GRAFT Right 06/02/2015   Procedure: RIGHT PROFUNDA FEMORIS TO POSTERIOR TIBIAL ARTERY BYPASS USING RIGHT NON-REVERSED GREATER SAPHENOUS VEIN,  SUBCUTANEOUS TUNNEL;  Surgeon: Mal Misty, MD;  Location: [facility];  Service: Vascular;  Laterality: Right;  . INTRAOPERATIVE ARTERIOGRAM Right 06/02/2015   Procedure: INTRA OPERATIVE ARTERIOGRAM RIGHT LOWER LEG;  Surgeon: Mal Misty, MD;  Location: [facility];  Service: Vascular;  Laterality: Right;  . IR GENERIC HISTORICAL  12/03/2015   IR INT EXT BILIARY DRAIN WITH CHOLANGIOGRAM 12/03/2015 [name], MD [facility]  . IR GENERIC HISTORICAL  12/22/2015   IR BILIARY STENT(S) EXISTING ACCESS INC DILATION CATH EXCHANGE 12/22/2015 York Cerise  Earleen Newport, DO [facility]  . MULTIPLE TOOTH EXTRACTIONS    . PERIPHERAL VASCULAR CATHETERIZATION N/A 06/01/2015   Procedure: Abdominal Aortogram;  Surgeon: Serafina Mitchell, MD;  Location: [facility];  Service: Cardiovascular;  Laterality: N/A;  . PERIPHERAL VASCULAR CATHETERIZATION N/A 07/07/2015   Procedure: Aortic Arch Angiography;  Surgeon: Conrad Alsip, MD;  Location: [facility];  Service: Cardiovascular;  Laterality: N/A;  . PERIPHERAL VASCULAR CATHETERIZATION N/A 07/07/2015   Procedure: Upper Extremity Angiography;  Surgeon: Conrad Concord, MD;  Location: [facility];  Service: Cardiovascular;  Laterality: N/A;  . PERIPHERAL VASCULAR CATHETERIZATION Left 07/07/2015   Procedure: Peripheral Vascular Intervention;  Surgeon: Conrad Daggett, MD;   Location: [facility];  Service: Cardiovascular;  Laterality: Left;  left subclavian and axillary  . PORTACATH PLACEMENT N/A 12/29/2015   Procedure: INSERTION PORT-A-CATH;  Surgeon: [name], MD;  Location: [facility];  Service: General;  Laterality: N/A;  . VEIN HARVEST Right 06/02/2015   Procedure: VEIN HARVEST RIGHT GREATER SAPHENOUS VEIN;  Surgeon: Mal Misty, MD;  Location: [facility];  Service: Vascular;  Laterality: Right;   Social History   Occupational History  . Not on file.   Social History Main Topics  . Smoking status: Former Smoker    Packs/day: 1.00    Years: 35.00    Quit date: 05/18/2015  . Smokeless tobacco: Never Used  . Alcohol use No  . Drug use: No  . Sexual activity: Not on file

## 2016-03-26 ENCOUNTER — Other Ambulatory Visit: Payer: BLUE CROSS/BLUE SHIELD

## 2016-03-26 ENCOUNTER — Encounter: Payer: BLUE CROSS/BLUE SHIELD | Admitting: Nutrition

## 2016-03-26 ENCOUNTER — Other Ambulatory Visit: Payer: Self-pay | Admitting: Nurse Practitioner

## 2016-03-26 ENCOUNTER — Telehealth: Payer: Self-pay

## 2016-03-26 ENCOUNTER — Ambulatory Visit: Payer: BLUE CROSS/BLUE SHIELD | Admitting: Nurse Practitioner

## 2016-03-26 NOTE — Telephone Encounter (Signed)
Pt unable to come in today or this week d/t transportation issues. Pt requested to come in next week. Will check with Marlynn Perking and Dr. Benay Spice for reschedule date.

## 2016-03-27 ENCOUNTER — Ambulatory Visit (INDEPENDENT_AMBULATORY_CARE_PROVIDER_SITE_OTHER): Payer: BLUE CROSS/BLUE SHIELD | Admitting: Medical

## 2016-03-27 ENCOUNTER — Encounter: Payer: Self-pay | Admitting: Medical

## 2016-03-27 VITALS — BP 108/62 | HR 68 | Wt 130.0 lb

## 2016-03-27 DIAGNOSIS — I739 Peripheral vascular disease, unspecified: Secondary | ICD-10-CM | POA: Diagnosis not present

## 2016-03-27 DIAGNOSIS — C259 Malignant neoplasm of pancreas, unspecified: Secondary | ICD-10-CM | POA: Diagnosis not present

## 2016-03-27 DIAGNOSIS — Z794 Long term (current) use of insulin: Secondary | ICD-10-CM

## 2016-03-27 DIAGNOSIS — E43 Unspecified severe protein-calorie malnutrition: Secondary | ICD-10-CM | POA: Diagnosis not present

## 2016-03-27 DIAGNOSIS — E1152 Type 2 diabetes mellitus with diabetic peripheral angiopathy with gangrene: Secondary | ICD-10-CM

## 2016-03-27 DIAGNOSIS — Z899 Acquired absence of limb, unspecified: Secondary | ICD-10-CM | POA: Diagnosis not present

## 2016-03-27 DIAGNOSIS — E639 Nutritional deficiency, unspecified: Secondary | ICD-10-CM | POA: Insufficient documentation

## 2016-03-27 DIAGNOSIS — I70229 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, unspecified extremity: Secondary | ICD-10-CM | POA: Diagnosis not present

## 2016-03-27 LAB — CBC
HEMATOCRIT: 30.4 % — AB (ref 38.5–50.0)
Hemoglobin: 9.1 g/dL — ABNORMAL LOW (ref 13.2–17.1)
MCH: 24.1 pg — ABNORMAL LOW (ref 27.0–33.0)
MCHC: 29.9 g/dL — ABNORMAL LOW (ref 32.0–36.0)
MCV: 80.4 fL (ref 80.0–100.0)
MPV: 10.2 fL (ref 7.5–12.5)
PLATELETS: 360 10*3/uL (ref 140–400)
RBC: 3.78 MIL/uL — ABNORMAL LOW (ref 4.20–5.80)
RDW: 17.3 % — AB (ref 11.0–15.0)
WBC: 7.9 10*3/uL (ref 4.0–10.5)

## 2016-03-27 MED ORDER — INSULIN LISPRO 100 UNIT/ML (KWIKPEN): 10.0000 [IU] | PEN_INJECTOR | Freq: Three times a day (TID) | SUBCUTANEOUS | 11 refills | Status: DC

## 2016-03-27 MED ORDER — ASPIRIN 81 MG PO TBEC: 81.0000 mg | DELAYED_RELEASE_TABLET | Freq: Every day | ORAL | 3 refills | Status: AC

## 2016-03-27 MED ORDER — HYDROCODONE-ACETAMINOPHEN 7.5-325 MG PO TABS: 1.0000 | ORAL_TABLET | Freq: Two times a day (BID) | ORAL | 0 refills | Status: AC

## 2016-03-27 MED ORDER — ATORVASTATIN CALCIUM 40 MG PO TABS: 40.0000 mg | ORAL_TABLET | Freq: Every day | ORAL | 3 refills | Status: AC

## 2016-03-27 MED ORDER — ENSURE ENLIVE PO LIQD: 237.0000 mL | Freq: Two times a day (BID) | ORAL | 3 refills | Status: AC

## 2016-03-27 MED ORDER — MICROLET LANCETS MISC: 5 refills | Status: AC

## 2016-03-27 MED ORDER — "NEEDLE (DISP) 30G X 1/2"" MISC": 1.0000 | Freq: Every day | 5 refills | Status: AC

## 2016-03-27 NOTE — Progress Notes (Signed)
Subjective: Chief Complaint  Patient presents with  . hospital follow up    hospital x1 week    Here today accompanied by his sister Donald Steele.  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    He was hospitalized 02/21/16 - 02/29/16 for osteomyelitis of ankle and foot, diabetes, PAD, CHF, pancreatic cancer, diabetic foot ulcer, protein-calori malnutrition severe.   He lives with his sister and mother who help take care of him.  He notes since the hospital discharge he has felt very fatigue, no energy, no motivations, somewhat down in mood, no appetite, ongoing abdominal pain including with eating which in part keeps him from eating.   He ran out of some of his medications, didn't get around to pharmacy to get refills, and he is confused about his medications, particular the insulin.    He saw orthopedics the other day for f/u on wound, prosthesis planning.    He hasn't continued with chemotherapy of late given his lack of energy, trying to process all that is going on with him and his life including the murder of his girlfriend several weeks ago.  He is not attending church or support groups at the cancer center.  Home health just finished their time coming out.  Was getting home health nurse coming out and physical therapy  Diet - eating minimal food.  He reports yesterday drinking 2 ensures.  The day before ate few bites of egg sandwich.  He notes barely eating a whole meal in a given day.   Eating causes abdominal pain.    He has been "constipated" maybe having 1 BM per week. Checking glucose sometimes, getting 350s on average.    Hasn't been working in [place] due to his health concerns. We completed some short term disability paperwork recently.   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   Regarding mood, he and  sister report that he is not necessarily depressed, mood is down some, but seems overwhelmed in general, irritated when family comments on his lack of eating.  His mother has her own health concerns but is helping to take care of him.   Past Medical History:  Diagnosis Date  . 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 Cardiomyopathy ([place])    a. Echo 2/17 - EF 35-40%, mod diff HK, inf-septal, inf-lat AK, Gr 1 DD, mild MR  . Carotid stenosis    a. Carotid US 2/17 - bilat ICA 1-39%  . Chronic combined systolic and diastolic CHF (congestive heart failure) ([place])   . Coronary artery disease   . Diabetes mellitus   . Duodenal stenosis   . Generalized weakness   . History of nuclear stress test    a. Nuc study 2/17 - Myocardial perfusion is abnormal. Findings consistent with prior myocardial infarction (inf, inf-lat, apical inf, apical lateral) with peri-infarct ischemia in apical inferolateral and apical lateral regions. This is a high risk study. Overall left ventricular systolic function was abnormal. LV cavity size is severely enlarged. Nuclear stress EF: 29%.   . Hyperlipidemia   . Hypertension   . Peripheral vascular disease due to secondary diabetes mellitus ([place])    a. s/p R Fem-Tibial bypass 2/17    Current Outpatient Prescriptions on File Prior to Visit  Medication Sig Dispense Refill  . Blood Glucose Monitoring Suppl (ONETOUCH VERIO) w/Device KIT 1 kit by Does not apply  route daily. 1 kit 0  . glucose blood (ONETOUCH VERIO) test strip Test twice a day 100 each 1  . lidocaine-prilocaine (EMLA) cream Apply 1 application topically as needed. Apply to port site 1 hr prior to port access and cover with plastic wrap 30 g 0  . morphine (MS CONTIN) 15 MG 12 hr tablet Take 1 tablet (15 mg total) by mouth every 12 (twelve) hours. (Patient not taking: Reported on 03/27/2016) 60 tablet 0   Current Facility-Administered Medications on File Prior to Visit  Medication Dose Route  Frequency Provider Last Rate Last Dose  . sodium chloride flush (NS) 0.9 % injection 10 mL  10 mL Intravenous PRN Ladell Pier, MD   10 mL at 01/02/16 1550  . sodium chloride flush (NS) 0.9 % injection 10 mL  10 mL Intracatheter PRN Ladell Pier, MD   10 mL at 01/09/16 1559   Past Surgical History:  Procedure Laterality Date  . AMPUTATION Left 02/23/2016   Procedure: LEFT BELOW KNEE AMPUTATION;  Surgeon: Newt Minion, MD;  Location: [place];  Service: Orthopedics;  Laterality: Left;  . CARDIAC CATHETERIZATION N/A 06/30/2015   Procedure: Left Heart Cath and Coronary Angiography;  Surgeon: Jettie Booze, MD;  Location: [facility];  Service: Cardiovascular;  Laterality: N/A;  . DUODENAL STENT PLACEMENT N/A 12/06/2015   Procedure: DUODENAL STENT PLACEMENT;  Surgeon: Carol Ada, MD;  Location: [facility];  Service: Endoscopy;  Laterality: N/A;  . ENDARTERECTOMY FEMORAL Right 06/02/2015   Procedure: ENDARTERECTOMY RIGHT PROFUNDA FEMORIS ARTERY;  Surgeon: Mal Misty, MD;  Location: [place];  Service: Vascular;  Laterality: Right;  . ERCP N/A 12/02/2015   Procedure: ENDOSCOPIC RETROGRADE CHOLANGIOPANCREATOGRAPHY (ERCP);  Surgeon: Carol Ada, MD;  Location: [facility];  Service: Endoscopy;  Laterality: N/A;  . ESOPHAGOGASTRODUODENOSCOPY (EGD) WITH PROPOFOL N/A 12/06/2015   Procedure: ESOPHAGOGASTRODUODENOSCOPY (EGD) WITH PROPOFOL;  Surgeon: Carol Ada, MD;  [facility];  Service: Endoscopy;  Laterality: N/A;  . EUS N/A 12/06/2015   Procedure: UPPER ENDOSCOPIC ULTRASOUND (EUS) LINEAR;  Surgeon: Carol Ada, MD;  [facility];  Service: Endoscopy;  Laterality: N/A;  . FEMORAL-TIBIAL BYPASS GRAFT Right 06/02/2015   Procedure: RIGHT PROFUNDA FEMORIS TO POSTERIOR TIBIAL ARTERY BYPASS USING RIGHT NON-REVERSED GREATER SAPHENOUS VEIN,  SUBCUTANEOUS TUNNEL;  Surgeon: Mal Misty, MD;  Location: [place];  Service: Vascular;  Laterality: Right;  . INTRAOPERATIVE  ARTERIOGRAM Right 06/02/2015   Procedure: INTRA OPERATIVE ARTERIOGRAM RIGHT LOWER LEG;  Surgeon: Mal Misty, MD;  Location: [place] [facility];  Service: Vascular;  Laterality: Right;  . IR GENERIC HISTORICAL  12/03/2015   IR INT EXT BILIARY DRAIN WITH CHOLANGIOGRAM 12/03/2015 [name], MD [facility]  . IR GENERIC HISTORICAL  12/22/2015   IR BILIARY STENT(S) EXISTING ACCESS INC DILATION CATH EXCHANGE 12/22/2015 Corrie Mckusick, DO [facility]  . MULTIPLE TOOTH EXTRACTIONS    . PERIPHERAL VASCULAR CATHETERIZATION N/A 06/01/2015   Procedure: Abdominal Aortogram;  Surgeon: Serafina Mitchell, MD;  Location: [facility];  Service: Cardiovascular;  Laterality: N/A;  . PERIPHERAL VASCULAR CATHETERIZATION N/A 07/07/2015   Procedure: Aortic Arch Angiography;  Surgeon: Conrad Hendrum, MD;  Location: [facility];  Service: Cardiovascular;  Laterality: N/A;  . PERIPHERAL VASCULAR CATHETERIZATION N/A 07/07/2015   Procedure: Upper Extremity Angiography;  Surgeon: Conrad Kennard, MD;  Location: [facility];  Service: Cardiovascular;  Laterality: N/A;  . PERIPHERAL VASCULAR CATHETERIZATION Left 07/07/2015   Procedure: Peripheral Vascular  Intervention;  Surgeon: Conrad Konawa, MD;  Location: [facility];  Service: Cardiovascular;  Laterality: Left;  left subclavian and axillary  . PORTACATH PLACEMENT N/A 12/29/2015   Procedure: INSERTION PORT-A-CATH;  Surgeon: [name], MD;  Location: [facility];  Service: General;  Laterality: N/A;  . VEIN HARVEST Right 06/02/2015   Procedure: VEIN HARVEST RIGHT GREATER SAPHENOUS VEIN;  Surgeon: Mal Misty, MD;  Location: [facility] [place];  Service: Vascular;  Laterality: Right;   ROS as in subjective   Objective: BP 108/62   Pulse 68   Wt 130 lb (59 kg)   SpO2 98%   BMI 18.13 kg/m   Gen: malnourished, seated in chair, nad Skin: loose skin around abdomen from weight loss Lungs clear Heart rrr, normal s1, s2, no murmurs Abdomen: +bs soft, nontender, no obvious  organomegaly Ext: no edema Left lower leg s/p amputation    Assessment: Encounter Diagnoses  Name Primary?  . Malignant neoplasm of pancreas, unspecified location of malignancy ([place]) Yes  . S/P amputation   . 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 PAD (peripheral artery disease) ([place])   . Type 2 diabetes mellitus with diabetic peripheral angiopathy and gangrene, with long-term current use of insulin ([place])   . Protein-calorie malnutrition, severe ([place])     Plan Major concerns today include helping with nutrition, helping with his abdominal pain, getting him back in touch with oncology for f/u, getting back on track with his insulin and glucose control.  He has f/u with ortho regarding prosthesis for s/p amputation left lower leg.   Protein calorie malnutrition - will touch base with oncology regarding megace, possible feeding tube, restarting chemotherapy, and getting him back on track with cancer therapy.  Consider hospice.  Pain control - begin hydrocodone BID, f/u with oncology or me within a month regarding pain.  Of note OxyContin was given in recent weeks but he didn't like the way it made him feel.  Discussed risks/benefits of hydrocodone.  F/u with ortho regarding amputation, prosthesis plan  Diabetes, hyperlipidemia, PAD, CHF  Begin back on Aspirin 15m daily at bedtime  Begin back on Atorvastatin/Lipitor nightly at bedtime for cholesterol  Begin back on Humalog flex pen meal time insulin.    Use the sliding scale below for insulin dosing  Monitor glucose readings  If your glucose before meal is 60 -100, don't use the meal time insulin for this meal If your glucose before meal is 101-150, use  1  units of Insulin  If your glucose before meal is 151-200, use  3  units of Insulin  If your glucose before meal is 201-250, use  5  units of Insulin If your glucose before meal is 251-300, use  7  units of Insulin If  your glucose before meal is 301-350, use  9  units of Insulin If your glucose before meal is 351-400, use  11  units of Insulin If your glucose before meal is 451-500, use  13  units of Insulin If your glucose before meal is >500, use 15 units of Insulin and call doctor immediately  Encouraged him to go to church with his family, request prayer and support from his church family, and consider counseling/support group through oncology  LTitanwas seen today for hospital follow up.  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 -     MICROLET LANCETS MISC; Test twice a day. Pt  Uses one touch verio meter -     feeding supplement, ENSURE ENLIVE, (ENSURE ENLIVE) LIQD; Take 237 mLs by mouth 2 (two) times daily between meals. -     atorvastatin (LIPITOR) 40 MG tablet; Take 1 tablet (40 mg total) by mouth daily. -     NEEDLE, DISP, 30 G (BD DISP NEEDLES) 30G X 1/2" MISC; 1 each by Does not apply route at bedtime. -     aspirin 81 MG EC tablet; Take 1 tablet (81 mg total) by mouth daily. Swallow whole. -     HYDROcodone-acetaminophen (NORCO) 7.5-325 MG tablet; Take 1 tablet by mouth 2 (two) times daily.   Spent > 45 minutes face to face with patient in discussion of symptoms, evaluation, plan and recommendations.

## 2016-03-27 NOTE — Telephone Encounter (Signed)
Called pt to inform him of new appt time. Spoke with patients mother and informed her pt will have CT done next week, someone will call her with that appt and then pt will follow up at our office jan 2nd at 1145. Donald Steele verbalized understanding and denies any questions or concerns at this time.

## 2016-03-27 NOTE — Patient Instructions (Signed)
Begin back on Aspirin 81mg  daily at bedtime  Begin back on Atorvastatin/Lipitor nightly at bedtime for cholesterol  Begin back on Humalog flex pen meal time insulin.    Use the sliding scale below for insulin dosing  Correction Insulin/Sliding Scale Your caregiver has decided you need insulin at home. You have been given a correctional scale (sliding scale) in case you need extra insulin when your blood sugar is too high (hyperglycemia). The following instructions will assist you in how to use that correctional scale.  WHAT IS A CORRECTIONAL SCALE (SLIDING SCALE)?  When you check your blood sugar, sometimes it will be higher than your caregiver wants it to be. You may need an extra dose of insulin to bring your blood sugar to your desired level (also known as your goal, target level, or normal level.) The correctional scale is prescribed by your caregiver based on your specific needs.   ______________________________________________________________________  INSULIN SLIDING SCALE   Use the chart below to determine the amount of your Humalog Insulin that you will use to control your meal time blood sugar.  If your glucose before meal is less than 60, drink 4 oz of orange juice or if able, eat a piece of candy and do not use the meal time dose of insulin  If your glucose before meal is 60 -100, don't use the meal time insulin for this meal If your glucose before meal is 101-150, use  1  units of Insulin  If your glucose before meal is 151-200, use  3  units of Insulin  If your glucose before meal is 201-250, use  5  units of Insulin If your glucose before meal is 251-300, use  7  units of Insulin If your glucose before meal is 301-350, use  9  units of Insulin If your glucose before meal is 351-400, use  11  units of Insulin If your glucose before meal is 451-500, use  13  units of Insulin If your glucose before meal is >500, use 15 units of Insulin and call doctor  immediately  ________________________________________________________________________    WHY IS IT IMPORTANT TO KEEP YOUR BLOOD SUGAR LEVELS AT YOUR DESIRED LEVEL?  It helps to prevent long-term complications of diabetes, such as eye disease, kidney failure, and other serious complications. WHAT TYPE OF INSULIN WILL YOU USE?  To help bring down blood sugars that are too high, your caregiver has prescribed a short-acting or a rapid-acting insulin. An example of a short-acting insulin would be Regular.  WHAT DO I NEED TO DO?   Check your blood sugar with your home blood glucose meter as recommended by your caregiver.  Using your correctional scale, find the range your blood sugar lies in.  Look for the units of insulin that matches the blood sugar range. Give yourself the dose of correctional insulin your caregiver has prescribed. Always make sure you are using the right type of insulin.  Prior to the injection make sure you have food available that you can eat in the next 15 to 30 minutes.  If your correctional insulin is rapid acting, start eating your meal within 15 minutes after you have given yourself the insulin injection. If you wait longer than 15 minutes to eat, your blood sugar might get too low.  If your correctional insulin is short acting (Regular), start eating your meal within 30 minutes after you have given yourself the insulin injection. If you wait longer than 30 minutes to eat, your blood  sugar might get too low. Symptoms of low blood sugar (hypoglycemia) may include feeling shaky or weak, sweating a lot, not thinking straight, difficulty seeing, agitation, or crankiness. Check your blood sugar immediately and treat your results as directed by your caregiver.  Keep a log of your blood sugar results with the time you took the test and the amount of insulin that you injected. This information will help your caregiver manage your medications.  Note on your log anything  that may affect your blood sugars such as:  Changes in normal exercise or activity.  Changes in your normal schedule, such as staying up late, going on vacation, changing your diet, or holidays.  New medications. This includes all medications. Some medications, even those that do not require a prescription, may cause high blood sugars.  Illness or stress.  Changes in when you actually took your medication.  Changes in your meals, such as skipping a meal, a late meal, or dining out.  Eating things that may affect blood glucose, such as snacks, larger meal portions than normal, or drinks with sugar.  Ask your caregiver any questions you have.

## 2016-03-28 ENCOUNTER — Other Ambulatory Visit: Payer: Self-pay | Admitting: Medical

## 2016-03-28 ENCOUNTER — Telehealth: Payer: Self-pay

## 2016-03-28 LAB — COMPREHENSIVE METABOLIC PANEL
ALBUMIN: 3.6 g/dL (ref 3.6–5.1)
ALT: 17 U/L (ref 9–46)
AST: 16 U/L (ref 10–35)
Alkaline Phosphatase: 167 U/L — ABNORMAL HIGH (ref 40–115)
BUN: 19 mg/dL (ref 7–25)
CHLORIDE: 99 mmol/L (ref 98–110)
CO2: 24 mmol/L (ref 20–31)
CREATININE: 0.91 mg/dL (ref 0.70–1.33)
Calcium: 9.5 mg/dL (ref 8.6–10.3)
Glucose, Bld: 373 mg/dL — ABNORMAL HIGH (ref 65–99)
Potassium: 5.1 mmol/L (ref 3.5–5.3)
SODIUM: 132 mmol/L — AB (ref 135–146)
TOTAL PROTEIN: 6.9 g/dL (ref 6.1–8.1)
Total Bilirubin: 0.4 mg/dL (ref 0.2–1.2)

## 2016-03-28 MED ORDER — INSULIN ASPART 100 UNIT/ML FLEXPEN: 10.0000 [IU] | PEN_INJECTOR | Freq: Three times a day (TID) | SUBCUTANEOUS | 11 refills | Status: AC

## 2016-03-28 NOTE — Telephone Encounter (Signed)
I sent Novolog instead.   If this is too expensive call back ASAP or have them demand pharmacy call us ASAP

## 2016-03-28 NOTE — Telephone Encounter (Signed)
Pt 's sister called and said that he can't get the novolog because he can't afford it.  Wants to know if he can have something else.

## 2016-03-28 NOTE — Telephone Encounter (Signed)
In-basket received from PCP, requesting pt be seen in our office this week. Per MD pt to be scheduled on 12/15. Called patient, pt aware to come in for 1130 appt. Scheduling message sent

## 2016-03-29 NOTE — Telephone Encounter (Signed)
Called and spoke his sister April to let her know about this

## 2016-03-30 ENCOUNTER — Ambulatory Visit (HOSPITAL_BASED_OUTPATIENT_CLINIC_OR_DEPARTMENT_OTHER): Payer: BLUE CROSS/BLUE SHIELD | Admitting: Oncology

## 2016-03-30 ENCOUNTER — Telehealth: Payer: Self-pay | Admitting: Oncology

## 2016-03-30 ENCOUNTER — Encounter: Payer: Self-pay | Admitting: Medical

## 2016-03-30 VITALS — BP 134/40 | HR 102 | Temp 97.8°F | Resp 18 | Ht 71.0 in | Wt 130.0 lb

## 2016-03-30 DIAGNOSIS — E119 Type 2 diabetes mellitus without complications: Secondary | ICD-10-CM

## 2016-03-30 DIAGNOSIS — C250 Malignant neoplasm of head of pancreas: Secondary | ICD-10-CM

## 2016-03-30 MED ORDER — OXYCODONE-ACETAMINOPHEN 5-325 MG PO TABS: 1.0000 | ORAL_TABLET | Freq: Four times a day (QID) | ORAL | 0 refills | Status: DC | PRN

## 2016-03-30 NOTE — Progress Notes (Signed)
Notified by APP that pt's port had a needle with dressing dated 02/28/16. Assessed site, dressing was soiled and peeling. Antimicrobial patch intact. Pt reports he has not gotten it wet since going home from the hospital. Needle removed. Site unremarkable except for scabbed area around insertion site. Recommended waiting before re-accessing/ flushing port.

## 2016-03-30 NOTE — Progress Notes (Addendum)
Donald Steele OFFICE PROGRESS NOTE   Diagnosis:  Pancreas cancer  INTERVAL HISTORY:   Donald Steele returns for follow-up of pancreas cancer. He was last seen at the [facility] on 02/06/2016. He completed a third cycle of gemcitabine/Abraxane on 02/06/2016. He was admitted 02/21/2016 with osteomyelitis of the left lower extremity and underwent a trans-tibial amputation on 02/23/2016. He was discharged 02/29/2016.  He is working with physical therapy. He is up with a walker. Appetite is poor. He has lost a significant amount of weight. He reports good fluid intake. No nausea or vomiting. Main complaint is constipation. He reports last bowel movement was 2-3 weeks ago and was "hard and dry". He is taking 1 stool softener a day. He has crampy abdominal pain. He notes increased abdominal pain after eating. He has pain related to the recent surgery. He takes hydrocodone as needed with partial relief.  Objective:  Vital signs in last 24 hours:  Blood pressure (!) 134/40, pulse (!) 102, temperature 97.8 F (36.6 C), temperature source Oral, resp. rate 18, height 5\' 11"  (1.803 m), weight 130 lb (59 kg), SpO2 100 %.    HEENT: No thrush or ulcers. Resp: Lungs clear bilaterally. Cardio: Regular rate and rhythm. GI: Abdomen is soft. Mild tenderness at the upper abdomen. Nondistended. No hepatomegaly. No mass. Hypoactive bowel sounds. Vascular: No right leg edema. Left below the knee amputation. Port-A-Cath without erythema.  Lab Results:  Lab Results  Component Value Date   WBC 7.9 03/27/2016   HGB 9.1 (L) 03/27/2016   HCT 30.4 (L) 03/27/2016   MCV 80.4 03/27/2016   PLT 360 03/27/2016   NEUTROABS 22.1 (H) 02/21/2016    Imaging:  No results found.  Medications: I have reviewed the patient's current medications.  Assessment/Plan: 1. 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 Clinical stage IIa (T3 N0), FNA biopsy of the pancreas mass  and a duodenal ulcer on 12/06/2015 confirmed adenocarcinoma ? 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 Initiation of gemcitabine/Abraxane every 2 weeks beginning 01/09/2016 ? 02/06/2016 cycle 3 gemcitabine/Abraxane 2. Diabetes  3. Peripheral vascular disease, status post stenting of the left subclavian artery and peripheral bypass surgery  4. Cardiomyopathy  5. Leg and foot ulcer secondary to diabetes and peripheral vascular disease-followed at the wound clinic  6. Obstructive jaundice secondary to #1, status post placement of anexternal-internal biliary drain 12/03/2015, stent internalized 12/22/2015; bilirubin normal 01/09/2016  7. Anorexia/weight loss  8. Port-A-Cath placed 12/29/2015  9. Abdominal pain secondary to pancreas cancer  10.  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 He was subsequently admitted with osteomyelitis of the left leg and underwent transtibial amputation 02/23/2016. He has a poor performance status. He has lost a significant amount of weight.  Main complaint today is constipation. He will try magnesium citrate and a fleets enema. He will begin Miralax daily and increase the stool softener to twice daily. He will contact the office if this is not effective and we will try sorbitol.  For the left leg pain and abdominal pain he was given a prescription for Percocet.  We are referring him for restaging CT scans in approximately 2 weeks. He will return for a follow-up visit on 04/18/2015. He will contact the office in the interim as outlined above or with any other problems.  Patient seen with Dr. Benay Spice. 25 minutes were spent face-to-face at today's  visit with the majority of that time involved in counseling/coordination of care.    Donald Steele ANP/GNP-BC   03/30/2016  12:27 PM  This was a  shared visit with Donald Steele.  Donald Steele was interviewed and examined.  We adjusted the bowel regimen today.   He will undergo restaging CTs.  We will then decide on whether to resume gemcitabine/abraxane.  Julieanne Manson, MD

## 2016-03-30 NOTE — Telephone Encounter (Signed)
Gave patient avs report and appointments for December and January. Central radiology will call re ct.

## 2016-04-02 ENCOUNTER — Telehealth: Payer: Self-pay | Admitting: *Deleted

## 2016-04-02 NOTE — Telephone Encounter (Signed)
Oncology Nurse Navigator Documentation  Oncology Nurse Navigator Flowsheets 04/02/2016  Navigator Location [facility]  Referral date to RadOnc/MedOnc -  Navigator Encounter Type Telephone  Telephone Appt Confirmation/Clarification  Abnormal Finding Date -  Confirmed Diagnosis Date -  Patient Visit Type -  Treatment Phase -  Barriers/Navigation Needs Coordination of Care--CT scan  Education -  Interventions Coordination of Care-scheduled scan on 12/27  Coordination of Care Radiology--scheduled for 12:15/12:30 at [facility] on 12/27. Clear liquids only after 0830 and drink contrast at 1030 and 1130. Confirmed he has contrast at home for scan and he does want his port used for scan.  Education Method -  Support Groups/Services -  Acuity Level 2  Time Spent with Patient 15

## 2016-04-11 ENCOUNTER — Other Ambulatory Visit (HOSPITAL_BASED_OUTPATIENT_CLINIC_OR_DEPARTMENT_OTHER): Payer: BLUE CROSS/BLUE SHIELD

## 2016-04-11 ENCOUNTER — Ambulatory Visit: Payer: BLUE CROSS/BLUE SHIELD

## 2016-04-11 ENCOUNTER — Ambulatory Visit (HOSPITAL_COMMUNITY)
Admission: RE | Admit: 2016-04-11 | Discharge: 2016-04-11 | Disposition: A | Discharge status: Home / Self Care | Payer: BLUE CROSS/BLUE SHIELD | Source: Ambulatory Visit | Attending: Nurse Practitioner | Admitting: Nurse Practitioner

## 2016-04-11 ENCOUNTER — Telehealth: Payer: Self-pay

## 2016-04-11 ENCOUNTER — Encounter (HOSPITAL_COMMUNITY): Payer: Self-pay

## 2016-04-11 ENCOUNTER — Telehealth: Payer: Self-pay | Admitting: *Deleted

## 2016-04-11 DIAGNOSIS — C250 Malignant neoplasm of head of pancreas: Secondary | ICD-10-CM

## 2016-04-11 DIAGNOSIS — C787 Secondary malignant neoplasm of liver and intrahepatic bile duct: Secondary | ICD-10-CM | POA: Insufficient documentation

## 2016-04-11 DIAGNOSIS — Z95828 Presence of other vascular implants and grafts: Secondary | ICD-10-CM

## 2016-04-11 LAB — CBC WITH DIFFERENTIAL/PLATELET
BASO%: 0.7 % (ref 0.0–2.0)
BASOS ABS: 0.1 10*3/uL (ref 0.0–0.1)
EOS ABS: 0.3 10*3/uL (ref 0.0–0.5)
EOS%: 3.7 % (ref 0.0–7.0)
HCT: 27.5 % — ABNORMAL LOW (ref 38.4–49.9)
HGB: 8.7 g/dL — ABNORMAL LOW (ref 13.0–17.1)
LYMPH%: 32.2 % (ref 14.0–49.0)
MCH: 24.4 pg — AB (ref 27.2–33.4)
MCHC: 31.6 g/dL — AB (ref 32.0–36.0)
MCV: 77.2 fL — AB (ref 79.3–98.0)
MONO#: 1 10*3/uL — ABNORMAL HIGH (ref 0.1–0.9)
MONO%: 13.4 % (ref 0.0–14.0)
NEUT#: 3.6 10*3/uL (ref 1.5–6.5)
NEUT%: 50 % (ref 39.0–75.0)
Platelets: 308 10*3/uL (ref 140–400)
RBC: 3.56 10*6/uL — AB (ref 4.20–5.82)
RDW: 16 % — AB (ref 11.0–14.6)
WBC: 7.3 10*3/uL (ref 4.0–10.3)
lymph#: 2.3 10*3/uL (ref 0.9–3.3)

## 2016-04-11 LAB — COMPREHENSIVE METABOLIC PANEL
ALBUMIN: 2.8 g/dL — AB (ref 3.5–5.0)
ALK PHOS: 412 U/L — AB (ref 40–150)
ALT: 38 U/L (ref 0–55)
AST: 24 U/L (ref 5–34)
Anion Gap: 8 mEq/L (ref 3–11)
BUN: 15.5 mg/dL (ref 7.0–26.0)
CHLORIDE: 99 meq/L (ref 98–109)
CO2: 26 meq/L (ref 22–29)
Calcium: 9.4 mg/dL (ref 8.4–10.4)
Creatinine: 1 mg/dL (ref 0.7–1.3)
Glucose: 457 mg/dl — ABNORMAL HIGH (ref 70–140)
POTASSIUM: 4.6 meq/L (ref 3.5–5.1)
SODIUM: 133 meq/L — AB (ref 136–145)
Total Bilirubin: 0.41 mg/dL (ref 0.20–1.20)
Total Protein: 7.2 g/dL (ref 6.4–8.3)

## 2016-04-11 MED ORDER — IOPAMIDOL (ISOVUE-300) INJECTION 61%
INTRAVENOUS | Status: AC
  Administered 2016-04-11: 100 mL
  Filled 2016-04-11: qty 100

## 2016-04-11 MED ORDER — SODIUM CHLORIDE 0.9 % IJ SOLN
10.0000 mL | INTRAMUSCULAR | Status: DC | PRN
  Administered 2016-04-11: 10 mL via INTRAVENOUS
  Filled 2016-04-11: qty 10

## 2016-04-11 MED ORDER — HEPARIN SOD (PORK) LOCK FLUSH 100 UNIT/ML IV SOLN
INTRAVENOUS | Status: AC
  Filled 2016-04-11: qty 5

## 2016-04-11 NOTE — Telephone Encounter (Signed)
Call report from [place] with [facility] for today's CT Abd/Pelvis.  IMPRESSION: 1. Interval migration of 1 of the 2 metallic duodenal stents placed on 12/06/2015, now a right lower quadrant small bowel loop, potentially the terminal ileum. No evidence for small bowel obstruction at this time. 2. Interval development of innumerable hepatic metastases measuring up to 14 mm in size. 3. New 12 mm short axis lymph node identified gastrohepatic ligament, worrisome for metastatic disease. 4. Pancreatic head mass measures slightly larger on today's study with interval progression of main pancreatic ductal dilatation.   Electronically Signed   By: Misty Stanley M.D.   On: 04/11/2016 15:47  Please get this information to Ned Card NP.

## 2016-04-11 NOTE — Telephone Encounter (Signed)
Called and left message with patients mother stating his blood sugar was high today and inquired if pt was taking insulin at home. Was informed pt was at his CT scan and will call [place] back when he gets home.

## 2016-04-11 NOTE — Telephone Encounter (Signed)
-----   Message from Owens Shark, NP sent at 04/11/2016 11:55 AM EST ----- Please let him know blood sugar was 457 on labs done earlier today. Make sure he is checking his blood sugar at home and taking insulin. He will need to contact the provider who manages his diabetes if blood sugar remains this elevated.

## 2016-04-17 ENCOUNTER — Ambulatory Visit: Payer: BLUE CROSS/BLUE SHIELD | Admitting: Nurse Practitioner

## 2016-04-17 ENCOUNTER — Telehealth: Payer: Self-pay | Admitting: Nurse Practitioner

## 2016-04-17 ENCOUNTER — Ambulatory Visit (HOSPITAL_BASED_OUTPATIENT_CLINIC_OR_DEPARTMENT_OTHER): Payer: BLUE CROSS/BLUE SHIELD | Admitting: Nurse Practitioner

## 2016-04-17 DIAGNOSIS — G893 Neoplasm related pain (acute) (chronic): Secondary | ICD-10-CM

## 2016-04-17 DIAGNOSIS — C250 Malignant neoplasm of head of pancreas: Secondary | ICD-10-CM | POA: Diagnosis not present

## 2016-04-17 DIAGNOSIS — C251 Malignant neoplasm of body of pancreas: Secondary | ICD-10-CM

## 2016-04-17 DIAGNOSIS — Z7189 Other specified counseling: Secondary | ICD-10-CM

## 2016-04-17 MED ORDER — OXYCODONE-ACETAMINOPHEN 5-325 MG PO TABS: 1.0000 | ORAL_TABLET | Freq: Four times a day (QID) | ORAL | 0 refills | Status: DC | PRN

## 2016-04-17 NOTE — Telephone Encounter (Signed)
Appointments scheduled per 1/2 LOS. Patient given AVS report and calendars with future scheduled appointments. °

## 2016-04-17 NOTE — Progress Notes (Addendum)
Donald Steele OFFICE PROGRESS NOTE   Diagnosis:  Pancreas cancer  INTERVAL HISTORY:   Donald Steele returns as scheduled. Appetite remains poor. He continues to have issues with constipation. He is taking 1 stool softener a day. He is not taking miralax. His stools are "hard". Last bowel movement was 2 days ago. Percocet is partially relieving the abdominal pain.  Objective:  Vital signs in last 24 hours:  Blood pressure 107/77, pulse (!) 109, temperature 97.5 F (36.4 C), temperature source Oral, resp. rate 18, height 5\' 11"  (1.803 m), SpO2 100 %.    HEENT: No thrush or ulcers. Resp: Lungs clear bilaterally. Cardio: Regular rate and rhythm. GI: Abdomen is soft. No hepatomegaly. No mass. Vascular: No right leg edema. Left below-the-knee amputation. Port-A-Cath without erythema.    Lab Results:  Lab Results  Component Value Date   WBC 7.3 04/11/2016   HGB 8.7 (L) 04/11/2016   HCT 27.5 (L) 04/11/2016   MCV 77.2 (L) 04/11/2016   PLT 308 04/11/2016   NEUTROABS 3.6 04/11/2016    Imaging:  No results found.  Medications: I have reviewed the patient's current medications.  Assessment/Plan: 1. 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 Initiation of gemcitabine/Abraxane every 2 weeks beginning 01/09/2016 ? 02/06/2016 cycle 3 gemcitabine/Abraxane ? 04/11/2016 Restaging CT abdomen/pelvis-interval development of innumerable hepatic metastases measuring up to 14 mm in size; new 12 mm short axis lymph node gastrohepatic ligament; pancreatic head mass measures slightly larger; interval migration of one of the 2 metallic duodenal stents with no evidence for small bowel obstruction. 2. Diabetes  3. Peripheral vascular disease, status post stenting of  the left subclavian artery and peripheral bypass surgery  4. Cardiomyopathy  5. Leg and foot ulcer secondary to diabetes and peripheral vascular disease-followed at the wound clinic  6. Obstructive jaundice secondary to #1, status post placement of anexternal-internal biliary drain 12/03/2015, stent internalized 12/22/2015; bilirubin normal 01/09/2016  7. Anorexia/weight loss  8. Port-A-Cath placed 12/29/2015  9. Abdominal pain secondary to pancreas cancer  10.  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 He continues to have a poor performance status. The recent restaging CT scans showed evidence of progression with new liver lesions and increase in the size of the pancreas mass. We reviewed these results with Donald Steele and his sister. They understand that no therapy will be curative. We discussed switching to a different chemotherapy regimen versus a supportive care approach. He would like to proceed with chemotherapy. Dr. Benay Spice recommends the FOLFOX regimen. We reviewed potential toxicities including myelosuppression, nausea, allergic reaction. We discussed the neurotoxicity associated with oxaliplatin. We reviewed potential toxicities associated with 5-fluorouracil including mouth sores, diarrhea, hand-foot syndrome, increased sensitivity to sun, conjunctivitis. He will return for cycle 1 FOLFOX on 04/24/2016. He will return for a follow-up visit and cycle 2 FOLFOX on 05/08/2016. He will contact the office in the interim with any problems.  He was provided with a new Percocet prescription at today's visit.  He will increase the stool softener to twice daily and begin taking Miralax for the constipation.  Patient seen with Dr. Benay Spice. 25 minutes were spent face-to-face at today's visit with the majority of that time involved in counseling/coordination of care.    Ned Card  ANP/GNP-BC   04/17/2016  10:00 AM  This was a shared visit with Ned Card. We reviewed the restaging CT findings with Donald Steele and his sister. We discussed treatment options. He understands no therapy will be curative. We discussed the small chance of a clinical response with second line chemotherapy. He would like to proceed with chemotherapy. I recommend FOLFOX. We reviewed the potential toxicities associated with this regimen.  Julieanne Manson, M.D.

## 2016-04-19 ENCOUNTER — Ambulatory Visit (INDEPENDENT_AMBULATORY_CARE_PROVIDER_SITE_OTHER): Payer: BLUE CROSS/BLUE SHIELD | Admitting: Orthopedic Surgery

## 2016-04-22 ENCOUNTER — Other Ambulatory Visit: Payer: Self-pay | Admitting: Oncology

## 2016-04-23 ENCOUNTER — Ambulatory Visit (INDEPENDENT_AMBULATORY_CARE_PROVIDER_SITE_OTHER): Payer: BLUE CROSS/BLUE SHIELD | Admitting: Family

## 2016-04-23 ENCOUNTER — Encounter (INDEPENDENT_AMBULATORY_CARE_PROVIDER_SITE_OTHER): Payer: Self-pay | Admitting: Orthopedic Surgery

## 2016-04-23 VITALS — Ht 67.0 in

## 2016-04-23 DIAGNOSIS — Z89512 Acquired absence of left leg below knee: Secondary | ICD-10-CM

## 2016-04-23 NOTE — Progress Notes (Signed)
Office Visit Note   Patient: Donald Steele           Date of Birth: 1961/03/04           MRN: [id number] Visit Date: 04/23/2016              Requested by: Carlena Hurl, PA-C [address] [place], [place] [address] PCP: Crisoforo Oxford, PA-C  Chief Complaint  Patient presents with  . Left Knee - Routine Post Op    Left transtibial amputation 02/23/16 2 months post op    HPI: Patient is here for four week follow up for left transtibial amputation on 02/23/16. Patient is 2 months post op. His incision is well healed, there are no open areas. 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 It is about 2 months out. Presents today with no dressing on. States his shrinker is in the washer.  Assessment & Plan: Visit Diagnoses:  1. Status post below knee amputation, left (HCC)     Plan: Continue gauze and Ace wrap dressings or the vive shrinker daily. Him will continue with [employer] for prosthetic fabrication. Follow-up in office in 4 weeks.  Follow-Up Instructions: Return in about 1 month (around 05/24/2016).   Ortho Exam Left residual limb is well consolidated. There is one retained staple medially this was harvested today. There is eschar scattered along the incision line and this was debrided with gauze and a scalpel back to bleeding granulation tissue. Gauze and Ace wrap applied.  Imaging: No results found.  Orders:  No orders of the defined types were placed in this encounter.  No orders of the defined types were placed in this encounter.    Procedures: No procedures performed  Clinical Data: No additional findings.  Subjective: Review of Systems  Constitutional: Negative for chills and fever.  Skin: Positive for wound.    Objective: Vital Signs: Ht 5\' 7"  (1.702 m)   Specialty Comments:  No specialty comments  available.  PMFS History: Patient Active Problem List   Diagnosis Date Noted  . Goals of care, counseling/discussion 04/17/2016  . Poor nutrition 03/27/2016  . S/P amputation 03/27/2016  . Protein-calorie malnutrition, severe 02/23/2016  . Osteomyelitis of ankle and foot ([place]) 02/21/2016  . Diabetic foot ulcer ([place]) 02/21/2016  . Port catheter in place 01/09/2016  . Malignant neoplasm of pancreas ([place]) 12/16/2015  . Adenocarcinoma ([place]) 12/14/2015  . Liver enzyme elevation 12/14/2015  . Pancreatic insufficiency 12/09/2015  . 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 Obstructive jaundice due to malignant neoplasm ([place]) 12/01/2015  . Pancreatic mass 12/01/2015  . Obstructive jaundice 12/01/2015  . Former smoker 11/15/2015  . Vaccine counseling 11/15/2015  . Tooth decay 11/15/2015  . Ischemic cardiomyopathy 07/14/2015  . CAD (coronary artery disease) 07/14/2015  . Hypertensive heart disease 07/14/2015  . HLD (hyperlipidemia) 07/14/2015  . Subclavian artery stenosis, left ([place]) 07/07/2015  . Abnormal stress test   . Ischemic finger 06/28/2015  . Chronic combined systolic and diastolic CHF (congestive heart failure) ([place])   . 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, unspecified extremity ([place]) 06/01/2015  . Preop cardiovascular exam 06/01/2015  . Carotid bruit 06/01/2015  . Nonhealing ulcer of right lower extremity ([place])   . Type 2 diabetes mellitus with diabetic peripheral angiopathy and gangrene, with long-term current use of insulin ([place]) 05/31/2015  . Femoral bruit  05/31/2015  . Skin breakdown 05/31/2015  . Leg ulcer, left ([place]) 05/31/2015  . Type 2 diabetes mellitus with diabetic neuropathy, without long-term current use of insulin ([place]) 05/31/2015  . Right foot pain 05/31/2015  . PAD (peripheral artery disease) ([place]) 05/31/2015   Past Medical History:  Diagnosis Date  . 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 Cardiomyopathy ([place])    a.  Echo 2/17 - EF 35-40%, mod diff HK, inf-septal, inf-lat AK, Gr 1 DD, mild MR  . Carotid stenosis    a. Carotid US 2/17 - bilat ICA 1-39%  . Chronic combined systolic and diastolic CHF (congestive heart failure) ([place])   . Coronary artery disease   . Diabetes mellitus   . Duodenal stenosis   . Generalized weakness   . History of nuclear stress test    a. Nuc study 2/17 - Myocardial perfusion is abnormal. Findings consistent with prior myocardial infarction (inf, inf-lat, apical inf, apical lateral) with peri-infarct ischemia in apical inferolateral and apical lateral regions. This is a high risk study. Overall left ventricular systolic function was abnormal. LV cavity size is severely enlarged. Nuclear stress EF: 29%.   . Hyperlipidemia   . Hypertension   . Peripheral vascular disease due to secondary diabetes mellitus ([place])    a. s/p R Fem-Tibial bypass 2/17    Family History  Problem Relation Age of Onset  . Heart attack Neg Hx     Past Surgical History:  Procedure Laterality Date  . AMPUTATION Left 02/23/2016   Procedure: LEFT BELOW KNEE AMPUTATION;  Surgeon: Newt Minion, MD;  Location: [facility];  Service: Orthopedics;  Laterality: Left;  . CARDIAC CATHETERIZATION N/A 06/30/2015   Procedure: Left Heart Cath and Coronary Angiography;  Surgeon: Jettie Booze, MD;  Location: [facility];  Service: Cardiovascular;  Laterality: N/A;  . DUODENAL STENT PLACEMENT N/A 12/06/2015   Procedure: DUODENAL STENT PLACEMENT;  Surgeon: Carol Ada, MD;  Location: [facility];  Service: Endoscopy;  Laterality: N/A;  . ENDARTERECTOMY FEMORAL Right 06/02/2015   Procedure: ENDARTERECTOMY RIGHT PROFUNDA FEMORIS ARTERY;  Surgeon: Mal Misty, MD;  Location: [facility];  Service: Vascular;  Laterality: Right;  . ERCP N/A 12/02/2015   Procedure: ENDOSCOPIC RETROGRADE CHOLANGIOPANCREATOGRAPHY (ERCP);  Surgeon: Carol Ada, MD;  Location: [facility];  Service: Endoscopy;  Laterality: N/A;  .  ESOPHAGOGASTRODUODENOSCOPY (EGD) WITH PROPOFOL N/A 12/06/2015   Procedure: ESOPHAGOGASTRODUODENOSCOPY (EGD) WITH PROPOFOL;  Surgeon: Carol Ada, MD;  Location: [facility];  Service: Endoscopy;  Laterality: N/A;  . EUS N/A 12/06/2015   Procedure: UPPER ENDOSCOPIC ULTRASOUND (EUS) LINEAR;  Surgeon: Carol Ada, MD;  Location: [facility];  Service: Endoscopy;  Laterality: N/A;  . FEMORAL-TIBIAL BYPASS GRAFT Right 06/02/2015   Procedure: RIGHT PROFUNDA FEMORIS TO POSTERIOR TIBIAL ARTERY BYPASS USING RIGHT NON-REVERSED GREATER SAPHENOUS VEIN,  SUBCUTANEOUS TUNNEL;  Surgeon: Mal Misty, MD;  Location: [facility];  Service: Vascular;  Laterality: Right;  . INTRAOPERATIVE ARTERIOGRAM Right 06/02/2015   Procedure: INTRA OPERATIVE ARTERIOGRAM RIGHT LOWER LEG;  Surgeon: Mal Misty, MD;  Location: [facility];  Service: Vascular;  Laterality: Right;  . IR GENERIC HISTORICAL  12/03/2015   IR INT EXT BILIARY DRAIN WITH CHOLANGIOGRAM 12/03/2015 [name], MD [facility]  . IR GENERIC HISTORICAL  12/22/2015   IR BILIARY STENT(S) EXISTING ACCESS INC DILATION CATH EXCHANGE 12/22/2015 Corrie Mckusick, DO [facility]  . MULTIPLE TOOTH EXTRACTIONS    . PERIPHERAL VASCULAR CATHETERIZATION N/A 06/01/2015   Procedure: Abdominal  Aortogram;  Surgeon: Serafina Mitchell, MD;  Location: [facility];  Service: Cardiovascular;  Laterality: N/A;  . PERIPHERAL VASCULAR CATHETERIZATION N/A 07/07/2015   Procedure: Aortic Arch Angiography;  Surgeon: Conrad Modoc, MD;  Location: [facility];  Service: Cardiovascular;  Laterality: N/A;  . PERIPHERAL VASCULAR CATHETERIZATION N/A 07/07/2015   Procedure: Upper Extremity Angiography;  Surgeon: Conrad Athens, MD;  Location: [facility];  Service: Cardiovascular;  Laterality: N/A;  . PERIPHERAL VASCULAR CATHETERIZATION Left 07/07/2015   Procedure: Peripheral Vascular Intervention;  Surgeon: Conrad Ottawa, MD;  Location: [facility];  Service: Cardiovascular;  Laterality:  Left;  left subclavian and axillary  . PORTACATH PLACEMENT N/A 12/29/2015   Procedure: INSERTION PORT-A-CATH;  Surgeon: [name], MD;  Location: [facility];  Service: General;  Laterality: N/A;  . VEIN HARVEST Right 06/02/2015   Procedure: VEIN HARVEST RIGHT GREATER SAPHENOUS VEIN;  Surgeon: Mal Misty, MD;  Location: [facility];  Service: Vascular;  Laterality: Right;   Social History   Occupational History  . Not on file.   Social History Main Topics  . Smoking status: Former Smoker    Packs/day: 1.00    Years: 35.00    Quit date: 05/18/2015  . Smokeless tobacco: Never Used  . Alcohol use No  . Drug use: No  . Sexual activity: Not on file

## 2016-04-24 ENCOUNTER — Ambulatory Visit: Payer: BLUE CROSS/BLUE SHIELD

## 2016-04-24 ENCOUNTER — Ambulatory Visit: Payer: BLUE CROSS/BLUE SHIELD | Admitting: Nutrition

## 2016-04-24 ENCOUNTER — Ambulatory Visit (HOSPITAL_BASED_OUTPATIENT_CLINIC_OR_DEPARTMENT_OTHER): Payer: BLUE CROSS/BLUE SHIELD

## 2016-04-24 ENCOUNTER — Other Ambulatory Visit: Payer: BLUE CROSS/BLUE SHIELD

## 2016-04-24 ENCOUNTER — Other Ambulatory Visit: Payer: Self-pay | Admitting: Oncology

## 2016-04-24 ENCOUNTER — Ambulatory Visit (HOSPITAL_BASED_OUTPATIENT_CLINIC_OR_DEPARTMENT_OTHER): Payer: BLUE CROSS/BLUE SHIELD | Admitting: Oncology

## 2016-04-24 ENCOUNTER — Other Ambulatory Visit (HOSPITAL_BASED_OUTPATIENT_CLINIC_OR_DEPARTMENT_OTHER): Payer: BLUE CROSS/BLUE SHIELD

## 2016-04-24 VITALS — BP 110/62 | HR 91 | Temp 97.7°F | Resp 18 | Wt 115.4 lb

## 2016-04-24 DIAGNOSIS — Z5111 Encounter for antineoplastic chemotherapy: Secondary | ICD-10-CM

## 2016-04-24 DIAGNOSIS — C250 Malignant neoplasm of head of pancreas: Secondary | ICD-10-CM

## 2016-04-24 DIAGNOSIS — R739 Hyperglycemia, unspecified: Secondary | ICD-10-CM | POA: Diagnosis not present

## 2016-04-24 DIAGNOSIS — Z95828 Presence of other vascular implants and grafts: Secondary | ICD-10-CM

## 2016-04-24 DIAGNOSIS — E119 Type 2 diabetes mellitus without complications: Secondary | ICD-10-CM | POA: Diagnosis not present

## 2016-04-24 DIAGNOSIS — G893 Neoplasm related pain (acute) (chronic): Secondary | ICD-10-CM

## 2016-04-24 LAB — WHOLE BLOOD GLUCOSE
GLUCOSE: 339 mg/dL — AB (ref 70–100)
Glucose: 389 mg/dL — ABNORMAL HIGH (ref 70–100)
HRS PC: 7 Hours
HRS PC: 8 h

## 2016-04-24 LAB — CBC WITH DIFFERENTIAL/PLATELET
BASO%: 0.6 % (ref 0.0–2.0)
Basophils Absolute: 0 10*3/uL (ref 0.0–0.1)
EOS%: 2 % (ref 0.0–7.0)
Eosinophils Absolute: 0.1 10*3/uL (ref 0.0–0.5)
HCT: 25.5 % — ABNORMAL LOW (ref 38.4–49.9)
HGB: 8 g/dL — ABNORMAL LOW (ref 13.0–17.1)
LYMPH%: 32.4 % (ref 14.0–49.0)
MCH: 23.9 pg — ABNORMAL LOW (ref 27.2–33.4)
MCHC: 31.4 g/dL — ABNORMAL LOW (ref 32.0–36.0)
MCV: 76.1 fL — AB (ref 79.3–98.0)
MONO#: 0.7 10*3/uL (ref 0.1–0.9)
MONO%: 9.9 % (ref 0.0–14.0)
NEUT%: 55.1 % (ref 39.0–75.0)
NEUTROS ABS: 3.9 10*3/uL (ref 1.5–6.5)
Platelets: 309 10*3/uL (ref 140–400)
RBC: 3.35 10*6/uL — AB (ref 4.20–5.82)
RDW: 15.6 % — AB (ref 11.0–14.6)
WBC: 7.1 10*3/uL (ref 4.0–10.3)
lymph#: 2.3 10*3/uL (ref 0.9–3.3)

## 2016-04-24 LAB — COMPREHENSIVE METABOLIC PANEL
ALBUMIN: 2.7 g/dL — AB (ref 3.5–5.0)
ALT: 21 U/L (ref 0–55)
AST: 18 U/L (ref 5–34)
Alkaline Phosphatase: 342 U/L — ABNORMAL HIGH (ref 40–150)
Anion Gap: 7 mEq/L (ref 3–11)
BUN: 12 mg/dL (ref 7.0–26.0)
CALCIUM: 9.2 mg/dL (ref 8.4–10.4)
CHLORIDE: 96 meq/L — AB (ref 98–109)
CO2: 27 mEq/L (ref 22–29)
CREATININE: 1 mg/dL (ref 0.7–1.3)
EGFR: >90 mL/min/{1.73_m2} (ref >90)
Glucose: 553 mg/dl (ref 70–140)
Potassium: 4.5 mEq/L (ref 3.5–5.1)
Sodium: 130 mEq/L — ABNORMAL LOW (ref 136–145)
Total Bilirubin: 0.33 mg/dL (ref 0.20–1.20)
Total Protein: 6.9 g/dL (ref 6.4–8.3)

## 2016-04-24 LAB — CEA (IN HOUSE-CHCC)

## 2016-04-24 MED ORDER — DEXTROSE 5 % IV SOLN
Freq: Once | INTRAVENOUS | Status: AC
  Administered 2016-04-24: 15:00:00 via INTRAVENOUS

## 2016-04-24 MED ORDER — HEPARIN SOD (PORK) LOCK FLUSH 100 UNIT/ML IV SOLN
500.0000 [IU] | Freq: Once | INTRAVENOUS | Status: DC | PRN
  Filled 2016-04-24: qty 5

## 2016-04-24 MED ORDER — PALONOSETRON HCL INJECTION 0.25 MG/5ML
INTRAVENOUS | Status: AC
  Filled 2016-04-24: qty 5

## 2016-04-24 MED ORDER — SODIUM CHLORIDE 0.9 % IV SOLN
2400.0000 mg/m2 | INTRAVENOUS | Status: DC
  Administered 2016-04-24: 3750 mg via INTRAVENOUS
  Filled 2016-04-24: qty 75

## 2016-04-24 MED ORDER — OXALIPLATIN CHEMO INJECTION 100 MG/20ML
85.0000 mg/m2 | Freq: Once | INTRAVENOUS | Status: AC
  Administered 2016-04-24: 135 mg via INTRAVENOUS
  Filled 2016-04-24: qty 20

## 2016-04-24 MED ORDER — SODIUM CHLORIDE 0.9 % IJ SOLN
10.0000 mL | INTRAMUSCULAR | Status: DC | PRN
Start: 2016-04-24 — End: 2016-04-24
  Administered 2016-04-24: 10 mL via INTRAVENOUS
  Filled 2016-04-24: qty 10

## 2016-04-24 MED ORDER — DEXTROSE 5 % IV SOLN
400.0000 mg/m2 | Freq: Once | INTRAVENOUS | Status: AC
  Administered 2016-04-24: 624 mg via INTRAVENOUS
  Filled 2016-04-24: qty 31.2

## 2016-04-24 MED ORDER — PALONOSETRON HCL INJECTION 0.25 MG/5ML
0.2500 mg | Freq: Once | INTRAVENOUS | Status: AC
  Administered 2016-04-24: 0.25 mg via INTRAVENOUS

## 2016-04-24 MED ORDER — INSULIN REGULAR HUMAN 100 UNIT/ML IJ SOLN
20.0000 [IU] | Freq: Once | INTRAMUSCULAR | Status: AC
  Administered 2016-04-24: 20 [IU] via SUBCUTANEOUS
  Filled 2016-04-24: qty 0.2

## 2016-04-24 MED ORDER — INSULIN REGULAR HUMAN 100 UNIT/ML IJ SOLN
10.0000 [IU] | Freq: Once | INTRAMUSCULAR | Status: AC
  Administered 2016-04-24: 10 [IU] via SUBCUTANEOUS
  Filled 2016-04-24: qty 0.1

## 2016-04-24 MED ORDER — FLUOROURACIL CHEMO INJECTION 2.5 GM/50ML
400.0000 mg/m2 | Freq: Once | INTRAVENOUS | Status: AC
  Administered 2016-04-24: 600 mg via INTRAVENOUS
  Filled 2016-04-24: qty 12

## 2016-04-24 MED ORDER — SODIUM CHLORIDE 0.9 % IV SOLN: 10.0000 mg | Freq: Once | INTRAVENOUS | Status: DC

## 2016-04-24 MED ORDER — DEXAMETHASONE SODIUM PHOSPHATE 10 MG/ML IJ SOLN
5.0000 mg | Freq: Once | INTRAMUSCULAR | Status: AC
  Administered 2016-04-24: 5 mg via INTRAVENOUS

## 2016-04-24 MED ORDER — SODIUM CHLORIDE 0.9% FLUSH
10.0000 mL | INTRAVENOUS | Status: DC | PRN
  Filled 2016-04-24: qty 10

## 2016-04-24 MED ORDER — DEXAMETHASONE SODIUM PHOSPHATE 10 MG/ML IJ SOLN
INTRAMUSCULAR | Status: AC
  Filled 2016-04-24: qty 1

## 2016-04-24 NOTE — Progress Notes (Signed)
Per Dr. Benay Spice, [place] to treat with blood sugar of 553. MD verbal order to give 20 units of insulin subQ and recheck blood glucose.   Wylene Simmer, BSN, RN 04/24/2016 1:26 PM

## 2016-04-24 NOTE — Patient Instructions (Signed)
[facility] Discharge Instructions for Patients Receiving Chemotherapy  Today you received the following chemotherapy agents:  Oxaliplatin, Leucovorin, Fluorouracil  To help prevent nausea and vomiting after your treatment, we encourage you to take your nausea medication as prescribed.   If you develop nausea and vomiting that is not controlled by your nausea medication, call the clinic.   BELOW ARE SYMPTOMS THAT SHOULD BE REPORTED IMMEDIATELY:  *FEVER GREATER THAN 100.5 F  *CHILLS WITH OR WITHOUT FEVER  NAUSEA AND VOMITING THAT IS NOT CONTROLLED WITH YOUR NAUSEA MEDICATION  *UNUSUAL SHORTNESS OF BREATH  *UNUSUAL BRUISING OR BLEEDING  TENDERNESS IN MOUTH AND THROAT WITH OR WITHOUT PRESENCE OF ULCERS  *URINARY PROBLEMS  *BOWEL PROBLEMS  UNUSUAL RASH Items with * indicate a potential emergency and should be followed up as soon as possible.  Feel free to call the clinic you have any questions or concerns. The clinic phone number is [phone].  Please show the [facility] at check-in to the Emergency Department and triage nurse.  Fluorouracil, 5-FU injection What is this medicine? FLUOROURACIL, 5-FU (flure oh YOOR a sil) is a chemotherapy drug. It slows the growth of cancer cells. This medicine is used to treat many types of cancer like breast cancer, colon or rectal cancer, pancreatic cancer, and stomach cancer. This medicine may be used for other purposes; ask your health care provider or pharmacist if you have questions. COMMON BRAND NAME(S): Adrucil What should I tell my health care provider before I take this medicine? They need to know if you have any of these conditions: -blood disorders -dihydropyrimidine dehydrogenase (DPD) deficiency -infection (especially a virus infection such as chickenpox, cold sores, or herpes) -kidney disease -liver disease -malnourished, poor nutrition -recent or ongoing radiation therapy -an unusual or allergic  reaction to fluorouracil, other chemotherapy, other medicines, foods, dyes, or preservatives -pregnant or trying to get pregnant -breast-feeding How should I use this medicine? This drug is given as an infusion or injection into a vein. It is administered in a hospital or clinic by a specially trained health care professional. Talk to your pediatrician regarding the use of this medicine in children. Special care may be needed. Overdosage: If you think you have taken too much of this medicine contact a poison control center or emergency room at once. NOTE: This medicine is only for you. Do not share this medicine with others. What if I miss a dose? It is important not to miss your dose. Call your doctor or health care professional if you are unable to keep an appointment. What may interact with this medicine? -allopurinol -cimetidine -dapsone -digoxin -hydroxyurea -leucovorin -levamisole -medicines for seizures like ethotoin, fosphenytoin, phenytoin -medicines to increase blood counts like filgrastim, pegfilgrastim, sargramostim -medicines that treat or prevent blood clots like warfarin, enoxaparin, and dalteparin -methotrexate -metronidazole -pyrimethamine -some other chemotherapy drugs like busulfan, cisplatin, estramustine, vinblastine -trimethoprim -trimetrexate -vaccines Talk to your doctor or health care professional before taking any of these medicines: -acetaminophen -aspirin -ibuprofen -ketoprofen -naproxen This list may not describe all possible interactions. Give your health care provider a list of all the medicines, herbs, non-prescription drugs, or dietary supplements you use. Also tell them if you smoke, drink alcohol, or use illegal drugs. Some items may interact with your medicine. What should I watch for while using this medicine? Visit your doctor for checks on your progress. This drug may make you feel generally unwell. This is not uncommon, as chemotherapy can  affect healthy cells as well  as cancer cells. Report any side effects. Continue your course of treatment even though you feel ill unless your doctor tells you to stop. In some cases, you may be given additional medicines to help with side effects. Follow all directions for their use. Call your doctor or health care professional for advice if you get a fever, chills or sore throat, or other symptoms of a cold or flu. Do not treat yourself. This drug decreases your body's ability to fight infections. Try to avoid being around people who are sick. This medicine may increase your risk to bruise or bleed. Call your doctor or health care professional if you notice any unusual bleeding. Be careful brushing and flossing your teeth or using a toothpick because you may get an infection or bleed more easily. If you have any dental work done, tell your dentist you are receiving this medicine. Avoid taking products that contain aspirin, acetaminophen, ibuprofen, naproxen, or ketoprofen unless instructed by your doctor. These medicines may hide a fever. Do not become pregnant while taking this medicine. Women should inform their doctor if they wish to become pregnant or think they might be pregnant. There is a potential for serious side effects to an unborn child. Talk to your health care professional or pharmacist for more information. Do not breast-feed an infant while taking this medicine. Men should inform their doctor if they wish to father a child. This medicine may lower sperm counts. Do not treat diarrhea with over the counter products. Contact your doctor if you have diarrhea that lasts more than 2 days or if it is severe and watery. This medicine can make you more sensitive to the sun. Keep out of the sun. If you cannot avoid being in the sun, wear protective clothing and use sunscreen. Do not use sun lamps or tanning beds/booths. What side effects may I notice from receiving this medicine? Side effects that  you should report to your doctor or health care professional as soon as possible: -allergic reactions like skin rash, itching or hives, swelling of the face, lips, or tongue -low blood counts - this medicine may decrease the number of white blood cells, red blood cells and platelets. You may be at increased risk for infections and bleeding. -signs of infection - fever or chills, cough, sore throat, pain or difficulty passing urine -signs of decreased platelets or bleeding - bruising, pinpoint red spots on the skin, black, tarry stools, blood in the urine -signs of decreased red blood cells - unusually weak or tired, fainting spells, lightheadedness -breathing problems -changes in vision -chest pain -mouth sores -nausea and vomiting -pain, swelling, redness at site where injected -pain, tingling, numbness in the hands or feet -redness, swelling, or sores on hands or feet -stomach pain -unusual bleeding Side effects that usually do not require medical attention (report to your doctor or health care professional if they continue or are bothersome): -changes in finger or toe nails -diarrhea -dry or itchy skin -hair loss -headache -loss of appetite -sensitivity of eyes to the light -stomach upset -unusually teary eyes This list may not describe all possible side effects. Call your doctor for medical advice about side effects. You may report side effects to FDA at 1-800-FDA-1088. Where should I keep my medicine? This drug is given in a hospital or clinic and will not be stored at home. NOTE: This sheet is a summary. It may not cover all possible information. If you have questions about this medicine, talk to your doctor, pharmacist,  or health care provider.  2017 Elsevier/Gold Standard (2007-08-06 13:53:16) Oxaliplatin Injection What is this medicine? OXALIPLATIN (ox AL i PLA tin) is a chemotherapy drug. It targets fast dividing cells, like cancer cells, and causes these cells to die.  This medicine is used to treat cancers of the colon and rectum, and many other cancers. This medicine may be used for other purposes; ask your health care provider or pharmacist if you have questions. COMMON BRAND NAME(S): Eloxatin What should I tell my health care provider before I take this medicine? They need to know if you have any of these conditions: -kidney disease -an unusual or allergic reaction to oxaliplatin, other chemotherapy, other medicines, foods, dyes, or preservatives -pregnant or trying to get pregnant -breast-feeding How should I use this medicine? This drug is given as an infusion into a vein. It is administered in a hospital or clinic by a specially trained health care professional. Talk to your pediatrician regarding the use of this medicine in children. Special care may be needed. Overdosage: If you think you have taken too much of this medicine contact a poison control center or emergency room at once. NOTE: This medicine is only for you. Do not share this medicine with others. What if I miss a dose? It is important not to miss a dose. Call your doctor or health care professional if you are unable to keep an appointment. What may interact with this medicine? -medicines to increase blood counts like filgrastim, pegfilgrastim, sargramostim -probenecid -some antibiotics like amikacin, gentamicin, neomycin, polymyxin B, streptomycin, tobramycin -zalcitabine Talk to your doctor or health care professional before taking any of these medicines: -acetaminophen -aspirin -ibuprofen -ketoprofen -naproxen This list may not describe all possible interactions. Give your health care provider a list of all the medicines, herbs, non-prescription drugs, or dietary supplements you use. Also tell them if you smoke, drink alcohol, or use illegal drugs. Some items may interact with your medicine. What should I watch for while using this medicine? Your condition will be monitored  carefully while you are receiving this medicine. You will need important blood work done while you are taking this medicine. This medicine can make you more sensitive to cold. Do not drink cold drinks or use ice. Cover exposed skin before coming in contact with cold temperatures or cold objects. When out in cold weather wear warm clothing and cover your mouth and nose to warm the air that goes into your lungs. Tell your doctor if you get sensitive to the cold. This drug may make you feel generally unwell. This is not uncommon, as chemotherapy can affect healthy cells as well as cancer cells. Report any side effects. Continue your course of treatment even though you feel ill unless your doctor tells you to stop. In some cases, you may be given additional medicines to help with side effects. Follow all directions for their use. Call your doctor or health care professional for advice if you get a fever, chills or sore throat, or other symptoms of a cold or flu. Do not treat yourself. This drug decreases your body's ability to fight infections. Try to avoid being around people who are sick. This medicine may increase your risk to bruise or bleed. Call your doctor or health care professional if you notice any unusual bleeding. Be careful brushing and flossing your teeth or using a toothpick because you may get an infection or bleed more easily. If you have any dental work done, tell your dentist you are receiving  this medicine. Avoid taking products that contain aspirin, acetaminophen, ibuprofen, naproxen, or ketoprofen unless instructed by your doctor. These medicines may hide a fever. Do not become pregnant while taking this medicine. Women should inform their doctor if they wish to become pregnant or think they might be pregnant. There is a potential for serious side effects to an unborn child. Talk to your health care professional or pharmacist for more information. Do not breast-feed an infant while taking  this medicine. Call your doctor or health care professional if you get diarrhea. Do not treat yourself. What side effects may I notice from receiving this medicine? Side effects that you should report to your doctor or health care professional as soon as possible: -allergic reactions like skin rash, itching or hives, swelling of the face, lips, or tongue -low blood counts - This drug may decrease the number of white blood cells, red blood cells and platelets. You may be at increased risk for infections and bleeding. -signs of infection - fever or chills, cough, sore throat, pain or difficulty passing urine -signs of decreased platelets or bleeding - bruising, pinpoint red spots on the skin, black, tarry stools, nosebleeds -signs of decreased red blood cells - unusually weak or tired, fainting spells, lightheadedness -breathing problems -chest pain, pressure -cough -diarrhea -jaw tightness -mouth sores -nausea and vomiting -pain, swelling, redness or irritation at the injection site -pain, tingling, numbness in the hands or feet -problems with balance, talking, walking -redness, blistering, peeling or loosening of the skin, including inside the mouth -trouble passing urine or change in the amount of urine Side effects that usually do not require medical attention (report to your doctor or health care professional if they continue or are bothersome): -changes in vision -constipation -hair loss -loss of appetite -metallic taste in the mouth or changes in taste -stomach pain This list may not describe all possible side effects. Call your doctor for medical advice about side effects. You may report side effects to FDA at 1-800-FDA-1088. Where should I keep my medicine? This drug is given in a hospital or clinic and will not be stored at home. NOTE: This sheet is a summary. It may not cover all possible information. If you have questions about this medicine, talk to your doctor, pharmacist,  or health care provider.  2017 Elsevier/Gold Standard (2007-10-28 17:22:47) Leucovorin injection What is this medicine? LEUCOVORIN (loo koe VOR in) is used to prevent or treat the harmful effects of some medicines. This medicine is used to treat anemia caused by a low amount of folic acid in the body. It is also used with 5-fluorouracil (5-FU) to treat colon cancer. This medicine may be used for other purposes; ask your health care provider or pharmacist if you have questions. What should I tell my health care provider before I take this medicine? They need to know if you have any of these conditions: -anemia from low levels of vitamin B-12 in the blood -an unusual or allergic reaction to leucovorin, folic acid, other medicines, foods, dyes, or preservatives -pregnant or trying to get pregnant -breast-feeding How should I use this medicine? This medicine is for injection into a muscle or into a vein. It is given by a health care professional in a hospital or clinic setting. Talk to your pediatrician regarding the use of this medicine in children. Special care may be needed. Overdosage: If you think you have taken too much of this medicine contact a poison control center or emergency room at once. NOTE:  This medicine is only for you. Do not share this medicine with others. What if I miss a dose? This does not apply. What may interact with this medicine? -capecitabine -fluorouracil -phenobarbital -phenytoin -primidone -trimethoprim-sulfamethoxazole This list may not describe all possible interactions. Give your health care provider a list of all the medicines, herbs, non-prescription drugs, or dietary supplements you use. Also tell them if you smoke, drink alcohol, or use illegal drugs. Some items may interact with your medicine. What should I watch for while using this medicine? Your condition will be monitored carefully while you are receiving this medicine. This medicine may increase  the side effects of 5-fluorouracil, 5-FU. Tell your doctor or health care professional if you have diarrhea or mouth sores that do not get better or that get worse. What side effects may I notice from receiving this medicine? Side effects that you should report to your doctor or health care professional as soon as possible: -allergic reactions like skin rash, itching or hives, swelling of the face, lips, or tongue -breathing problems -fever, infection -mouth sores -unusual bleeding or bruising -unusually weak or tired Side effects that usually do not require medical attention (report to your doctor or health care professional if they continue or are bothersome): -constipation or diarrhea -loss of appetite -nausea, vomiting This list may not describe all possible side effects. Call your doctor for medical advice about side effects. You may report side effects to FDA at 1-800-FDA-1088. Where should I keep my medicine? This drug is given in a hospital or clinic and will not be stored at home. NOTE: This sheet is a summary. It may not cover all possible information. If you have questions about this medicine, talk to your doctor, pharmacist, or health care provider.  2017 Elsevier/Gold Standard (2007-10-07 16:50:29)

## 2016-04-24 NOTE — Progress Notes (Signed)
Per Dr. Benay Spice, Hunt to treat with BG of 553. MD verbal order to give 20 subQ insulin and recheck blood glucose.

## 2016-04-24 NOTE — Progress Notes (Signed)
Nutrition follow-up completed with patient receiving FOLFOX for cancer of the pancreas.   56 year old male with history of uncontrolled diabetes, peripheral vascular disease with chronic left lower extremity foot ulcer that has been managed at wound care center, sent to ER for further evaluation of worsening on. MRI of left foot consistent with cellulitis, myofascitis and osteomyelitis.  PROCEDURE (11/9): Transtibial amputation Application of Prevena wound VAC  Current weight documented as 115 pounds January 9 decreased from 168.2 pounds September 1.  This reflects a 32% weight loss. Noted blood sugar of 553. Patient complains of constipation, and stomach pain. Reports he has not taken any insulin since last Tuesday or Thursday. States the only thing he has consumed today is 1 bottle of Ensure Plus at breakfast which provides 350 cal, 13 g protein, 50 g of carbohydrate.  He has not been checking his blood sugars at home.  Severe malnutrition in the context of chronic illness continues.  Nutrition diagnosis: Inadequate oral intake continues.  Intervention: Stressed the importance of patient trying to eat at mealtimes following carbohydrate controlled diet.  Recommended patient continue Ensure Plus for additional calories and protein. Stressed importance of patient taking insulin and checking blood sugars as directed by his physician. Educated patient on the importance of bowel regimen and relieving constipation. Patient verbalizes understanding and willingness to follow recommendations.   Provided fact sheet on managing blood sugars.  Monitoring, evaluation, goals:  Patient will increase oral intake, take insulin as prescribed and monitor CBGs.  Next visit: Tuesday, January 23, during infusion.  **Disclaimer: This note was dictated with voice recognition software. Similar sounding words can inadvertently be transcribed and this note may contain transcription errors which may not have  been corrected upon publication of note.**    Next visit:

## 2016-04-24 NOTE — Progress Notes (Signed)
  [name] OFFICE PROGRESS NOTE   Diagnosis: Pancreas cancer, diabetes  INTERVAL HISTORY:   Donald Steele returns for cycle 1 FOLFOX. I was called to see him in the chemotherapy infusion room when he was noted to have marked hyperglycemia. He has not been checking his blood sugar or using insulin. His diet consists of Ensure only. He continues to have abdominal pain after eating. This last a few "minutes ". He presents today with his sister.  Objective:  Vital signs in last 24 hours:  There were no vitals taken for this visit.    HEENT: Mild whitecoat over the tongue, no buccal thrush Resp: Lungs clear bilaterally Cardio: Regular rate and rhythm GI: The abdomen is soft, no hepatosplenomegaly, nontender Vascular: No leg edema   Portacath/PICC-without erythema  Lab Results:  Lab Results  Component Value Date   WBC 7.1 04/24/2016   HGB 8.0 (L) 04/24/2016   HCT 25.5 (L) 04/24/2016   MCV 76.1 (L) 04/24/2016   PLT 309 04/24/2016   NEUTROABS 3.9 04/24/2016   Glucose 553, BUN 12, creatinine 1.0, bilirubin 0.33  Medications: I have reviewed the patient's current medications.  Assessment/Plan: 1. 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 Initiation of gemcitabine/Abraxane every 2 weeks beginning 01/09/2016 ? 02/06/2016 cycle 3 gemcitabine/Abraxane ? 04/11/2016 Restaging CT abdomen/pelvis-interval development of innumerable hepatic metastases measuring up to 14 mm in size; new 12 mm short axis lymph node gastrohepatic ligament; pancreatic head mass measures slightly larger; interval migration of one of the 2 metallic duodenal stents with no evidence for small bowel obstruction. ? Cycle 1 FOLFOX 04/24/2016 2. Diabetes  3. Peripheral vascular  disease, status post stenting of the left subclavian artery and peripheral bypass surgery  4. Cardiomyopathy  5. Leg and foot ulcer secondary to diabetes and peripheral vascular disease-followed at the wound clinic  6. Obstructive jaundice secondary to #1, status post placement of anexternal-internal biliary drain 12/03/2015, stent internalized 12/22/2015; bilirubin normal 01/09/2016  7. Anorexia/weight loss  8. Port-A-Cath placed 12/29/2015  9. Abdominal pain secondary to pancreas cancer  10. 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 He has marked hyperglycemia. He has not been checking his blood sugar or using insulin. I stressed the importance of continuing insulin. I recommended he check the blood sugar 3 times daily and use the sliding scale provided by his primary physician. He will contact us if the blood sugar is running consistently a greater than 300. We will arrange for a meeting with the Cancer center nutritionist to discuss an appropriate diabetic diet.  Donald Steele will return for an office visit as scheduled on 05/08/2016.  Approximately 25 minutes were spent with the patient today. The majority of the time was used for counseling and coordination of care.  We adjusted the Decadron antiemetic dose to 5 mg.   Betsy Coder, MD  04/24/2016  2:29 PM

## 2016-04-25 LAB — CANCER ANTIGEN 19-9: CA 19-9: 17 U/mL (ref 0–35)

## 2016-04-26 ENCOUNTER — Ambulatory Visit (HOSPITAL_BASED_OUTPATIENT_CLINIC_OR_DEPARTMENT_OTHER): Payer: BLUE CROSS/BLUE SHIELD

## 2016-04-26 ENCOUNTER — Telehealth: Payer: Self-pay | Admitting: *Deleted

## 2016-04-26 VITALS — BP 111/59 | HR 110 | Temp 98.2°F | Resp 18

## 2016-04-26 DIAGNOSIS — C250 Malignant neoplasm of head of pancreas: Secondary | ICD-10-CM | POA: Diagnosis not present

## 2016-04-26 DIAGNOSIS — Z452 Encounter for adjustment and management of vascular access device: Secondary | ICD-10-CM | POA: Diagnosis not present

## 2016-04-26 LAB — CEA: CEA: 2968 ng/mL — ABNORMAL HIGH (ref 0.0–4.7)

## 2016-04-26 MED ORDER — SODIUM CHLORIDE 0.9% FLUSH
10.0000 mL | INTRAVENOUS | Status: DC | PRN
  Administered 2016-04-26: 10 mL
  Filled 2016-04-26: qty 10

## 2016-04-26 MED ORDER — HEPARIN SOD (PORK) LOCK FLUSH 100 UNIT/ML IV SOLN
500.0000 [IU] | Freq: Once | INTRAVENOUS | Status: AC | PRN
  Administered 2016-04-26: 500 [IU]
  Filled 2016-04-26: qty 5

## 2016-04-26 NOTE — Telephone Encounter (Signed)
Oncology Nurse Navigator Documentation  Oncology Nurse Navigator Flowsheets 04/26/2016  Navigator Location [facility]  Referral date to RadOnc/MedOnc -  Navigator Encounter Type Telephone  Telephone Patient Update  Abnormal Finding Date -  Confirmed Diagnosis Date -  Treatment Initiated Date 04/24/2016  Patient Visit Type -  Treatment Phase Active Tx--1st FOLFOX 04/24/16  Barriers/Navigation Needs Education  Education Symptom Management--push fluids and eat several times/day  Interventions Education  Coordination of Care -  Education Method Verbal  Support Groups/Services -  Acuity Level 1  Time Spent with Patient 15  Patient had just gotten up when navigator called to check on him. He denies any nausea or diarrhea. Encouraged him to eat soon and to eat several small meals/day. Inquired if he had nausea medication (none on his list). He tells RN that he has something from the last chemo he had, but does not know the name of the drug. He will be coming in today to d/c his pump.

## 2016-05-06 ENCOUNTER — Other Ambulatory Visit: Payer: Self-pay | Admitting: Oncology

## 2016-05-08 ENCOUNTER — Ambulatory Visit (HOSPITAL_BASED_OUTPATIENT_CLINIC_OR_DEPARTMENT_OTHER): Payer: BLUE CROSS/BLUE SHIELD | Admitting: Nurse Practitioner

## 2016-05-08 ENCOUNTER — Ambulatory Visit (HOSPITAL_COMMUNITY)
Admission: RE | Admit: 2016-05-08 | Discharge: 2016-05-08 | Disposition: A | Discharge status: Home / Self Care | Payer: BLUE CROSS/BLUE SHIELD | Source: Ambulatory Visit | Attending: Oncology | Admitting: Oncology

## 2016-05-08 ENCOUNTER — Ambulatory Visit: Payer: BLUE CROSS/BLUE SHIELD

## 2016-05-08 ENCOUNTER — Other Ambulatory Visit (HOSPITAL_BASED_OUTPATIENT_CLINIC_OR_DEPARTMENT_OTHER): Payer: BLUE CROSS/BLUE SHIELD

## 2016-05-08 VITALS — BP 101/64 | HR 134 | Temp 98.7°F | Resp 16 | Ht 67.0 in

## 2016-05-08 DIAGNOSIS — R630 Anorexia: Secondary | ICD-10-CM

## 2016-05-08 DIAGNOSIS — E119 Type 2 diabetes mellitus without complications: Secondary | ICD-10-CM | POA: Diagnosis not present

## 2016-05-08 DIAGNOSIS — D649 Anemia, unspecified: Secondary | ICD-10-CM

## 2016-05-08 DIAGNOSIS — G893 Neoplasm related pain (acute) (chronic): Secondary | ICD-10-CM

## 2016-05-08 DIAGNOSIS — Z95828 Presence of other vascular implants and grafts: Secondary | ICD-10-CM

## 2016-05-08 DIAGNOSIS — C250 Malignant neoplasm of head of pancreas: Secondary | ICD-10-CM

## 2016-05-08 LAB — COMPREHENSIVE METABOLIC PANEL
ALT: 15 U/L (ref 0–55)
ANION GAP: 11 meq/L (ref 3–11)
AST: 15 U/L (ref 5–34)
Albumin: 2.5 g/dL — ABNORMAL LOW (ref 3.5–5.0)
Alkaline Phosphatase: 293 U/L — ABNORMAL HIGH (ref 40–150)
BUN: 20.9 mg/dL (ref 7.0–26.0)
CHLORIDE: 96 meq/L — AB (ref 98–109)
CO2: 24 meq/L (ref 22–29)
CREATININE: 0.9 mg/dL (ref 0.7–1.3)
Calcium: 9.2 mg/dL (ref 8.4–10.4)
Glucose: 400 mg/dl — ABNORMAL HIGH (ref 70–140)
POTASSIUM: 4.5 meq/L (ref 3.5–5.1)
Sodium: 132 mEq/L — ABNORMAL LOW (ref 136–145)
Total Bilirubin: 0.53 mg/dL (ref 0.20–1.20)
Total Protein: 6.4 g/dL (ref 6.4–8.3)

## 2016-05-08 LAB — CBC WITH DIFFERENTIAL/PLATELET
BASO%: 0.7 % (ref 0.0–2.0)
BASOS ABS: 0 10*3/uL (ref 0.0–0.1)
EOS%: 0.7 % (ref 0.0–7.0)
Eosinophils Absolute: 0 10*3/uL (ref 0.0–0.5)
HEMATOCRIT: 23.1 % — AB (ref 38.4–49.9)
HGB: 7.4 g/dL — ABNORMAL LOW (ref 13.0–17.1)
LYMPH#: 2.2 10*3/uL (ref 0.9–3.3)
LYMPH%: 39.4 % (ref 14.0–49.0)
MCH: 25.1 pg — AB (ref 27.2–33.4)
MCHC: 32.1 g/dL (ref 32.0–36.0)
MCV: 78.4 fL — ABNORMAL LOW (ref 79.3–98.0)
MONO#: 1 10*3/uL — AB (ref 0.1–0.9)
MONO%: 18 % — ABNORMAL HIGH (ref 0.0–14.0)
NEUT#: 2.3 10*3/uL (ref 1.5–6.5)
NEUT%: 41.2 % (ref 39.0–75.0)
PLATELETS: 262 10*3/uL (ref 140–400)
RBC: 2.95 10*6/uL — AB (ref 4.20–5.82)
RDW: 18.2 % — ABNORMAL HIGH (ref 11.0–14.6)
WBC: 5.6 10*3/uL (ref 4.0–10.3)

## 2016-05-08 MED ORDER — OXYCODONE-ACETAMINOPHEN 5-325 MG PO TABS: 1.0000 | ORAL_TABLET | Freq: Four times a day (QID) | ORAL | 0 refills | Status: AC | PRN

## 2016-05-08 MED ORDER — HEPARIN SOD (PORK) LOCK FLUSH 100 UNIT/ML IV SOLN
500.0000 [IU] | Freq: Once | INTRAVENOUS | Status: AC | PRN
  Administered 2016-05-08: 500 [IU] via INTRAVENOUS
  Filled 2016-05-08: qty 5

## 2016-05-08 MED ORDER — SODIUM CHLORIDE 0.9 % IJ SOLN
10.0000 mL | INTRAMUSCULAR | Status: DC | PRN
  Administered 2016-05-08: 10 mL via INTRAVENOUS
  Filled 2016-05-08: qty 10

## 2016-05-08 MED ORDER — SODIUM CHLORIDE 0.9 % IV SOLN
Freq: Once | INTRAVENOUS | Status: AC
  Administered 2016-05-08: 11:00:00 via INTRAVENOUS

## 2016-05-08 MED ORDER — SODIUM CHLORIDE 0.9 % IJ SOLN
10.0000 mL | INTRAMUSCULAR | Status: DC | PRN
  Filled 2016-05-08: qty 10

## 2016-05-08 NOTE — Patient Instructions (Signed)

## 2016-05-08 NOTE — Progress Notes (Addendum)
[place] OFFICE PROGRESS NOTE   Diagnosis:  Pancreas cancer, diabetes  INTERVAL HISTORY:   Mr. Hilton returns as scheduled. He completed cycle 1 FOLFOX 04/24/2016. He denies nausea/vomiting following the chemotherapy. He did have an episode of nausea/vomiting last night. No diarrhea. No mouth sores. Appetite is poor. He reports increased abdominal pain. No fever. His sister reports he is not checking his blood sugars or taking insulin.  Objective:  Vital signs in last 24 hours:  Blood pressure 101/64, pulse (!) 134, temperature 98.7 F (37.1 C), temperature source Oral, resp. rate 16, height 5\' 7"  (1.702 m), SpO2 100 %.    HEENT: Mild white coating over tongue. No buccal thrush. Resp: Lungs clear bilaterally. Cardio: Regular rate and rhythm. GI: Abdomen is soft and nontender. No organomegaly. Vascular: No leg edema. Port cath without erythema.  Lab Results:  Lab Results  Component Value Date   WBC 7.1 04/24/2016   HGB 8.0 (L) 04/24/2016   HCT 25.5 (L) 04/24/2016   MCV 76.1 (L) 04/24/2016   PLT 309 04/24/2016   NEUTROABS 3.9 04/24/2016    Imaging:  No results found.  Medications: I have reviewed the patient's current medications.  Assessment/Plan: 1. 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 Initiation of gemcitabine/Abraxane every 2 weeks beginning 01/09/2016 ? 02/06/2016 cycle 3 gemcitabine/Abraxane ? 04/11/2016 Restaging CT abdomen/pelvis-interval development of innumerable hepatic metastases measuring up to 14 mm in size; new 12 mm short axis lymph node gastrohepatic ligament; pancreatic head mass measures slightly larger; interval migration of one of the 2 metallic duodenal stents with no evidence for small bowel  obstruction. ? Cycle 1 FOLFOX 04/24/2016 2. Diabetes  3. Peripheral vascular disease, status post stenting of the left subclavian artery and peripheral bypass surgery  4. Cardiomyopathy  5. Leg and foot ulcer secondary to diabetes and peripheral vascular disease-followed at the wound clinic  6. Obstructive jaundice secondary to #1, status post placement of anexternal-internal biliary drain 12/03/2015, stent internalized 12/22/2015; bilirubin normal 01/09/2016  7. Anorexia/weight loss  8. Port-A-Cath placed 12/29/2015  9. Abdominal pain secondary to pancreas cancer  10. Osteomyelitis left lower extremity status post transtibial amputation11/12/2015    Disposition: Mr. Rabb has completed 1 cycle of FOLFOX. His performance status continues to decline. We discussed that in his current condition he is not a candidate for chemotherapy. Dr. Benay Spice recommends a hospice referral. Mr. Sieg is in agreement. End-of-life issues/CODE STATUS discussed at today's visit. Mr. Moodie is not ready to make a decision on CODE STATUS.  He has progressive anemia. He declines a blood transfusion.  He has poorly controlled diabetes. We recommended to him and his family that he check blood sugars routinely and utilize sliding scale insulin as previously prescribed by his PCP.  He will return for a follow-up visit in approximately 2 weeks. He or his family will contact the office in the interim with any problems.  Patient seen with Dr. Benay Spice. 30 minutes were spent face-to-face at today's visit with the majority of that time involved in counseling/coordination of care.    Ned Card ANP/GNP-BC   05/08/2016  10:32 AM This was a shared visit with Ned Card.  Mr. Allemang has a declining performance status.  He is not a candidate for further chemotherapy.  He agrees to a Hospice  referral for home care. We discussed the prognosis and plans with his sister and daughter.   They are in agreement with home Hospice. He may need a long acting narcotic based on evaluation by the hospice team.  Julieanne Manson, MD

## 2016-05-08 NOTE — Patient Instructions (Signed)
Dehydration, Adult Dehydration is when there is not enough fluid or water in your body. This happens when you lose more fluids than you take in. Dehydration can range from mild to very bad. It should be treated right away to keep it from getting very bad. Symptoms of mild dehydration may include:   Thirst.  Dry lips.  Slightly dry mouth.  Dry, warm skin.  Dizziness. Symptoms of moderate dehydration may include:   Very dry mouth.  Muscle cramps.  Dark pee (urine). Pee may be the color of tea.  Your body making less pee.  Your eyes making fewer tears.  Heartbeat that is uneven or faster than normal (palpitations).  Headache.  Light-headedness, especially when you stand up from sitting.  Fainting (syncope). Symptoms of very bad dehydration may include:   Changes in skin, such as:  Cold and clammy skin.  Blotchy (mottled) or pale skin.  Skin that does not quickly return to normal after being lightly pinched and let go (poor skin turgor).  Changes in body fluids, such as:  Feeling very thirsty.  Your eyes making fewer tears.  Not sweating when body temperature is high, such as in hot weather.  Your body making very little pee.  Changes in vital signs, such as:  Weak pulse.  Pulse that is more than 100 beats a minute when you are sitting still.  Fast breathing.  Low blood pressure.  Other changes, such as:  Sunken eyes.  Cold hands and feet.  Confusion.  Lack of energy (lethargy).  Trouble waking up from sleep.  Short-term weight loss.  Unconsciousness. Follow these instructions at home:  If told by your doctor, drink an ORS:  Make an ORS by using instructions on the package.  Start by drinking small amounts, about  cup (120 mL) every 5-10 minutes.  Slowly drink more until you have had the amount that your doctor said to have.  Drink enough clear fluid to keep your pee clear or pale yellow. If you were told to drink an ORS, finish the  ORS first, then start slowly drinking clear fluids. Drink fluids such as:  Water. Do not drink only water by itself. Doing that can make the salt (sodium) level in your body get too low (hyponatremia).  Ice chips.  Fruit juice that you have added water to (diluted).  Low-calorie sports drinks.  Avoid:  Alcohol.  Drinks that have a lot of sugar. These include high-calorie sports drinks, fruit juice that does not have water added, and soda.  Caffeine.  Foods that are greasy or have a lot of fat or sugar.  Take over-the-counter and prescription medicines only as told by your doctor.  Do not take salt tablets. Doing that can make the salt level in your body get too high (hypernatremia).  Eat foods that have minerals (electrolytes). Examples include bananas, oranges, potatoes, tomatoes, and spinach.  Keep all follow-up visits as told by your doctor. This is important. Contact a doctor if:  You have belly (abdominal) pain that:  Gets worse.  Stays in one area (localizes).  You have a rash.  You have a stiff neck.  You get angry or annoyed more easily than normal (irritability).  You are more sleepy than normal.  You have a harder time waking up than normal.  You feel:  Weak.  Dizzy.  Very thirsty.  You have peed (urinated) only a small amount of very dark pee during 6-8 hours. Get help right away if:  You   have symptoms of very bad dehydration.  You cannot drink fluids without throwing up (vomiting).  Your symptoms get worse with treatment.  You have a fever.  You have a very bad headache.  You are throwing up or having watery poop (diarrhea) and it:  Gets worse.  Does not go away.  You have blood or something green (bile) in your throw-up.  You have blood in your poop (stool). This may cause poop to look black and tarry.  You have not peed in 6-8 hours.  You pass out (faint).  Your heart rate when you are sitting still is more than 100 beats a  minute.  You have trouble breathing. This information is not intended to replace advice given to you by your health care provider. Make sure you discuss any questions you have with your health care provider. Document Released: 01/27/2009 Document Revised: 10/21/2015 Document Reviewed: 05/27/2015 Elsevier Interactive Patient Education  2017 [employer]  

## 2016-05-08 NOTE — Progress Notes (Signed)
Hospice referral faxed to [facility].  

## 2016-05-08 NOTE — Progress Notes (Signed)
Nutrition Follow-up:  Unable to see patient in infusion today for nutrition follow-up. Chemotherapy not completed today.  Noted referral being sent to hospice.    Please re-consult RD if needed further.    Donald Steele B. Zenia Resides, [place], [place] (pager)

## 2016-05-18 ENCOUNTER — Telehealth (INDEPENDENT_AMBULATORY_CARE_PROVIDER_SITE_OTHER): Payer: Self-pay | Admitting: *Deleted

## 2016-05-18 ENCOUNTER — Telehealth: Payer: Self-pay | Admitting: Oncology

## 2016-05-18 NOTE — Telephone Encounter (Signed)
Spoke with mom to confirm appointment on 2/8

## 2016-05-18 NOTE — Telephone Encounter (Signed)
Patient's daughter called in this afternoon in regards to whether or not her father should come his appointment for Monday with Dr Sharol Given because he is in Hospice care now? Donald Steele would like a call back at [phone]. Thank you

## 2016-05-21 ENCOUNTER — Ambulatory Visit (INDEPENDENT_AMBULATORY_CARE_PROVIDER_SITE_OTHER): Payer: BLUE CROSS/BLUE SHIELD | Admitting: Orthopedic Surgery

## 2016-05-21 NOTE — Telephone Encounter (Signed)
Called family, patient does not need to follow-up unless there are signs or symptoms of infection. Would have him continue to use the stump shrinker.

## 2016-05-21 NOTE — Telephone Encounter (Signed)
Pt was on the sch for today but daughter questions if appts are needed. Has transitioned to hospice services.

## 2016-05-24 ENCOUNTER — Ambulatory Visit: Payer: BLUE CROSS/BLUE SHIELD | Admitting: Oncology

## 2016-05-24 ENCOUNTER — Telehealth: Payer: Self-pay | Admitting: *Deleted

## 2016-05-24 NOTE — Telephone Encounter (Signed)
Call placed to check on patient's status.  Patient states that he is not having a good day today d/t increased pain.  He states that his hospice nurse will be at his house any minute for a visit.  Patient informed that Dr. Benay Spice is available to see him at any time if appt is needed and to call [facility] with any questions or concerns.  Patient appreciative of call and has no questions at this time.

## 2016-05-31 ENCOUNTER — Ambulatory Visit (INDEPENDENT_AMBULATORY_CARE_PROVIDER_SITE_OTHER): Payer: BLUE CROSS/BLUE SHIELD | Admitting: Orthopedic Surgery

## 2016-06-04 ENCOUNTER — Ambulatory Visit (INDEPENDENT_AMBULATORY_CARE_PROVIDER_SITE_OTHER): Payer: Self-pay | Admitting: Orthopedic Surgery

## 2016-06-22 ENCOUNTER — Telehealth: Payer: Self-pay | Admitting: *Deleted

## 2016-06-27 ENCOUNTER — Telehealth: Payer: Self-pay | Admitting: Medical

## 2016-06-27 NOTE — Telephone Encounter (Signed)
Donald Steele, please sent sympathy card, and I'd like to sign it.      [name] passed away.

## 2016-06-28 ENCOUNTER — Telehealth: Payer: Self-pay | Admitting: Family Medicine

## 2016-06-28 NOTE — Telephone Encounter (Signed)
Sympathy card sent 

## 2016-06-28 NOTE — Telephone Encounter (Signed)
That is ok, we will just go with the one you already sent

## 2016-06-28 NOTE — Telephone Encounter (Signed)
I sent one this morning, when I saw the original note come through.  I will be glad to get another card out and I will sit it on your desk to sign.

## 2016-07-15 NOTE — Telephone Encounter (Signed)
[facility] called to inform MD Benay Spice of patient death: 2016/06/27 @ 7:52 am

## 2016-07-15 DEATH — deceased

## 2016-07-27 ENCOUNTER — Other Ambulatory Visit: Payer: Self-pay | Admitting: Nurse Practitioner

## 2017-03-06 IMAGING — CR DG ANG/EXT/UNI/OR RIGHT
1 series · 1 of 1 positions shown · non-contrast
Comparison: None.

CLINICAL DATA: Right foot ischemic ulcer.

EXAM:
RIGHT WING/EXT/UNI/ OR

[AP]
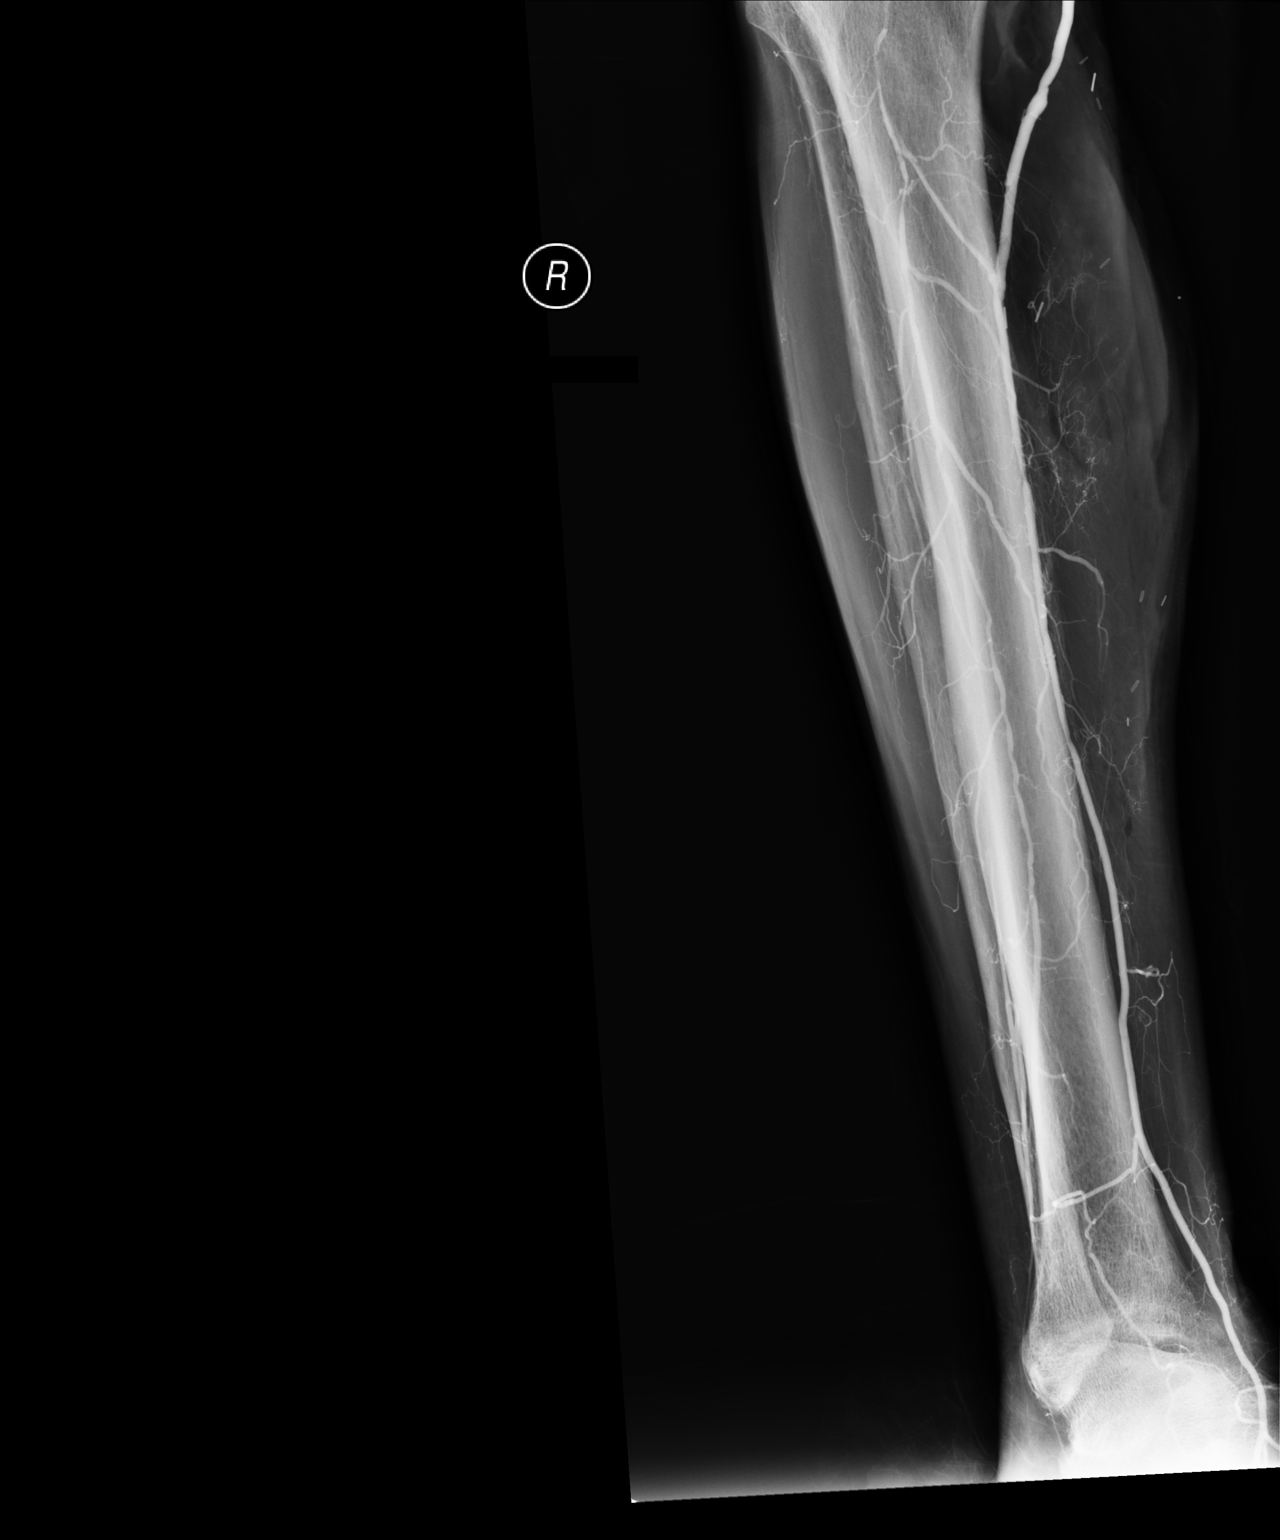

[1 of 1 positions shown; findings below may reference images not displayed]

FINDINGS: Single angiographic image of the right leg was obtained. There is a
bypass graft along the medial aspect of the upper calf. Bypass graft
ties into the posterior tibial artery. Posterior tibial artery is
patent to the ankle but is focal narrowing in the proximal posterior
tibial artery. There is minor filling of the tibioperoneal trunk and
the peroneal artery.
IMPRESSION: Bypass graft and posterior tibial artery are patent.

Focal narrowing in the posterior tibial artery.

## 2017-05-29 ENCOUNTER — Encounter: Payer: Self-pay | Admitting: Cardiology

## 2017-07-28 IMAGING — DX DG ABDOMEN 1V
1 series · 1 of 1 positions shown · non-contrast
Comparison: Fluoroscopic series of December 05, 2015

CLINICAL DATA: Duodenal stent placement

EXAM:
ABDOMEN - 1 VIEW

[abdomen kub]
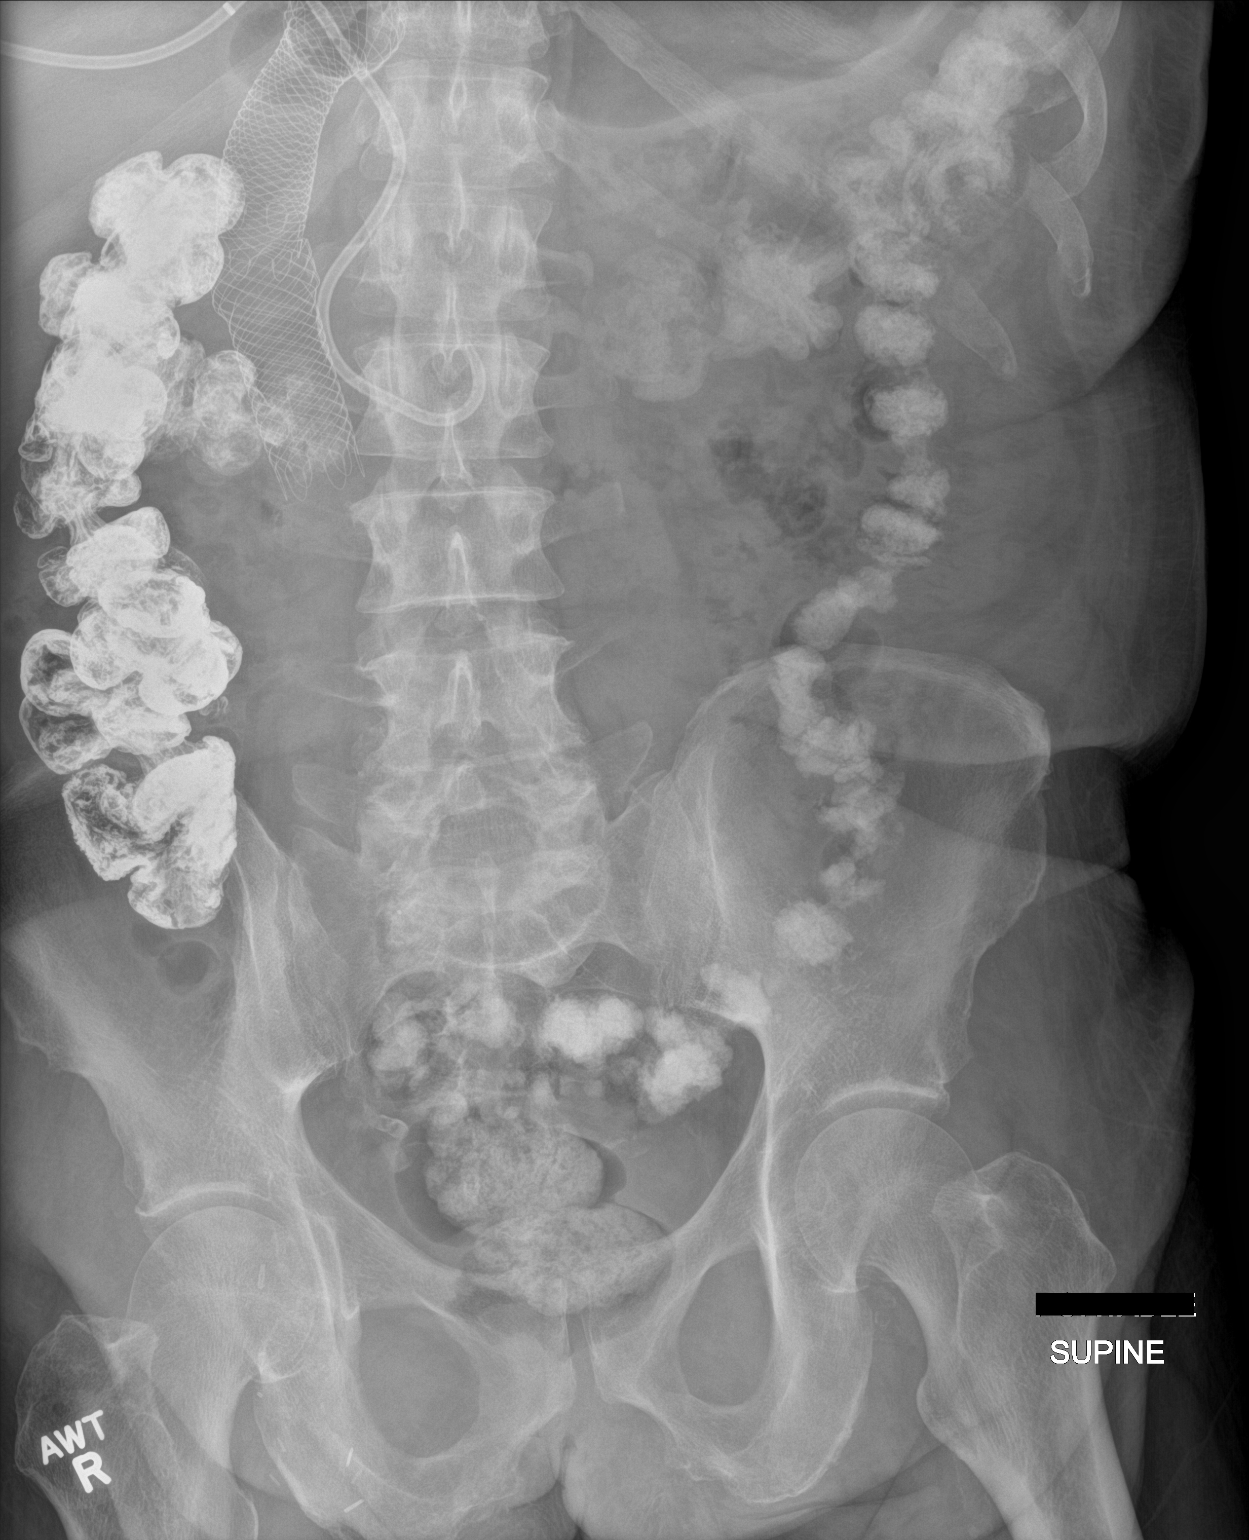

[1 of 1 positions shown; findings below may reference images not displayed]

FINDINGS: A duodenal stent is in place in the expected location of the
descending duodenum. A percutaneous biliary drainage tube is in
place and appears to be in stable position. There is barium within
the colon and rectum. No small or large bowel obstructive pattern is
observed.
IMPRESSION: No evidence of bowel obstruction. No complication observed following
biliary stent placement.

## 2017-09-03 IMAGING — CT CT CHEST W/ CM
2 of 5 series · 14 of 46 positions shown, 16 images · IV contrast (iopamidol)
Comparison: None available.

CLINICAL DATA: Initial evaluation for weight loss, elevated liver
function tests, jaundice.

EXAM:
CT CHEST, ABDOMEN, AND PELVIS WITH CONTRAST
TECHNIQUE: Multidetector CT imaging of the chest, abdomen and pelvis was
performed following the standard protocol during bolus
administration of intravenous contrast.
CONTRAST:  100mL [id number] IOPAMIDOL ([id number]-[id number]) INJECTION 61%

[Series 2: cap with st · axial · 0.86mm/px · z∈[+724,+1284]mm · 11 of 136 slices shown, 13 images]
[im 12/136  soft-tissue]
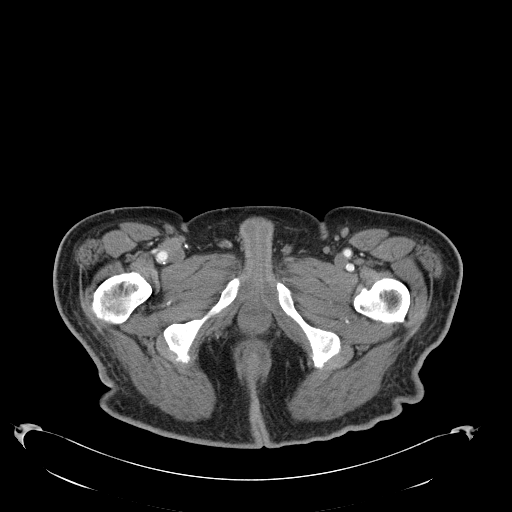
[im 12/136  bone]
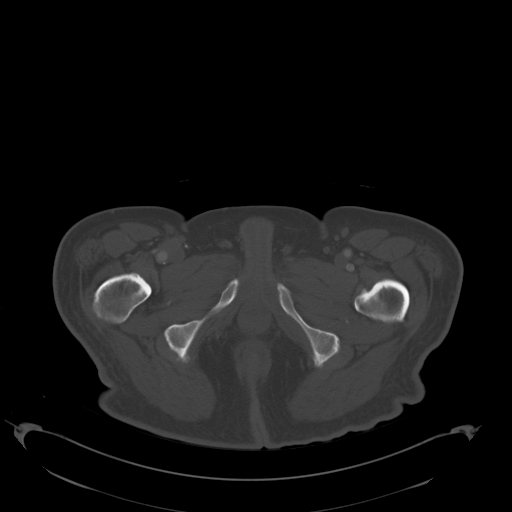
[im 23/136  soft-tissue]
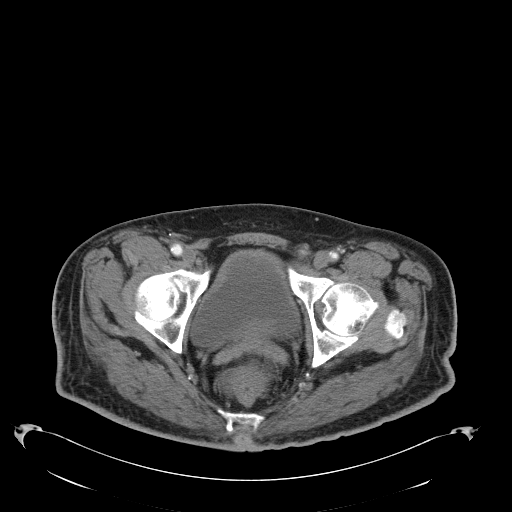
[im 34/136  soft-tissue]
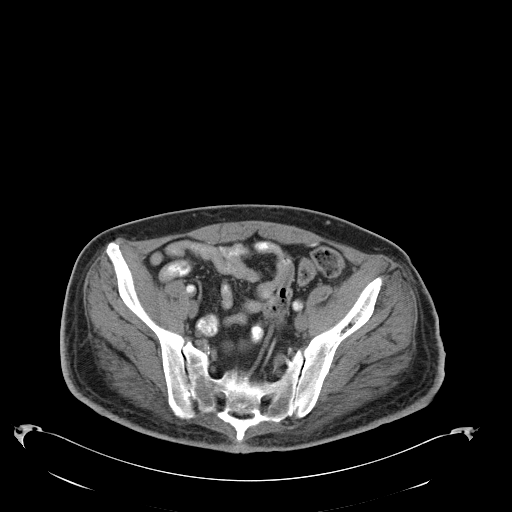
[im 46/136  soft-tissue]
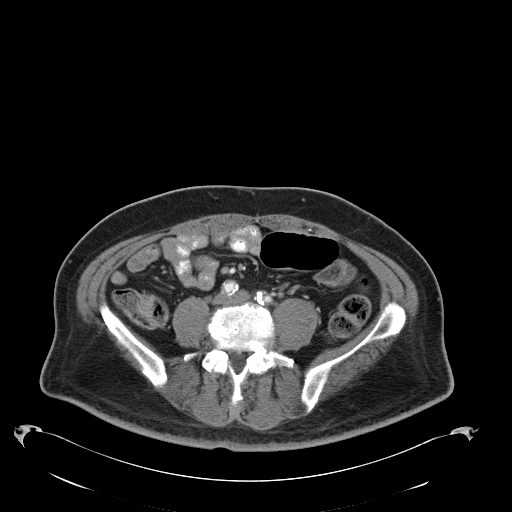
[im 57/136  soft-tissue]
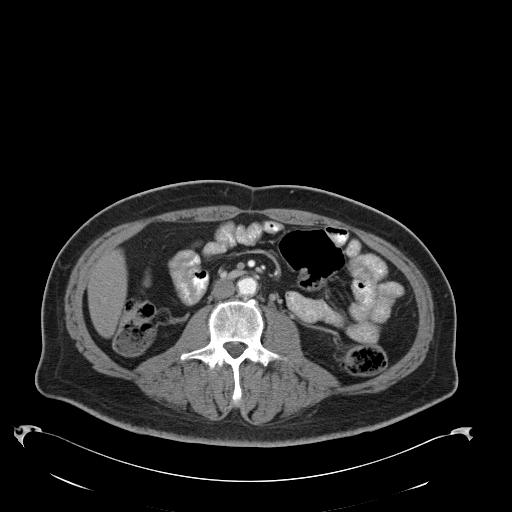
[im 68/136  soft-tissue]
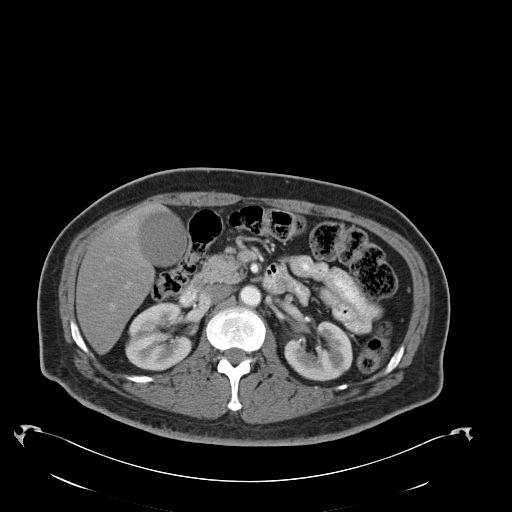
[im 79/136  soft-tissue]
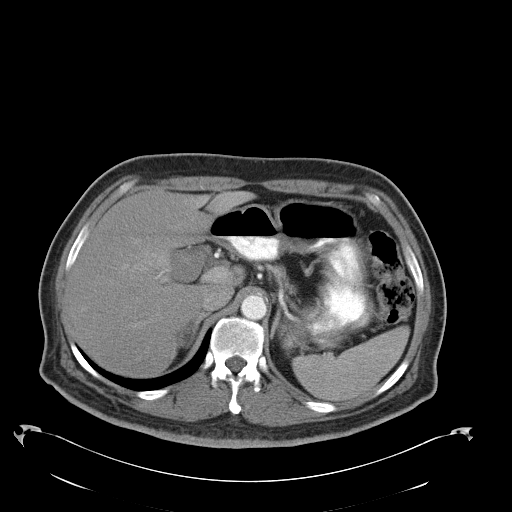
[im 91/136  soft-tissue]
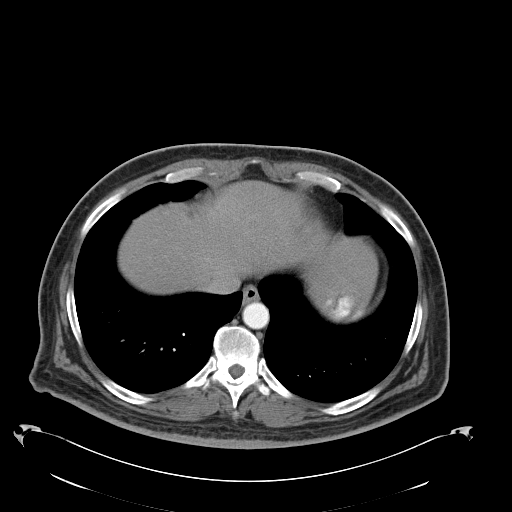
[im 102/136  soft-tissue]
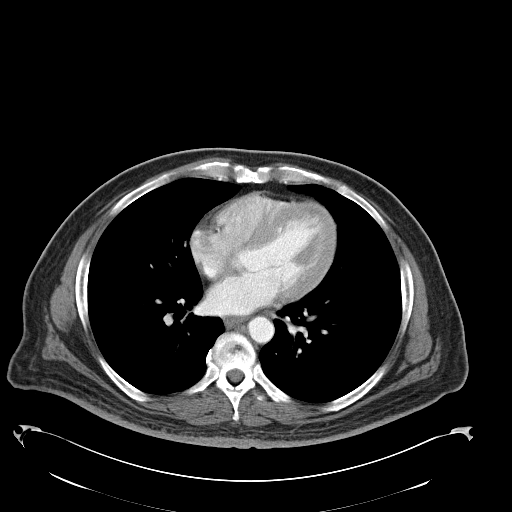
[im 102/136  bone]
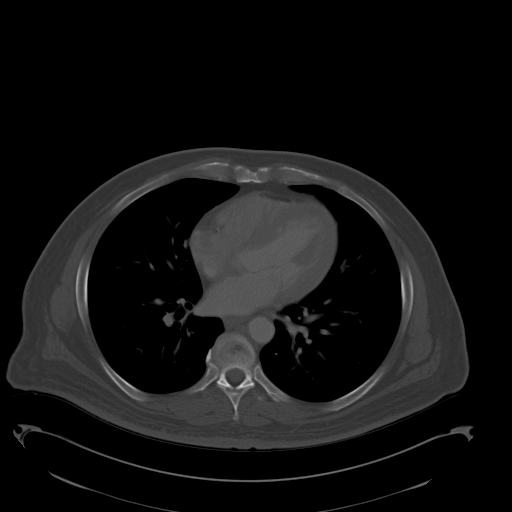
[im 113/136  soft-tissue]
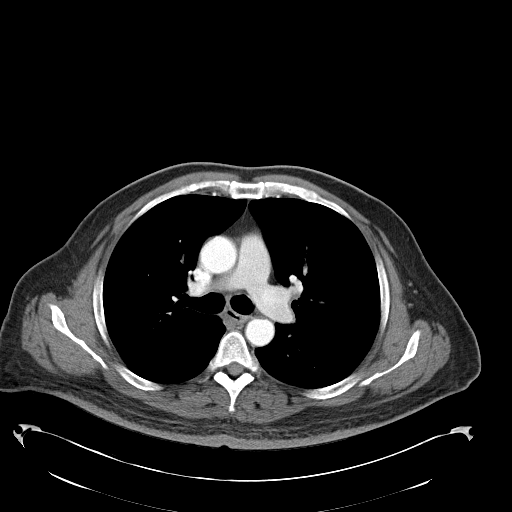
[im 124/136  soft-tissue]
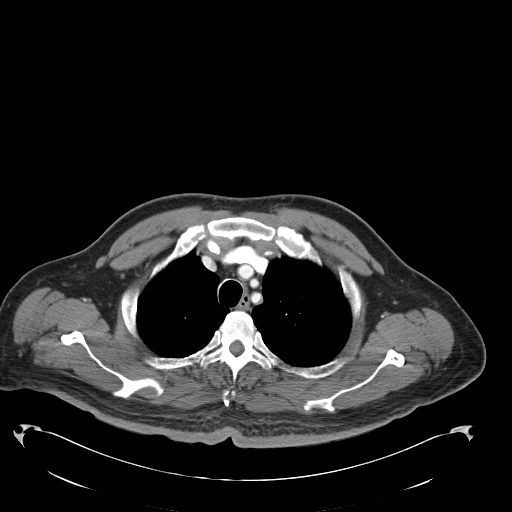

[Series 603: <mpr thick range(1)> · coronal · 1.33mm/px · 3 of 135 slices shown]
[im 45/135  soft-tissue]
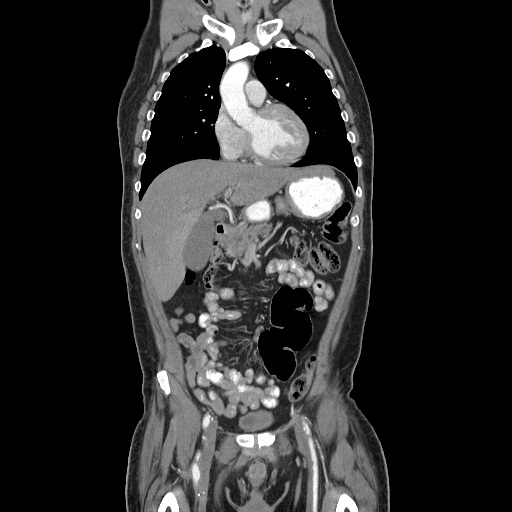
[im 60/135  soft-tissue]
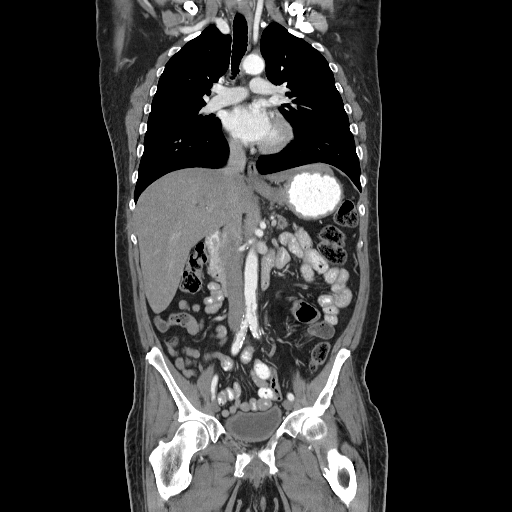
[im 75/135  soft-tissue]
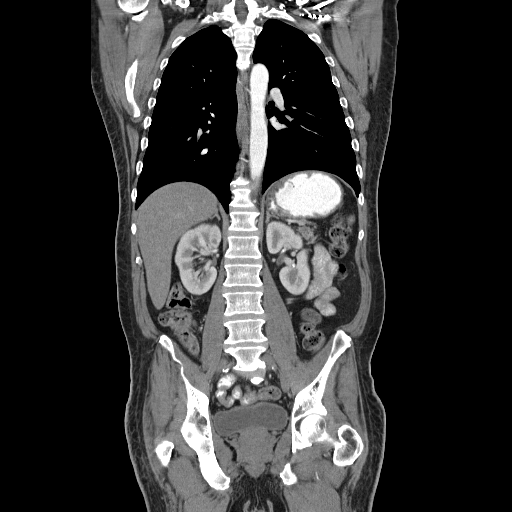

[14 of 46 positions shown; findings below may reference images not displayed]

FINDINGS: CT CHEST FINDINGS

Few scattered subcentimeter hypodense nodules partially visualize
within the thyroid gland, of doubtful clinical significance.
Visualized thyroid otherwise unremarkable.

No pathologically enlarged mediastinal, hilar, or axillary lymph
nodes identified.

Intrathoracic aorta of normal caliber without acute abnormality.
Scattered calcified plaque within the aortic arch in at the origin
of the great vessels. Vascular stent in place at the proximal left
subclavian artery. Flow within the stent appears to be patent. No
high-grade stenosis at the origin of the great vessels.

Heart size within normal limits. Prominent coronary artery
calcifications noted, most evident within the LAD. No pericardial
effusion. Limited evaluation the pulmonary arteries grossly
unremarkable.

The lungs are clear without focal infiltrate, pulmonary edema, or
pleural effusion. No pneumothorax. Single 4 mm nodular density noted
within the superior segment of the right lower lobe (series 4, image
63), indeterminate. Additional 2 mm nodular density along the right
major fissure (series 4, image 53), most consistent with a small
intra pulmonic lymph node. No other worrisome pulmonary nodule or
mass identified.

No acute osseous abnormality. No worrisome lytic or blastic osseous
lesions.

CT ABDOMEN PELVIS FINDINGS

Few scattered cystic lucencies noted within the right hepatic lobe
near the gallbladder fossa, largest of which measures 1.5 x 1.7 cm
(series 2, image 55). These are indeterminate, and may in reflect
dilated biliary ducts. Liver otherwise unremarkable with no other
focal intrahepatic lesions identified.

Gallbladder somewhat hydropic in appearance without acute
inflammatory changes. Common bile duct mildly dilated at 8 mm. Mild
intrahepatic biliary dilatation at the hilum of the liver. No
cholelithiasis or choledocholithiasis identified.

Spleen and adrenal glands within normal limits.

There is a somewhat ill-defined hypodense mass centered at the
pancreatic head which measures approximately 3.1 x 3.9 x 2.9 cm,
highly concerning for primary pancreatic neoplasm. Pancreas at this
level is somewhat ill-defined which shaggy margins. There is
dilatation of the pancreatic duct distally up to 4 mm. Remainder the
pancreatic body and tail are somewhat atrophic in appearance. Mass
closely approximates the gastric antrum/pyloric region anteriorly as
well as the duodenum laterally. Fat about the celiac axis and SMA is
well preserved without frank vascular encasement. No significant
peripancreatic adenopathy.

Kidneys are equal in size with symmetric enhancement. No
nephrolithiasis, hydronephrosis, or focal enhancing renal mass.

Stomach within normal limits. No evidence for bowel obstruction. No
abnormal wall thickening, mucosal enhancement, or inflammatory fat
stranding seen about the bowels. Appendix well visualized in the
right lower quadrant and is normal caliber and appearance without
associated inflammatory changes suggest acute appendicitis. Moderate
amount of retained stool within the colon.

Partially distended bladder within normal limits without acute
abnormality. Prostate within normal limits.

No free intraperitoneal air. No free fluid. No pathologically
enlarged intra-abdominal or pelvic lymph nodes. Postsurgical changes
noted within the right groin. Advanced aorto bi-iliac
atherosclerotic disease. No aneurysm.

No acute osseous abnormality. No worrisome lytic or blastic osseous
lesions.
IMPRESSION: 1. Poorly defined hypodense mass at the pancreatic head, measuring
approximately 3.1 x 3.9 x 2.9 cm, highly concerning for primary
pancreatic neoplasm. Associated mild pancreatic and biliary ductal
dilatation as above.
2. Hypodense lesions at the central aspect of the liver, somewhat
indeterminate, and may reflect focally dilated biliary ducts.
Possible metastases not entirely excluded.
3. Single small 4 mm nodular density within the superior segment of
the right lower lobe, indeterminate. Attention at follow-up
recommended.
4. Prominent coronary artery and aorto bi-iliac atherosclerotic
disease.
